# Patient Record
Sex: Female | Born: 1960 | Race: White | Hispanic: No | Marital: Married | Smoking: Current every day smoker
Health system: Southern US, Community
[De-identification: ages and names within clinical notes are randomized; demographics above are authoritative.]

## PROBLEM LIST (undated history)

## (undated) DIAGNOSIS — K5792 Diverticulitis of intestine, part unspecified, without perforation or abscess without bleeding: Secondary | ICD-10-CM

## (undated) DIAGNOSIS — J449 Chronic obstructive pulmonary disease, unspecified: Secondary | ICD-10-CM

## (undated) DIAGNOSIS — K589 Irritable bowel syndrome without diarrhea: Secondary | ICD-10-CM

## (undated) DIAGNOSIS — K859 Acute pancreatitis without necrosis or infection, unspecified: Secondary | ICD-10-CM

## (undated) DIAGNOSIS — G894 Chronic pain syndrome: Secondary | ICD-10-CM

## (undated) DIAGNOSIS — D72829 Elevated white blood cell count, unspecified: Secondary | ICD-10-CM

## (undated) DIAGNOSIS — K5720 Diverticulitis of large intestine with perforation and abscess without bleeding: Secondary | ICD-10-CM

## (undated) DIAGNOSIS — D75839 Thrombocytosis, unspecified: Secondary | ICD-10-CM

## (undated) DIAGNOSIS — I509 Heart failure, unspecified: Secondary | ICD-10-CM

## (undated) DIAGNOSIS — K029 Dental caries, unspecified: Secondary | ICD-10-CM

## (undated) DIAGNOSIS — K219 Gastro-esophageal reflux disease without esophagitis: Secondary | ICD-10-CM

## (undated) DIAGNOSIS — D473 Essential (hemorrhagic) thrombocythemia: Secondary | ICD-10-CM

## (undated) DIAGNOSIS — R1310 Dysphagia, unspecified: Secondary | ICD-10-CM

## (undated) DIAGNOSIS — Z630 Problems in relationship with spouse or partner: Secondary | ICD-10-CM

## (undated) DIAGNOSIS — R1115 Cyclical vomiting syndrome unrelated to migraine: Secondary | ICD-10-CM

## (undated) DIAGNOSIS — I10 Essential (primary) hypertension: Secondary | ICD-10-CM

## (undated) DIAGNOSIS — D649 Anemia, unspecified: Secondary | ICD-10-CM

## (undated) DIAGNOSIS — Z1211 Encounter for screening for malignant neoplasm of colon: Secondary | ICD-10-CM

## (undated) DIAGNOSIS — Z8619 Personal history of other infectious and parasitic diseases: Secondary | ICD-10-CM

## (undated) DIAGNOSIS — R109 Unspecified abdominal pain: Secondary | ICD-10-CM

## (undated) DIAGNOSIS — R3129 Other microscopic hematuria: Secondary | ICD-10-CM

## (undated) DIAGNOSIS — E876 Hypokalemia: Secondary | ICD-10-CM

## (undated) DIAGNOSIS — R000 Tachycardia, unspecified: Secondary | ICD-10-CM

## (undated) DIAGNOSIS — F419 Anxiety disorder, unspecified: Secondary | ICD-10-CM

## (undated) DIAGNOSIS — E559 Vitamin D deficiency, unspecified: Secondary | ICD-10-CM

## (undated) DIAGNOSIS — F322 Major depressive disorder, single episode, severe without psychotic features: Secondary | ICD-10-CM

## (undated) DIAGNOSIS — D125 Benign neoplasm of sigmoid colon: Secondary | ICD-10-CM

## (undated) DIAGNOSIS — J189 Pneumonia, unspecified organism: Secondary | ICD-10-CM

## (undated) DIAGNOSIS — M8580 Other specified disorders of bone density and structure, unspecified site: Secondary | ICD-10-CM

## (undated) DIAGNOSIS — Z1239 Encounter for other screening for malignant neoplasm of breast: Secondary | ICD-10-CM

## (undated) DIAGNOSIS — F331 Major depressive disorder, recurrent, moderate: Secondary | ICD-10-CM

## (undated) DIAGNOSIS — Z8701 Personal history of pneumonia (recurrent): Secondary | ICD-10-CM

## (undated) DIAGNOSIS — J019 Acute sinusitis, unspecified: Secondary | ICD-10-CM

## (undated) HISTORY — DX: Problems in relationship with spouse or partner: Z63.0

## (undated) HISTORY — DX: Diverticulitis of intestine, part unspecified, without perforation or abscess without bleeding: K57.92

## (undated) HISTORY — DX: Anemia, unspecified: D64.9

## (undated) HISTORY — DX: Personal history of other infectious and parasitic diseases: Z86.19

## (undated) HISTORY — DX: Chronic obstructive pulmonary disease, unspecified: J44.9

## (undated) HISTORY — DX: Encounter for screening for malignant neoplasm of colon: Z12.11

## (undated) HISTORY — DX: Unspecified abdominal pain: R10.9

## (undated) HISTORY — DX: Irritable bowel syndrome without diarrhea: K58.9

## (undated) HISTORY — DX: Cyclical vomiting syndrome unrelated to migraine: R11.15

## (undated) HISTORY — DX: Essential (hemorrhagic) thrombocythemia: D47.3

## (undated) HISTORY — DX: Tachycardia, unspecified: R00.0

## (undated) HISTORY — DX: Anxiety disorder, unspecified: F41.9

## (undated) HISTORY — DX: Personal history of pneumonia (recurrent): Z87.01

## (undated) HISTORY — DX: Benign neoplasm of sigmoid colon: D12.5

## (undated) HISTORY — DX: Dental caries, unspecified: K02.9

## (undated) HISTORY — DX: Thrombocytosis, unspecified: D75.839

## (undated) HISTORY — DX: Other microscopic hematuria: R31.29

## (undated) HISTORY — DX: Elevated white blood cell count, unspecified: D72.829

## (undated) HISTORY — DX: Major depressive disorder, single episode, severe without psychotic features: F32.2

## (undated) HISTORY — DX: Acute pancreatitis without necrosis or infection, unspecified: K85.90

## (undated) HISTORY — DX: Heart failure, unspecified: I50.9

## (undated) HISTORY — DX: Encounter for other screening for malignant neoplasm of breast: Z12.39

## (undated) HISTORY — DX: Other specified disorders of bone density and structure, unspecified site: M85.80

## (undated) HISTORY — DX: Chronic pain syndrome: G89.4

## (undated) HISTORY — DX: Hypokalemia: E87.6

## (undated) HISTORY — DX: Diverticulitis of large intestine with perforation and abscess without bleeding: K57.20

## (undated) HISTORY — DX: Gastro-esophageal reflux disease without esophagitis: K21.9

## (undated) HISTORY — DX: Acute sinusitis, unspecified: J01.90

## (undated) HISTORY — DX: Vitamin D deficiency, unspecified: E55.9

## (undated) HISTORY — DX: Major depressive disorder, recurrent, moderate: F33.1

## (undated) HISTORY — DX: Dysphagia, unspecified: R13.10

## (undated) HISTORY — DX: Essential (primary) hypertension: I10

---

## 2006-03-22 ENCOUNTER — Emergency Department: Payer: Self-pay

## 2006-07-02 ENCOUNTER — Ambulatory Visit: Payer: Self-pay | Admitting: Family Medicine

## 2009-04-18 ENCOUNTER — Ambulatory Visit: Payer: Self-pay | Admitting: Cardiovascular Disease

## 2009-04-18 ENCOUNTER — Inpatient Hospital Stay: Payer: Self-pay | Admitting: Student

## 2009-04-25 ENCOUNTER — Emergency Department: Payer: Self-pay | Admitting: Emergency Medicine

## 2009-12-03 ENCOUNTER — Emergency Department: Payer: Self-pay | Admitting: Emergency Medicine

## 2010-02-02 ENCOUNTER — Emergency Department: Payer: Self-pay | Admitting: Internal Medicine

## 2010-04-27 ENCOUNTER — Emergency Department: Payer: Self-pay | Admitting: Internal Medicine

## 2011-03-10 ENCOUNTER — Emergency Department: Payer: Self-pay | Admitting: Emergency Medicine

## 2011-03-11 ENCOUNTER — Emergency Department: Payer: Self-pay | Admitting: Emergency Medicine

## 2013-03-22 ENCOUNTER — Emergency Department: Payer: Self-pay | Admitting: Emergency Medicine

## 2013-08-30 ENCOUNTER — Emergency Department: Payer: Self-pay | Admitting: Internal Medicine

## 2013-12-01 ENCOUNTER — Ambulatory Visit: Payer: Self-pay | Admitting: Internal Medicine

## 2013-12-30 ENCOUNTER — Inpatient Hospital Stay: Payer: Self-pay | Admitting: Internal Medicine

## 2013-12-30 LAB — CBC
HCT: 42.2 % (ref 35.0–47.0)
HGB: 14.2 g/dL (ref 12.0–16.0)
MCH: 30.7 pg (ref 26.0–34.0)
MCHC: 33.6 g/dL (ref 32.0–36.0)
MCV: 91 fL (ref 80–100)
Platelet: 111 10*3/uL — ABNORMAL LOW (ref 150–440)
RBC: 4.62 10*6/uL (ref 3.80–5.20)
RDW: 15.8 % — ABNORMAL HIGH (ref 11.5–14.5)
WBC: 1.8 10*3/uL — CL (ref 3.6–11.0)

## 2013-12-30 LAB — COMPREHENSIVE METABOLIC PANEL
AST: 89 U/L — AB (ref 15–37)
Albumin: 2.4 g/dL — ABNORMAL LOW (ref 3.4–5.0)
Alkaline Phosphatase: 89 U/L
Anion Gap: 11 (ref 7–16)
BILIRUBIN TOTAL: 0.7 mg/dL (ref 0.2–1.0)
BUN: 52 mg/dL — ABNORMAL HIGH (ref 7–18)
CALCIUM: 8.2 mg/dL — AB (ref 8.5–10.1)
CHLORIDE: 106 mmol/L (ref 98–107)
CO2: 18 mmol/L — AB (ref 21–32)
Creatinine: 2.15 mg/dL — ABNORMAL HIGH (ref 0.60–1.30)
EGFR (African American): 29 — ABNORMAL LOW
GFR CALC NON AF AMER: 25 — AB
Glucose: 78 mg/dL (ref 65–99)
Osmolality: 283 (ref 275–301)
Potassium: 3 mmol/L — ABNORMAL LOW (ref 3.5–5.1)
SGPT (ALT): 43 U/L (ref 12–78)
Sodium: 135 mmol/L — ABNORMAL LOW (ref 136–145)
Total Protein: 6.7 g/dL (ref 6.4–8.2)

## 2013-12-30 LAB — DIFFERENTIAL
BASOS PCT: 0.3 %
Basophil #: 0 10*3/uL (ref 0.0–0.1)
Eosinophil #: 0 10*3/uL (ref 0.0–0.7)
Eosinophil %: 0.1 %
Lymphocyte #: 0 10*3/uL — ABNORMAL LOW (ref 1.0–3.6)
Lymphocyte %: 2.5 %
Monocyte #: 0 x10 3/mm — ABNORMAL LOW (ref 0.2–0.9)
Monocyte %: 0.2 %
Neutrophil #: 1.8 10*3/uL (ref 1.4–6.5)
Neutrophil %: 96.9 %

## 2013-12-30 LAB — CK TOTAL AND CKMB (NOT AT ARMC)
CK, Total: 18 U/L — ABNORMAL LOW
CK-MB: <0.5 ng/mL — ABNORMAL LOW (ref 0.5–3.6)

## 2013-12-30 LAB — TROPONIN I: Troponin-I: <0.02 ng/mL

## 2013-12-30 LAB — MAGNESIUM: MAGNESIUM: 2.5 mg/dL — AB

## 2013-12-31 LAB — FOLATE: Folic Acid: 7.8 ng/mL (ref 3.1–100.0)

## 2013-12-31 LAB — BASIC METABOLIC PANEL
Anion Gap: 5 — ABNORMAL LOW (ref 7–16)
BUN: 46 mg/dL — ABNORMAL HIGH (ref 7–18)
CREATININE: 1.6 mg/dL — AB (ref 0.60–1.30)
Calcium, Total: 7.5 mg/dL — ABNORMAL LOW (ref 8.5–10.1)
Chloride: 112 mmol/L — ABNORMAL HIGH (ref 98–107)
Co2: 21 mmol/L (ref 21–32)
GFR CALC AF AMER: 42 — AB
GFR CALC NON AF AMER: 36 — AB
GLUCOSE: 48 mg/dL — AB (ref 65–99)
OSMOLALITY: 285 (ref 275–301)
Potassium: 3.8 mmol/L (ref 3.5–5.1)
SODIUM: 138 mmol/L (ref 136–145)

## 2013-12-31 LAB — CBC WITH DIFFERENTIAL/PLATELET
HCT: 41.3 % (ref 35.0–47.0)
HGB: 13.5 g/dL (ref 12.0–16.0)
LYMPHS PCT: 10 %
MCH: 30.4 pg (ref 26.0–34.0)
MCHC: 32.6 g/dL (ref 32.0–36.0)
MCV: 93 fL (ref 80–100)
Monocytes: 3 %
PLATELETS: 81 10*3/uL — AB (ref 150–440)
RBC: 4.44 10*6/uL (ref 3.80–5.20)
RDW: 16 % — ABNORMAL HIGH (ref 11.5–14.5)
SEGMENTED NEUTROPHILS: 87 %
WBC: 3 10*3/uL — AB (ref 3.6–11.0)

## 2013-12-31 LAB — RETICULOCYTES
Absolute Retic Count: 0.0406 10*6/uL (ref 0.019–0.186)
RETICULOCYTE: 0.91 % (ref 0.4–3.1)

## 2013-12-31 LAB — APTT: Activated PTT: 30.4 secs (ref 23.6–35.9)

## 2013-12-31 LAB — MAGNESIUM: Magnesium: 2.4 mg/dL

## 2013-12-31 LAB — IRON AND TIBC
IRON BIND. CAP.(TOTAL): 182 ug/dL — AB (ref 250–450)
IRON SATURATION: 7 %
Iron: 12 ug/dL — ABNORMAL LOW (ref 50–170)
UNBOUND IRON-BIND. CAP.: 170 ug/dL

## 2013-12-31 LAB — FIBRINOGEN

## 2013-12-31 LAB — PROTIME-INR
INR: 1.1
Prothrombin Time: 14.5 secs (ref 11.5–14.7)

## 2013-12-31 LAB — LACTATE DEHYDROGENASE: LDH: 258 U/L — ABNORMAL HIGH (ref 81–246)

## 2014-01-01 ENCOUNTER — Ambulatory Visit: Payer: Self-pay | Admitting: Internal Medicine

## 2014-01-01 LAB — CBC WITH DIFFERENTIAL/PLATELET
BASOS ABS: 0 10*3/uL (ref 0.0–0.1)
BASOS ABS: 0 10*3/uL (ref 0.0–0.1)
BASOS PCT: 0.1 %
Basophil %: 0.1 %
EOS ABS: 0 10*3/uL (ref 0.0–0.7)
EOS PCT: 0 %
Eosinophil #: 0 10*3/uL (ref 0.0–0.7)
Eosinophil %: 0 %
HCT: 36.1 % (ref 35.0–47.0)
HCT: 37.8 % (ref 35.0–47.0)
HGB: 12 g/dL (ref 12.0–16.0)
HGB: 12.7 g/dL (ref 12.0–16.0)
LYMPHS ABS: 0.1 10*3/uL — AB (ref 1.0–3.6)
LYMPHS ABS: 0.1 10*3/uL — AB (ref 1.0–3.6)
Lymphocyte %: 0.4 %
Lymphocyte %: 0.6 %
MCH: 31.1 pg (ref 26.0–34.0)
MCH: 30.9 pg (ref 26.0–34.0)
MCHC: 33.3 g/dL (ref 32.0–36.0)
MCHC: 33.5 g/dL (ref 32.0–36.0)
MCV: 93 fL (ref 80–100)
MCV: 92 fL (ref 80–100)
MONOS PCT: 0.8 %
MONOS PCT: 0.6 %
Monocyte #: 0.1 x10 3/mm — ABNORMAL LOW (ref 0.2–0.9)
Monocyte #: 0.1 x10 3/mm — ABNORMAL LOW (ref 0.2–0.9)
NEUTROS ABS: 18.8 10*3/uL — AB (ref 1.4–6.5)
Neutrophil #: 15.7 10*3/uL — ABNORMAL HIGH (ref 1.4–6.5)
Neutrophil %: 98.7 %
Neutrophil %: 98.7 %
PLATELETS: 47 10*3/uL — AB (ref 150–440)
PLATELETS: 51 10*3/uL — AB (ref 150–440)
RBC: 3.87 10*6/uL (ref 3.80–5.20)
RBC: 4.1 10*6/uL (ref 3.80–5.20)
RDW: 16.4 % — ABNORMAL HIGH (ref 11.5–14.5)
RDW: 16.1 % — ABNORMAL HIGH (ref 11.5–14.5)
WBC: 15.9 10*3/uL — AB (ref 3.6–11.0)
WBC: 19 10*3/uL — ABNORMAL HIGH (ref 3.6–11.0)

## 2014-01-01 LAB — BASIC METABOLIC PANEL
Anion Gap: 7 (ref 7–16)
BUN: 39 mg/dL — AB (ref 7–18)
CALCIUM: 7.7 mg/dL — AB (ref 8.5–10.1)
CHLORIDE: 114 mmol/L — AB (ref 98–107)
CO2: 19 mmol/L — AB (ref 21–32)
CREATININE: 1.26 mg/dL (ref 0.60–1.30)
EGFR (Non-African Amer.): 48 — ABNORMAL LOW
GFR CALC AF AMER: 56 — AB
GLUCOSE: 120 mg/dL — AB (ref 65–99)
OSMOLALITY: 290 (ref 275–301)
POTASSIUM: 2.9 mmol/L — AB (ref 3.5–5.1)
Sodium: 140 mmol/L (ref 136–145)

## 2014-01-01 LAB — POTASSIUM: Potassium: 3.5 mmol/L (ref 3.5–5.1)

## 2014-01-02 LAB — BASIC METABOLIC PANEL
Anion Gap: 11 (ref 7–16)
Anion Gap: 14 (ref 7–16)
BUN: 43 mg/dL — ABNORMAL HIGH (ref 7–18)
BUN: 52 mg/dL — ABNORMAL HIGH (ref 7–18)
CALCIUM: 8.5 mg/dL (ref 8.5–10.1)
CHLORIDE: 105 mmol/L (ref 98–107)
Calcium, Total: 7.8 mg/dL — ABNORMAL LOW (ref 8.5–10.1)
Chloride: 111 mmol/L — ABNORMAL HIGH (ref 98–107)
Co2: 17 mmol/L — ABNORMAL LOW (ref 21–32)
Co2: 19 mmol/L — ABNORMAL LOW (ref 21–32)
Creatinine: 1.31 mg/dL — ABNORMAL HIGH (ref 0.60–1.30)
Creatinine: 1.22 mg/dL (ref 0.60–1.30)
EGFR (African American): 53 — ABNORMAL LOW
EGFR (African American): 58 — ABNORMAL LOW
EGFR (Non-African Amer.): 46 — ABNORMAL LOW
EGFR (Non-African Amer.): 50 — ABNORMAL LOW
GLUCOSE: 57 mg/dL — AB (ref 65–99)
GLUCOSE: 200 mg/dL — AB (ref 65–99)
OSMOLALITY: 286 (ref 275–301)
Osmolality: 295 (ref 275–301)
POTASSIUM: 2.5 mmol/L — AB (ref 3.5–5.1)
Potassium: 3.3 mmol/L — ABNORMAL LOW (ref 3.5–5.1)
Sodium: 139 mmol/L (ref 136–145)
Sodium: 138 mmol/L (ref 136–145)

## 2014-01-02 LAB — WBC: WBC: 24.5 10*3/uL — ABNORMAL HIGH (ref 3.6–11.0)

## 2014-01-02 LAB — POTASSIUM: POTASSIUM: 2.6 mmol/L — AB (ref 3.5–5.1)

## 2014-01-02 LAB — CULTURE, BLOOD (SINGLE)

## 2014-01-02 LAB — PLATELET COUNT: PLATELETS: 54 10*3/uL — AB (ref 150–440)

## 2014-01-03 LAB — FIBRIN DEGRADATION PROD.(ARMC ONLY)

## 2014-01-03 LAB — CBC WITH DIFFERENTIAL/PLATELET
BASOS ABS: 0 10*3/uL (ref 0.0–0.1)
BASOS ABS: 0 10*3/uL (ref 0.0–0.1)
BASOS PCT: 0.1 %
Basophil %: 0.2 %
EOS ABS: 0 10*3/uL (ref 0.0–0.7)
Eosinophil #: 0 10*3/uL (ref 0.0–0.7)
Eosinophil %: 0 %
Eosinophil %: 0 %
HCT: 30.3 % — ABNORMAL LOW (ref 35.0–47.0)
HCT: 28.4 % — AB (ref 35.0–47.0)
HGB: 10.4 g/dL — ABNORMAL LOW (ref 12.0–16.0)
HGB: 9.4 g/dL — ABNORMAL LOW (ref 12.0–16.0)
Lymphocyte #: 0.2 10*3/uL — ABNORMAL LOW (ref 1.0–3.6)
Lymphocyte #: 0.2 10*3/uL — ABNORMAL LOW (ref 1.0–3.6)
Lymphocyte %: 0.6 %
Lymphocyte %: 0.7 %
MCH: 30.6 pg (ref 26.0–34.0)
MCH: 29.8 pg (ref 26.0–34.0)
MCHC: 34.2 g/dL (ref 32.0–36.0)
MCHC: 33.2 g/dL (ref 32.0–36.0)
MCV: 89 fL (ref 80–100)
MCV: 90 fL (ref 80–100)
MONOS PCT: 0.6 %
MONOS PCT: 1 %
Monocyte #: 0.2 x10 3/mm (ref 0.2–0.9)
Monocyte #: 0.3 x10 3/mm (ref 0.2–0.9)
NEUTROS ABS: 31.2 10*3/uL — AB (ref 1.4–6.5)
NEUTROS ABS: 25.4 10*3/uL — AB (ref 1.4–6.5)
Neutrophil %: 98.7 %
Neutrophil %: 98.1 %
PLATELETS: 29 10*3/uL — AB (ref 150–440)
Platelet: 22 10*3/uL — CL (ref 150–440)
RBC: 3.39 10*6/uL — AB (ref 3.80–5.20)
RBC: 3.17 10*6/uL — ABNORMAL LOW (ref 3.80–5.20)
RDW: 16.5 % — ABNORMAL HIGH (ref 11.5–14.5)
RDW: 16.1 % — ABNORMAL HIGH (ref 11.5–14.5)
WBC: 31.6 10*3/uL — ABNORMAL HIGH (ref 3.6–11.0)
WBC: 25.9 10*3/uL — ABNORMAL HIGH (ref 3.6–11.0)

## 2014-01-03 LAB — PHOSPHORUS
Phosphorus: 2.1 mg/dL — ABNORMAL LOW (ref 2.5–4.9)
Phosphorus: 2.6 mg/dL (ref 2.5–4.9)

## 2014-01-03 LAB — VANCOMYCIN, TROUGH: VANCOMYCIN, TROUGH: 8 ug/mL — AB (ref 10–20)

## 2014-01-03 LAB — POTASSIUM: Potassium: 3.2 mmol/L — ABNORMAL LOW (ref 3.5–5.1)

## 2014-01-03 LAB — BASIC METABOLIC PANEL
Anion Gap: 6 — ABNORMAL LOW (ref 7–16)
BUN: 47 mg/dL — ABNORMAL HIGH (ref 7–18)
CHLORIDE: 103 mmol/L (ref 98–107)
Calcium, Total: 7.7 mg/dL — ABNORMAL LOW (ref 8.5–10.1)
Co2: 29 mmol/L (ref 21–32)
Creatinine: 1.36 mg/dL — ABNORMAL HIGH (ref 0.60–1.30)
GFR CALC AF AMER: 51 — AB
GFR CALC NON AF AMER: 44 — AB
Glucose: 266 mg/dL — ABNORMAL HIGH (ref 65–99)
Osmolality: 297 (ref 275–301)
Potassium: 2.8 mmol/L — ABNORMAL LOW (ref 3.5–5.1)
Sodium: 138 mmol/L (ref 136–145)

## 2014-01-03 LAB — D-DIMER(ARMC): D-Dimer: 3494 ng/ml

## 2014-01-03 LAB — FIBRINOGEN
Fibrinogen: >750 mg/dL — ABNORMAL HIGH (ref 210–470)
Fibrinogen: >750 mg/dL — ABNORMAL HIGH (ref 210–470)

## 2014-01-03 LAB — APTT: ACTIVATED PTT: 25.4 s (ref 23.6–35.9)

## 2014-01-03 LAB — MAGNESIUM
MAGNESIUM: 1.7 mg/dL — AB
Magnesium: 2.2 mg/dL

## 2014-01-03 LAB — PROTIME-INR
INR: 1.2
Prothrombin Time: 15.3 secs — ABNORMAL HIGH (ref 11.5–14.7)

## 2014-01-03 LAB — PROT IMMUNOELECTROPHORES(ARMC)

## 2014-01-04 LAB — CBC WITH DIFFERENTIAL/PLATELET
BASOS ABS: 0 10*3/uL (ref 0.0–0.1)
Basophil %: 0.1 %
EOS ABS: 0 10*3/uL (ref 0.0–0.7)
Eosinophil %: 0 %
HCT: 28.4 % — ABNORMAL LOW (ref 35.0–47.0)
HGB: 9.3 g/dL — ABNORMAL LOW (ref 12.0–16.0)
LYMPHS ABS: 0.3 10*3/uL — AB (ref 1.0–3.6)
Lymphocyte %: 1.2 %
MCH: 29.5 pg (ref 26.0–34.0)
MCHC: 32.6 g/dL (ref 32.0–36.0)
MCV: 91 fL (ref 80–100)
MONO ABS: 0.4 x10 3/mm (ref 0.2–0.9)
Monocyte %: 1.5 %
Neutrophil #: 23 10*3/uL — ABNORMAL HIGH (ref 1.4–6.5)
Neutrophil %: 97.2 %
Platelet: 32 10*3/uL — ABNORMAL LOW (ref 150–440)
RBC: 3.14 10*6/uL — ABNORMAL LOW (ref 3.80–5.20)
RDW: 16.2 % — AB (ref 11.5–14.5)
WBC: 23.7 10*3/uL — ABNORMAL HIGH (ref 3.6–11.0)

## 2014-01-04 LAB — CULTURE, BLOOD (SINGLE)

## 2014-01-04 LAB — BASIC METABOLIC PANEL
Anion Gap: 5 — ABNORMAL LOW (ref 7–16)
BUN: 31 mg/dL — ABNORMAL HIGH (ref 7–18)
CREATININE: 0.91 mg/dL (ref 0.60–1.30)
Calcium, Total: 7.5 mg/dL — ABNORMAL LOW (ref 8.5–10.1)
Chloride: 94 mmol/L — ABNORMAL LOW (ref 98–107)
Co2: 38 mmol/L — ABNORMAL HIGH (ref 21–32)
EGFR (African American): >60
EGFR (Non-African Amer.): >60
Glucose: 171 mg/dL — ABNORMAL HIGH (ref 65–99)
OSMOLALITY: 284 (ref 275–301)
Potassium: 3.5 mmol/L (ref 3.5–5.1)
Sodium: 137 mmol/L (ref 136–145)

## 2014-01-04 LAB — PHOSPHORUS: PHOSPHORUS: 2.7 mg/dL (ref 2.5–4.9)

## 2014-01-05 LAB — BASIC METABOLIC PANEL
ANION GAP: 1 — AB (ref 7–16)
BUN: 28 mg/dL — AB (ref 7–18)
CHLORIDE: 100 mmol/L (ref 98–107)
CO2: 39 mmol/L — AB (ref 21–32)
Calcium, Total: 7.4 mg/dL — ABNORMAL LOW (ref 8.5–10.1)
Creatinine: 0.85 mg/dL (ref 0.60–1.30)
EGFR (Non-African Amer.): >60
GLUCOSE: 118 mg/dL — AB (ref 65–99)
OSMOLALITY: 286 (ref 275–301)
Potassium: 3.5 mmol/L (ref 3.5–5.1)
Sodium: 140 mmol/L (ref 136–145)

## 2014-01-05 LAB — CBC WITH DIFFERENTIAL/PLATELET
Bands: 4 %
HCT: 26 % — AB (ref 35.0–47.0)
HGB: 8.8 g/dL — ABNORMAL LOW (ref 12.0–16.0)
LYMPHS PCT: 6 %
MCH: 30.6 pg (ref 26.0–34.0)
MCHC: 33.8 g/dL (ref 32.0–36.0)
MCV: 90 fL (ref 80–100)
METAMYELOCYTE: 1 %
Monocytes: 3 %
Platelet: 63 10*3/uL — ABNORMAL LOW (ref 150–440)
RBC: 2.88 10*6/uL — AB (ref 3.80–5.20)
RDW: 16.2 % — ABNORMAL HIGH (ref 11.5–14.5)
Segmented Neutrophils: 86 %
WBC: 16.8 10*3/uL — AB (ref 3.6–11.0)

## 2014-01-06 LAB — CBC WITH DIFFERENTIAL/PLATELET
BANDS NEUTROPHIL: 1 %
HCT: 25.8 % — ABNORMAL LOW (ref 35.0–47.0)
HGB: 8.5 g/dL — ABNORMAL LOW (ref 12.0–16.0)
LYMPHS PCT: 2 %
MCH: 30.1 pg (ref 26.0–34.0)
MCHC: 32.9 g/dL (ref 32.0–36.0)
MCV: 91 fL (ref 80–100)
Metamyelocyte: 1 %
Monocytes: 4 %
Platelet: 124 10*3/uL — ABNORMAL LOW (ref 150–440)
RBC: 2.82 10*6/uL — ABNORMAL LOW (ref 3.80–5.20)
RDW: 16.3 % — ABNORMAL HIGH (ref 11.5–14.5)
SEGMENTED NEUTROPHILS: 92 %
WBC: 17.3 10*3/uL — AB (ref 3.6–11.0)

## 2014-01-06 LAB — PHOSPHORUS: PHOSPHORUS: 2.7 mg/dL (ref 2.5–4.9)

## 2014-01-06 LAB — URINALYSIS, COMPLETE
BACTERIA: NONE SEEN
BLOOD: NEGATIVE
Bilirubin,UR: NEGATIVE
GLUCOSE, UR: NEGATIVE mg/dL (ref 0–75)
Ketone: NEGATIVE
LEUKOCYTE ESTERASE: NEGATIVE
Nitrite: NEGATIVE
PH: 7 (ref 4.5–8.0)
Protein: NEGATIVE
SPECIFIC GRAVITY: 1.016 (ref 1.003–1.030)
SQUAMOUS EPITHELIAL: NONE SEEN

## 2014-01-06 LAB — MAGNESIUM: Magnesium: 1.9 mg/dL

## 2014-01-06 LAB — POTASSIUM: Potassium: 3.8 mmol/L (ref 3.5–5.1)

## 2014-01-07 LAB — CBC WITH DIFFERENTIAL/PLATELET
BASOS PCT: 0.1 %
Basophil #: 0 10*3/uL (ref 0.0–0.1)
EOS PCT: 0.1 %
Eosinophil #: 0 10*3/uL (ref 0.0–0.7)
HCT: 24 % — ABNORMAL LOW (ref 35.0–47.0)
HGB: 7.5 g/dL — AB (ref 12.0–16.0)
LYMPHS ABS: 0.6 10*3/uL — AB (ref 1.0–3.6)
Lymphocyte %: 2.7 %
MCH: 29.2 pg (ref 26.0–34.0)
MCHC: 31.5 g/dL — AB (ref 32.0–36.0)
MCV: 93 fL (ref 80–100)
MONO ABS: 0.8 x10 3/mm (ref 0.2–0.9)
MONOS PCT: 3.7 %
NEUTROS ABS: 19.1 10*3/uL — AB (ref 1.4–6.5)
Neutrophil %: 93.4 %
Platelet: 188 10*3/uL (ref 150–440)
RBC: 2.58 10*6/uL — ABNORMAL LOW (ref 3.80–5.20)
RDW: 16.4 % — ABNORMAL HIGH (ref 11.5–14.5)
WBC: 20.5 10*3/uL — AB (ref 3.6–11.0)

## 2014-01-07 LAB — BASIC METABOLIC PANEL
Anion Gap: 3 — ABNORMAL LOW (ref 7–16)
BUN: 28 mg/dL — ABNORMAL HIGH (ref 7–18)
Calcium, Total: 7.6 mg/dL — ABNORMAL LOW (ref 8.5–10.1)
Chloride: 105 mmol/L (ref 98–107)
Co2: 36 mmol/L — ABNORMAL HIGH (ref 21–32)
Creatinine: 0.86 mg/dL (ref 0.60–1.30)
EGFR (African American): >60
EGFR (Non-African Amer.): >60
Glucose: 112 mg/dL — ABNORMAL HIGH (ref 65–99)
Osmolality: 293 (ref 275–301)
Potassium: 3.4 mmol/L — ABNORMAL LOW (ref 3.5–5.1)
Sodium: 144 mmol/L (ref 136–145)

## 2014-01-07 LAB — HEPATIC FUNCTION PANEL A (ARMC)
Albumin: 1.6 g/dL — ABNORMAL LOW (ref 3.4–5.0)
Alkaline Phosphatase: 76 U/L
Bilirubin, Direct: 0.3 mg/dL — ABNORMAL HIGH (ref 0.00–0.20)
Bilirubin,Total: 0.8 mg/dL (ref 0.2–1.0)
SGOT(AST): 20 U/L (ref 15–37)
SGPT (ALT): 19 U/L (ref 12–78)
TOTAL PROTEIN: 4.5 g/dL — AB (ref 6.4–8.2)

## 2014-01-07 LAB — LIPASE, BLOOD: Lipase: 69 U/L — ABNORMAL LOW (ref 73–393)

## 2014-01-07 LAB — PHOSPHORUS: Phosphorus: 2.7 mg/dL (ref 2.5–4.9)

## 2014-01-07 LAB — POTASSIUM: POTASSIUM: 3.9 mmol/L (ref 3.5–5.1)

## 2014-01-07 LAB — URINE CULTURE

## 2014-01-07 LAB — LACTATE DEHYDROGENASE: LDH: 421 U/L — AB (ref 81–246)

## 2014-01-07 LAB — MAGNESIUM: Magnesium: 1.8 mg/dL

## 2014-01-08 LAB — PHOSPHORUS: Phosphorus: 2.5 mg/dL (ref 2.5–4.9)

## 2014-01-08 LAB — CBC WITH DIFFERENTIAL/PLATELET
Basophil #: 0 10*3/uL (ref 0.0–0.1)
Basophil %: 0 %
EOS PCT: 0.4 %
Eosinophil #: 0.1 10*3/uL (ref 0.0–0.7)
HCT: 22 % — AB (ref 35.0–47.0)
HGB: 7.1 g/dL — ABNORMAL LOW (ref 12.0–16.0)
Lymphocyte #: 0.7 10*3/uL — ABNORMAL LOW (ref 1.0–3.6)
Lymphocyte %: 4.3 %
MCH: 29.9 pg (ref 26.0–34.0)
MCHC: 32.1 g/dL (ref 32.0–36.0)
MCV: 93 fL (ref 80–100)
Monocyte #: 0.4 x10 3/mm (ref 0.2–0.9)
Monocyte %: 2.7 %
Neutrophil #: 14.3 10*3/uL — ABNORMAL HIGH (ref 1.4–6.5)
Neutrophil %: 92.6 %
Platelet: 191 10*3/uL (ref 150–440)
RBC: 2.36 10*6/uL — ABNORMAL LOW (ref 3.80–5.20)
RDW: 16.2 % — ABNORMAL HIGH (ref 11.5–14.5)
WBC: 15.4 10*3/uL — AB (ref 3.6–11.0)

## 2014-01-08 LAB — BASIC METABOLIC PANEL
Anion Gap: 5 — ABNORMAL LOW (ref 7–16)
BUN: 18 mg/dL (ref 7–18)
Calcium, Total: 7.1 mg/dL — ABNORMAL LOW (ref 8.5–10.1)
Chloride: 105 mmol/L (ref 98–107)
Co2: 32 mmol/L (ref 21–32)
Creatinine: 0.39 mg/dL — ABNORMAL LOW (ref 0.60–1.30)
EGFR (African American): >60
EGFR (Non-African Amer.): >60
Glucose: 122 mg/dL — ABNORMAL HIGH (ref 65–99)
Osmolality: 286 (ref 275–301)
Potassium: 3.3 mmol/L — ABNORMAL LOW (ref 3.5–5.1)
Sodium: 142 mmol/L (ref 136–145)

## 2014-01-08 LAB — POTASSIUM: Potassium: 3.6 mmol/L (ref 3.5–5.1)

## 2014-01-08 LAB — OCCULT BLOOD X 1 CARD TO LAB, STOOL: Occult Blood, Feces: NEGATIVE

## 2014-01-08 LAB — VANCOMYCIN, TROUGH: VANCOMYCIN, TROUGH: 14 ug/mL (ref 10–20)

## 2014-01-08 LAB — LIPASE, BLOOD: Lipase: 44 U/L — ABNORMAL LOW (ref 73–393)

## 2014-01-09 DIAGNOSIS — I519 Heart disease, unspecified: Secondary | ICD-10-CM

## 2014-01-09 LAB — BASIC METABOLIC PANEL
Anion Gap: 4 — ABNORMAL LOW (ref 7–16)
BUN: 14 mg/dL (ref 7–18)
Calcium, Total: 7.1 mg/dL — ABNORMAL LOW (ref 8.5–10.1)
Chloride: 108 mmol/L — ABNORMAL HIGH (ref 98–107)
Co2: 31 mmol/L (ref 21–32)
Creatinine: 0.36 mg/dL — ABNORMAL LOW (ref 0.60–1.30)
EGFR (African American): >60
EGFR (Non-African Amer.): >60
Glucose: 106 mg/dL — ABNORMAL HIGH (ref 65–99)
Osmolality: 286 (ref 275–301)
POTASSIUM: 3.7 mmol/L (ref 3.5–5.1)
Sodium: 143 mmol/L (ref 136–145)

## 2014-01-09 LAB — CBC WITH DIFFERENTIAL/PLATELET
BASOS PCT: 0.1 %
Basophil #: 0 10*3/uL (ref 0.0–0.1)
EOS ABS: 0 10*3/uL (ref 0.0–0.7)
Eosinophil %: 0.2 %
HCT: 25.7 % — AB (ref 35.0–47.0)
HGB: 8.4 g/dL — AB (ref 12.0–16.0)
Lymphocyte #: 0.3 10*3/uL — ABNORMAL LOW (ref 1.0–3.6)
Lymphocyte %: 1.8 %
MCH: 29.6 pg (ref 26.0–34.0)
MCHC: 32.5 g/dL (ref 32.0–36.0)
MCV: 91 fL (ref 80–100)
MONO ABS: 0.4 x10 3/mm (ref 0.2–0.9)
MONOS PCT: 2.3 %
Neutrophil #: 15.7 10*3/uL — ABNORMAL HIGH (ref 1.4–6.5)
Neutrophil %: 95.6 %
Platelet: 218 10*3/uL (ref 150–440)
RBC: 2.83 10*6/uL — ABNORMAL LOW (ref 3.80–5.20)
RDW: 16.7 % — AB (ref 11.5–14.5)
WBC: 16.4 10*3/uL — ABNORMAL HIGH (ref 3.6–11.0)

## 2014-01-09 LAB — EXPECTORATED SPUTUM ASSESSMENT W REFEX TO RESP CULTURE

## 2014-01-10 LAB — CBC WITH DIFFERENTIAL/PLATELET
BASOS ABS: 0 10*3/uL (ref 0.0–0.1)
Basophil %: 0.1 %
EOS ABS: 0.1 10*3/uL (ref 0.0–0.7)
EOS PCT: 0.4 %
HCT: 26.6 % — ABNORMAL LOW (ref 35.0–47.0)
HGB: 8.6 g/dL — ABNORMAL LOW (ref 12.0–16.0)
Lymphocyte #: 0.8 10*3/uL — ABNORMAL LOW (ref 1.0–3.6)
Lymphocyte %: 3.7 %
MCH: 29.8 pg (ref 26.0–34.0)
MCHC: 32.5 g/dL (ref 32.0–36.0)
MCV: 92 fL (ref 80–100)
Monocyte #: 0.6 x10 3/mm (ref 0.2–0.9)
Monocyte %: 3 %
Neutrophil #: 19.2 10*3/uL — ABNORMAL HIGH (ref 1.4–6.5)
Neutrophil %: 92.8 %
Platelet: 345 10*3/uL (ref 150–440)
RBC: 2.9 10*6/uL — AB (ref 3.80–5.20)
RDW: 16.9 % — ABNORMAL HIGH (ref 11.5–14.5)
WBC: 20.7 10*3/uL — ABNORMAL HIGH (ref 3.6–11.0)

## 2014-01-10 LAB — BASIC METABOLIC PANEL
Anion Gap: 5 — ABNORMAL LOW (ref 7–16)
BUN: 11 mg/dL (ref 7–18)
CHLORIDE: 109 mmol/L — AB (ref 98–107)
CO2: 30 mmol/L (ref 21–32)
CREATININE: 0.55 mg/dL — AB (ref 0.60–1.30)
Calcium, Total: 7.4 mg/dL — ABNORMAL LOW (ref 8.5–10.1)
GLUCOSE: 96 mg/dL (ref 65–99)
Osmolality: 286 (ref 275–301)
POTASSIUM: 3 mmol/L — AB (ref 3.5–5.1)
SODIUM: 144 mmol/L (ref 136–145)

## 2014-01-10 LAB — POTASSIUM: POTASSIUM: 4.2 mmol/L (ref 3.5–5.1)

## 2014-01-10 LAB — CANCER ANTIGEN 19-9: CA 19-9: <1 U/mL (ref 0–35)

## 2014-01-11 LAB — BASIC METABOLIC PANEL
Anion Gap: 5 — ABNORMAL LOW (ref 7–16)
BUN: 12 mg/dL (ref 7–18)
CALCIUM: 7.8 mg/dL — AB (ref 8.5–10.1)
CHLORIDE: 107 mmol/L (ref 98–107)
CO2: 30 mmol/L (ref 21–32)
Creatinine: 0.41 mg/dL — ABNORMAL LOW (ref 0.60–1.30)
EGFR (African American): >60
EGFR (Non-African Amer.): >60
Glucose: 84 mg/dL (ref 65–99)
OSMOLALITY: 282 (ref 275–301)
POTASSIUM: 3.5 mmol/L (ref 3.5–5.1)
SODIUM: 142 mmol/L (ref 136–145)

## 2014-01-11 LAB — MAGNESIUM
MAGNESIUM: 1.4 mg/dL — AB
MAGNESIUM: 1.8 mg/dL

## 2014-01-11 LAB — CBC WITH DIFFERENTIAL/PLATELET
Basophil #: 0.1 10*3/uL (ref 0.0–0.1)
Basophil %: 0.3 %
Eosinophil #: 0.1 10*3/uL (ref 0.0–0.7)
Eosinophil %: 0.5 %
HCT: 27.2 % — ABNORMAL LOW (ref 35.0–47.0)
HGB: 8.8 g/dL — ABNORMAL LOW (ref 12.0–16.0)
LYMPHS PCT: 4.5 %
Lymphocyte #: 1 10*3/uL (ref 1.0–3.6)
MCH: 29.9 pg (ref 26.0–34.0)
MCHC: 32.4 g/dL (ref 32.0–36.0)
MCV: 92 fL (ref 80–100)
MONOS PCT: 2.7 %
Monocyte #: 0.6 x10 3/mm (ref 0.2–0.9)
Neutrophil #: 20.4 10*3/uL — ABNORMAL HIGH (ref 1.4–6.5)
Neutrophil %: 92 %
PLATELETS: 374 10*3/uL (ref 150–440)
RBC: 2.94 10*6/uL — AB (ref 3.80–5.20)
RDW: 16.3 % — ABNORMAL HIGH (ref 11.5–14.5)
WBC: 22.1 10*3/uL — AB (ref 3.6–11.0)

## 2014-01-11 LAB — CULTURE, BLOOD (SINGLE)

## 2014-01-11 LAB — PHOSPHORUS: PHOSPHORUS: 2.7 mg/dL (ref 2.5–4.9)

## 2014-01-12 LAB — TPN PANEL
ALBUMIN: 1.8 g/dL — AB (ref 3.4–5.0)
ALK PHOS: 198 U/L — AB
ANION GAP: 5 — AB (ref 7–16)
Activated PTT: 26.5 secs (ref 23.6–35.9)
BUN: 12 mg/dL (ref 7–18)
CHOLESTEROL: 154 mg/dL (ref 0–200)
CREATININE: 0.55 mg/dL — AB (ref 0.60–1.30)
Calcium, Total: 7.9 mg/dL — ABNORMAL LOW (ref 8.5–10.1)
Chloride: 102 mmol/L (ref 98–107)
Co2: 35 mmol/L — ABNORMAL HIGH (ref 21–32)
EGFR (African American): >60
GLUCOSE: 127 mg/dL — AB (ref 65–99)
HGB: 9.5 g/dL — ABNORMAL LOW (ref 12.0–16.0)
INR: 1.1
Magnesium: 1.5 mg/dL — ABNORMAL LOW
Osmolality: 284 (ref 275–301)
PHOSPHORUS: 2.4 mg/dL — AB (ref 2.5–4.9)
POTASSIUM: 2.6 mmol/L — AB (ref 3.5–5.1)
Platelet: 434 10*3/uL (ref 150–440)
Prothrombin Time: 13.8 secs (ref 11.5–14.7)
SGOT(AST): 68 U/L — ABNORMAL HIGH (ref 15–37)
Sodium: 142 mmol/L (ref 136–145)
TOTAL PROTEIN: 4.9 g/dL — AB (ref 6.4–8.2)
TRIGLYCERIDES: 238 mg/dL — AB (ref 0–200)
WBC: 31 10*3/uL — ABNORMAL HIGH (ref 3.6–11.0)

## 2014-01-12 LAB — CULTURE, BLOOD (SINGLE)

## 2014-01-12 LAB — AEROBIC CULTURE

## 2014-01-12 LAB — AMMONIA: Ammonia, Plasma: 10 mcmol/L — ABNORMAL LOW (ref 11–32)

## 2014-01-13 LAB — BASIC METABOLIC PANEL
Anion Gap: 5 — ABNORMAL LOW (ref 7–16)
BUN: 12 mg/dL (ref 7–18)
CALCIUM: 8.3 mg/dL — AB (ref 8.5–10.1)
CHLORIDE: 97 mmol/L — AB (ref 98–107)
CREATININE: 0.38 mg/dL — AB (ref 0.60–1.30)
Co2: 38 mmol/L — ABNORMAL HIGH (ref 21–32)
EGFR (African American): >60
GLUCOSE: 119 mg/dL — AB (ref 65–99)
Osmolality: 280 (ref 275–301)
POTASSIUM: 2 mmol/L — AB (ref 3.5–5.1)
Sodium: 140 mmol/L (ref 136–145)

## 2014-01-13 LAB — CBC WITH DIFFERENTIAL/PLATELET
BASOS ABS: 0.2 10*3/uL — AB (ref 0.0–0.1)
BASOS PCT: 0.8 %
Eosinophil #: 0.1 10*3/uL (ref 0.0–0.7)
Eosinophil %: 0.3 %
HCT: 25.4 % — AB (ref 35.0–47.0)
HGB: 8.5 g/dL — AB (ref 12.0–16.0)
LYMPHS PCT: 2.4 %
Lymphocyte #: 0.7 10*3/uL — ABNORMAL LOW (ref 1.0–3.6)
MCH: 30.7 pg (ref 26.0–34.0)
MCHC: 33.6 g/dL (ref 32.0–36.0)
MCV: 91 fL (ref 80–100)
MONO ABS: 1.1 x10 3/mm — AB (ref 0.2–0.9)
MONOS PCT: 3.6 %
NEUTROS ABS: 29.2 10*3/uL — AB (ref 1.4–6.5)
Neutrophil %: 92.9 %
Platelet: 351 10*3/uL (ref 150–440)
RBC: 2.78 10*6/uL — ABNORMAL LOW (ref 3.80–5.20)
RDW: 15.9 % — ABNORMAL HIGH (ref 11.5–14.5)
WBC: 31.4 10*3/uL — ABNORMAL HIGH (ref 3.6–11.0)

## 2014-01-13 LAB — PHOSPHORUS: Phosphorus: 2.3 mg/dL — ABNORMAL LOW (ref 2.5–4.9)

## 2014-01-13 LAB — MAGNESIUM: MAGNESIUM: 1.5 mg/dL — AB

## 2014-01-14 LAB — CBC WITH DIFFERENTIAL/PLATELET
Basophil #: 0.2 10*3/uL — ABNORMAL HIGH (ref 0.0–0.1)
Basophil %: 0.7 %
Eosinophil #: 0.2 10*3/uL (ref 0.0–0.7)
Eosinophil %: 1 %
HCT: 24.8 % — ABNORMAL LOW (ref 35.0–47.0)
HGB: 8.2 g/dL — ABNORMAL LOW (ref 12.0–16.0)
Lymphocyte #: 0.8 10*3/uL — ABNORMAL LOW (ref 1.0–3.6)
Lymphocyte %: 3.2 %
MCH: 30.5 pg (ref 26.0–34.0)
MCHC: 33 g/dL (ref 32.0–36.0)
MCV: 92 fL (ref 80–100)
Monocyte #: 0.7 x10 3/mm (ref 0.2–0.9)
Monocyte %: 2.8 %
Neutrophil #: 23.4 10*3/uL — ABNORMAL HIGH (ref 1.4–6.5)
Neutrophil %: 92.3 %
Platelet: 324 10*3/uL (ref 150–440)
RBC: 2.68 10*6/uL — ABNORMAL LOW (ref 3.80–5.20)
RDW: 16.6 % — ABNORMAL HIGH (ref 11.5–14.5)
WBC: 25.3 10*3/uL — ABNORMAL HIGH (ref 3.6–11.0)

## 2014-01-14 LAB — MAGNESIUM: Magnesium: 1.9 mg/dL

## 2014-01-14 LAB — COMPREHENSIVE METABOLIC PANEL
Albumin: 1.6 g/dL — ABNORMAL LOW (ref 3.4–5.0)
Alkaline Phosphatase: 137 U/L — ABNORMAL HIGH
Anion Gap: 7 (ref 7–16)
BUN: 14 mg/dL (ref 7–18)
Bilirubin,Total: 0.8 mg/dL (ref 0.2–1.0)
CALCIUM: 8.3 mg/dL — AB (ref 8.5–10.1)
Chloride: 98 mmol/L (ref 98–107)
Co2: 37 mmol/L — ABNORMAL HIGH (ref 21–32)
Creatinine: 0.45 mg/dL — ABNORMAL LOW (ref 0.60–1.30)
EGFR (African American): >60
EGFR (Non-African Amer.): >60
Glucose: 83 mg/dL (ref 65–99)
Osmolality: 283 (ref 275–301)
POTASSIUM: 2.4 mmol/L — AB (ref 3.5–5.1)
SGOT(AST): 62 U/L — ABNORMAL HIGH (ref 15–37)
SGPT (ALT): 61 U/L (ref 12–78)
Sodium: 142 mmol/L (ref 136–145)
Total Protein: 5.1 g/dL — ABNORMAL LOW (ref 6.4–8.2)

## 2014-01-14 LAB — POTASSIUM: Potassium: 2.3 mmol/L — CL (ref 3.5–5.1)

## 2014-01-15 ENCOUNTER — Institutional Professional Consult (permissible substitution)
Admission: AD | Admit: 2014-01-15 | Discharge: 2014-01-31 | Disposition: A | Discharge status: Skilled Nursing Facility | Payer: Commercial Indemnity | Source: Ambulatory Visit | Attending: Internal Medicine | Admitting: Internal Medicine

## 2014-01-15 ENCOUNTER — Ambulatory Visit (HOSPITAL_COMMUNITY)
Admission: AD | Admit: 2014-01-15 | Discharge: 2014-01-15 | Disposition: A | Discharge status: Home / Self Care | Payer: Commercial Indemnity | Source: Other Acute Inpatient Hospital | Attending: Internal Medicine | Admitting: Internal Medicine

## 2014-01-15 DIAGNOSIS — Z9911 Dependence on respirator [ventilator] status: Secondary | ICD-10-CM | POA: Insufficient documentation

## 2014-01-15 LAB — CBC WITH DIFFERENTIAL/PLATELET
BASOS PCT: 0.4 %
Basophil #: 0.1 10*3/uL (ref 0.0–0.1)
Eosinophil #: 0.4 10*3/uL (ref 0.0–0.7)
Eosinophil %: 2.1 %
HCT: 24.9 % — AB (ref 35.0–47.0)
HGB: 8.1 g/dL — AB (ref 12.0–16.0)
LYMPHS ABS: 0.7 10*3/uL — AB (ref 1.0–3.6)
LYMPHS PCT: 3.7 %
MCH: 30.1 pg (ref 26.0–34.0)
MCHC: 32.4 g/dL (ref 32.0–36.0)
MCV: 93 fL (ref 80–100)
Monocyte #: 0.6 x10 3/mm (ref 0.2–0.9)
Monocyte %: 3.7 %
Neutrophil #: 15.8 10*3/uL — ABNORMAL HIGH (ref 1.4–6.5)
Neutrophil %: 90.1 %
PLATELETS: 318 10*3/uL (ref 150–440)
RBC: 2.68 10*6/uL — AB (ref 3.80–5.20)
RDW: 16.5 % — AB (ref 11.5–14.5)
WBC: 17.6 10*3/uL — AB (ref 3.6–11.0)

## 2014-01-15 LAB — BASIC METABOLIC PANEL
ANION GAP: 4 — AB (ref 7–16)
BUN: 10 mg/dL (ref 7–18)
CALCIUM: 8.1 mg/dL — AB (ref 8.5–10.1)
CHLORIDE: 104 mmol/L (ref 98–107)
Co2: 35 mmol/L — ABNORMAL HIGH (ref 21–32)
Creatinine: 0.48 mg/dL — ABNORMAL LOW (ref 0.60–1.30)
EGFR (African American): >60
EGFR (Non-African Amer.): >60
Glucose: 91 mg/dL (ref 65–99)
OSMOLALITY: 284 (ref 275–301)
Potassium: 2.6 mmol/L — ABNORMAL LOW (ref 3.5–5.1)
Sodium: 143 mmol/L (ref 136–145)

## 2014-01-15 LAB — HEPATIC FUNCTION PANEL A (ARMC)
ALT: 55 U/L (ref 12–78)
Albumin: 1.7 g/dL — ABNORMAL LOW (ref 3.4–5.0)
Alkaline Phosphatase: 123 U/L — ABNORMAL HIGH
BILIRUBIN TOTAL: 0.6 mg/dL (ref 0.2–1.0)
Bilirubin, Direct: 0.2 mg/dL (ref 0.00–0.20)
SGOT(AST): 35 U/L (ref 15–37)
Total Protein: 5.1 g/dL — ABNORMAL LOW (ref 6.4–8.2)

## 2014-01-15 LAB — BLOOD GAS, ARTERIAL
Acid-Base Excess: 12.1 mmol/L — ABNORMAL HIGH (ref 0.0–2.0)
BICARBONATE: 36 meq/L — AB (ref 20.0–24.0)
Delivery systems: POSITIVE
EXPIRATORY PAP: 6
FIO2: 0.5 %
Inspiratory PAP: 12
Mode: POSITIVE
O2 SAT: 98.8 %
PH ART: 7.518 — AB (ref 7.350–7.450)
PO2 ART: 111 mmHg — AB (ref 80.0–100.0)
Patient temperature: 98.6
TCO2: 37.4 mmol/L (ref 0–100)
pCO2 arterial: 44.6 mmHg (ref 35.0–45.0)

## 2014-01-15 LAB — CULTURE, BLOOD (SINGLE)

## 2014-01-15 LAB — PHOSPHORUS: PHOSPHORUS: 2.1 mg/dL — AB (ref 2.5–4.9)

## 2014-01-15 LAB — MAGNESIUM: Magnesium: 2.2 mg/dL

## 2014-01-15 LAB — POTASSIUM: Potassium: 2.6 mmol/L — ABNORMAL LOW (ref 3.5–5.1)

## 2014-01-15 LAB — LIPASE, BLOOD: LIPASE: 206 U/L (ref 73–393)

## 2014-01-16 ENCOUNTER — Other Ambulatory Visit (HOSPITAL_COMMUNITY): Payer: Commercial Indemnity

## 2014-01-16 LAB — BASIC METABOLIC PANEL
BUN: 18 mg/dL (ref 6–23)
CHLORIDE: 95 meq/L — AB (ref 96–112)
CO2: 33 mEq/L — ABNORMAL HIGH (ref 19–32)
Calcium: 8.6 mg/dL (ref 8.4–10.5)
Creatinine, Ser: 0.41 mg/dL — ABNORMAL LOW (ref 0.50–1.10)
GFR calc non Af Amer: >90 mL/min (ref >90)
GLUCOSE: 105 mg/dL — AB (ref 70–99)
POTASSIUM: 2.8 meq/L — AB (ref 3.7–5.3)
Sodium: 141 mEq/L (ref 137–147)

## 2014-01-16 LAB — BLOOD GAS, ARTERIAL
Acid-Base Excess: 11.2 mmol/L — ABNORMAL HIGH (ref 0.0–2.0)
BICARBONATE: 34.7 meq/L — AB (ref 20.0–24.0)
FIO2: 0.4 %
O2 Content: 30 L/min
O2 Saturation: 92.9 %
PATIENT TEMPERATURE: 98.6
PH ART: 7.539 — AB (ref 7.350–7.450)
PO2 ART: 62.7 mmHg — AB (ref 80.0–100.0)
TCO2: 36 mmol/L (ref 0–100)
pCO2 arterial: 40.8 mmHg (ref 35.0–45.0)

## 2014-01-16 LAB — CBC WITH DIFFERENTIAL/PLATELET
Basophils Absolute: 0 10*3/uL (ref 0.0–0.1)
Basophils Relative: 0 % (ref 0–1)
Eosinophils Absolute: 0 10*3/uL (ref 0.0–0.7)
Eosinophils Relative: 0 % (ref 0–5)
HCT: 26.1 % — ABNORMAL LOW (ref 36.0–46.0)
HEMOGLOBIN: 8.5 g/dL — AB (ref 12.0–15.0)
LYMPHS ABS: 0.7 10*3/uL (ref 0.7–4.0)
Lymphocytes Relative: 5 % — ABNORMAL LOW (ref 12–46)
MCH: 30.7 pg (ref 26.0–34.0)
MCHC: 32.6 g/dL (ref 30.0–36.0)
MCV: 94.2 fL (ref 78.0–100.0)
Monocytes Absolute: 0.5 10*3/uL (ref 0.1–1.0)
Monocytes Relative: 4 % (ref 3–12)
NEUTROS ABS: 12.9 10*3/uL — AB (ref 1.7–7.7)
NEUTROS PCT: 91 % — AB (ref 43–77)
Platelets: 322 10*3/uL (ref 150–400)
RBC: 2.77 MIL/uL — AB (ref 3.87–5.11)
RDW: 17 % — ABNORMAL HIGH (ref 11.5–15.5)
WBC: 14 10*3/uL — ABNORMAL HIGH (ref 4.0–10.5)

## 2014-01-17 LAB — POTASSIUM: Potassium: 3.3 mEq/L — ABNORMAL LOW (ref 3.7–5.3)

## 2014-01-18 LAB — VANCOMYCIN, TROUGH: Vancomycin Tr: <5 ug/mL — ABNORMAL LOW (ref 10.0–20.0)

## 2014-01-19 LAB — CULTURE, BLOOD (SINGLE)

## 2014-01-20 LAB — CBC WITH DIFFERENTIAL/PLATELET
Basophils Absolute: 0 10*3/uL (ref 0.0–0.1)
Basophils Relative: 0 % (ref 0–1)
Eosinophils Absolute: 0.8 10*3/uL — ABNORMAL HIGH (ref 0.0–0.7)
Eosinophils Relative: 5 % (ref 0–5)
HCT: 27.9 % — ABNORMAL LOW (ref 36.0–46.0)
Hemoglobin: 9.1 g/dL — ABNORMAL LOW (ref 12.0–15.0)
Lymphocytes Relative: 11 % — ABNORMAL LOW (ref 12–46)
Lymphs Abs: 1.7 10*3/uL (ref 0.7–4.0)
MCH: 30.3 pg (ref 26.0–34.0)
MCHC: 32.6 g/dL (ref 30.0–36.0)
MCV: 93 fL (ref 78.0–100.0)
MONO ABS: 0.5 10*3/uL (ref 0.1–1.0)
MONOS PCT: 3 % (ref 3–12)
Neutro Abs: 12.1 10*3/uL — ABNORMAL HIGH (ref 1.7–7.7)
Neutrophils Relative %: 81 % — ABNORMAL HIGH (ref 43–77)
PLATELETS: 216 10*3/uL (ref 150–400)
RBC: 3 MIL/uL — ABNORMAL LOW (ref 3.87–5.11)
RDW: 16.4 % — AB (ref 11.5–15.5)
WBC: 15.1 10*3/uL — AB (ref 4.0–10.5)

## 2014-01-20 LAB — BASIC METABOLIC PANEL
BUN: 14 mg/dL (ref 6–23)
CALCIUM: 8.5 mg/dL (ref 8.4–10.5)
CO2: 29 mEq/L (ref 19–32)
CREATININE: 0.42 mg/dL — AB (ref 0.50–1.10)
Chloride: 98 mEq/L (ref 96–112)
GLUCOSE: 80 mg/dL (ref 70–99)
POTASSIUM: 2.4 meq/L — AB (ref 3.7–5.3)
Sodium: 141 mEq/L (ref 137–147)

## 2014-01-20 LAB — MAGNESIUM: Magnesium: 1.6 mg/dL (ref 1.5–2.5)

## 2014-01-21 LAB — COMPREHENSIVE METABOLIC PANEL
ALT: 53 U/L — ABNORMAL HIGH (ref 0–35)
AST: 21 U/L (ref 0–37)
Albumin: 2.4 g/dL — ABNORMAL LOW (ref 3.5–5.2)
Alkaline Phosphatase: 109 U/L (ref 39–117)
BUN: 20 mg/dL (ref 6–23)
CALCIUM: 9.1 mg/dL (ref 8.4–10.5)
CO2: 27 meq/L (ref 19–32)
Chloride: 102 mEq/L (ref 96–112)
Creatinine, Ser: 0.44 mg/dL — ABNORMAL LOW (ref 0.50–1.10)
GLUCOSE: 108 mg/dL — AB (ref 70–99)
Potassium: 3.3 mEq/L — ABNORMAL LOW (ref 3.7–5.3)
Sodium: 141 mEq/L (ref 137–147)
TOTAL PROTEIN: 6.1 g/dL (ref 6.0–8.3)
Total Bilirubin: 0.4 mg/dL (ref 0.3–1.2)

## 2014-01-21 LAB — MAGNESIUM: Magnesium: 1.9 mg/dL (ref 1.5–2.5)

## 2014-01-23 DIAGNOSIS — I509 Heart failure, unspecified: Secondary | ICD-10-CM

## 2014-01-23 LAB — BASIC METABOLIC PANEL
BUN: 18 mg/dL (ref 6–23)
CO2: 27 meq/L (ref 19–32)
Calcium: 8.8 mg/dL (ref 8.4–10.5)
Chloride: 103 mEq/L (ref 96–112)
Creatinine, Ser: 0.38 mg/dL — ABNORMAL LOW (ref 0.50–1.10)
GFR calc Af Amer: >90 mL/min (ref >90)
GFR calc non Af Amer: >90 mL/min (ref >90)
Glucose, Bld: 101 mg/dL — ABNORMAL HIGH (ref 70–99)
POTASSIUM: 3.1 meq/L — AB (ref 3.7–5.3)
SODIUM: 142 meq/L (ref 137–147)

## 2014-01-23 LAB — CBC WITH DIFFERENTIAL/PLATELET
Basophils Absolute: 0 10*3/uL (ref 0.0–0.1)
Basophils Relative: 0 % (ref 0–1)
EOS PCT: 2 % (ref 0–5)
Eosinophils Absolute: 0.4 10*3/uL (ref 0.0–0.7)
HCT: 27.7 % — ABNORMAL LOW (ref 36.0–46.0)
Hemoglobin: 9 g/dL — ABNORMAL LOW (ref 12.0–15.0)
LYMPHS PCT: 11 % — AB (ref 12–46)
Lymphs Abs: 1.7 10*3/uL (ref 0.7–4.0)
MCH: 31.1 pg (ref 26.0–34.0)
MCHC: 32.5 g/dL (ref 30.0–36.0)
MCV: 95.8 fL (ref 78.0–100.0)
Monocytes Absolute: 0.5 10*3/uL (ref 0.1–1.0)
Monocytes Relative: 3 % (ref 3–12)
NEUTROS ABS: 13.1 10*3/uL — AB (ref 1.7–7.7)
NEUTROS PCT: 84 % — AB (ref 43–77)
PLATELETS: 283 10*3/uL (ref 150–400)
RBC: 2.89 MIL/uL — AB (ref 3.87–5.11)
RDW: 17.4 % — ABNORMAL HIGH (ref 11.5–15.5)
WBC: 15.7 10*3/uL — AB (ref 4.0–10.5)

## 2014-01-23 LAB — PRO B NATRIURETIC PEPTIDE: Pro B Natriuretic peptide (BNP): 317.5 pg/mL — ABNORMAL HIGH (ref 0–125)

## 2014-01-23 LAB — PHOSPHORUS: PHOSPHORUS: 3 mg/dL (ref 2.3–4.6)

## 2014-01-23 LAB — MAGNESIUM: Magnesium: 1.5 mg/dL (ref 1.5–2.5)

## 2014-01-23 LAB — PREALBUMIN: PREALBUMIN: 23.4 mg/dL (ref 17.0–34.0)

## 2014-01-23 NOTE — Progress Notes (Signed)
**Note De-identified [place] [facility]**  **Note De-Identified [place] [facility]** Echocardiogram 2D Echocardiogram has been performed.  Haley [name] 01/23/2014, 12:14 PM

## 2014-01-24 ENCOUNTER — Other Ambulatory Visit (HOSPITAL_COMMUNITY): Payer: Commercial Indemnity

## 2014-01-24 ENCOUNTER — Encounter: Payer: Self-pay | Admitting: Radiology

## 2014-01-24 LAB — BASIC METABOLIC PANEL
BUN: 12 mg/dL (ref 6–23)
CALCIUM: 9.1 mg/dL (ref 8.4–10.5)
CO2: 26 mEq/L (ref 19–32)
Chloride: 101 mEq/L (ref 96–112)
Creatinine, Ser: 0.39 mg/dL — ABNORMAL LOW (ref 0.50–1.10)
GFR calc Af Amer: >90 mL/min (ref >90)
Glucose, Bld: 85 mg/dL (ref 70–99)
POTASSIUM: 3.6 meq/L — AB (ref 3.7–5.3)
SODIUM: 141 meq/L (ref 137–147)

## 2014-01-25 ENCOUNTER — Other Ambulatory Visit (HOSPITAL_COMMUNITY): Payer: Commercial Indemnity

## 2014-01-25 LAB — BLOOD GAS, ARTERIAL
ACID-BASE EXCESS: 3.4 mmol/L — AB (ref 0.0–2.0)
ACID-BASE EXCESS: 4.6 mmol/L — AB (ref 0.0–2.0)
Bicarbonate: 28.4 mEq/L — ABNORMAL HIGH (ref 20.0–24.0)
Bicarbonate: 27.2 mEq/L — ABNORMAL HIGH (ref 20.0–24.0)
O2 CONTENT: 2 L/min
O2 Content: 2 L/min
O2 SAT: 100 %
O2 Saturation: 98.3 %
PATIENT TEMPERATURE: 98.6
PATIENT TEMPERATURE: 98.6
PO2 ART: 101 mmHg — AB (ref 80.0–100.0)
PO2 ART: 99 mmHg (ref 80.0–100.0)
TCO2: 28.5 mmol/L (ref 0–100)
TCO2: 29.6 mmol/L (ref 0–100)
pCO2 arterial: 40.1 mmHg (ref 35.0–45.0)
pCO2 arterial: 40.3 mmHg (ref 35.0–45.0)
pH, Arterial: 7.463 — ABNORMAL HIGH (ref 7.350–7.450)
pH, Arterial: 7.445 (ref 7.350–7.450)

## 2014-01-25 LAB — PHOSPHORUS: PHOSPHORUS: 3.7 mg/dL (ref 2.3–4.6)

## 2014-01-25 LAB — BASIC METABOLIC PANEL
BUN: 20 mg/dL (ref 6–23)
CHLORIDE: 103 meq/L (ref 96–112)
CO2: 28 mEq/L (ref 19–32)
Calcium: 10 mg/dL (ref 8.4–10.5)
Creatinine, Ser: 0.39 mg/dL — ABNORMAL LOW (ref 0.50–1.10)
Glucose, Bld: 98 mg/dL (ref 70–99)
Potassium: 4 mEq/L (ref 3.7–5.3)
SODIUM: 142 meq/L (ref 137–147)

## 2014-01-25 LAB — CBC WITH DIFFERENTIAL/PLATELET
Basophils Absolute: 0 10*3/uL (ref 0.0–0.1)
Basophils Relative: 0 % (ref 0–1)
Eosinophils Absolute: 0.2 10*3/uL (ref 0.0–0.7)
Eosinophils Relative: 1 % (ref 0–5)
HCT: 30.4 % — ABNORMAL LOW (ref 36.0–46.0)
HEMOGLOBIN: 9.7 g/dL — AB (ref 12.0–15.0)
LYMPHS ABS: 2.3 10*3/uL (ref 0.7–4.0)
Lymphocytes Relative: 13 % (ref 12–46)
MCH: 31.1 pg (ref 26.0–34.0)
MCHC: 31.9 g/dL (ref 30.0–36.0)
MCV: 97.4 fL (ref 78.0–100.0)
MONOS PCT: 6 % (ref 3–12)
Monocytes Absolute: 1 10*3/uL (ref 0.1–1.0)
NEUTROS ABS: 13.9 10*3/uL — AB (ref 1.7–7.7)
NEUTROS PCT: 80 % — AB (ref 43–77)
PLATELETS: 421 10*3/uL — AB (ref 150–400)
RBC: 3.12 MIL/uL — AB (ref 3.87–5.11)
RDW: 18.1 % — ABNORMAL HIGH (ref 11.5–15.5)
WBC: 17.4 10*3/uL — ABNORMAL HIGH (ref 4.0–10.5)

## 2014-01-25 LAB — MAGNESIUM: Magnesium: 1.7 mg/dL (ref 1.5–2.5)

## 2014-01-26 ENCOUNTER — Other Ambulatory Visit (HOSPITAL_COMMUNITY): Payer: Commercial Indemnity

## 2014-01-26 LAB — CBC WITH DIFFERENTIAL/PLATELET
BASOS PCT: 0 % (ref 0–1)
Basophils Absolute: 0 10*3/uL (ref 0.0–0.1)
EOS ABS: 0.2 10*3/uL (ref 0.0–0.7)
EOS PCT: 1 % (ref 0–5)
HEMATOCRIT: 33 % — AB (ref 36.0–46.0)
Hemoglobin: 10.6 g/dL — ABNORMAL LOW (ref 12.0–15.0)
Lymphocytes Relative: 19 % (ref 12–46)
Lymphs Abs: 3.1 10*3/uL (ref 0.7–4.0)
MCH: 32 pg (ref 26.0–34.0)
MCHC: 32.1 g/dL (ref 30.0–36.0)
MCV: 99.7 fL (ref 78.0–100.0)
MONO ABS: 1.2 10*3/uL — AB (ref 0.1–1.0)
Monocytes Relative: 7 % (ref 3–12)
NEUTROS ABS: 12 10*3/uL — AB (ref 1.7–7.7)
NEUTROS PCT: 73 % (ref 43–77)
Platelets: 486 10*3/uL — ABNORMAL HIGH (ref 150–400)
RBC: 3.31 MIL/uL — ABNORMAL LOW (ref 3.87–5.11)
RDW: 18.1 % — ABNORMAL HIGH (ref 11.5–15.5)
WBC: 16.5 10*3/uL — ABNORMAL HIGH (ref 4.0–10.5)

## 2014-01-26 LAB — URINALYSIS, ROUTINE W REFLEX MICROSCOPIC
BILIRUBIN URINE: NEGATIVE
GLUCOSE, UA: NEGATIVE mg/dL
Hgb urine dipstick: NEGATIVE
Ketones, ur: NEGATIVE mg/dL
Leukocytes, UA: NEGATIVE
Nitrite: NEGATIVE
Protein, ur: NEGATIVE mg/dL
SPECIFIC GRAVITY, URINE: 1.02 (ref 1.005–1.030)
Urobilinogen, UA: 0.2 mg/dL (ref 0.0–1.0)
pH: 7.5 (ref 5.0–8.0)

## 2014-01-26 LAB — BASIC METABOLIC PANEL
BUN: 19 mg/dL (ref 6–23)
CO2: 24 meq/L (ref 19–32)
CREATININE: 0.43 mg/dL — AB (ref 0.50–1.10)
Calcium: 10.1 mg/dL (ref 8.4–10.5)
Chloride: 100 mEq/L (ref 96–112)
GFR calc Af Amer: >90 mL/min (ref >90)
GFR calc non Af Amer: >90 mL/min (ref >90)
GLUCOSE: 88 mg/dL (ref 70–99)
Potassium: 4.4 mEq/L (ref 3.7–5.3)
Sodium: 139 mEq/L (ref 137–147)

## 2014-01-26 LAB — CLOSTRIDIUM DIFFICILE BY PCR: Toxigenic C. Difficile by PCR: NEGATIVE

## 2014-01-27 LAB — CBC WITH DIFFERENTIAL/PLATELET
BASOS PCT: 0 % (ref 0–1)
Basophils Absolute: 0 10*3/uL (ref 0.0–0.1)
Eosinophils Absolute: 0.2 10*3/uL (ref 0.0–0.7)
Eosinophils Relative: 1 % (ref 0–5)
HEMATOCRIT: 33.5 % — AB (ref 36.0–46.0)
Hemoglobin: 10.4 g/dL — ABNORMAL LOW (ref 12.0–15.0)
LYMPHS PCT: 21 % (ref 12–46)
Lymphs Abs: 3.3 10*3/uL (ref 0.7–4.0)
MCH: 30.5 pg (ref 26.0–34.0)
MCHC: 31 g/dL (ref 30.0–36.0)
MCV: 98.2 fL (ref 78.0–100.0)
MONOS PCT: 8 % (ref 3–12)
Monocytes Absolute: 1.2 10*3/uL — ABNORMAL HIGH (ref 0.1–1.0)
NEUTROS ABS: 10.9 10*3/uL — AB (ref 1.7–7.7)
Neutrophils Relative %: 70 % (ref 43–77)
Platelets: 530 10*3/uL — ABNORMAL HIGH (ref 150–400)
RBC: 3.41 MIL/uL — ABNORMAL LOW (ref 3.87–5.11)
RDW: 17.9 % — ABNORMAL HIGH (ref 11.5–15.5)
WBC: 15.6 10*3/uL — AB (ref 4.0–10.5)

## 2014-01-29 LAB — COMPREHENSIVE METABOLIC PANEL
ALBUMIN: 3 g/dL — AB (ref 3.5–5.2)
ALT: 39 U/L — ABNORMAL HIGH (ref 0–35)
AST: 20 U/L (ref 0–37)
Alkaline Phosphatase: 94 U/L (ref 39–117)
BUN: 27 mg/dL — ABNORMAL HIGH (ref 6–23)
CALCIUM: 9.7 mg/dL (ref 8.4–10.5)
CO2: 24 mEq/L (ref 19–32)
CREATININE: 0.46 mg/dL — AB (ref 0.50–1.10)
Chloride: 99 mEq/L (ref 96–112)
GFR calc Af Amer: >90 mL/min (ref >90)
GFR calc non Af Amer: >90 mL/min (ref >90)
Glucose, Bld: 96 mg/dL (ref 70–99)
Potassium: 4.7 mEq/L (ref 3.7–5.3)
Sodium: 138 mEq/L (ref 137–147)
Total Bilirubin: 0.6 mg/dL (ref 0.3–1.2)
Total Protein: 6.7 g/dL (ref 6.0–8.3)

## 2014-01-29 LAB — CBC WITH DIFFERENTIAL/PLATELET
Basophils Absolute: 0.2 10*3/uL — ABNORMAL HIGH (ref 0.0–0.1)
Basophils Relative: 1 % (ref 0–1)
EOS ABS: 0.2 10*3/uL (ref 0.0–0.7)
Eosinophils Relative: 1 % (ref 0–5)
HCT: 36.1 % (ref 36.0–46.0)
Hemoglobin: 11.3 g/dL — ABNORMAL LOW (ref 12.0–15.0)
LYMPHS ABS: 3.1 10*3/uL (ref 0.7–4.0)
Lymphocytes Relative: 20 % (ref 12–46)
MCH: 31.4 pg (ref 26.0–34.0)
MCHC: 31.3 g/dL (ref 30.0–36.0)
MCV: 100.3 fL — AB (ref 78.0–100.0)
MONO ABS: 1.3 10*3/uL — AB (ref 0.1–1.0)
Monocytes Relative: 8 % (ref 3–12)
NEUTROS PCT: 70 % (ref 43–77)
Neutro Abs: 10.9 10*3/uL — ABNORMAL HIGH (ref 1.7–7.7)
PLATELETS: 541 10*3/uL — AB (ref 150–400)
RBC: 3.6 MIL/uL — ABNORMAL LOW (ref 3.87–5.11)
RDW: 17.1 % — AB (ref 11.5–15.5)
WBC: 15.7 10*3/uL — ABNORMAL HIGH (ref 4.0–10.5)

## 2014-01-29 LAB — URINE CULTURE

## 2014-01-29 LAB — PREALBUMIN: Prealbumin: 35.1 mg/dL — ABNORMAL HIGH (ref 17.0–34.0)

## 2014-01-29 LAB — PROCALCITONIN

## 2014-01-31 ENCOUNTER — Ambulatory Visit: Payer: Self-pay | Admitting: Internal Medicine

## 2014-03-15 ENCOUNTER — Ambulatory Visit: Payer: Self-pay | Admitting: Family Medicine

## 2014-04-02 ENCOUNTER — Ambulatory Visit: Payer: Self-pay | Admitting: Family Medicine

## 2014-04-16 DIAGNOSIS — J449 Chronic obstructive pulmonary disease, unspecified: Secondary | ICD-10-CM | POA: Insufficient documentation

## 2014-05-17 ENCOUNTER — Emergency Department: Payer: Self-pay | Admitting: Emergency Medicine

## 2014-05-17 LAB — LIPASE, BLOOD: LIPASE: 309 U/L (ref 73–393)

## 2014-05-17 LAB — COMPREHENSIVE METABOLIC PANEL
ALBUMIN: 3.7 g/dL (ref 3.4–5.0)
ALK PHOS: 103 U/L
Anion Gap: 7 (ref 7–16)
BILIRUBIN TOTAL: 0.5 mg/dL (ref 0.2–1.0)
BUN: 6 mg/dL — AB (ref 7–18)
CALCIUM: 9.4 mg/dL (ref 8.5–10.1)
CHLORIDE: 101 mmol/L (ref 98–107)
Co2: 33 mmol/L — ABNORMAL HIGH (ref 21–32)
Creatinine: 0.79 mg/dL (ref 0.60–1.30)
GLUCOSE: 91 mg/dL (ref 65–99)
Osmolality: 278 (ref 275–301)
POTASSIUM: 2.7 mmol/L — AB (ref 3.5–5.1)
SGOT(AST): 19 U/L (ref 15–37)
SGPT (ALT): 28 U/L
SODIUM: 141 mmol/L (ref 136–145)
Total Protein: 8 g/dL (ref 6.4–8.2)

## 2014-05-17 LAB — CBC WITH DIFFERENTIAL/PLATELET
BASOS ABS: 0.1 10*3/uL (ref 0.0–0.1)
Basophil %: 1.1 %
EOS ABS: 0.1 10*3/uL (ref 0.0–0.7)
EOS PCT: 0.5 %
HCT: 49.4 % — ABNORMAL HIGH (ref 35.0–47.0)
HGB: 16.3 g/dL — ABNORMAL HIGH (ref 12.0–16.0)
Lymphocyte #: 2.6 10*3/uL (ref 1.0–3.6)
Lymphocyte %: 21.2 %
MCH: 30.7 pg (ref 26.0–34.0)
MCHC: 33.1 g/dL (ref 32.0–36.0)
MCV: 93 fL (ref 80–100)
MONOS PCT: 5.8 %
Monocyte #: 0.7 x10 3/mm (ref 0.2–0.9)
NEUTROS PCT: 71.4 %
Neutrophil #: 8.8 10*3/uL — ABNORMAL HIGH (ref 1.4–6.5)
PLATELETS: 504 10*3/uL — AB (ref 150–440)
RBC: 5.32 10*6/uL — ABNORMAL HIGH (ref 3.80–5.20)
RDW: 13.6 % (ref 11.5–14.5)
WBC: 12.4 10*3/uL — ABNORMAL HIGH (ref 3.6–11.0)

## 2014-05-18 LAB — URINALYSIS, COMPLETE
Bacteria: NONE SEEN
Bilirubin,UR: NEGATIVE
Glucose,UR: NEGATIVE mg/dL (ref 0–75)
Ketone: NEGATIVE
Leukocyte Esterase: NEGATIVE
Nitrite: NEGATIVE
Ph: 7 (ref 4.5–8.0)
Protein: NEGATIVE
RBC,UR: 1 /HPF (ref 0–5)
Specific Gravity: 1.004 (ref 1.003–1.030)
Squamous Epithelial: NONE SEEN
WBC UR: 1 /HPF (ref 0–5)

## 2014-06-03 DIAGNOSIS — M8580 Other specified disorders of bone density and structure, unspecified site: Secondary | ICD-10-CM

## 2014-06-03 HISTORY — DX: Other specified disorders of bone density and structure, unspecified site: M85.80

## 2014-06-04 ENCOUNTER — Ambulatory Visit: Payer: Self-pay | Admitting: Urgent Care

## 2014-11-24 NOTE — Consult Note (Signed)
**Note De-Identified [place] [facility]** Brief Consult Note: Diagnosis: pancreatitis, pancreatic lesion.   Patient was seen by consultant.   Comments: [name] is a critically-ill 54 y/o caucasian female admitted with hypoxic respiratory failure secondary to necrotizing pneumonia currently on mechanical ventilation, septic shock, acute renal failure, anemia, & thrombocytopenia.  She is found to have inflammatory changes about the pancreas & a 1.7 x1.6 cm ill defined hypodensity in uncinate process of pancreas as well as small volume ascites, trace pleural effusions, anasarca, & necrotizing PNA on CT. There is no evidence of cholelithiasis & common bile duct is normal.  Her lipase & LFTS are normal.  Findings on CT could represent fatty infiltration of pancreas or focal pancreatitis or small mass.  She should have repeat imaging in 4-6 weeks to allow inflammation to resolve with MRCP/MR ABD.  I have discussed her care with Dr Evangeline Gula [facility] & our plan of care is below.  Plan: 1) contiue supportive measures 2) continue PPI BID 3) FU MRCP/MR ABD with/without contrast in 4-6 weeks if pt able to tolerate Thanks for allowing [place] to participate in her care.  Please see full dictated note. #[id number].  Electronic Signatures: Andria Meuse (NP)  (Signed 08-Jun-15 12:41)  Authored: Brief Consult Note   Last Updated: 08-Jun-15 12:41 by Andria Meuse (NP)

## 2014-11-24 NOTE — Consult Note (Signed)
**Note De-Identified [place] [facility]** ONCOLOGY followup note - patient in CCU, now on mechanical ventilation due to progressive respiratory failure. Per d/w nurse, no obvious bleeding issues. no fevers. No diarrhea.on mech ventilation, sedated.        vitals - 97.5, 93, 35, 92/65, 99% on vent        lungs - decreased BS b/l, more so in RLL area       abd - soft, BS +- WBC 24.5, platelets 54, Cr 1.31, calcium normal. Recent serum iron 12, iron saturation 7%, LDH 258, PT and PTT unremarkable. Blood c/s gram positive diplococci. [place] abd negative for hepatosplenomegaly  Impression/Recommendations: 54 year old female patient with no known history of low blood counts in the past, no history of alcohol abuse, chronic liver disease or splenomegaly who has been admitted with one week's duration of respiratory symptoms and found to have multilobar pneumonia. The patient is currently on broad-spectrum antibiotic coverage with IV Rocephin and vancomycin. Blood culture is growing diplococci. She had significant leukopenia at admission but now has developed neutrophilic leucocytosis, WBC higher today at 24.5, this could be from ongoing acute illness and steroid effect. Platelet count is is slightly better but still low at 54 but no bleeding issues, DIC panel unremarkable. Etiology for her low blood counts is still unclear, work-up so far shows iron deficiency and mildly elevated RA factor. Once stool hemoccult x 2 is obtained, then start on oral iron. Bone marrow biopsy was planned but will hold off until more stable. Monitor CBC daily while in hospital. Will continue to follow.  Electronic Signatures: Jonn Shingles (MD)  (Signed on 03-Jun-15 06:16)  Authored  Last Updated: 03-Jun-15 06:16 by Jonn Shingles (MD)

## 2014-11-24 NOTE — Consult Note (Signed)
**Note De-Identified [place] [facility]** ONCOLOGY followup note - still weak but overall cough and SOB better. No fevers.denies bleeding symptoms. Feels nausea. sitting in bed. NAD. No icterus.       vitals - afebrile, stable        lungs - decreased BS RLL area       abd - soft, NT- WBC 3.0, Hb 13.5, platelets 81, 87% neutrophils, Cr 1.6, serum iron 12, iron saturation 7%, LDH 258.  Impression/Recommendations: 54 year old female patient with no known history of low blood counts in the past, no history of alcohol abuse, chronic liver disease or splenomegaly who has been admitted with one week's duration of respiratory symptoms and found to have multilobar pneumonia. The patient is currently on broad-spectrum antibiotic coverage with IV Rocephin and vancomycin. Blood culture is growing diplococci so far, sensitivities pending. She has significant leukopenia but has adequate absolute neutrophil count since she has 87% neutrophils, platelet count is lower at 81 but no bleeding issues. Hemoglobin normal. Etiology for her low blood counts is unclear, work-up so far sows iron defieicny. Will get stool hemoccult x 2 and then start on oral iron. Remaining workup is pending, will followup and plan further investigation, may need to pursue bone marrow biopsy. Monitor CBC daily while in hospital. The patient is agreeable to this plan.   Electronic Signatures: Jonn Shingles (MD)  (Signed on 31-May-15 23:09)  Authored  Last Updated: 31-May-15 23:09 by Jonn Shingles (MD)

## 2014-11-24 NOTE — H&P (Signed)
**Note De-Identified [place] [facility]** PATIENT NAME:  Haley [name], Haley [name] MR#:  [id number] DATE OF BIRTH:  11/14/1960  DATE OF ADMISSION:  12/30/2013  PRIMARY CARE PHYSICIAN:  Nonlocal.  REFERRING PHYSICIAN:  Dr. Benjaman Lobe.   CHIEF COMPLAINT:  Cough, sputum, shortness of breath for one week.   HISTORY OF PRESENT ILLNESS:  A 54 year old Caucasian female with a history of hypertension, asthma, depression, came to the ED due to one week period of shortness of breath, cough with sputum.  The patient also has a headache and weakness, poor oral intake.  The patient was found hypoxic in the ED, was given oxygen by nasal cannula 4 liters and antibiotics, Levaquin.   PAST MEDICAL HISTORY:  Hypertension, asthma, depression, esophagitis.   PAST SURGICAL HISTORY:  C-section.   FAMILY HISTORY:  No hypertension, diabetes, cancer, heart attack or stroke.  Denies any other family history.   SOCIAL HISTORY:  Smoking 1 pack a day for 20 years, no alcohol drinking or illicit drugs.   ALLERGIES:  SULFA DRUGS.   HOME MEDICATIONS: 1.  Zyprexa 5 mg p.o. at bedtime.  2.  Omeprazole 20 mg p.o. once a day.  3.  Citalopram 20 mg p.o. b.i.d.  4.  Buspirone 15 mg p.o. t.i.d.   REVIEW OF SYSTEMS:  CONSTITUTIONAL:  The patient denies any fever or chills, but has a headache, no dizziness, has generalized weakness and poor oral intake.  EYES:  No double vision, blurred vision.  EARS, NOSE, THROAT:  No postnasal drip, slurred speech or dysphagia.  CARDIOVASCULAR:  Has chest pain all over the chest including from the back.  No palpitations, orthopnea, or nocturnal dyspnea.  No leg edema.  PULMONARY:  Positive for cough, sputum, shortness of breath, but no hemoptysis.  No wheezing.  GASTROINTESTINAL:  No abdominal pain, nausea, vomiting, diarrhea.  No melena or bloody stool.  GENITOURINARY:  No dysuria, hematuria, or incontinence.  SKIN:  No rash or jaundice.  NEUROLOGY:  No syncope, loss of consciousness or seizure.  HEMATOLOGIC:  No easy bruising or bleeding.   ENDOCRINE:  No polyuria, polydipsia, heat or cold intolerance.   PHYSICAL EXAMINATION: VITAL SIGNS:  Temperature 97.5, blood pressure 113/72, pulse 123, O2 saturation 94% on oxygen, respirations 24.  GENERAL:  The patient is alert, awake, oriented, in no acute distress.  HEENT:  Pupils round, equal, reactive to light and accommodation.  Moist oral mucosa.  Clear oropharynx.  NECK:  Supple.  No JVD or carotid bruit.  No lymphadenopathy.  No thyromegaly.  CARDIOVASCULAR:  S1 and S2.  Regular rate and rhythm.  No murmurs or gallops.  PULMONARY:  Bilateral air entry, bilateral crackles and rhonchi left side, breath sounds is weak.  No use of accessory muscle to breathe.  ABDOMEN:  Soft.  No distention or tenderness.  No organomegaly.  Bowel sounds present.  EXTREMITIES:  No edema, clubbing or cyanosis.  No calf tenderness.  Bilateral pedal pulses present.  SKIN:  No rash or jaundice.  NEUROLOGIC:  A and O x3.  No focal deficit.  Power 5/5.  Sensation intact.   LABORATORY DATA:  Chest x-ray showed a large rounded left lower lobe airspace opacity and right middle lobe airspace disease, most concerning for multilobar pneumonia.  There is a 12.3 mm nodular opacity in the right upper lobe concerning for an infectious or inflammatory etiology given the left lower lobe findings, needs to follow up radiography.  CK 18.  CBC is pending.  Glucose 78, BUN 52, creatinine 2.15, sodium 135, potassium 3.0, chloride **Note De-Identified [place] [facility]ation** 106, bicarb 18.  Troponin less than 0.02.   EKG:  Sinus tachycardia at 124 bpm.   IMPRESSION: 1.  Multilobar pneumonia, community-acquired pneumonia.  2.  Acute renal failure.  3.  Hypokalemia.  4.  Mild hyponatremia.  5.  Asthma.  6.  Tobacco abuse.  7.  Hypertension.   PLAN OF TREATMENT: 1.  The patient will be admitted to medical floor.  We will continue Levaquin, give nebulizer treatment p.r.n. and continue oxygen by nasal cannula.  Follow up CBC, blood culture, and sputum culture.  2.  I  will give potassium supplement and follow up potassium level and magnesium level.  3.  For acute renal failure, we will give normal saline IV.  Follow up BMP.  4.  For tobacco abuse, smoking cessation was counseled for five minutes.  We will give nicotine patch.   I discussed the patient's condition and plan of treatment with the patient and the patient's husband, mother.   TIME SPENT:  About 56 minutes.    ____________________________ Demetrios Loll, MD qc:ea D: 12/30/2013 14:52:18 ET T: 12/30/2013 17:35:01 ET JOB#: [id number]  cc: Demetrios Loll, MD, <Dictator> Demetrios Loll MD ELECTRONICALLY SIGNED 12/30/2013 18:21

## 2014-11-24 NOTE — Discharge Summary (Signed)
**Note De-Identified [place] [facility]** PATIENT NAME:  ISEL, SKUFCA MR#:  [id number] DATE OF BIRTH:  1960-08-26  DATE OF ADMISSION:  12/30/2013 DATE OF DISCHARGE:  01/15/2014  For final discharge summary, please attach both interim discharge summaries done on 5th June 2015 and 15th June 2015.   ____________________________ Ceasar Lund. Anselm Jungling, MD vgv:sb D: 01/15/2014 16:03:02 ET T: 01/15/2014 16:09:34 ET JOB#: [id number]  cc: Ceasar Lund. Anselm Jungling, MD, <Dictator> Vaughan Basta MD ELECTRONICALLY SIGNED 01/17/2014 9:09

## 2014-11-24 NOTE — Consult Note (Signed)
**Note De-Identified [place] [facility]** PATIENT NAME:  Haley [name], Haley [name] MR#:  [id number] DATE OF BIRTH:  04/08/1961  DATE OF CONSULTATION:  01/08/2014  REFERRING PHYSICIAN:   CONSULTING PHYSICIAN:  Cheral Marker. Ola Spurr, MD  REASON FOR CONSULTATION: Sepsis, strep pneumonia bacteremia and fungemia.   HISTORY OF PRESENT ILLNESS: This is a 54 year old female admitted May 30th with a multifocal pneumonia. Per the initial history and physical, she had been ill with cough, sputum and shortness of breath for 1 week. She had also had a headache and weakness and poor oral intake. In the Emergency Room, she was found to be hypoxic and a chest x-ray showed multifocal, multilobar pneumonia. Her kidneys had a creatinine of 2.1 at that time and her white count on admission was 1.8. She ended up growing Streptococcus pneumoniae in 1 of 2 blood culture bottles. She was seen by oncology as well for the lymphopenia and thrombocytopenia.   Her pulmonary function worsened and she required intubation June 2nd as well as had a right IJ line placed June 2nd. Since that time, she has remained quite ill and has had high fevers. Blood cultures done over the last several days have now come back positive for yeast. We are consulted for further management.   Per the family members who were in the room, the patient was in her relatively normal state of health prior. She did not work and slept a lot, but did tend to her garden and helped an elderly lady who lived nearby. [place] She has a history, per the family, of depression and anxiety and also had had 2 recent admissions over the last several years for GI issues.   PAST MEDICAL HISTORY: 1.  Hypertension.  2.  Asthma.  3.  Depression.  4.  Prior history of esophagitis.   PAST SURGICAL HISTORY: C-section.   FAMILY HISTORY: Unable to be obtained.   SOCIAL HISTORY: Per her family she smoked a pack a day for 20 years. They deny that she drank alcohol or used illicit drugs. She was living [place]  [place] with her husband.   ALLERGIES: SULFA DRUGS.   REVIEW OF SYSTEMS: Unable to be obtained.  MEDICATIONS: Antibiotics since admission: Ceftriaxone from May 30th through June 6th, Zosyn begun June 1st through present, vancomycin begun May 31st.   Current antibiotics include Zosyn only. She was started on micafungin when the result of her blood culture with candidemia came back.   Other medications include BuSpar, Celexa, Colace, morphine, Zyprexa, pantoprazole, folic acid, metoclopramide, Solu-Medrol 40 mg q. 12 hours.   PHYSICAL EXAMINATION: VITAL SIGNS: Temperature 97.5, pulse 90, blood pressure 82/53, respirations 25 on the vent and she is 97% on ventilator settings. Her T-max for the last 24 hours was 100.1. Yesterday's T-max was 102. On June 6th, temperature max was 100.6. Prior to that she had been afebrile.  GENERAL: She is quite ill appearing, lying in bed.  HEENT: Pupils equal, round, and reactive to light and accommodation. Extraocular movements are intact. She has an ET tube in place. She has a left neck line, which appears intact. She also has a chronic Foley.  NECK: Supple, unable to move it without resistance. There is no anterior cervical, posterior cervical or supraclavicular lymphadenopathy.  HEART: Tachy but regular. LUNGS: Coarse breath sounds bilaterally.  ABDOMEN: Mildly distended, soft, nontender.  EXTREMITIES: No clubbing, cyanosis or edema.  SKIN: No rash.  MUSCULOSKELETAL: No joint swelling or evidence of septic joints.   DIAGNOSTIC DATA: White **Note De-Identified [place] [facility]bfuscation** blood count currently is 15.4, hemoglobin 7.1, platelets 191,000. White blood count had peaked at 31.6 on June 3rd. Renal function shows a creatinine of 0.39. Liver tests show low albumin at 1.6, but otherwise normal. The patient has had a negative HIV test as well as ANA was positive in a homogenous pattern, 1:640. Rheumatoid arthritis factor was elevated at 16.9. Hepatitis C testing was negative.   Imaging: Chest  x-ray June 6th shows persistent patchy bilateral airspace disease suggesting multifocal pneumonia and a small left pleural effusion.   CT of the abdomen and pelvis done June 7th showed inflammatory changes surrounding the pancreas indicative of acute pancreatitis. There was also an ill-defined area of hypo enhancement in the pancreas. Severe multilobar airspace consolidation, most severe in the left lower lobe where areas of cavitation indicative of necrotizing pneumonia were present. There was moderate distention of the gallbladder. There was a small amount of ascites.   CT of the chest June 1st showed multilobar patchy ground-glass and airspace consolidation, worse on the left lower lobe, most consistent with multilobar pneumonia, likely reactive mediastinal lymph nodes.   IMPRESSION: A 54 year old female relatively healthy at baseline, admitted with severe multilobar pneumonia and Streptococcus pneumoniae bacteremia. She had profound leukopenia on admission, but  then had an aggressive  neutrophilic response. She has been septic and required intubation for respiratory distress as well. She had a line placed in her right neck and now has blood cultures with 1 drawn from the catheter growing Candida and 2 drawn peripherally negative. That line has been changed over to a new line at a different site. She has sputum culture only with yeast at this point. Of note, she is HIV negative and oncology has seen her for possible underlying myelodysplastic syndrome, but is holding on a bone marrow biopsy at this point.   I suspect she began with routine community-acquired Streptococcus pneumoniae bacteremia and pneumonia and has progressed from there. Her fungemia is likely related to the line. It does not appear she grew it from her peripheral blood cultures. The line has been changed.   RECOMMENDATIONS: 1.  At this point, continue Zosyn but we could consider switching to just ceftriaxone for the Streptococcus  pneumoniae.  2.  Continue micafungin until further identification of the yeast.  3.  I have placed an order for cryptococcal antigen of her serum. She did have an underlying immunodeficiency. She could be at risk for this.   Thank you for the consult. I would be glad to follow with you. This was a high level consult.   ____________________________ Cheral Marker. Ola Spurr, MD dpf:sb D: 01/08/2014 15:05:00 ET T: 01/08/2014 16:05:51 ET JOB#: [id number]  cc: Cheral Marker. Ola Spurr, MD, <Dictator> Miyah Hampshire Ola Spurr MD ELECTRONICALLY SIGNED 01/16/2014 6:49

## 2014-11-24 NOTE — Consult Note (Signed)
**Note De-Identified [place] [facility]ation** HEMATOLOGY followup note - patient continues on mechanical ventilation. Husband at bedside, denies known h/o cytopenias but states she has not had regular bloodwork in last 3 years. Per d/w nurse, no obvious bleeding issues. low grade temp. No diarrhea.on mech ventilation, sedated.        vitals - 100.1, 103, 25, 108/73, 99% on vent        lungs - decreased BS b/l, more so in RLL area       abd - soft, BS +       skin - no major bruising- WBC 25.9, ANC 37628, Hb 9.4, platelets 22, INR 1.2, PTT 25.4, F-gen > 750, FDP 10-40. Cr 1.31, calcium normal. Recent serum iron 12, iron saturation 7%, LDH 258, HBsAg, HCV Ab, HIV Ab unremarkable. Blood c/s gram positive diplococci. [place] abd negative for hepatosplenomegaly. Has mildly elevated RA factor.  Impression/Recommendations: 54 year old female patient with no known history of low blood counts in the past, no history of alcohol abuse, chronic liver disease or splenomegaly who has been admitted with one week's duration of respiratory symptoms and found to have multilobar pneumonia. The patient is currently on broad-spectrum antibiotic coverage with IV Rocephin and vancomycin. Blood culture is growing diplococci. She had significant leukopenia at admission but now has developed neutrophilic leucocytosis, WBC higher today at 25.9, this could be from ongoing acute illness and steroid effect. Platelet count is further dropped to 22K, likely from ongoing sepsis and now evidence of mild DIC. No obvious bleeding issues, continue to monitor and transfue platelets if count < 20K (or at higher counts if bleeding). For iron deficiency will give IV Veofer 300 mg today. Stool hemoccult is pending. Bone marrow biopsy was planned but will hold off until more stable. Monitor CBC daily while in hospital. Will continue to follow.  Electronic Signatures: Jonn Shingles (MD)  (Signed on 03-Jun-15 22:28)  Authored  Last Updated: 03-Jun-15 22:28 by Jonn Shingles (MD)

## 2014-11-24 NOTE — Consult Note (Signed)
**Note De-Identified [place] Obfuscation** PATIENT NAME:  Haley [name], Haley [name] MR#:  [id number] DATE OF BIRTH:  [birth year]  DATE OF CONSULTATION:  12/30/2013  REFERRING PHYSICIAN: Dr. Bridgett Larsson  CONSULTING PHYSICIAN:  Sheryl Towell R. Ma Hillock, MD  REASON FOR CONSULTATION: Neutropenia and thrombocytopenia.   HISTORY OF PRESENT ILLNESS: The patient is a 54 year old female with a past medical history significant for hypertension, depression, esophagitis, asthma, who has been admitted to hospital with symptoms of cough, sputum, weakness and shortness of breath of one week's duration. She also has had some night sweats for the last week. She was found to be hypoxic in the ED. A chest x-ray suggestive of multilobar pneumonia. CBC done upon admission showed low white blood count of 1800, with 96% neutrophils, hemoglobin 14.2, platelets 111, creatinine was elevated at 2.15, BUN 52, LFTs unremarkable excepting AST of 89 and albumin of 2.4. The patient denies any known history of low blood counts in the past. In 2012 CBC on computer system reports normal WBC of 10,700, platelets 357, hemoglobin of 15.8.   The patient denies any changes in appetite or unintentional weight loss. Denies feeling any lymph node masses on self examination. She has no known history of malignancy in the past. She denies any known history of hepatitis or chronic liver disease or splenomegaly. Denies alcohol intake.   PAST MEDICAL/SURGICAL HISTORY: As in history of present illness above. In addition, the patient is status post C-section.   FAMILY HISTORY: Denies malignancy or hematological disorders. Noncontributory.   SOCIAL HISTORY: Chronic smoker 1 pack per day x 20 years. Denies alcohol or recreational drug usage. Tries to remain physically active.   ALLERGIES: INCLUDE SULFA.   HOME MEDICATIONS: Omeprazole 20 mg daily, Zyprexa 5 mg at bedtime, citalopram 20 mg b.i.d., BuSpar 15 mg t.i.d.   REVIEW OF SYSTEMS:  CONSTITUTIONAL: As in history of present illness. Currently denies fevers.   HEENT: Has mild headache. No dizziness, epistaxis, ear or jaw pain.  CARDIAC: Has generalized chest pain, especially worse on coughing and deep breathing. Otherwise, no palpitations, orthopnea, or PND.  LUNGS: As in history of present illness. No hemoptysis or wheezing.  GASTROINTESTINAL: No nausea, vomiting, or diarrhea. No constipation. No bright red blood in stools or melena.  GENITOURINARY: No dysuria or hematuria.  SKIN: No rashes or pruritus.  HEMATOLOGIC: Denies bleeding symptoms.  EXTREMITIES: No new pain or swelling.  MUSCULOSKELETAL: No new bone pains.  NEUROLOGIC: No new focal weakness, seizures or loss of consciousness.  ENDOCRINE: No polyuria or polydipsia.   PHYSICAL EXAMINATION: GENERAL: The patient is a moderately built and nourished individual, weak-looking, sitting in bed, otherwise alert and oriented and converses appropriately. On nasal cannula oxygen.  VITAL SIGNS: 97.9, 115/20, 98/67, 94% on 4 liters.  HEENT: Normocephalic, atraumatic. Extraocular movements intact. Sclerae anicteric. No oral thrush. Mouth is dry.  NECK: Negative for lymphadenopathy.  CARDIOVASCULAR: S1, S2, regular rate and rhythm, mildly tachycardic.  LUNGS: Lungs show bilateral diminished breath sounds overall. There is few rhonchi and bilateral crepitations present.  ABDOMEN: Soft. No hepatosplenomegaly or tenderness. The bowel sounds present.  EXTREMITIES: No major edema or cyanosis.  SKIN: No generalized rashes or major bruising.  NEUROLOGIC: Limited examination. Cranial nerves seem intact. Moves all extremities spontaneously.  MUSCULOSKELETAL: No obvious joint redness or swelling.   LABORATORY AND DIAGNOSTIC: Creatinine 2.15, BUN 52, potassium 3.0, calcium 8.2. LFTs unremarkable except AST elevated at 89 and albumin low at 2.4, troponin I less than 0.02. Blood culture growing gram-positive diplococci. CBC showed WBC low at 1800, with **Note De-Identified [place] Obfuscation** 96.9% neutrophils, hemoglobin 14.2, platelets 111, MCV 91.  Chest x-ray reported large rounded left lower lobe airspace opacity and right middle lobe airspace disease, most concerning for multilobar pneumonia. 12.3 mm nodular opacity in the right upper lobe concerning for infectious or inflammatory etiology given the left lower lobe findings.   IMPRESSION AND RECOMMENDATIONS: A 54 year old female patient with no known history of low blood counts in the past, no history of alcohol abuse, chronic liver disease or splenomegaly who has been admitted with one week's duration of respiratory symptoms and found to have multilobar pneumonia. The patient is currently on broad-spectrum antibiotic coverage with IV Rocephin and vancomycin. Blood culture is growing diplococci so far, sensitivities pending. She has significant leukopenia but has adequate absolute neutrophil count since she has 96.9% neutrophils, platelet count is low at 111, and hemoglobin normal at 14.2. Etiology for her low blood counts is unclear, she needs further work-up for this. We will therefore draw labs to check for B12, folate, iron study, manual differential, serum LDH, reticulocyte count, ANA, rheumatoid factor, serum protein immunoelectrophoresis, HBsAg/HCV antibody/HIV antibody, PT/PTT/fibrinogen to look for DIC or coagulopathy. We will also request ultrasound of the upper abdomen to look at liver and spleen on Monday to rule out cirrhosis or hepatosplenomegaly. The patient explained above details and that sometimes cytopenias scan occurred with acute infection and could be transient also. We will continue to follow closely. Monitor CBC daily while in hospital. If it does not improve or if work-up indicates, may need to consider bone marrow biopsy soon to rule out underlying marrow disorder. The patient is agreeable to this plan.   Thank you for the referral. Please feel free to contact me if any additional questions.  ____________________________ Rhett Bannister Ma Hillock, MD srp:sg D: 12/31/2013 12:03:06  ET T: 12/31/2013 12:50:01 ET JOB#: [id number]  cc: Mahlet Jergens R. Ma Hillock, MD, <Dictator> Alveta Heimlich MD ELECTRONICALLY SIGNED 01/03/2014 8:50

## 2014-11-24 NOTE — Consult Note (Signed)
**Note De-Identified [place] Obfuscation** HEMATOLOGY followup note - continues on mechanical ventilation. Per d/w nurse, no obvious bleeding issues. no fever. No diarrhea.on mech ventilation, sedated.        vitals - 98, 111, 23, 140/84, 92% on vent       HEENT- no bleeding aroung ET tube or central line       lungs - decreased BS b/l, more so in RLL area       abd - soft        skin - no major bruising - WBC 16.4, ANC 15.7, Hb 8.4, platelets 218. Haptoglobin unremarkable. On 6/3 - INR 1.2, PTT 25.4, F-gen > 750, FDP 10-40. Cr 1.31, calcium normal. Recent serum iron 12, iron saturation 7%, LDH 258, HBsAg, HCV Ab, HIV Ab unremarkable. Blood c/s gram positive diplococci. [place] abd negative for hepatosplenomegaly. Has mildly elevated RA factor, ANA positive at 1:640 but ds-DNA negative. blood Flow Cytometry Interpretation:   1. Aberrant neutrophils.  2.Markedly increased CD4/T helper to CD8/T suppressor cell ratio is detected, see comment.  Flow Comment:                 neutrophil aberrancies can be seen in myelodysplasia but are not diagnostic findings. Bone marrow evaluation and cytogenetic studies are recommended if clinically indicated.receptor gene rearrangement studies are recommended on a fresh peripheral blood specimen to confirm or exclude a clonal T-cell process, if clinically indicated.  Impression/Recommendations: 54 year old female patient with no known history of low blood counts in the past, no history of alcohol abuse, chronic liver disease or splenomegaly who has been admitted with one week's duration of respiratory symptoms and found to have multilobar pneumonia. The patient is currently on broad-spectrum antibiotic coverage with IV Rocephin and vancomycin. Blood culture is growing diplococci. She had significant leukopenia at admission but now has developed neutrophilic leucocytosis, WBC steady today at 16.4, this could be from ongoing acute illness and steroid effect. Platelet count has also normalised. No obvious bleeding issues.  She has anemia likely from ongoing acute ciritcal illness, continue to monitor and transfue PRBC as indicated by labs/patient's condition. For iron deficiency, she has received 2 doses of IV Veofer 300 mg. Stool hemoccult is pending. Flow cytometry reports aberrant neutrophils raising possibility of myelodysplasia. Bone marrow biopsy was planned but will hold off until more stable. Monitor CBC daily while in hospital. Will continue to follow intermittently as indicated.  Electronic Signatures: Jonn Shingles (MD)  (Signed on 09-Jun-15 18:10)  Authored  Last Updated: 09-Jun-15 18:10 by Jonn Shingles (MD)

## 2014-11-24 NOTE — Consult Note (Signed)
**Note De-Identified [place] Obfuscation** VITAL SIGNS/ANCILLARY NOTES: **Vital Signs.:   11-Jun-15 01:00  Respirations Respirations 25   Assessment/Plan:  Assessment/Plan:  Assessment 1.7 x 1.6 ill-defined density in the uncinate process of the pancreas:  Lipase has been normal.  CA 19-9 normal.  No evidence of diffuse pancreatitis at this point.  I have discussed her care with Dr Evangeline Gula [facility] & our plan of care is below.   Plan Will need MRCP/MR abdomen with and without contrast in 4 to 6 weeks if the patient is able to tolerate MRI. Will sign off, Please call Dr Allen Norris if you have any questions or concerns   Electronic Signatures: Andria Meuse (NP)  (Signed 11-Jun-15 09:19)  Authored: VITAL SIGNS/ANCILLARY NOTES, Assessment/Plan   Last Updated: 11-Jun-15 09:19 by Andria Meuse (NP)

## 2014-11-24 NOTE — Consult Note (Signed)
**Note De-Identified [place] Obfuscation** PATIENT NAME:  Haley [name], Haley [name] MR#:  [id number] DATE OF BIRTH:  1960/09/11  DATE OF CONSULTATION:  01/08/2014  REFERRING PHYSICIAN:  Dr. Bridgette Habermann.  CONSULTING PHYSICIAN:  Andria Meuse, NP and Lucilla Lame, MD  PRIMARY CARE PHYSICIAN:  Nonlocal.   REASON FOR CONSULTATION: Pancreatitis, pancreatic lesion.    HISTORY OF PRESENT ILLNESS:  Haley [name] is a 54 year old Caucasian female who was admitted with hypoxic respiratory failure and necrotizing pneumonia who is currently intubated on the ventilator. She also had acute renal failure, anemia and thrombocytopenia. She has history of hypertension, asthma and depression. She had a CT scan of the abdomen and pelvis yesterday with IV contrast that showed inflammatory changes surrounding the pancreas and a 1.7 x 1.6  slightly ill-defined area of hypo-enhancement in the uncinate process of the pancreas, which is nonspecific. She also had severe multilobar airspace consolidation throughout the visualized lung bases, most severe in the left lower lobe where areas of cavitation indicate of necrotizing pneumonia. She had moderate distention of the gallbladder, but there were no gallstones, no gross inflammatory changes to suggest acute cholecystitis, mild intrahepatic biliary ductal dilation, common bile duct is normal, and a small amount of ascites, trace bilateral pleural effusions and diffuse body wall edema suggesting anasarca. Her lipase has been normal this admission. She had an ultrasound on 06/01, which showed a distended gallbladder and sludge without cholelithiasis or common bile duct dilation. She had fecal occult blood test negative. Her hemoglobin has dropped to 7.1 from 12. Her white blood cell count is 15.4. Last lipase was 44. Her LFTs have been normal except for total protein of 4.5 and albumin of 1.6. There were no family members present this morning to answer questions and the patient is sedated and nonverbal.   PAST MEDICAL AND SURGICAL HISTORY:   Hypertension, asthma, depression, esophagitis, pneumonia, C-section, acute renal failure.   MEDICATIONS PRIOR TO ADMISSION:  Buspirone 15 mg t.i.d., citalopram 20 mg daily, omeprazole 20 mg daily, Zyprexa 5 mg at bedtime.   ALLERGIES: SULFA CAUSES GI DISTRESS.   FAMILY HISTORY: Unable to obtain.   SOCIAL HISTORY:  A 20 pack-year of tobacco use. Denies any illicit or alcohol use in the medical record.    REVIEW OF SYSTEMS:  Unable to obtain.  PHYSICAL EXAMINATION: VITAL SIGNS: Temp 97.4, pulse 82, respirations 25, blood pressure 81/48, O2 sats 93% on the vent.  HEENT:  Sclerae clear, anicteric. Conjunctivae pale. Oropharynx with ET tube intact.  NECK:  Supple without any mass or thyromegaly.  CHEST:  Heart regular rate and rhythm with normal S1, S2, without murmurs, clicks, rubs or gallops.  LUNGS:  With coarse breath sounds bilaterally on vent.  ABDOMEN: Faint bowel sounds present. Abdomen is mildly distended. There is no rebound, tenderness or guarding. No hepatosplenomegaly or mass. Exam is limited.  EXTREMITIES: Trace pretibial edema bilaterally. No clubbing. No cyanosis.  SKIN: Without jaundice or rash.  NEUROLOGIC: The patient is sedated.  PSYCHIATRIC: The patient is sedated.   LABORATORY STUDIES: See HPI.   IMPRESSION:  Haley [name] is a 54 year old critically ill Caucasian female admitted with hypoxic respiratory failure secondary to necrotizing pneumonia, currently on mechanical ventilation, septic shock, acute renal failure, anemia and thrombocytopenia. She was found to have inflammatory changes about the pancreas and a 1.7 x 1.6 ill-defined density in the uncinate process of the pancreas, as well as small volume ascites, trace pleural effusions, anasarca and necrotizing pneumonia on CT. There is no evidence of cholelithiasis and her common **Note De-Identified [place] Obfuscation** bile duct is normal. Her lipase and LFTs are normal. Findings on CT could represent fatty infiltration of the pancreas or small focal pancreatitis  or mass. Again, her lipase is normal, so acute pancreatitis does not seem to be a major issue at this point. She should have repeat imaging in 4 to 6 weeks to allow inflammation to resolve with M.R.C.P. and MRI of the abdomen. I have discussed her care with Dr. Lucilla Lame and our plan of care is below.   PLAN: 1.  Continue supportive measures.  2.  Continue b.i.d. PPI.  3.  Follow up M.R.C.P. and MRI of abdomen with and without contrast in 4 to 6 weeks if the patient is able to tolerate MRI.   ____________________________ Andria Meuse, NP klj:[id number] D: 01/08/2014 12:41:04 ET T: 01/08/2014 12:56:32 ET JOB#: [id number]  cc: Andria Meuse, NP, <Dictator> Andria Meuse FNP ELECTRONICALLY SIGNED 02/02/2014 16:41

## 2014-11-24 NOTE — Consult Note (Signed)
**Note De-Identified [place] Obfuscation** HEMATOLOGY followup - continues on mechanical ventilation. Husband at bedside. Per d/w nurse, no obvious bleeding issues. low grade temp. No diarrhea.on mech ventilation, sedated.        vitals - 98, 106, 25, 102/65, 97% on vent        lungs - decreased BS b/l, more so in RLL area       abd - soft        skin - no major bruising - WBC 16.8, 86% neutrophils, 3% monos, 1% metamyelocytes, Hb 8.8, platelets 63. On 6/3 - INR 1.2, PTT 25.4, F-gen > 750, FDP 10-40. Cr 1.31, calcium normal. Recent serum iron 12, iron saturation 7%, LDH 258, HBsAg, HCV Ab, HIV Ab unremarkable. Blood c/s gram positive diplococci. [place] abd negative for hepatosplenomegaly. Has mildly elevated RA factor, ANA positive at 1:640 but ds-DNA negative. blood Flow Cytometry Interpretation:   1. Aberrant neutrophils.  2.Markedly increased CD4/T helper to CD8/T suppressor cell ratio is detected, see comment.  Flow Comment:                 neutrophil aberrancies can be seen in myelodysplasia but are not diagnostic findings. Bone marrow evaluation and cytogenetic studies are recommended if clinically indicated.receptor gene rearrangement studies are recommended on a fresh peripheral blood specimen to confirm or exclude a clonal T-cell process, if clinically indicated.  Impression/Recommendations: 54 year old female patient with no known history of low blood counts in the past, no history of alcohol abuse, chronic liver disease or splenomegaly who has been admitted with one week's duration of respiratory symptoms and found to have multilobar pneumonia. The patient is currently on broad-spectrum antibiotic coverage with IV Rocephin and vancomycin. Blood culture is growing diplococci. She had significant leukopenia at admission but now has developed neutrophilic leucocytosis, WBC better today at 16.8, this could be from ongoing acute illness and steroid effect. Platelet count is also improving and is 63K, likely from ongoing sepsis, mild DIC and  pre-existing thrombocytopenia. No obvious bleeding issues, continue to monitor and transfue platelets if count < 20K (or at higher counts if bleeding). For iron deficiency, will give second dose of IV Veofer 300 mg today. Stool hemoccult is pending. Flow cytometry reports aberrant neutrophils raising possibility of myelodysplasia. Bone marrow biopsy was planned but will hold off until more stable. Monitor CBC daily while in hospital. Will continue to follow intermittently as indicated.      Electronic Signatures: Jonn Shingles (MD)  (Signed on 05-Jun-15 14:40)  Authored  Last Updated: 05-Jun-15 14:40 by Jonn Shingles (MD)

## 2014-11-24 NOTE — Consult Note (Signed)
**Note De-Identified [place] Obfuscation** Brief Consult Note: Diagnosis: Fungemia, Strep Pna bacteremia and PNA.   Patient was seen by consultant.   Consult note dictated.   Recommend further assessment or treatment.   Orders entered.   Discussed with Attending MD.   Comments: Cont micafungin and zosyn Check crypto antigen serum Line already changed  consider ophtho eval  Check echo.  Electronic Signatures: Angelena Form (MD)  (Signed 08-Jun-15 22:15)  Authored: Brief Consult Note   Last Updated: 08-Jun-15 22:15 by Angelena Form (MD)

## 2014-11-24 NOTE — Consult Note (Signed)
**Note De-identified [place] Obfuscation** PATIENT NAME:  [name], [name] MR#:  [id number] DATE OF BIRTH:  10/23/1960  DATE OF CONSULTATION:  12/08/2013  REFERRING PHYSICIAN:   CONSULTING PHYSICIAN:  David P. Fitzgerald, MD  REASON FOR CONSULTATION: Sepsis, strep pneumonia bacteremia and fungemia.   HISTORY OF PRESENT ILLNESS: This is a 54-year-old female admitted May 30th with a multifocal pneumonia. Per the initial history and physical, she had been ill with cough, sputum and shortness of breath for 1 week. She had also had a headache and weakness and poor oral intake. In the Emergency Room, she was found to be hypoxic and a chest x-ray showed multifocal, multilobar pneumonia. Her kidneys had a creatinine of 2.1 at that time and her white count on admission was 1.8. She ended up growing Streptococcus pneumoniae in 1 of 2 blood culture bottles. She was seen by oncology as well for the lymphopenia and thrombocytopenia.   Her pulmonary function worsened and she required intubation June 2nd as well as had a right IJ line placed June 2nd. Since that time, she has remained quite ill and has had high fevers. Blood cultures done over the last several days have now come back positive for yeast. We are consulted for further management.   Per the family members who were in the room, the patient was in her relatively normal state of health prior. She did not work and slept a lot, but did tend to her garden and helped an elderly lady who lived nearby. [place] She has a history, per the family, of depression and anxiety and also had had 2 recent admissions over the last several years for GI issues.   PAST MEDICAL HISTORY: 1.  Hypertension.  2.  Asthma.  3.  Depression.  4.  Prior history of esophagitis.   PAST SURGICAL HISTORY: C-section.   FAMILY HISTORY: Unable to be obtained.   SOCIAL HISTORY: Per her family she smoked a pack a day for 20 years. They deny that she drank alcohol or used illicit drugs. She was living [place]  [place] with her husband.   ALLERGIES: SULFA DRUGS.   REVIEW OF SYSTEMS: Unable to be obtained.  MEDICATIONS: Antibiotics since admission: Ceftriaxone from May 30th through June 6th, Zosyn begun June 1st through present, vancomycin begun May 31st.   Current antibiotics include Zosyn only. She was started on micafungin when the result of her blood culture with candidemia came back.   Other medications include BuSpar, Celexa, Colace, morphine, Zyprexa, pantoprazole, folic acid, metoclopramide, Solu-Medrol 40 mg q. 12 hours.   PHYSICAL EXAMINATION: VITAL SIGNS: Temperature 97.5, pulse 90, blood pressure 82/53, respirations 25 on the vent and she is 97% on ventilator settings. Her T-max for the last 24 hours was 100.1. Yesterdays T-max was 102. On June 6th, temperature max was 100.6. Prior to that she had been afebrile.  GENERAL: She is quite ill appearing, lying in bed.  HEENT: Pupils equal, round, and reactive to light and accommodation. Extraocular movements are intact. She has an ET tube in place. She has a left neck line, which appears intact. She also has a chronic Foley.  NECK: Supple, unable to move it without resistance. There is no anterior cervical, posterior cervical or supraclavicular lymphadenopathy.  HEART: Tachy but regular. LUNGS: Coarse breath sounds bilaterally.  ABDOMEN: Mildly distended, soft, nontender.  EXTREMITIES: No clubbing, cyanosis or edema.  SKIN: No rash.  MUSCULOSKELETAL: No joint swelling or evidence of septic joints.   DIAGNOSTIC DATA: White  **Note De-Identified [place] Obfuscation** blood count currently is 15.4, hemoglobin 7.1, platelets 191,000. White blood count had peaked at 31.6 on June 3rd. Renal function shows a creatinine of 0.39. Liver tests show low albumin at 1.6, but otherwise normal. The patient has had a negative HIV test as well as ANA was positive in a homogenous pattern, 1:640. Rheumatoid arthritis factor was elevated at 16.9. Hepatitis C testing was negative.   Imaging: Chest  x-ray June 6th shows persistent patchy bilateral airspace disease suggesting multifocal pneumonia and a small left pleural effusion.   CT of the abdomen and pelvis done June 7th showed inflammatory changes surrounding the pancreas indicative of acute pancreatitis. There was also an ill-defined area of hypo enhancement in the pancreas. Severe multilobar airspace consolidation, most severe in the left lower lobe where areas of cavitation indicative of necrotizing pneumonia were present. There was moderate distention of the gallbladder. There was a small amount of ascites.   CT of the chest June 1st showed multilobar patchy ground-glass and airspace consolidation, worse on the left lower lobe, most consistent with multilobar pneumonia, likely reactive mediastinal lymph nodes.   IMPRESSION: 54 year old female relatively healthy at baseline, admitted with severe multilobar pneumonia and Streptococcus pneumoniae bacteremia. She had profound leukopenia on admission, but  (Dictation Anomaly) <<MISSING TEXT>> neutrophilic response. She has been septic and required intubation for respiratory distress as well. She had a line placed in her right neck and now has blood cultures with 1 drawn from the catheter growing Candida and 2 drawn peripherally negative. That line has been changed over to a new line at a different site. She has sputum culture only with yeast at this point. Of note, she is HIV negative and oncology has seen her for possible underlying myelodysplastic syndrome, but is holding on a bone marrow biopsy at this point.   I suspect she began with routine community-acquired Streptococcus pneumoniae bacteremia and pneumonia and has progressed from there. Her fungemia is likely related to the line. It does not appear she grew it from her peripheral blood cultures. The line has been changed.   RECOMMENDATIONS: 1.  At this point, continue Zosyn but we could consider switching to just ceftriaxone for the  Streptococcus pneumoniae.  2.  Continue micafungin until further identification of the yeast.  3.  I have placed an order for cryptococcal antigen of her serum. She did have an underlying immunodeficiency. She could be at risk for this.   Thank you for the consult. I would be glad to follow with you. This was a high level consult.   ____________________________ Cheral Marker. Ola Spurr, MD dpf:sb D: 01/08/2014 15:05:09 ET T: 01/08/2014 16:05:51 ET JOB#: [id number]  cc: Cheral Marker. Ola Spurr, MD, <Dictator>

## 2014-11-24 NOTE — Consult Note (Signed)
**Note De-Identified [place] Obfuscation** ONCOLOGY followup - still weak but overall cough and SOB better. No fevers.denies bleeding symptoms. No new pain issues. sitting in bed. NAD. No icterus.       vitals - afebrile, stable        lungs - decreased BS RLL area       abd - soft, NT- WBC 15.9, Hb 12, platelets 47, 98.7% neutrophils, Cr 1.26, calcium 7.7 Recent serum iron 12, iron saturation 7%, LDH 258. Blood c/s gram positive diplococci.  Impression/Recommendations: 54 year old female patient with no known history of low blood counts in the past, no history of alcohol abuse, chronic liver disease or splenomegaly who has been admitted with one week's duration of respiratory symptoms and found to have multilobar pneumonia. The patient is currently on broad-spectrum antibiotic coverage with IV Rocephin and vancomycin. Blood culture is growing diplococci so far, sensitivities pending. She had significant leukopenia but now has developed neutrophilic leucocytosis. However platelet count is lower at 47 but no bleeding issues. Hemoglobin low-normal. Etiology for her low blood counts is still unclear, work-up so far shows iron defieicny. Will get stool hemoccult x 2 and then start on oral iron. Will pursue bone marrow biopsy tomorrow to evaluate for underlying marrow disorder like myelodysplasia. Patient explained above, she is agreeable to this plan. Monitor CBC daily while in hospital.   Electronic Signatures: Jonn Shingles (MD)  (Signed on 01-Jun-15 22:40)  Authored  Last Updated: 01-Jun-15 22:40 by Jonn Shingles (MD)

## 2014-11-24 NOTE — Consult Note (Signed)
**Note De-Identified [place] Obfuscation** Chief Complaint:  Subjective/Chief Complaint Pt sedated on vent.  Husband & mother at bedside & multiple quetions answered.   VITAL SIGNS/ANCILLARY NOTES: **Vital Signs.:   09-Jun-15 07:00  Vital Signs Type Routine  Temperature Temperature (F) 97.5  Celsius 36.3  Pulse Pulse 60  Respirations Respirations 25  Systolic BP Systolic BP 97  Diastolic BP (mmHg) Diastolic BP (mmHg) 64  Mean BP 75  BP Source  if not from Vital Sign Device non-invasive  Pulse Ox % Pulse Ox % 95  Pulse Ox Activity Level  At rest  Oxygen Delivery Ventilator Assisted  Pulse Ox Heart Rate 59  CO2 Monitor CO2 Monitor 25   Brief Assessment:  GEN critically ill appearing, Sedated, family @ bedside   Cardiac Regular   Respiratory crackles  +on vent   Gastrointestinal details normal Soft  Nondistended  No gaurding  No rigidity  +faint BS x4   EXTR Trace pedal edema bilat   Additional Physical Exam Skin: pale, warm, dry   Lab Results: Routine Chem:  09-Jun-15 00:35   Glucose, Serum  106  BUN 14  Creatinine (comp)  0.36  Sodium, Serum 143  Potassium, Serum 3.7  Chloride, Serum  108  CO2, Serum 31  Calcium (Total), Serum  7.1  Anion Gap  4  Osmolality (calc) 286  eGFR (African American) >60  eGFR (Non-African American) >60 (eGFR values <44m/min/1.73 m2 may be an indication of chronic kidney disease (CKD). Calculated eGFR is useful in patients with stable renal function. The eGFR calculation will not be reliable in acutely ill patients when serum creatinine is changing rapidly. It is not useful in  patients on dialysis. The eGFR calculation may not be applicable to patients at the low and high extremes of body sizes, pregnant women, and vegetarians.)  Result Comment LABS - This specimen was collected through an   - indwelling catheter or arterial line.  - A minimum of 510m of blood was wasted prior    - to collecting the sample.  Interpret  - results with caution.  Result(s) reported on 09 Jan 2014 at 02:08AM.  Routine Hem:  09-Jun-15 00:35   WBC (CBC)  16.4  RBC (CBC)  2.83  Hemoglobin (CBC)  8.4  Hematocrit (CBC)  25.7  Platelet Count (CBC) 218  MCV 91  MCH 29.6  MCHC 32.5  RDW  16.7  Neutrophil % 95.6  Lymphocyte % 1.8  Monocyte % 2.3  Eosinophil % 0.2  Basophil % 0.1  Neutrophil #  15.7  Lymphocyte #  0.3  Monocyte # 0.4  Eosinophil # 0.0  Basophil # 0.0 (Result(s) reported on 09 Jan 2014 at 02:08AM.)   Assessment/Plan:  Assessment/Plan:  Assessment 1.7 x 1.6 ill-defined density in the uncinate process of the pancreas:  Lipase has been normal.  No evidence of diffuse pancreatitis at this point.   Pneumonia/Respiratory failure: per attending, on vent I have discussed her care with Dr. DaLucilla Lamend our plan of care is below.   Plan 1.  Continue supportive measures.  2.  Continue b.i.d. PPI.  3.  Follow up M.R.C.P. and MRI of abdomen with and without contrast in 4 to 6 weeks if the patient is able to tolerate MRI. Please call if you have any questions or concerns   Electronic Signatures: JoAndria MeuseNP)  (Signed 09-Jun-15 11:26)  Authored: Chief Complaint, VITAL SIGNS/ANCILLARY NOTES, Brief Assessment, Lab Results, Assessment/Plan   Last Updated: 09-Jun-15 11:26 by JoAndria MeuseNP)

## 2015-02-01 DIAGNOSIS — R1115 Cyclical vomiting syndrome unrelated to migraine: Secondary | ICD-10-CM | POA: Insufficient documentation

## 2015-02-01 DIAGNOSIS — J449 Chronic obstructive pulmonary disease, unspecified: Secondary | ICD-10-CM | POA: Insufficient documentation

## 2015-02-01 DIAGNOSIS — D75839 Thrombocytosis, unspecified: Secondary | ICD-10-CM | POA: Insufficient documentation

## 2015-02-01 DIAGNOSIS — E559 Vitamin D deficiency, unspecified: Secondary | ICD-10-CM | POA: Insufficient documentation

## 2015-02-01 DIAGNOSIS — D473 Essential (hemorrhagic) thrombocythemia: Secondary | ICD-10-CM | POA: Insufficient documentation

## 2015-02-01 DIAGNOSIS — I10 Essential (primary) hypertension: Secondary | ICD-10-CM | POA: Insufficient documentation

## 2015-02-01 DIAGNOSIS — J432 Centrilobular emphysema: Secondary | ICD-10-CM | POA: Insufficient documentation

## 2015-02-08 ENCOUNTER — Ambulatory Visit: Payer: Self-pay | Admitting: Family Medicine

## 2015-04-11 ENCOUNTER — Encounter: Payer: Self-pay | Admitting: Emergency Medicine

## 2015-04-11 ENCOUNTER — Other Ambulatory Visit: Payer: Self-pay

## 2015-04-11 ENCOUNTER — Emergency Department
Admission: EM | Admit: 2015-04-11 | Discharge: 2015-04-11 | Disposition: A | Discharge status: Home / Self Care | Payer: Commercial Indemnity | Attending: Emergency Medicine | Admitting: Emergency Medicine

## 2015-04-11 DIAGNOSIS — Z87891 Personal history of nicotine dependence: Secondary | ICD-10-CM | POA: Insufficient documentation

## 2015-04-11 DIAGNOSIS — F419 Anxiety disorder, unspecified: Secondary | ICD-10-CM

## 2015-04-11 DIAGNOSIS — I10 Essential (primary) hypertension: Secondary | ICD-10-CM | POA: Insufficient documentation

## 2015-04-11 DIAGNOSIS — R197 Diarrhea, unspecified: Secondary | ICD-10-CM | POA: Insufficient documentation

## 2015-04-11 DIAGNOSIS — R109 Unspecified abdominal pain: Secondary | ICD-10-CM | POA: Insufficient documentation

## 2015-04-11 DIAGNOSIS — F329 Major depressive disorder, single episode, unspecified: Secondary | ICD-10-CM | POA: Insufficient documentation

## 2015-04-11 HISTORY — DX: Cyclical vomiting syndrome unrelated to migraine: R11.15

## 2015-04-11 LAB — CBC
HEMATOCRIT: 45.8 % (ref 35.0–47.0)
Hemoglobin: 15.3 g/dL (ref 12.0–16.0)
MCH: 32.2 pg (ref 26.0–34.0)
MCHC: 33.5 g/dL (ref 32.0–36.0)
MCV: 95.9 fL (ref 80.0–100.0)
Platelets: 424 10*3/uL (ref 150–440)
RBC: 4.77 MIL/uL (ref 3.80–5.20)
RDW: 14 % (ref 11.5–14.5)
WBC: 9.9 10*3/uL (ref 3.6–11.0)

## 2015-04-11 LAB — URINALYSIS COMPLETE WITH MICROSCOPIC (ARMC ONLY)
Bilirubin Urine: NEGATIVE
GLUCOSE, UA: NEGATIVE mg/dL
Ketones, ur: NEGATIVE mg/dL
Leukocytes, UA: NEGATIVE
NITRITE: NEGATIVE
Protein, ur: NEGATIVE mg/dL
Specific Gravity, Urine: 1.014 (ref 1.005–1.030)
pH: 7 (ref 5.0–8.0)

## 2015-04-11 LAB — COMPREHENSIVE METABOLIC PANEL
ALBUMIN: 4.1 g/dL (ref 3.5–5.0)
ALK PHOS: 93 U/L (ref 38–126)
ALT: 28 U/L (ref 14–54)
AST: 40 U/L (ref 15–41)
Anion gap: 11 (ref 5–15)
BILIRUBIN TOTAL: 0.5 mg/dL (ref 0.3–1.2)
BUN: 6 mg/dL (ref 6–20)
CO2: 22 mmol/L (ref 22–32)
CREATININE: 0.66 mg/dL (ref 0.44–1.00)
Calcium: 9.4 mg/dL (ref 8.9–10.3)
Chloride: 108 mmol/L (ref 101–111)
GFR calc Af Amer: >60 mL/min (ref >60)
GFR calc non Af Amer: >60 mL/min (ref >60)
GLUCOSE: 126 mg/dL — AB (ref 65–99)
POTASSIUM: 3 mmol/L — AB (ref 3.5–5.1)
Sodium: 141 mmol/L (ref 135–145)
TOTAL PROTEIN: 7.3 g/dL (ref 6.5–8.1)

## 2015-04-11 LAB — LIPASE, BLOOD: Lipase: 19 U/L — ABNORMAL LOW (ref 22–51)

## 2015-04-11 MED ORDER — DIAZEPAM 5 MG PO TABS
10.0000 mg | ORAL_TABLET | Freq: Once | ORAL | Status: AC
  Administered 2015-04-11: 10 mg via ORAL
  Filled 2015-04-11: qty 2

## 2015-04-11 MED ORDER — BUSPIRONE HCL 15 MG PO TABS: 15.0000 mg | ORAL_TABLET | Freq: Three times a day (TID) | ORAL | Status: DC

## 2015-04-11 MED ORDER — SODIUM CHLORIDE 0.9 % IV SOLN
Freq: Once | INTRAVENOUS | Status: AC
  Administered 2015-04-11: 16:00:00 via INTRAVENOUS

## 2015-04-11 MED ORDER — POTASSIUM CHLORIDE CRYS ER 20 MEQ PO TBCR
EXTENDED_RELEASE_TABLET | ORAL | Status: AC
  Administered 2015-04-11: 40 meq via ORAL
  Filled 2015-04-11: qty 2

## 2015-04-11 MED ORDER — POTASSIUM CHLORIDE CRYS ER 20 MEQ PO TBCR
40.0000 meq | EXTENDED_RELEASE_TABLET | Freq: Once | ORAL | Status: AC
  Administered 2015-04-11: 40 meq via ORAL

## 2015-04-11 MED ORDER — CITALOPRAM HYDROBROMIDE 10 MG PO TABS: 10.0000 mg | ORAL_TABLET | Freq: Every day | ORAL | Status: DC

## 2015-04-11 MED ORDER — ONDANSETRON HCL 4 MG PO TABS: 4.0000 mg | ORAL_TABLET | Freq: Every day | ORAL | Status: DC | PRN

## 2015-04-11 MED ORDER — ONDANSETRON HCL 4 MG/2ML IJ SOLN
4.0000 mg | Freq: Once | INTRAMUSCULAR | Status: AC
  Administered 2015-04-11: 4 mg via INTRAVENOUS
  Filled 2015-04-11: qty 2

## 2015-04-11 MED ORDER — DIAZEPAM 5 MG PO TABS: 5.0000 mg | ORAL_TABLET | Freq: Three times a day (TID) | ORAL | Status: DC | PRN

## 2015-04-11 NOTE — Discharge Instructions (Signed)
**Note De-Identified [place] Obfuscation** Diarrhea Diarrhea is frequent loose and watery bowel movements. It can cause you to feel weak and dehydrated. Dehydration can cause you to become tired and thirsty, have a dry mouth, and have decreased urination that often is dark yellow. Diarrhea is a sign of another problem, most often an infection that will not last long. In most cases, diarrhea typically lasts 2-3 days. However, it can last longer if it is a sign of something more serious. It is important to treat your diarrhea as directed by your caregiver to lessen or prevent future episodes of diarrhea. CAUSES  Some common causes include:  Gastrointestinal infections caused by viruses, bacteria, or parasites.  Food poisoning or food allergies.  Certain medicines, such as antibiotics, chemotherapy, and laxatives.  Artificial sweeteners and fructose.  Digestive disorders. HOME CARE INSTRUCTIONS  Ensure adequate fluid intake (hydration): Have 1 cup (8 oz) of fluid for each diarrhea episode. Avoid fluids that contain simple sugars or sports drinks, fruit juices, whole milk products, and sodas. Your urine should be clear or pale yellow if you are drinking enough fluids. Hydrate with an oral rehydration solution that you can purchase at pharmacies, retail stores, and online. You can prepare an oral rehydration solution at home by mixing the following ingredients together:   - tsp table salt.   tsp baking soda.   tsp salt substitute containing potassium chloride.  1  tablespoons sugar.  1 L (34 oz) of water.  Certain foods and beverages may increase the speed at which food moves through the gastrointestinal (GI) tract. These foods and beverages should be avoided and include:  Caffeinated and alcoholic beverages.  High-fiber foods, such as raw fruits and vegetables, nuts, seeds, and whole grain breads and cereals.  Foods and beverages sweetened with sugar alcohols, such as xylitol, sorbitol, and mannitol.  Some foods may be well  tolerated and may help thicken stool including:  Starchy foods, such as rice, toast, pasta, low-sugar cereal, oatmeal, grits, baked potatoes, crackers, and bagels.  Bananas.  Applesauce.  Add probiotic-rich foods to help increase healthy bacteria in the GI tract, such as yogurt and fermented milk products.  Wash your hands well after each diarrhea episode.  Only take over-the-counter or prescription medicines as directed by your caregiver.  Take a warm bath to relieve any burning or pain from frequent diarrhea episodes. SEEK IMMEDIATE MEDICAL CARE IF:   You are unable to keep fluids down.  You have persistent vomiting.  You have blood in your stool, or your stools are black and tarry.  You do not urinate in 6-8 hours, or there is only a small amount of very dark urine.  You have abdominal pain that increases or localizes.  You have weakness, dizziness, confusion, or light-headedness.  You have a severe headache.  Your diarrhea gets worse or does not get better.  You have a fever or persistent symptoms for more than 2-3 days.  You have a fever and your symptoms suddenly get worse. MAKE SURE YOU:   Understand these instructions.  Will watch your condition.  Will get help right away if you are not doing well or get worse. Document Released: 07/10/2002 Document Revised: 12/04/2013 Document Reviewed: 03/27/2012 [facility] Patient Information 2015 [place], [place]. This information is not intended to replace advice given to you by your health care provider. Make sure you discuss any questions you have with your health care provider. Generalized Anxiety Disorder Generalized anxiety disorder (GAD) is a mental disorder. It interferes with life functions, including **Note De-Identified [place] Obfuscation** relationships, work, and school. GAD is different from normal anxiety, which everyone experiences at some point in their lives in response to specific life events and activities. Normal anxiety actually helps [place] prepare  for and get through these life events and activities. Normal anxiety goes away after the event or activity is over.  GAD causes anxiety that is not necessarily related to specific events or activities. It also causes excess anxiety in proportion to specific events or activities. The anxiety associated with GAD is also difficult to control. GAD can vary from mild to severe. People with severe GAD can have intense waves of anxiety with physical symptoms (panic attacks).  SYMPTOMS The anxiety and worry associated with GAD are difficult to control. This anxiety and worry are related to many life events and activities and also occur more days than not for 6 months or longer. People with GAD also have three or more of the following symptoms (one or more in children):  Restlessness.   Fatigue.  Difficulty concentrating.   Irritability.  Muscle tension.  Difficulty sleeping or unsatisfying sleep. DIAGNOSIS GAD is diagnosed through an assessment by your health care provider. Your health care provider will ask you questions aboutyour mood,physical symptoms, and events in your life. Your health care provider may ask you about your medical history and use of alcohol or drugs, including prescription medicines. Your health care provider may also do a physical exam and blood tests. Certain medical conditions and the use of certain substances can cause symptoms similar to those associated with GAD. Your health care provider may refer you to a mental health specialist for further evaluation. TREATMENT The following therapies are usually used to treat GAD:   Medication. Antidepressant medication usually is prescribed for long-term daily control. Antianxiety medicines may be added in severe cases, especially when panic attacks occur.   Talk therapy (psychotherapy). Certain types of talk therapy can be helpful in treating GAD by providing support, education, and guidance. A form of talk therapy called  cognitive behavioral therapy can teach you healthy ways to think about and react to daily life events and activities.  Stress managementtechniques. These include yoga, meditation, and exercise and can be very helpful when they are practiced regularly. A mental health specialist can help determine which treatment is best for you. Some people see improvement with one therapy. However, other people require a combination of therapies. Document Released: 11/14/2012 Document Revised: 12/04/2013 Document Reviewed: 11/14/2012 [facility] Patient Information 2015 [place], [place]. This information is not intended to replace advice given to you by your health care provider. Make sure you discuss any questions you have with your health care provider.

## 2015-04-11 NOTE — ED Provider Notes (Signed)
**Note De-Identified [place] Obfuscation** [facility] Emergency Department Provider Note     Time seen: ----------------------------------------- 2:55 PM on 04/11/2015 -----------------------------------------    I have reviewed the triage vital signs and the nursing notes.   HISTORY  Chief Complaint Diarrhea    HPI Haley [name] is a 54 y.o. female who presents to the ERfor diarrhea and vomiting for 2 weeks. Patient reports she is able to keep fluids down but has very poor appetite. She does have pain in her lower abdomen, feels a heart sounds. She states she has a history of anxiety and depression. She no longer takes medicine for this, denies fevers chills or other complaints. Patient reports feeling very anxious for the last 2 months.   Past Medical History  Diagnosis Date  . Hypertension   . Vitamin D deficiency disease   . Cyclical vomiting   . COPD (chronic obstructive pulmonary disease)   . Thrombocytosis   . Osteopenia Nov 2015  . GERD (gastroesophageal reflux disease)   . Dysphagia   . CHF (congestive heart failure)   . Anemia   . Anxiety   . Chronic pain syndrome   . Tachycardia   . Leukocytosis   . History of pneumonia   . History of sepsis   . Cyclic vomiting syndrome     Patient Active Problem List   Diagnosis Date Noted  . Hypertension   . Vitamin D deficiency disease   . Cyclical vomiting   . COPD (chronic obstructive pulmonary disease)   . Thrombocytosis   . Osteopenia 06/03/2014    Past Surgical History  Procedure Laterality Date  . Cesarean section      x 2    Allergies Sulfur  Social History Social History  Substance Use Topics  . Smoking status: Former Research scientist (life sciences)  . Smokeless tobacco: Never Used  . Alcohol Use: No     Comment: occasional    Review of Systems Constitutional: Negative for fever. Eyes: Negative for visual changes. ENT: Negative for sore throat. Cardiovascular: Negative for chest pain. Respiratory: Negative for shortness  of breath. Gastrointestinal: Positive for abdominal pain, vomiting and diarrhea Genitourinary: Negative for dysuria. Musculoskeletal: Negative for back pain. Skin: Negative for rash. Neurological: Negative for headaches, focal weakness or numbness. Psychiatric: Positive for anxiety and depression  10-point ROS otherwise negative.  ____________________________________________   PHYSICAL EXAM:  VITAL SIGNS: ED Triage Vitals  Enc Vitals Group     BP 04/11/15 1248 133/81 mmHg     Pulse Rate 04/11/15 1248 98     Resp 04/11/15 1248 16     Temp 04/11/15 1248 99 F (37.2 C)     Temp Source 04/11/15 1248 Oral     SpO2 04/11/15 1248 98 %     Weight --      Height 04/11/15 1248 5' (1.524 m)     Head Cir --      Peak Flow --      Pain Score 04/11/15 1249 0     Pain Loc --      Pain Edu? --      Excl. in [place]? --     Constitutional: Alert and oriented. Well appearing and in no distress. Patient tearful Eyes: Conjunctivae are normal. PERRL. Normal extraocular movements. ENT   Head: Normocephalic and atraumatic.   Nose: No congestion/rhinnorhea.   Mouth/Throat: Mucous membranes are moist.   Neck: No stridor. Cardiovascular: Normal rate, regular rhythm. Normal and symmetric distal pulses are present in all extremities. No murmurs, rubs, or **Note De-Identified [place] Obfuscation** gallops. Respiratory: Normal respiratory effort without tachypnea nor retractions. Breath sounds are clear and equal bilaterally. No wheezes/rales/rhonchi. Gastrointestinal: Soft and nontender. No distention. No abdominal bruits.  Musculoskeletal: Nontender with normal range of motion in all extremities. No joint effusions.  No lower extremity tenderness nor edema. Neurologic:  Normal speech and language. No gross focal neurologic deficits are appreciated. Speech is normal. No gait instability. Skin:  Skin is warm, dry and intact. No rash noted. Psychiatric: Depressed mood and affect, patient cries  easily. ____________________________________________  EKG: Interpreted by me. Sinus tachycardia with rate of 104 bpm, normal PR interval, normal QS with, normal QT interval.  ____________________________________________  ED COURSE:  Pertinent labs & imaging results that were available during my care of the patient were reviewed by me and considered in my medical decision making (see chart for details). Patient is in no acute distress, will check abdominal labs, give anxiolytics and reevaluate ____________________________________________    LABS (pertinent positives/negatives)  Labs Reviewed  LIPASE, BLOOD - Abnormal; Notable for the following:    Lipase 19 (*)    All other components within normal limits  COMPREHENSIVE METABOLIC PANEL - Abnormal; Notable for the following:    Potassium 3.0 (*)    Glucose, Bld 126 (*)    All other components within normal limits  URINALYSIS COMPLETEWITH MICROSCOPIC ([facility] ONLY) - Abnormal; Notable for the following:    Color, Urine AMBER (*)    APPearance CLOUDY (*)    Hgb urine dipstick 1+ (*)    Bacteria, UA RARE (*)    Squamous Epithelial / LPF 0-5 (*)    All other components within normal limits  CBC    ____________________________________________  FINAL ASSESSMENT AND PLAN  Abdominal pain, diarrhea, anxiety  Plan: Patient with labs and imaging as dictated above. Patient with labs within normal limits, she was given Valium as well as IV fluids and antiemetics. She particular needs anxiolytics to take at home, she is stable for outpatient follow-up with her doctor   Earleen Newport, MD   Earleen Newport, MD 04/11/15 [phone]

## 2015-04-11 NOTE — ED Notes (Signed)
**Note De-Identified [place] Obfuscation** Has had diarrhea and vomiting for 2 weeks.  Reports able to keep fluids down.  Pain to lower abdomen.  Feels like heart jumps.

## 2015-04-24 ENCOUNTER — Encounter: Payer: Self-pay | Admitting: Family Medicine

## 2015-04-24 ENCOUNTER — Ambulatory Visit (INDEPENDENT_AMBULATORY_CARE_PROVIDER_SITE_OTHER): Payer: Commercial Indemnity | Admitting: Family Medicine

## 2015-04-24 VITALS — BP 116/78 | HR 97 | Temp 99.4°F | Ht 59.5 in | Wt 90.0 lb

## 2015-04-24 DIAGNOSIS — R1013 Epigastric pain: Secondary | ICD-10-CM | POA: Diagnosis not present

## 2015-04-24 DIAGNOSIS — E876 Hypokalemia: Secondary | ICD-10-CM | POA: Diagnosis not present

## 2015-04-24 DIAGNOSIS — G43A1 Cyclical vomiting, intractable: Secondary | ICD-10-CM | POA: Diagnosis not present

## 2015-04-24 DIAGNOSIS — J449 Chronic obstructive pulmonary disease, unspecified: Secondary | ICD-10-CM | POA: Diagnosis not present

## 2015-04-24 DIAGNOSIS — R1115 Cyclical vomiting syndrome unrelated to migraine: Secondary | ICD-10-CM

## 2015-04-24 DIAGNOSIS — F331 Major depressive disorder, recurrent, moderate: Secondary | ICD-10-CM

## 2015-04-24 DIAGNOSIS — D75839 Thrombocytosis, unspecified: Secondary | ICD-10-CM

## 2015-04-24 DIAGNOSIS — R634 Abnormal weight loss: Secondary | ICD-10-CM | POA: Diagnosis not present

## 2015-04-24 DIAGNOSIS — D473 Essential (hemorrhagic) thrombocythemia: Secondary | ICD-10-CM

## 2015-04-24 DIAGNOSIS — E559 Vitamin D deficiency, unspecified: Secondary | ICD-10-CM | POA: Diagnosis not present

## 2015-04-24 HISTORY — DX: Hypokalemia: E87.6

## 2015-04-24 HISTORY — DX: Major depressive disorder, recurrent, moderate: F33.1

## 2015-04-24 MED ORDER — SUCRALFATE 1 GM/10ML PO SUSP: 1.0000 g | Freq: Three times a day (TID) | ORAL | Status: DC

## 2015-04-24 MED ORDER — ONDANSETRON 8 MG PO TBDP: 8.0000 mg | ORAL_TABLET | Freq: Three times a day (TID) | ORAL | Status: DC | PRN

## 2015-04-24 MED ORDER — CITALOPRAM HYDROBROMIDE 20 MG PO TABS: 20.0000 mg | ORAL_TABLET | Freq: Every day | ORAL | Status: DC

## 2015-04-24 MED ORDER — RANITIDINE HCL 300 MG PO TABS: 300.0000 mg | ORAL_TABLET | Freq: Every day | ORAL | Status: DC

## 2015-04-24 NOTE — Assessment & Plan Note (Addendum)
**Note De-Identified [place] Obfuscation** Refer to GI; use zofran; may use carafate and increase citalopram and use bsuporine; check K+

## 2015-04-24 NOTE — Assessment & Plan Note (Signed)
**Note De-Identified [place] Obfuscation** Just checked in ER and resolved
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
## 2015-04-24 NOTE — Assessment & Plan Note (Signed)
**Note De-identified Bahar Obfuscation** Check level today 

## 2015-04-24 NOTE — Patient Instructions (Addendum)
**Note De-Identified Pruss Obfuscation** Use the new higher dose of anti-nausea medicine as directed We'll have you see the psychiatrist and the GI doctor --- help is on the way Increase the citalopram from 10 mg to 20 mg daily Stop the omeprazole and use 300 mg ranitidine for stomach acid Try elevating the head of the bed just enough to keep acid out of your esophagus while you sleep Avoid trigger foods Return in one week  Try the Relaxation Response (below)  Steps to Elicit the Relaxation Response The following is the technique reprinted with permission from Dr. Billie Ruddy book The Relaxation Response pages 162-163 1. Sit quietly in a comfortable position. 2. Close your eyes. 3. Deeply relax all your muscles,  beginning at your feet and progressing up to your face.  Keep them relaxed. 4. Breathe through your nose.  Become aware of your breathing.  As you breathe out, say the word, "one"*,  silently to yourself. For example,  breathe in ... out, "one",- in .. out, "one", etc.  Breathe easily and naturally. 5. Continue for 10 to 20 minutes.  You may open your eyes to check the time, but do not use an alarm.  When you finish, sit quietly for several minutes,  at first with your eyes closed and later with your eyes opened.  Do not stand up for a few minutes. 6. Do not worry about whether you are successful  in achieving a deep level of relaxation.  Maintain a passive attitude and permit relaxation to occur at its own pace.  When distracting thoughts occur,  try to ignore them by not dwelling upon them  and return to repeating "one."  With practice, the response should come with little effort.  Practice the technique once or twice daily,  but not within two hours after any meal,  since the digestive processes seem to interfere with  the elicitation of the Relaxation Response. * It is better to use a soothing, mellifluous sound, preferably with no meaning. or association, to avoid stimulation of unnecessary  thoughts - a mantra.

## 2015-04-25 ENCOUNTER — Telehealth: Payer: Self-pay | Admitting: Gastroenterology

## 2015-04-25 ENCOUNTER — Telehealth: Payer: Self-pay | Admitting: Family Medicine

## 2015-04-25 DIAGNOSIS — R824 Acetonuria: Secondary | ICD-10-CM

## 2015-04-25 DIAGNOSIS — R3129 Other microscopic hematuria: Secondary | ICD-10-CM

## 2015-04-25 LAB — COMPREHENSIVE METABOLIC PANEL
ALBUMIN: 5.2 g/dL (ref 3.5–5.5)
ALT: 18 IU/L (ref 0–32)
AST: 25 IU/L (ref 0–40)
Albumin/Globulin Ratio: 1.9 (ref 1.1–2.5)
Alkaline Phosphatase: 105 IU/L (ref 39–117)
BILIRUBIN TOTAL: 0.8 mg/dL (ref 0.0–1.2)
BUN / CREAT RATIO: 16 (ref 9–23)
BUN: 12 mg/dL (ref 6–24)
CALCIUM: 10.3 mg/dL — AB (ref 8.7–10.2)
CHLORIDE: 88 mmol/L — AB (ref 97–108)
CO2: 30 mmol/L — ABNORMAL HIGH (ref 18–29)
Creatinine, Ser: 0.77 mg/dL (ref 0.57–1.00)
GFR, EST AFRICAN AMERICAN: 101 mL/min/{1.73_m2} (ref >59)
GFR, EST NON AFRICAN AMERICAN: 87 mL/min/{1.73_m2} (ref >59)
GLUCOSE: 107 mg/dL — AB (ref 65–99)
Globulin, Total: 2.7 g/dL (ref 1.5–4.5)
Potassium: 3 mmol/L — ABNORMAL LOW (ref 3.5–5.2)
Sodium: 139 mmol/L (ref 134–144)
TOTAL PROTEIN: 7.9 g/dL (ref 6.0–8.5)

## 2015-04-25 LAB — UA/M W/RFLX CULTURE, ROUTINE
BILIRUBIN UA: NEGATIVE
GLUCOSE, UA: NEGATIVE
Leukocytes, UA: NEGATIVE
NITRITE UA: NEGATIVE
SPEC GRAV UA: 1.015 (ref 1.005–1.030)
UUROB: 0.2 mg/dL (ref 0.2–1.0)
pH, UA: 8.5 — ABNORMAL HIGH (ref 5.0–7.5)

## 2015-04-25 LAB — T4, FREE: Free T4: 1.6 ng/dL (ref 0.82–1.77)

## 2015-04-25 LAB — MICROSCOPIC EXAMINATION: Renal Epithel, UA: NONE SEEN /hpf

## 2015-04-25 LAB — TSH: TSH: 1.95 u[IU]/mL (ref 0.450–4.500)

## 2015-04-25 LAB — VITAMIN B12: VITAMIN B 12: 605 pg/mL (ref 211–946)

## 2015-04-25 LAB — AMYLASE: Amylase: 132 U/L — ABNORMAL HIGH (ref 31–124)

## 2015-04-25 LAB — VITAMIN D 25 HYDROXY (VIT D DEFICIENCY, FRACTURES): VIT D 25 HYDROXY: 20.3 ng/mL — AB (ref 30.0–100.0)

## 2015-04-25 LAB — MAGNESIUM: Magnesium: 2 mg/dL (ref 1.6–2.3)

## 2015-04-25 MED ORDER — POTASSIUM CHLORIDE 20 MEQ PO PACK: 20.0000 meq | PACK | Freq: Two times a day (BID) | ORAL | Status: DC

## 2015-04-25 NOTE — Telephone Encounter (Signed)
**Note De-Identified Deshong Obfuscation** Several abnormalities on her labs; I called and left detailed message on the voicemail; potassium low, ketones in urine, appears dehydrated; pancreas enzyme up; I will recommend she go to the ER tonight for fluids and recheck of labs we have not gotten her CT scan yet (perhaps it can be done in ER?) My official recommendation is to go to the ER; she can contact on-call provider to discuss if needed (perhaps she is feeling 90% better with new medicines and only needs KCl, e.g.)

## 2015-04-25 NOTE — Telephone Encounter (Signed)
**Note De-Identified Bentz Obfuscation** I have called patient to make an appointment per referral. No answer. I have left a detailed message. Could add patient in with Dr Allen Norris on 05/01/15 @ 8:30am.

## 2015-04-26 DIAGNOSIS — R3129 Other microscopic hematuria: Secondary | ICD-10-CM | POA: Insufficient documentation

## 2015-04-26 DIAGNOSIS — R824 Acetonuria: Secondary | ICD-10-CM | POA: Insufficient documentation

## 2015-04-26 DIAGNOSIS — R311 Benign essential microscopic hematuria: Secondary | ICD-10-CM | POA: Insufficient documentation

## 2015-04-26 HISTORY — DX: Other microscopic hematuria: R31.29

## 2015-04-26 NOTE — Telephone Encounter (Signed)
**Note De-Identified Wuebker Obfuscation** I spoke with patient; glad to reach her Explained about labs She'll drink gatorade; pick up the potassium; be liberal with salt We're still waiting on CT scan; amylase only mildly up Discussed red blood cells, casts, protein, ketones in urine; recheck at appt on Friday; go to ER for fluids if needed Start vit D3 2,000 iu daily; may need drops if unable to take pills

## 2015-04-26 NOTE — Telephone Encounter (Signed)
**Note De-Identified Marciel Obfuscation** Patient is not at home; husband passed phone to another women Opal Sidles" I believe) She would not give me patient's cell number, which I understand; she will call patient tell her to call me right back at [phone] I need to talk to patient to find out how she's doing; go over lab results; get her on potassium (I sent some to pharmacy yesterday); will have her go to ER for IV fluids if still dehydrated and not eating/drinking

## 2015-04-29 DIAGNOSIS — G8929 Other chronic pain: Secondary | ICD-10-CM | POA: Insufficient documentation

## 2015-04-29 DIAGNOSIS — R109 Unspecified abdominal pain: Secondary | ICD-10-CM

## 2015-04-29 DIAGNOSIS — R634 Abnormal weight loss: Secondary | ICD-10-CM | POA: Insufficient documentation

## 2015-04-29 HISTORY — DX: Unspecified abdominal pain: R10.9

## 2015-04-29 LAB — GLIA (IGA/G) + TTG IGA: Transglutaminase IgA: <2 U/mL (ref 0–3)

## 2015-04-29 NOTE — Assessment & Plan Note (Signed)
**Note De-Identified Righter Obfuscation** Supportive listening provided; discouraged use of benzo, as this may exacerbate depression; SSRI

## 2015-04-29 NOTE — Assessment & Plan Note (Signed)
**Note De-Identified Rudman Obfuscation** SABA on med list

## 2015-04-30 ENCOUNTER — Telehealth: Payer: Self-pay | Admitting: Family Medicine

## 2015-04-30 DIAGNOSIS — R1013 Epigastric pain: Secondary | ICD-10-CM

## 2015-04-30 DIAGNOSIS — R634 Abnormal weight loss: Secondary | ICD-10-CM

## 2015-04-30 NOTE — Telephone Encounter (Signed)
**Note De-Identified Skibicki Obfuscation** Pt called stated stomach doctor will not be able to see her until 05/22/15 and that would only be for lab work. Please contact her for further advise. Thanks.

## 2015-05-01 NOTE — Telephone Encounter (Signed)
**Note De-Identified Roback Obfuscation** See if she can see another GI doctor ASAP Also, I still haven't see the results of her abdominal imaging; that needs to get done if not already done

## 2015-05-01 NOTE — Assessment & Plan Note (Signed)
**Note De-Identified Geisen Obfuscation** Re-ordering CT scan

## 2015-05-01 NOTE — Telephone Encounter (Signed)
**Note De-Identified Hefferan Obfuscation** Routing to provider for advice/recommendations.

## 2015-05-01 NOTE — Assessment & Plan Note (Signed)
**Note De-Identified Shambley Obfuscation** Re-ordering CT scan

## 2015-05-01 NOTE — Telephone Encounter (Signed)
**Note De-Identified Bogue Obfuscation** Okay, then I am re-ordering the CT scan for ASAP; I'd like this done by the end of the day Friday please

## 2015-05-01 NOTE — Telephone Encounter (Signed)
**Note De-Identified Leason Obfuscation** Her exam has not been scheduled yet, you marked the priority as routine so this allows me to continue to move on the other exams or referrals that have higher priorities when emergent/urgent or stat is not marked. When exams are marked routine this allows for the exams to still be sent immediately when I get them but the scheduling department for radiology calls to schedule these patients.

## 2015-05-02 ENCOUNTER — Other Ambulatory Visit: Payer: Commercial Indemnity

## 2015-05-02 DIAGNOSIS — R824 Acetonuria: Secondary | ICD-10-CM

## 2015-05-02 DIAGNOSIS — R3129 Other microscopic hematuria: Secondary | ICD-10-CM

## 2015-05-03 ENCOUNTER — Telehealth: Payer: Self-pay | Admitting: Family Medicine

## 2015-05-03 ENCOUNTER — Ambulatory Visit: Payer: Commercial Indemnity | Admitting: Family Medicine

## 2015-05-03 ENCOUNTER — Other Ambulatory Visit: Payer: Self-pay | Admitting: Family Medicine

## 2015-05-03 ENCOUNTER — Telehealth: Payer: Self-pay

## 2015-05-03 ENCOUNTER — Ambulatory Visit
Admission: RE | Admit: 2015-05-03 | Discharge: 2015-05-03 | Disposition: A | Discharge status: Home / Self Care | Payer: Commercial Indemnity | Source: Ambulatory Visit | Attending: Family Medicine | Admitting: Family Medicine

## 2015-05-03 DIAGNOSIS — R3129 Other microscopic hematuria: Secondary | ICD-10-CM

## 2015-05-03 DIAGNOSIS — R1013 Epigastric pain: Secondary | ICD-10-CM | POA: Diagnosis present

## 2015-05-03 DIAGNOSIS — R634 Abnormal weight loss: Secondary | ICD-10-CM | POA: Insufficient documentation

## 2015-05-03 LAB — UA/M W/RFLX CULTURE, ROUTINE
BILIRUBIN UA: NEGATIVE
Glucose, UA: NEGATIVE
Ketones, UA: NEGATIVE
LEUKOCYTES UA: NEGATIVE
Nitrite, UA: NEGATIVE
PH UA: 7 (ref 5.0–7.5)
PROTEIN UA: NEGATIVE
Specific Gravity, UA: 1.01 (ref 1.005–1.030)
Urobilinogen, Ur: 4 mg/dL — ABNORMAL HIGH (ref 0.2–1.0)

## 2015-05-03 LAB — MICROSCOPIC EXAMINATION: WBC, UA: NONE SEEN /hpf (ref 0–?)

## 2015-05-03 MED ORDER — IOHEXOL 300 MG/ML  SOLN
75.0000 mL | Freq: Once | INTRAMUSCULAR | Status: AC | PRN
  Administered 2015-05-03: 75 mL via INTRAVENOUS

## 2015-05-03 NOTE — Telephone Encounter (Signed)
**Note De-Identified Megill Obfuscation** I'm ordering this scan again

## 2015-05-03 NOTE — Telephone Encounter (Signed)
**Note De-Identified Nath Obfuscation** I ordered the scan again

## 2015-05-03 NOTE — Assessment & Plan Note (Signed)
**Note De-Identified Reddy Obfuscation** Ordering CT scan again

## 2015-05-03 NOTE — Assessment & Plan Note (Signed)
**Note De-Identified Nanninga Obfuscation** Discussed blood in urine; she says her mother has blood occasionally in her urine as well; patient reports hx of bladder polyps 25 years ago; will refer to urologist

## 2015-05-03 NOTE — Telephone Encounter (Signed)
**Note De-Identified Guadamuz Obfuscation** Discussed scan results Atherosclerosis; will be important for [place] to work on her cholesterol at f/u She has been told 25 years ago that she had polyps in her bladder; she has had blood in the urine microscopically; no gross hematuria; refer to urologist She is feeling some better, medicines are working; she sees GI soon

## 2015-05-03 NOTE — Assessment & Plan Note (Signed)
**Note De-Identified Malburg Obfuscation** Ordering CT scan again

## 2015-05-03 NOTE — Telephone Encounter (Signed)
**Note De-Identified Cuadra Obfuscation** I am unable to change her CT order to STAT. Only you have that security clearance level to change it to STAT. If I cancel it out and reenter it, it cancels her appointment.

## 2015-05-06 ENCOUNTER — Ambulatory Visit: Payer: Commercial Indemnity

## 2015-05-06 ENCOUNTER — Ambulatory Visit (INDEPENDENT_AMBULATORY_CARE_PROVIDER_SITE_OTHER): Payer: Commercial Indemnity | Admitting: Family Medicine

## 2015-05-06 ENCOUNTER — Encounter: Payer: Self-pay | Admitting: Family Medicine

## 2015-05-06 VITALS — BP 110/72 | HR 63 | Temp 98.4°F | Ht 59.3 in | Wt 91.5 lb

## 2015-05-06 DIAGNOSIS — G43A0 Cyclical vomiting, not intractable: Secondary | ICD-10-CM | POA: Diagnosis not present

## 2015-05-06 DIAGNOSIS — R1115 Cyclical vomiting syndrome unrelated to migraine: Secondary | ICD-10-CM

## 2015-05-06 DIAGNOSIS — F331 Major depressive disorder, recurrent, moderate: Secondary | ICD-10-CM | POA: Diagnosis not present

## 2015-05-06 DIAGNOSIS — E876 Hypokalemia: Secondary | ICD-10-CM | POA: Diagnosis not present

## 2015-05-06 NOTE — Patient Instructions (Signed)
**Note De-Identified Angeletti Obfuscation** Return in 2 months, call before then if needed or if losing more weight De-stress your life and practice relaxation We'll have you see the gastroenterologist and urologist You can call the GI doctor and we'll refer you to the urologist If you have not heard anything from my staff in a week about any orders/referrals/studies from today, please contact us here to follow-up [phone]   Steps to Elicit the Relaxation Response The following is the technique reprinted with permission from Dr. Billie Ruddy book The Relaxation Response pages 162-163 1. Sit quietly in a comfortable position. 2. Close your eyes. 3. Deeply relax all your muscles,  beginning at your feet and progressing up to your face.  Keep them relaxed. 4. Breathe through your nose.  Become aware of your breathing.  As you breathe out, say the word, "one"*,  silently to yourself. For example,  breathe in ... out, "one",- in .. out, "one", etc.  Breathe easily and naturally. 5. Continue for 10 to 20 minutes.  You may open your eyes to check the time, but do not use an alarm.  When you finish, sit quietly for several minutes,  at first with your eyes closed and later with your eyes opened.  Do not stand up for a few minutes. 6. Do not worry about whether you are successful  in achieving a deep level of relaxation.  Maintain a passive attitude and permit relaxation to occur at its own pace.  When distracting thoughts occur,  try to ignore them by not dwelling upon them  and return to repeating "one."  With practice, the response should come with little effort.  Practice the technique once or twice daily,  but not within two hours after any meal,  since the digestive processes seem to interfere with  the elicitation of the Relaxation Response. * It is better to use a soothing, mellifluous sound, preferably with no meaning. or association, to avoid stimulation of unnecessary thoughts - a mantra.

## 2015-05-06 NOTE — Progress Notes (Signed)
**Note De-Identified Jorstad Obfuscation** BP 110/72 mmHg  Pulse 63  Temp(Src) 98.4 F (36.9 C)  Ht 4' 11.3" (1.506 m)  Wt 91 lb 8 oz (41.504 kg)  BMI 18.30 kg/m2  SpO2 97%   Subjective:    Patient ID: Haley [name], female    DOB: 06-18-1961, 54 y.o.   MRN: [id number]  HPI: Haley [name] is a 54 y.o. female  Chief Complaint  Patient presents with  . 1 week fuv    vomiting   She is feeling better She is living with her mother now Stress has improved Her bowel movements are doing better Appetite has improved She is living with her mother now; thinks stress with husband is major factor; she is safe, not in any physical abuse or harm or danger  Relevant past medical, surgical, family and social history reviewed and updated as indicated. Interim medical history since our last visit reviewed. Allergies and medications reviewed and updated.  Review of Systems  Gastrointestinal: Negative for blood in stool.  Genitourinary: Negative for hematuria.    Per HPI unless specifically indicated above     Objective:    BP 110/72 mmHg  Pulse 63  Temp(Src) 98.4 F (36.9 C)  Ht 4' 11.3" (1.506 m)  Wt 91 lb 8 oz (41.504 kg)  BMI 18.30 kg/m2  SpO2 97%  Wt Readings from Last 3 Encounters:  05/06/15 91 lb 8 oz (41.504 kg)  04/24/15 90 lb (40.824 kg)  12/28/14 98 lb (44.453 kg)    Physical Exam  Constitutional: She appears well-developed.  Thin, weight gain one+ pound since last visit  Eyes: No scleral icterus.  Cardiovascular: Normal rate and regular rhythm.   Pulmonary/Chest: Effort normal and breath sounds normal.  Abdominal: Soft. She exhibits no distension.  Psychiatric: She has a normal mood and affect. Her behavior is normal. Judgment and thought content normal.   Results for orders placed or performed in visit on 05/02/15  Microscopic Examination  Result Value Ref Range   WBC, UA None seen 0 -  5 /hpf   RBC, UA 3-10 (A) 0 -  2 /hpf   Epithelial Cells (non renal) 0-10 0 - 10 /hpf  UA/M w/rflx  Culture, Routine  Result Value Ref Range   Specific Gravity, UA 1.010 1.005 - 1.030   pH, UA 7.0 5.0 - 7.5   Color, UA Yellow Yellow   Appearance Ur Clear Clear   Leukocytes, UA Negative Negative   Protein, UA Negative Negative/Trace   Glucose, UA Negative Negative   Ketones, UA Negative Negative   RBC, UA 1+ (A) Negative   Bilirubin, UA Negative Negative   Urobilinogen, Ur 4.0 (H) 0.2 - 1.0 mg/dL   Nitrite, UA Negative Negative   Microscopic Examination See below:       Assessment & Plan:   Problem List Items Addressed This Visit      Digestive   Cyclical vomiting    Improving; will still have her see GI; medicines if needed        Other   Recurrent depressive disorder, current episode moderate ([place]) - Primary    Supportive listening; suggested counseling; discussed changes at home, safety, self-care, etc; medication for mood; f/u in 2 months      Hypokalemia    She has completed course of K+ and no longer vomiting or having diarrhea         Follow up plan: Return in about 2 months (around 07/06/2015) for follow-up.

## 2015-05-06 NOTE — Assessment & Plan Note (Signed)
**Note De-Identified Middlebrooks Obfuscation** She has completed course of K+ and no longer vomiting or having diarrhea

## 2015-05-09 ENCOUNTER — Encounter: Payer: Self-pay | Admitting: Family Medicine

## 2015-05-09 ENCOUNTER — Encounter: Payer: Self-pay | Admitting: *Deleted

## 2015-05-09 ENCOUNTER — Ambulatory Visit (INDEPENDENT_AMBULATORY_CARE_PROVIDER_SITE_OTHER): Payer: Commercial Indemnity | Admitting: Family Medicine

## 2015-05-09 VITALS — BP 107/72 | HR 82 | Temp 98.3°F | Wt 92.7 lb

## 2015-05-09 DIAGNOSIS — K029 Dental caries, unspecified: Secondary | ICD-10-CM

## 2015-05-09 DIAGNOSIS — K047 Periapical abscess without sinus: Secondary | ICD-10-CM

## 2015-05-09 DIAGNOSIS — S0993XA Unspecified injury of face, initial encounter: Secondary | ICD-10-CM | POA: Insufficient documentation

## 2015-05-09 HISTORY — DX: Dental caries, unspecified: K02.9

## 2015-05-09 MED ORDER — CITALOPRAM HYDROBROMIDE 20 MG PO TABS: 20.0000 mg | ORAL_TABLET | Freq: Every day | ORAL | Status: DC

## 2015-05-09 MED ORDER — PENICILLIN V POTASSIUM 500 MG PO TABS: 500.0000 mg | ORAL_TABLET | Freq: Four times a day (QID) | ORAL | Status: DC

## 2015-05-09 MED ORDER — BUSPIRONE HCL 15 MG PO TABS: 15.0000 mg | ORAL_TABLET | Freq: Three times a day (TID) | ORAL | Status: DC

## 2015-05-09 MED ORDER — HYDROCODONE-ACETAMINOPHEN 5-325 MG PO TABS: 1.0000 | ORAL_TABLET | Freq: Four times a day (QID) | ORAL | Status: DC | PRN

## 2015-05-09 NOTE — Progress Notes (Signed)
**Note De-Identified Gause Obfuscation** BP 107/72 mmHg  Pulse 82  Temp(Src) 98.3 F (36.8 C)  Wt 92 lb 11.2 oz (42.048 kg)  SpO2 96%   Subjective:    Patient ID: Haley [name], female    DOB: 1961-07-11, 54 y.o.   MRN: [id number]  HPI: Haley [name] is a 54 y.o. female  Chief Complaint  Patient presents with  . Dental Pain    she thinks she has an abcessed tooth. Has been hurting since Tuesday and can not find a dentist.   She does not have a dentist Needs an antibiotic Taking ibuprofen and acetaminophen just so she can stand it Hurting like this since Tuesday She has had an abscess before in the same tooth; it is broken off No night sweats No fevers Location:  Left side of the lower jaw, 2nd molar Quality:  Aching pain Onset:  Two days Severity:  Pretty severe Chronicity: just last two days Alleviating factors:  Tylenol and ibuprofen  Relevant past medical, surgical, family and social history reviewed and updated as indicated. Interim medical history since our last visit reviewed. Allergies and medications reviewed and updated.  Review of Systems  Constitutional: Negative for fever and diaphoresis.  HENT: Positive for dental problem and facial swelling. Negative for ear pain.   Gastrointestinal: Positive for nausea (improved, thinks it is from the ibuprofen she is taking right now).   Per HPI unless specifically indicated above     Objective:    BP 107/72 mmHg  Pulse 82  Temp(Src) 98.3 F (36.8 C)  Wt 92 lb 11.2 oz (42.048 kg)  SpO2 96%  Wt Readings from Last 3 Encounters:  05/09/15 92 lb 11.2 oz (42.048 kg)  05/06/15 91 lb 8 oz (41.504 kg)  04/24/15 90 lb (40.824 kg)    Physical Exam  Constitutional: She appears well-developed and well-nourished.  Weight up another pound over just the last 3 days  HENT:  Head: Normocephalic and atraumatic.  Left Ear: Hearing, tympanic membrane, external ear and ear canal normal. No drainage, swelling or tenderness. No mastoid tenderness. Tympanic  membrane is not injected, not perforated, not erythematous and not retracted.  No middle ear effusion. No decreased hearing is noted.  Nose: No rhinorrhea.  Mouth/Throat: Oropharynx is clear and moist and mucous membranes are normal. Abnormal dentition. Dental abscesses (along the base of tooth 19) and dental caries (tooth 19 broken off at the gum with decay evident) present.  Lymphadenopathy:       Head (right side): No submental, no submandibular, no preauricular and no posterior auricular adenopathy present.       Head (left side): Submandibular (shoddy) adenopathy present. No submental, no preauricular and no posterior auricular adenopathy present.    She has no cervical adenopathy.  Skin: No erythema.  No erythema along the left side of the face  Psychiatric: She has a normal mood and affect. Her behavior is normal. Thought content normal.      Assessment & Plan:   Problem List Items Addressed This Visit      Digestive   Dental abscess - Primary    Start antibiotics; warm compresses; pain medicine Rx'd; patient to call and schedule appt to see a dentist soon; reasons to seek medical care reviewed (facial swelling, fevers, etc.)      Pain due to dental caries    She is taking ibuprofen and tylenol; will give her rx instead for hydrocodone since the ibuprofen is tearing up her stomach; discussed risk of taking with **Note De-Identified Inboden Obfuscation** other controlled substances; do not mix with alcohol, anxiety pills, etc.; she agrees         Follow up plan: Return if symptoms worsen or fail to improve.  An after-visit summary was printed and given to the patient at [facility].  Please see the patient instructions which may contain other information and recommendations beyond what is mentioned above in the assessment and plan. Meds ordered this encounter  Medications  . penicillin v potassium (VEETID) 500 MG tablet    Sig: Take 1 tablet (500 mg total) by mouth 4 (four) times daily.    Dispense:  28 tablet     Refill:  0  . HYDROcodone-acetaminophen (NORCO/VICODIN) 5-325 MG tablet    Sig: Take 1 tablet by mouth every 6 (six) hours as needed for moderate pain.    Dispense:  12 tablet    Refill:  0

## 2015-05-09 NOTE — Patient Instructions (Signed)
**Note De-Identified Schafer Obfuscation** Start the new antibiotics Use the pain medicine if needed (do not mix with other pains, sleeping pills, anxiety pills, or alcohol) Use warm compresses to the side of the face 3-4 x a day Do make an appointment with a dentist

## 2015-05-09 NOTE — Assessment & Plan Note (Signed)
**Note De-Identified Ballweg Obfuscation** Supportive listening; suggested counseling; discussed changes at home, safety, self-care, etc; medication for mood; f/u in 2 months

## 2015-05-09 NOTE — Assessment & Plan Note (Signed)
**Note De-Identified Radde Obfuscation** Improving; will still have her see GI; medicines if needed

## 2015-05-09 NOTE — Assessment & Plan Note (Signed)
**Note De-Identified Dimiceli Obfuscation** She is taking ibuprofen and tylenol; will give her rx instead for hydrocodone since the ibuprofen is tearing up her stomach; discussed risk of taking with other controlled substances; do not mix with alcohol, anxiety pills, etc.; she agrees

## 2015-05-09 NOTE — Assessment & Plan Note (Signed)
**Note De-Identified Mcbrayer Obfuscation** Start antibiotics; warm compresses; pain medicine Rx'd; patient to call and schedule appt to see a dentist soon; reasons to seek medical care reviewed (facial swelling, fevers, etc.)

## 2015-05-10 ENCOUNTER — Ambulatory Visit (INDEPENDENT_AMBULATORY_CARE_PROVIDER_SITE_OTHER): Payer: BLUE CROSS/BLUE SHIELD | Admitting: Obstetrics and Gynecology

## 2015-05-10 ENCOUNTER — Encounter: Payer: Self-pay | Admitting: Obstetrics and Gynecology

## 2015-05-10 VITALS — BP 117/75 | HR 73 | Resp 16 | Ht 59.0 in | Wt 94.3 lb

## 2015-05-10 DIAGNOSIS — R3129 Other microscopic hematuria: Secondary | ICD-10-CM | POA: Diagnosis not present

## 2015-05-10 LAB — URINALYSIS, COMPLETE
BILIRUBIN UA: NEGATIVE
Glucose, UA: NEGATIVE
KETONES UA: NEGATIVE
LEUKOCYTES UA: NEGATIVE
NITRITE UA: NEGATIVE
Protein, UA: NEGATIVE
SPEC GRAV UA: 1.015 (ref 1.005–1.030)
Urobilinogen, Ur: 0.2 mg/dL (ref 0.2–1.0)
pH, UA: 6.5 (ref 5.0–7.5)

## 2015-05-10 LAB — MICROSCOPIC EXAMINATION
Bacteria, UA: NONE SEEN
EPITHELIAL CELLS (NON RENAL): NONE SEEN /HPF (ref 0–10)
Renal Epithel, UA: NONE SEEN /hpf
WBC, UA: NONE SEEN /hpf (ref 0–?)

## 2015-05-10 NOTE — Progress Notes (Signed)
**Note De-Identified Treu Obfuscation** 05/10/2015 10:52 AM   Haley [name] [birth year] [id number]  Referring provider: Arnetha Courser, MD [address] [place], [place] [address]  Chief Complaint  Patient presents with  . Hematuria  . Establish Care    HPI: Patient is a 54 year old female presenting today as a referral from her primary care provider with complaints of microscopic hematuria on multiple microscopic urinalyses. Patient reports a history of previous bladder polyps noted on cystoscopy critical 20 years ago. She has noted no gross hematuria. She has no urinary symptoms including dysuria, frequency, urgency or sensation of incomplete bladder emptying. She has no vaginal complaints.  She does have a 1 pack per day 30 years smoking history. She quit 2 years ago.  She reports adequate daily fluid intake. History of renal stones  04/24/15 Cr 0.77   PMH: Past Medical History  Diagnosis Date  . Hypertension   . Vitamin D deficiency disease   . Cyclical vomiting   . COPD (chronic obstructive pulmonary disease) ([place])   . Thrombocytosis ([place])   . Osteopenia Nov 2015  . GERD (gastroesophageal reflux disease)   . Dysphagia   . CHF (congestive heart failure) ([place])   . Anemia   . Anxiety   . Chronic pain syndrome   . Tachycardia   . Leukocytosis   . History of pneumonia   . History of sepsis   . Cyclic vomiting syndrome     Surgical History: Past Surgical History  Procedure Laterality Date  . Cesarean section      x 2    Home Medications:    Medication List       This list is accurate as of: 05/10/15 10:52 AM.  Always use your most recent med list.               amLODipine 5 MG tablet  Commonly known as:  NORVASC  Take 5 mg by mouth daily.     busPIRone 15 MG tablet  Commonly known as:  BUSPAR  Take 1 tablet (15 mg total) by mouth 3 (three) times daily.     citalopram 20 MG tablet  Commonly known as:  CELEXA  Take 1 tablet (20 mg total) by mouth daily.     HYDROcodone-acetaminophen 5-325  MG tablet  Commonly known as:  NORCO/VICODIN  Take 1 tablet by mouth every 6 (six) hours as needed for moderate pain.     metoprolol succinate 25 MG 24 hr tablet  Commonly known as:  TOPROL-XL  Take 25 mg by mouth daily.     penicillin v potassium 500 MG tablet  Commonly known as:  VEETID  Take 1 tablet (500 mg total) by mouth 4 (four) times daily.     PROAIR HFA 108 (90 BASE) MCG/ACT inhaler  Generic drug:  albuterol  Inhale 2 puffs into the lungs every 6 (six) hours as needed.     ranitidine 300 MG tablet  Commonly known as:  ZANTAC  Take 1 tablet (300 mg total) by mouth at bedtime.        Allergies:  Allergies  Allergen Reactions  . Sulfur Diarrhea and Nausea And Vomiting    Family History: Family History  Problem Relation Age of Onset  . Cancer Father     prostate  . Heart disease Father   . Heart attack Father   . Hypertension Father   . Stroke Neg Hx   . COPD Neg Hx     Social History:  reports that she quit **Note De-Identified Terhaar Obfuscation** smoking about 21 months ago. Her smoking use included Cigarettes. She has a 35 pack-year smoking history. She has never used smokeless tobacco. She reports that she does not drink alcohol or use illicit drugs.  ROS:                                        Physical Exam: BP 117/75 mmHg  Pulse 73  Resp 16  Ht 4\' 11"  (1.499 m)  Wt 94 lb 4.8 oz (42.774 kg)  BMI 19.04 kg/m2  Constitutional:  Alert and oriented, No acute distress. HEENT: [place] AT, moist mucus membranes.  Trachea midline, no masses. Cardiovascular: No clubbing, cyanosis, or edema. Respiratory: Normal respiratory effort, no increased work of breathing. GI: Abdomen is soft, nontender, nondistended, no abdominal masses GU: No CVA tenderness.  Skin: No rashes, bruises or suspicious lesions. Lymph: No cervical or inguinal adenopathy. Neurologic: Grossly intact, no focal deficits, moving all 4 extremities. Psychiatric: Normal mood and affect.  Laboratory  Data:   Urinalysis    Component Value Date/Time   COLORURINE AMBER* 04/11/2015 1307   COLORURINE Yellow 05/17/2014 2007   APPEARANCEUR CLOUDY* 04/11/2015 1307   APPEARANCEUR Clear 05/17/2014 2007   LABSPEC 1.014 04/11/2015 1307   LABSPEC 1.004 05/17/2014 2007   PHURINE 7.0 04/11/2015 1307   PHURINE 7.0 05/17/2014 2007   GLUCOSEU Negative 05/02/2015 1523   GLUCOSEU Negative 05/17/2014 2007   HGBUR 1+* 04/11/2015 1307   HGBUR 1+ 05/17/2014 2007   BILIRUBINUR Negative 05/02/2015 [place] NEGATIVE 04/11/2015 1307   BILIRUBINUR Negative 05/17/2014 2007   KETONESUR NEGATIVE 04/11/2015 [place] Negative 05/17/2014 2007   PROTEINUR NEGATIVE 04/11/2015 1307   PROTEINUR Negative 05/17/2014 2007   UROBILINOGEN 0.2 01/26/2014 1937   NITRITE Negative 05/02/2015 1523   NITRITE NEGATIVE 04/11/2015 1307   NITRITE Negative 05/17/2014 2007   LEUKOCYTESUR Negative 05/02/2015 1523   LEUKOCYTESUR NEGATIVE 04/11/2015 1307   LEUKOCYTESUR Negative 05/17/2014 2007    Pertinent Imaging:   Assessment & Plan:    1. Microscopic hematuria- UA  Demonstrating 3-10 RBCs seen on hpf. Otherwise unremarkable. We discussed the differential diagnosis for microscopic hematuria including nephrolithiasis, renal or upper tract tumors, bladder stones, UTIs, or bladder tumors as well as undetermined etiologies. Per AUA guidelines, I did recommend complete microscopic hematuria evaluation including CTU, possible urine cytology, and office cystoscopy.  Patient states understanding and would like to proceed with plan.  Patient denies any vaginal complaints. Vaginal exam deferred until cystoscopy appointment. - Urinalysis, Complete  Return for CT Urogram results; cystoscopy.  These notes generated with voice recognition software. I apologize for typographical errors.  Herbert Moors, [facility] [address], [place], [place] [address] [phone]

## 2015-05-16 ENCOUNTER — Telehealth: Payer: Self-pay | Admitting: Radiology

## 2015-05-16 ENCOUNTER — Other Ambulatory Visit: Payer: Self-pay | Admitting: Radiology

## 2015-05-16 DIAGNOSIS — R3129 Other microscopic hematuria: Secondary | ICD-10-CM

## 2015-05-16 NOTE — Telephone Encounter (Signed)
**Note De-Identified Cumming Obfuscation** Pt notified that per [employer], her coverage expired on 05/13/15. Pt states she will cb after speaking with her insurance company.

## 2015-05-28 ENCOUNTER — Other Ambulatory Visit: Payer: Self-pay | Admitting: Obstetrics and Gynecology

## 2015-05-28 DIAGNOSIS — R3129 Other microscopic hematuria: Secondary | ICD-10-CM

## 2015-05-30 ENCOUNTER — Other Ambulatory Visit: Payer: Commercial Indemnity | Admitting: Urology

## 2015-05-30 ENCOUNTER — Encounter: Payer: Self-pay | Admitting: Urology

## 2015-06-17 ENCOUNTER — Ambulatory Visit: Payer: Commercial Indemnity | Admitting: Gastroenterology

## 2015-07-08 ENCOUNTER — Ambulatory Visit (INDEPENDENT_AMBULATORY_CARE_PROVIDER_SITE_OTHER): Payer: BLUE CROSS/BLUE SHIELD | Admitting: Family Medicine

## 2015-07-08 ENCOUNTER — Encounter: Payer: Self-pay | Admitting: Family Medicine

## 2015-07-08 VITALS — BP 118/79 | HR 94 | Temp 98.5°F | Wt 92.0 lb

## 2015-07-08 DIAGNOSIS — F331 Major depressive disorder, recurrent, moderate: Secondary | ICD-10-CM | POA: Diagnosis not present

## 2015-07-08 DIAGNOSIS — J449 Chronic obstructive pulmonary disease, unspecified: Secondary | ICD-10-CM | POA: Diagnosis not present

## 2015-07-08 DIAGNOSIS — K047 Periapical abscess without sinus: Secondary | ICD-10-CM | POA: Diagnosis not present

## 2015-07-08 DIAGNOSIS — I10 Essential (primary) hypertension: Secondary | ICD-10-CM | POA: Diagnosis not present

## 2015-07-08 DIAGNOSIS — G43A0 Cyclical vomiting, not intractable: Secondary | ICD-10-CM | POA: Diagnosis not present

## 2015-07-08 DIAGNOSIS — Z23 Encounter for immunization: Secondary | ICD-10-CM | POA: Diagnosis not present

## 2015-07-08 DIAGNOSIS — R1115 Cyclical vomiting syndrome unrelated to migraine: Secondary | ICD-10-CM

## 2015-07-08 DIAGNOSIS — Z630 Problems in relationship with spouse or partner: Secondary | ICD-10-CM

## 2015-07-08 DIAGNOSIS — Z1239 Encounter for other screening for malignant neoplasm of breast: Secondary | ICD-10-CM

## 2015-07-08 DIAGNOSIS — R3129 Other microscopic hematuria: Secondary | ICD-10-CM | POA: Diagnosis not present

## 2015-07-08 HISTORY — DX: Encounter for other screening for malignant neoplasm of breast: Z12.39

## 2015-07-08 MED ORDER — TETANUS-DIPHTH-ACELL PERTUSSIS 5-2.5-18.5 LF-MCG/0.5 IM SUSP
0.5000 mL | Freq: Once | INTRAMUSCULAR | Status: AC
  Administered 2015-07-08: 0.5 mL via INTRAMUSCULAR

## 2015-07-08 MED ORDER — CITALOPRAM HYDROBROMIDE 20 MG PO TABS: 20.0000 mg | ORAL_TABLET | Freq: Every day | ORAL | Status: DC

## 2015-07-08 NOTE — Patient Instructions (Addendum)
**Note De-Identified Hockenberry Obfuscation** You received the vaccine to protect against tetanus and diphtheria and pertussis today; the tetanus and diphtheria portions will provide protection up to ten years, and the pertussis component will give you protection against whooping cough for life Please do call to schedule your mammogram; the number to schedule one at either [facility] or [facility] is [phone] Call me if needed

## 2015-07-08 NOTE — Progress Notes (Signed)
**Note De-Identified Bensinger Obfuscation** BP 118/79 mmHg  Pulse 94  Temp(Src) 98.5 F (36.9 C)  Wt 92 lb (41.731 kg)  SpO2 96%   Subjective:    Patient ID: Haley [name], female    DOB: Sep 19, 1960, 54 y.o.   MRN: [id number]  HPI: Haley [name] is a 54 y.o. female  Chief Complaint  Patient presents with  . Hypertension    needs refills on Amlodipine and Metoprolol  . Depression    needs refills on Citalopram. She has recently left her husband which helping her alot.  . Referral    she will call and get mammo scheduled, card given  . Referral    she will call Dr. Dorothey Baseman office and get her colonoscopy rescheduled.   She will wake up with diarrhea occasionally and gets vomiting episodes 2x a month or so; some tensness, then gets over it; she has talked to Dr. Allen Norris about it; not lasting as long, not as often; weight stable; appetite fair, comes and goes; does keep Ensure in the fridge  No lumps in breasts; needs order for mammogram  Citalopram doing well witih the anxiety and depression; has had nightmares since husband left; having sweats, crazy dreams Depression screen [facility] 2/9 07/08/2015  Decreased Interest 3  Down, Depressed, Hopeless 1  PHQ - 2 Score 4  Altered sleeping 3  Tired, decreased energy 3  Change in appetite 3  Feeling bad or failure about yourself  0  Trouble concentrating 0  Moving slowly or fidgety/restless 0  Suicidal thoughts 0  PHQ-9 Score 13  Difficult doing work/chores Somewhat difficult   COPD and hx of pneumonia; she is not using rescue inhaler at all; she needed it then after that pneumonia but not since then  She had a dental abscess, tooth #19; previous treated with antibiotics; she has been to see her dentist; had tooth pulled; better now  Relevant past medical, surgical, family and social history reviewed and updated as indicated.  Past Medical History  Diagnosis Date  . Hypertension   . Vitamin D deficiency disease   . Cyclical vomiting   . COPD (chronic obstructive  pulmonary disease) ([place])   . Thrombocytosis ([place])   . Osteopenia Nov 2015  . GERD (gastroesophageal reflux disease)   . Dysphagia   . CHF (congestive heart failure) ([place])   . Anemia   . Anxiety   . Chronic pain syndrome   . Tachycardia   . Leukocytosis   . History of pneumonia   . History of sepsis   . Cyclic vomiting syndrome    Past Surgical History  Procedure Laterality Date  . Cesarean section      x 2    Interim medical history since our last visit reviewed.   reports that she quit smoking about 23 months ago. Her smoking use included Cigarettes. She has a 35 pack-year smoking history. She has never used smokeless tobacco. She reports that she does not drink alcohol or use illicit drugs. Social History   Social History Narrative   Separated from husband Fall 2016   Allergies and medications reviewed and updated.  Review of Systems Per HPI unless specifically indicated above     Objective:    BP 118/79 mmHg  Pulse 94  Temp(Src) 98.5 F (36.9 C)  Wt 92 lb (41.731 kg)  SpO2 96%  Wt Readings from Last 3 Encounters:  07/08/15 92 lb (41.731 kg)  05/10/15 94 lb 4.8 oz (42.774 kg)  05/09/15 92 lb 11.2 oz (42.048 kg) **Note De-Identified Zerby Obfuscation** Physical Exam  Constitutional: She appears well-developed and well-nourished. No distress.  Weight overall stable over last two months; thin and small framed  HENT:  Head: Normocephalic and atraumatic.  Nose: No rhinorrhea.  Mouth/Throat: Oropharynx is clear and moist and mucous membranes are normal.  Eyes: No scleral icterus.  Cardiovascular: Normal rate and regular rhythm.   Pulmonary/Chest: Effort normal and breath sounds normal. She has no wheezes.  Abdominal: She exhibits no distension.  Lymphadenopathy:    She has no cervical adenopathy.  Skin: No erythema.  No erythema along the left side of the face  Psychiatric: She has a normal mood and affect. Her behavior is normal. Thought content normal.  Good eye contact with examiner    Results for orders placed or performed in visit on 05/10/15  Microscopic Examination  Result Value Ref Range   WBC, UA None seen 0 -  5 /hpf   RBC, UA 3-10 (A) 0 -  2 /hpf   Epithelial Cells (non renal) None seen 0 - 10 /hpf   Renal Epithel, UA None seen None seen /hpf   Bacteria, UA None seen None seen/Few  Urinalysis, Complete  Result Value Ref Range   Specific Gravity, UA 1.015 1.005 - 1.030   pH, UA 6.5 5.0 - 7.5   Color, UA Yellow Yellow   Appearance Ur Clear Clear   Leukocytes, UA Negative Negative   Protein, UA Negative Negative/Trace   Glucose, UA Negative Negative   Ketones, UA Negative Negative   RBC, UA 1+ (A) Negative   Bilirubin, UA Negative Negative   Urobilinogen, Ur 0.2 0.2 - 1.0 mg/dL   Nitrite, UA Negative Negative   Microscopic Examination See below:       Assessment & Plan:   Problem List Items Addressed This Visit      Cardiovascular and Mediastinum   Hypertension    Doing well on current regimen; continue same medicines        Respiratory   Mild chronic obstructive pulmonary disease (HCC)    Doing well at this time, previous smoker; not using rescue inhaler at all; monitor        Digestive   Cyclical vomiting    Likely related to stress; she has been seen by GI; using ondansetron when needed      Dental abscess    Treated with antibiotics, then tooth pulled by dentist; symptoms resolved        Genitourinary   Hematuria, microscopic    Patient was seen by urologist in October; note reviewed        Other   Recurrent depressive disorder, current episode moderate ([place]) - Primary    Continue same dose of medicine; no mania or hypomania noted at all       Relevant Medications   citalopram (CELEXA) 20 MG tablet   Breast cancer screening    Order given for mammogram      Relevant Orders   MM DIGITAL SCREENING BILATERAL   Marital stress    Separated from husband Fall of 2016; supportive listening provided; hopefully, this new  arrangement will lessen her stress level, as she sounds positive about it and she reports feeling safe      Need for Tdap vaccination   Relevant Medications   Tdap (BOOSTRIX) injection 0.5 mL (Completed)      Follow up plan: Return in about 4 months (around 10/23/2015) for or later for fasting labs and visit, 30 minutes.  Meds ordered this encounter  Medications  . **Note De-Identified Fox Obfuscation** omeprazole (PRILOSEC) 20 MG capsule    Sig: Take 20 mg by mouth daily.  . ondansetron (ZOFRAN-ODT) 8 MG disintegrating tablet    Sig: Take 8 mg by mouth every 8 (eight) hours as needed for nausea or vomiting.  . Tdap (BOOSTRIX) injection 0.5 mL    Sig:   . citalopram (CELEXA) 20 MG tablet    Sig: Take 1 tablet (20 mg total) by mouth daily.    Dispense:  90 tablet    Refill:  1

## 2015-07-08 NOTE — Assessment & Plan Note (Signed)
**Note De-Identified Grall Obfuscation** Continue same dose of medicine; no mania or hypomania noted at all

## 2015-07-12 ENCOUNTER — Encounter: Payer: Self-pay | Admitting: Family Medicine

## 2015-07-12 DIAGNOSIS — Z630 Problems in relationship with spouse or partner: Secondary | ICD-10-CM | POA: Insufficient documentation

## 2015-07-12 DIAGNOSIS — Z23 Encounter for immunization: Secondary | ICD-10-CM | POA: Insufficient documentation

## 2015-07-12 HISTORY — DX: Problems in relationship with spouse or partner: Z63.0

## 2015-07-12 NOTE — Assessment & Plan Note (Signed)
**Note De-Identified Perra Obfuscation** Treated with antibiotics, then tooth pulled by dentist; symptoms resolved

## 2015-07-12 NOTE — Assessment & Plan Note (Signed)
**Note De-Identified Mcknight Obfuscation** Doing well at this time, previous smoker; not using rescue inhaler at all; monitor

## 2015-07-12 NOTE — Assessment & Plan Note (Signed)
**Note De-Identified Gawlik Obfuscation** Separated from husband Fall of 2016; supportive listening provided; hopefully, this new arrangement will lessen her stress level, as she sounds positive about it and she reports feeling safe

## 2015-07-12 NOTE — Assessment & Plan Note (Signed)
**Note De-identified Eichler Obfuscation** Order given for mammogram.

## 2015-07-12 NOTE — Assessment & Plan Note (Signed)
**Note De-Identified Boulais Obfuscation** Patient was seen by urologist in October; note reviewed

## 2015-07-12 NOTE — Assessment & Plan Note (Signed)
**Note De-Identified Steinberg Obfuscation** Doing well on current regimen; continue same medicines

## 2015-07-12 NOTE — Assessment & Plan Note (Addendum)
**Note De-Identified Wojahn Obfuscation** Likely related to stress; she has been seen by GI; using ondansetron when needed

## 2015-07-28 IMAGING — CT CT CHEST W/O CM
2 of 3 series · 15 of 36 positions shown, 18 images · non-contrast
Comparison: Chest radiograph 12/30/2013.

CLINICAL DATA: Pneumonia, cough and fever.

EXAM:
CT CHEST WITHOUT CONTRAST
TECHNIQUE: Multidetector CT imaging of the chest was performed following the
standard protocol without IV contrast..

[Series 2: routine chest wo · axial · 0.59mm/px · z∈[-700,-470]mm · 12 of 56 slices shown, 15 images]
[im 5/56  mediastinal]
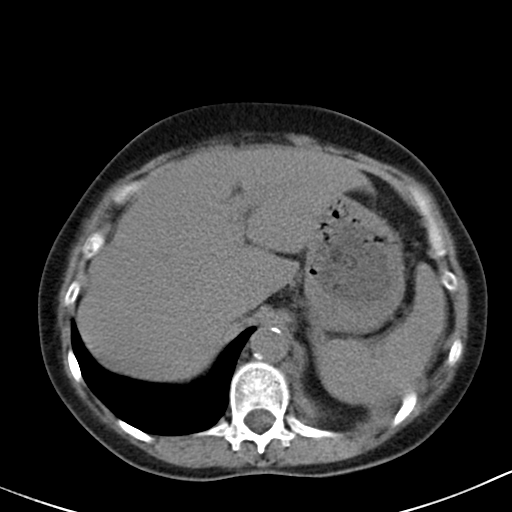
[im 5/56  lung]
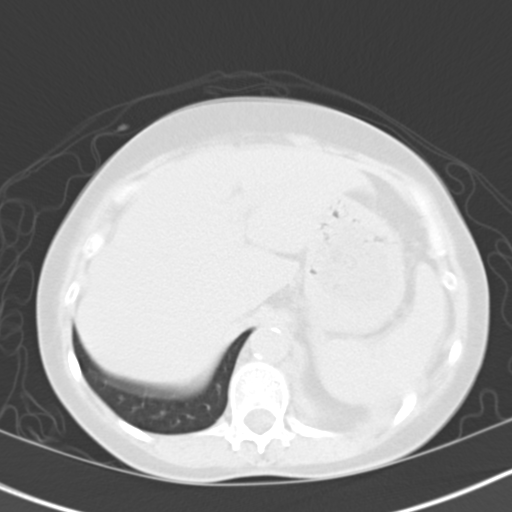
[im 9/56  lung]
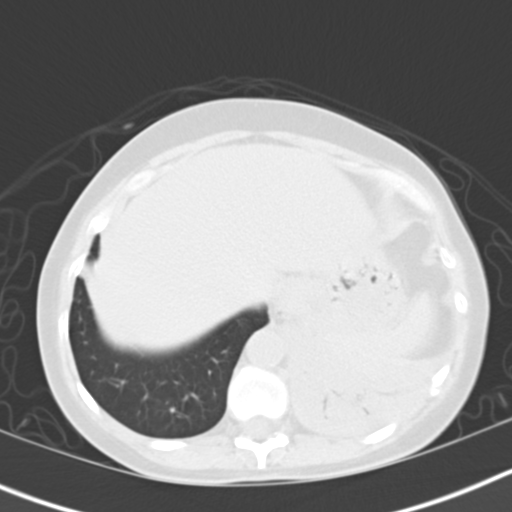
[im 13/56  lung]
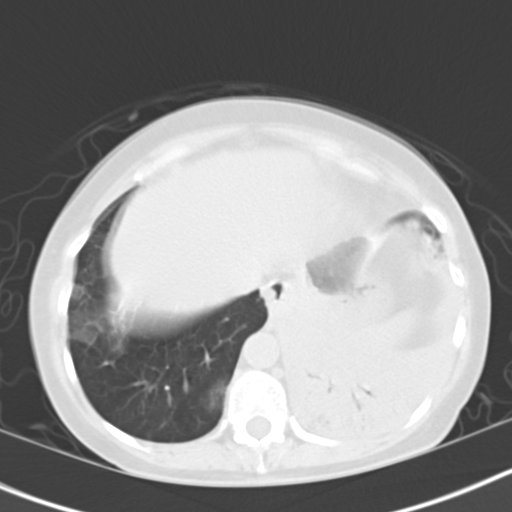
[im 17/56  lung]
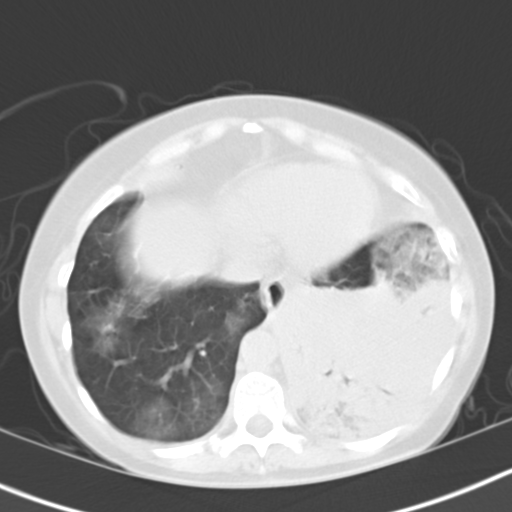
[im 21/56  mediastinal]
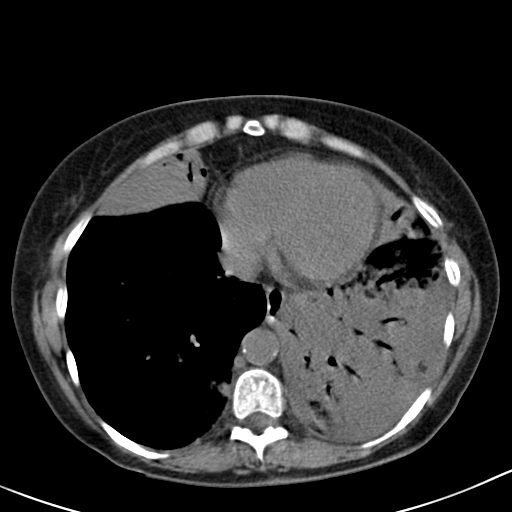
[im 21/56  lung]
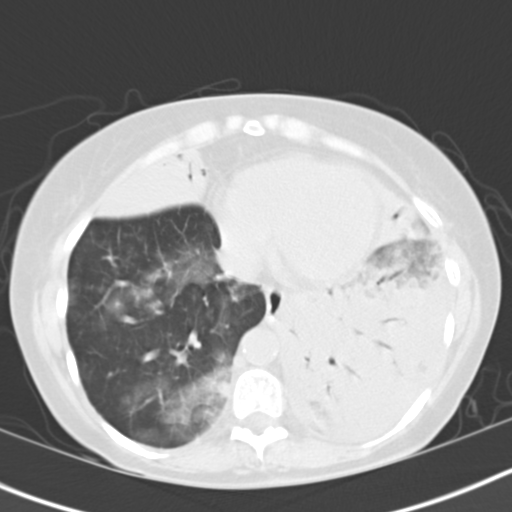
[im 25/56  lung]
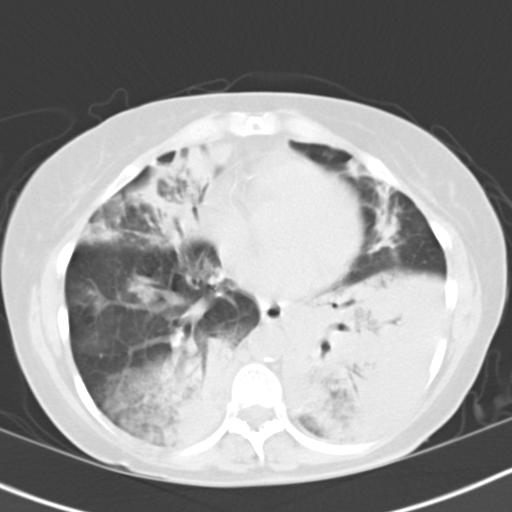
[im 31/56  lung]
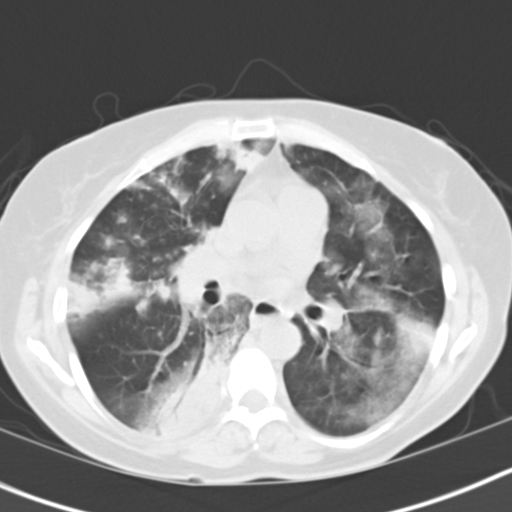
[im 35/56  lung]
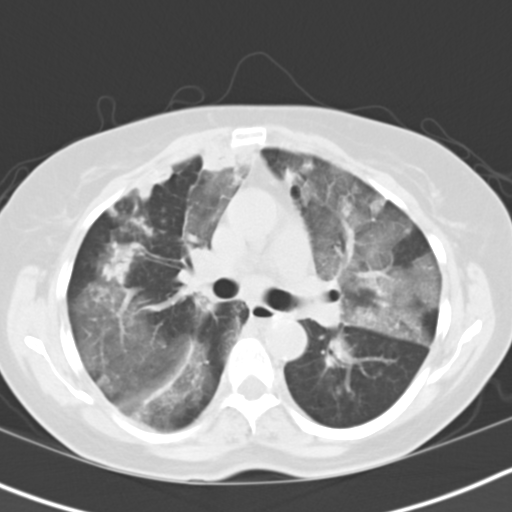
[im 39/56  mediastinal]
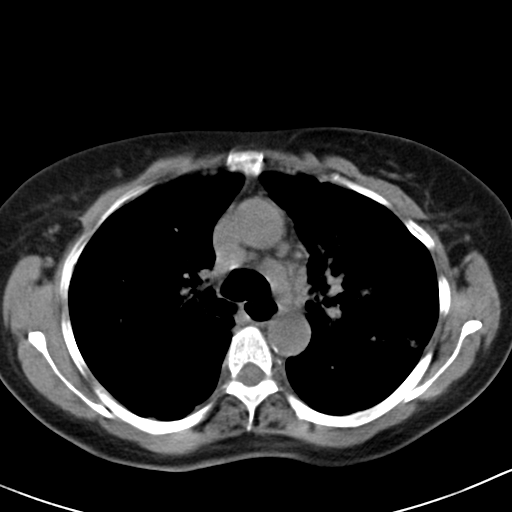
[im 39/56  lung]
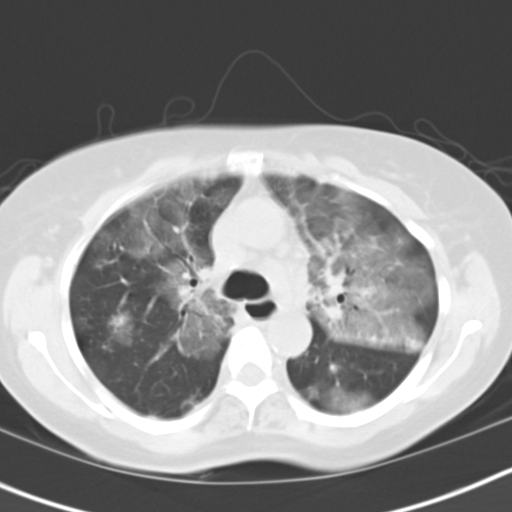
[im 43/56  lung]
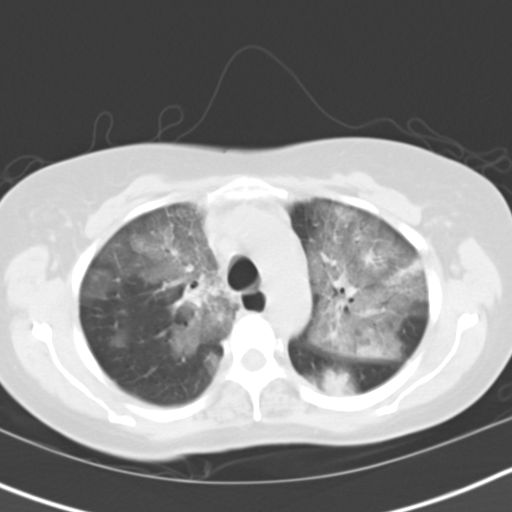
[im 47/56  lung]
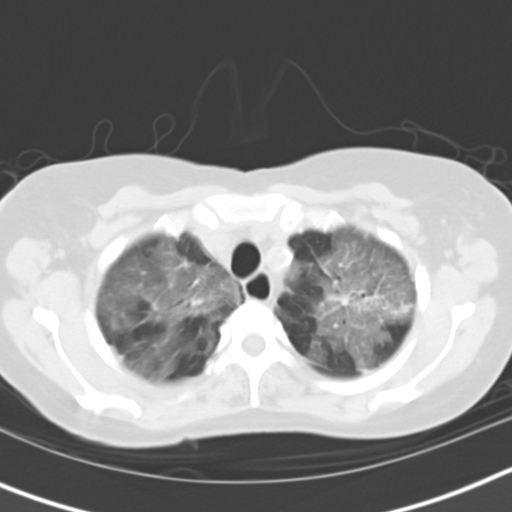
[im 51/56  lung]
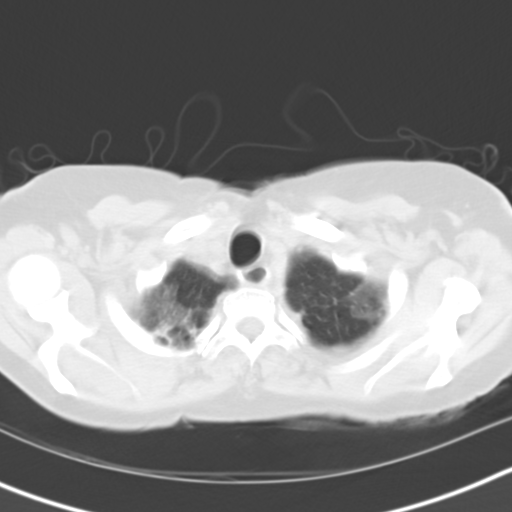

[Series 5: cor routine chest wo · coronal · 0.55mm/px · 3 of 113 slices shown]
[im 23/113  lung]
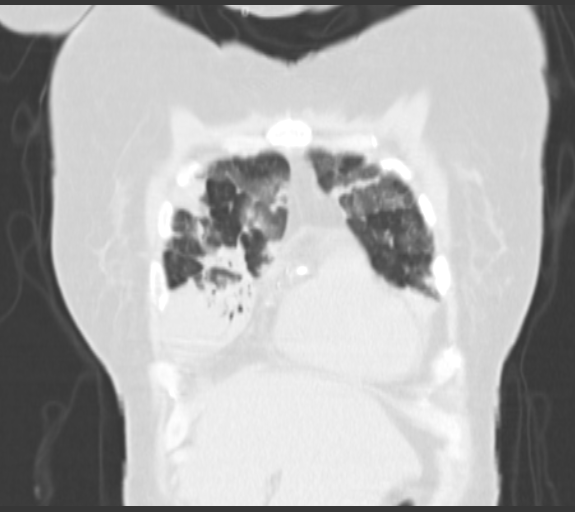
[im 45/113  lung]
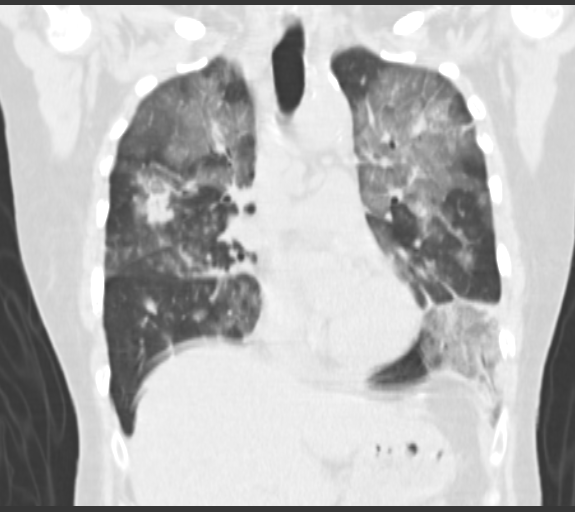
[im 68/113  lung]
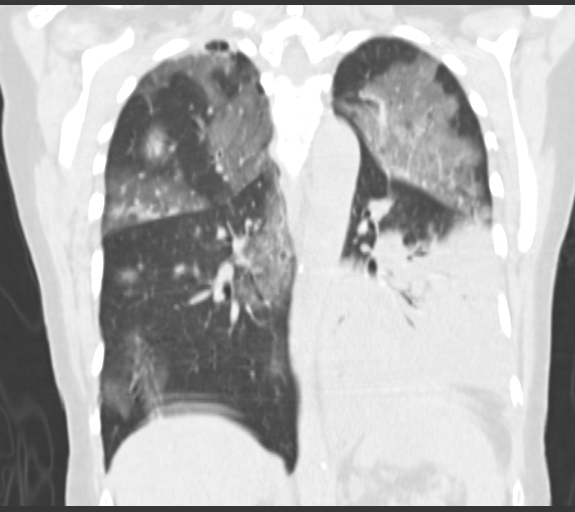

[15 of 36 positions shown; findings below may reference images not displayed]

FINDINGS: Mediastinal lymph nodes measure up to 11 mm in the precarinal
station. Hilar regions are difficult to definitively evaluate
without IV contrast. No axillary adenopathy. Three-vessel coronary
artery calcification. Heart size normal. No pericardial effusion.

There is a combination of patchy bilateral ground-glass and airspace
consolidation involving all lobes of both lungs. Consolidation is
worst in the left lower lobe. No pleural fluid. Airway is
unremarkable.

Incidental imaging of the upper abdomen shows the visualized
portions of the liver, left adrenal gland, left kidney, spleen,
pancreas and stomach to be grossly unremarkable. No worrisome lytic
or sclerotic lesions.
IMPRESSION: 1. Multilobar patchy ground-glass and airspace consolidation, worst
in the left lower lobe, most consistent with multilobar pneumonia.
2. Likely reactive mediastinal lymph nodes.
3. Three-vessel coronary artery calcification.

## 2015-08-23 ENCOUNTER — Other Ambulatory Visit: Payer: Self-pay | Admitting: Family Medicine

## 2015-08-23 ENCOUNTER — Encounter: Payer: Self-pay | Admitting: Family Medicine

## 2015-08-23 ENCOUNTER — Ambulatory Visit (INDEPENDENT_AMBULATORY_CARE_PROVIDER_SITE_OTHER): Payer: BLUE CROSS/BLUE SHIELD | Admitting: Family Medicine

## 2015-08-23 VITALS — BP 151/105 | HR 82 | Temp 97.8°F | Ht 59.0 in | Wt 91.0 lb

## 2015-08-23 DIAGNOSIS — R590 Localized enlarged lymph nodes: Secondary | ICD-10-CM | POA: Diagnosis not present

## 2015-08-23 DIAGNOSIS — I10 Essential (primary) hypertension: Secondary | ICD-10-CM | POA: Diagnosis not present

## 2015-08-23 DIAGNOSIS — J019 Acute sinusitis, unspecified: Secondary | ICD-10-CM | POA: Diagnosis not present

## 2015-08-23 MED ORDER — AMOXICILLIN-POT CLAVULANATE 875-125 MG PO TABS: 1.0000 | ORAL_TABLET | Freq: Two times a day (BID) | ORAL | Status: DC

## 2015-08-23 NOTE — Patient Instructions (Signed)
**Note De-Identified Boylen Obfuscation** Try to use PLAIN allergy medicine without the decongestant Avoid: phenylephrine, phenylpropanolamine, and pseudoephredine Start the antibiotics Please do eat yogurt daily or take a probiotic daily for the next month or two We want to replace the healthy germs in the gut If you notice foul, watery diarrhea in the next two months, schedule an appointment RIGHT AWAY Try vitamin C (orange juice if not diabetic or vitamin C tablets) and drink green tea to help your immune system during your illness Get plenty of rest and hydration Return in 5 days to recheck lymph nodes and blood pressure  Your goal blood pressure is less than 140 mmHg on top. Try to follow the DASH guidelines (DASH stands for Dietary Approaches to Stop Hypertension) Try to limit the sodium in your diet.  Ideally, consume less than 1.5 grams (less than 1,500mg ) per day. Do not add salt when cooking or at the table.  Check the sodium amount on labels when shopping, and choose items lower in sodium when given a choice. Avoid or limit foods that already contain a lot of sodium. Eat a diet rich in fruits and vegetables and whole grains.

## 2015-08-23 NOTE — Progress Notes (Signed)
**Note De-Identified Yett Obfuscation** BP 151/105 mmHg  Pulse 82  Temp(Src) 97.8 F (36.6 C)  Ht 4\' 11"  (1.499 m)  Wt 91 lb (41.277 kg)  BMI 18.37 kg/m2  SpO2 97%   Subjective:    Patient ID: Haley [name], female    DOB: July 19, 1961, 55 y.o.   MRN: [id number]  HPI: Haley [name] is a 55 y.o. female  Chief Complaint  Patient presents with  . Nasal Congestion  . Neck Pain  . Sinusitis    Severe Headache  . Cough  . Ear Pain    Bilateral   Sick for three weeks; worst symptoms is headache and congestion and ears hurting; lots of drainage down the back of the throat; no sore throat; no fever; no rash; no travel; no visits to hospital or nursing home; few people sick but not close contacts;   Relevant past medical, social history reviewed and updated as indicated. Past Medical History  Diagnosis Date  . Hypertension   . Vitamin D deficiency disease   . Cyclical vomiting   . COPD (chronic obstructive pulmonary disease) ([place])   . Thrombocytosis ([place])   . Osteopenia Nov 2015  . GERD (gastroesophageal reflux disease)   . Dysphagia   . CHF (congestive heart failure) ([place])   . Anemia   . Anxiety   . Chronic pain syndrome   . Tachycardia   . Leukocytosis   . History of pneumonia   . History of sepsis   . Cyclic vomiting syndrome     Social History  Substance Use Topics  . Smoking status: Former Smoker -- 1.00 packs/day for 35 years    Types: Cigarettes    Quit date: 08/03/2013  . Smokeless tobacco: Never Used  . Alcohol Use: No     Comment: occasional   Interim medical history since our last visit reviewed. Allergies and medications reviewed and updated.  Review of Systems Per HPI unless specifically indicated above     Objective:    BP 151/105 mmHg  Pulse 82  Temp(Src) 97.8 F (36.6 C)  Ht 4\' 11"  (1.499 m)  Wt 91 lb (41.277 kg)  BMI 18.37 kg/m2  SpO2 97%  Wt Readings from Last 3 Encounters:  08/23/15 91 lb (41.277 kg)  07/08/15 92 lb (41.731 kg)  05/10/15 94 lb 4.8 oz (42.774  kg)    Physical Exam  Constitutional: She appears well-developed.  Underweight, weight down 1 pound over last 6 weeks  HENT:  Right Ear: Tympanic membrane, external ear and ear canal normal.  Left Ear: Tympanic membrane, external ear and ear canal normal.  Nose: Rhinorrhea present.  Mouth/Throat: Oropharynx is clear and moist and mucous membranes are normal.  Cardiovascular: Normal rate and regular rhythm.   Musculoskeletal: She exhibits no edema.  Lymphadenopathy:    She has cervical adenopathy (mild, asymmetric).  Neurological: She is alert.  Psychiatric: She has a normal mood and affect.      Assessment & Plan:   Problem List Items Addressed This Visit      Cardiovascular and Mediastinum   Hypertension    Not at goal; stop using any OTC cold products with decongestants; return for close f/u in five days for recheck; DASH guidelines encouraged        Respiratory   Acute sinusitis - Primary    Start antibiotics; cautioned about risk of C diff, see AVS; close f/u to evaluate condition, lymphadenopathy, BP      Relevant Medications   amoxicillin-clavulanate (AUGMENTIN) 875-125 MG tablet **Note De-Identified Defranco Obfuscation** Other Visit Diagnoses    Cervical lymphadenopathy        recheck in five days after starting antibiotics; patient agrees       Follow up plan: Return in about 5 days (around 08/28/2015) for recheck of lymph nodes and BP with Dr. Sanda Klein.  An after-visit summary was printed and given to the patient at [facility].  Please see the patient instructions which may contain other information and recommendations beyond what is mentioned above in the assessment and plan.  Meds ordered this encounter  Medications  . amoxicillin-clavulanate (AUGMENTIN) 875-125 MG tablet    Sig: Take 1 tablet by mouth 2 (two) times daily.    Dispense:  20 tablet    Refill:  0

## 2015-08-28 ENCOUNTER — Ambulatory Visit: Payer: BLUE CROSS/BLUE SHIELD | Admitting: Family Medicine

## 2015-09-01 DIAGNOSIS — J0100 Acute maxillary sinusitis, unspecified: Secondary | ICD-10-CM | POA: Insufficient documentation

## 2015-09-01 DIAGNOSIS — J019 Acute sinusitis, unspecified: Secondary | ICD-10-CM

## 2015-09-01 HISTORY — DX: Acute sinusitis, unspecified: J01.90

## 2015-09-01 NOTE — Assessment & Plan Note (Signed)
**Note De-Identified Nez Obfuscation** Not at goal; stop using any OTC cold products with decongestants; return for close f/u in five days for recheck; DASH guidelines encouraged

## 2015-09-01 NOTE — Assessment & Plan Note (Signed)
**Note De-Identified [place]atum Obfuscation** Start antibiotics; cautioned about risk of C diff, see AVS; close f/u to evaluate condition, lymphadenopathy, BP

## 2015-09-13 ENCOUNTER — Ambulatory Visit: Payer: BLUE CROSS/BLUE SHIELD | Admitting: Family Medicine

## 2015-11-28 ENCOUNTER — Encounter: Payer: Self-pay | Admitting: Gastroenterology

## 2015-12-03 NOTE — Telephone Encounter (Signed)
**Note De-identified [place]esta Obfuscation** error 

## 2015-12-26 ENCOUNTER — Other Ambulatory Visit: Payer: Self-pay

## 2015-12-31 ENCOUNTER — Other Ambulatory Visit: Payer: Self-pay

## 2015-12-31 ENCOUNTER — Encounter: Payer: Self-pay | Admitting: Gastroenterology

## 2015-12-31 ENCOUNTER — Ambulatory Visit (INDEPENDENT_AMBULATORY_CARE_PROVIDER_SITE_OTHER): Payer: BLUE CROSS/BLUE SHIELD | Admitting: Gastroenterology

## 2015-12-31 VITALS — BP 129/77 | HR 93 | Temp 98.3°F | Ht 59.0 in | Wt 87.6 lb

## 2015-12-31 DIAGNOSIS — K219 Gastro-esophageal reflux disease without esophagitis: Secondary | ICD-10-CM | POA: Diagnosis not present

## 2015-12-31 NOTE — Progress Notes (Signed)
**Note De-Identified [place] Obfuscation** Gastroenterology Consultation  Referring Provider:     Arnetha Courser, MD Primary Care Physician:  Kathrine Haddock, NP Primary Gastroenterologist:  Dr. Allen Norris     Reason for Consultation:     Heartburn        HPI:   Haley [name] is a 55 y.o. y/o female referred for consultation & management of Heartburn  by Dr. Kathrine Haddock, NP.  This patient comes today with a report of chronic heartburn with nausea.  The patient was having a lot of reflux symptoms but states that it has gone away with her omeprazole 20 mg a day.  The patient continues to have nausea despite the heartburn gone away.  The patient also reports that she has had a history of chronic diarrhea.  This is not new and not associated with any weight loss black stools or bloody stools.  The patient also reports that she never had a colonoscopy in the past.  There is no report of any dysphasia.The patient also denies any first-degree relatives with colon cancer or colon polyps although there are some aunts with a history of colon cancer  Past Medical History  Diagnosis Date  . Hypertension   . Vitamin D deficiency disease   . Cyclical vomiting   . COPD (chronic obstructive pulmonary disease) ([place])   . Thrombocytosis ([place])   . Osteopenia Nov 2015  . GERD (gastroesophageal reflux disease)   . Dysphagia   . CHF (congestive heart failure) ([place])   . Anemia   . Anxiety   . Chronic pain syndrome   . Tachycardia   . Leukocytosis   . History of pneumonia   . History of sepsis   . Cyclic vomiting syndrome     Past Surgical History  Procedure Laterality Date  . Cesarean section      x 2    Prior to Admission medications   Medication Sig Start Date End Date Taking? Authorizing Provider  amLODipine (NORVASC) 5 MG tablet TAKE ONE TABLET BY MOUTH ONCE DAILY 08/23/15  Yes Arnetha Courser, MD  busPIRone (BUSPAR) 15 MG tablet Take 1 tablet (15 mg total) by mouth 3 (three) times daily. 05/09/15  Yes Arnetha Courser, MD  citalopram  (CELEXA) 20 MG tablet Take 1 tablet (20 mg total) by mouth daily. 07/08/15  Yes Arnetha Courser, MD  metoprolol succinate (TOPROL-XL) 25 MG 24 hr tablet TAKE ONE TABLET BY MOUTH ONCE DAILY 08/23/15  Yes Arnetha Courser, MD  omeprazole (PRILOSEC) 20 MG capsule Take 20 mg by mouth daily.   Yes Historical Provider, MD  ondansetron (ZOFRAN-ODT) 8 MG disintegrating tablet Take 8 mg by mouth every 8 (eight) hours as needed for nausea or vomiting. Reported on 12/31/2015    Historical Provider, MD    Family History  Problem Relation Age of Onset  . Cancer Father     prostate  . Heart disease Father   . Heart attack Father   . Hypertension Father   . Stroke Neg Hx   . COPD Neg Hx      Social History  Substance Use Topics  . Smoking status: Former Smoker -- 1.00 packs/day for 35 years    Types: Cigarettes    Quit date: 08/03/2013  . Smokeless tobacco: Never Used  . Alcohol Use: No     Comment: occasional    Allergies as of 12/31/2015 - Review Complete 12/31/2015  Allergen Reaction Noted  . Sulfur Diarrhea and Nausea And Vomiting 02/01/2015 **Note De-Identified [place] Obfuscation** Review of Systems:    All systems reviewed and negative except where noted in HPI.   Physical Exam:  BP 129/77 mmHg  Pulse 93  Temp(Src) 98.3 F (36.8 C) (Oral)  Ht 4\' 11"  (1.499 m)  Wt 87 lb 9.6 oz (39.735 kg)  BMI 17.68 kg/m2 No LMP recorded. Patient is postmenopausal. Psych:  Alert and cooperative. Normal mood and affect. General:   Alert,  Well-developed, well-nourished, pleasant and cooperative in NAD Head:  Normocephalic and atraumatic. Eyes:  Sclera clear, no icterus.   Conjunctiva pink. Ears:  Normal auditory acuity. Nose:  No deformity, discharge, or lesions. Mouth:  No deformity or lesions,oropharynx pink & moist. Neck:  Supple; no masses or thyromegaly. Lungs:  Respirations even and unlabored.  Clear throughout to auscultation.   No wheezes, crackles, or rhonchi. No acute distress. Heart:  Regular rate and rhythm; no murmurs,  clicks, rubs, or gallops. Abdomen:  Normal bowel sounds.  No bruits.  Soft, non-tender and non-distended without masses, hepatosplenomegaly or hernias noted.  No guarding or rebound tenderness.  Negative Carnett sign.   Rectal:  Deferred.  Msk:  Symmetrical without gross deformities.  Good, equal movement & strength bilaterally. Pulses:  Normal pulses noted. Extremities:  No clubbing or edema.  No cyanosis. Neurologic:  Alert and oriented x3;  grossly normal neurologically. Skin:  Intact without significant lesions or rashes.  No jaundice. Lymph Nodes:  No significant cervical adenopathy. Psych:  Alert and cooperative. Normal mood and affect.  Imaging Studies: No results found.  Assessment and Plan:   Haley [name] is a 55 y.o. y/o female E has a history of heartburn that is being well controlled with her omeprazole 20 mg.  The patient still has nausea and she has been told to try taking omeprazole 20 mg twice a day to see if the nausea decreases.  She  Was also told that if that did not help her nausea to try 40 mg of omeprazole twice a day to see if that helped her symptoms.  The patient will also be set up for a screening colonoscopy since she has not had a colonoscopy in the past.   Note: This dictation was prepared with Dragon dictation along with smaller phrase technology. Any transcriptional errors that result from this process are unintentional.

## 2016-01-01 ENCOUNTER — Ambulatory Visit: Payer: BLUE CROSS/BLUE SHIELD | Admitting: Unknown Physician Specialty

## 2016-01-03 ENCOUNTER — Ambulatory Visit (INDEPENDENT_AMBULATORY_CARE_PROVIDER_SITE_OTHER): Payer: BLUE CROSS/BLUE SHIELD | Admitting: Family Medicine

## 2016-01-03 ENCOUNTER — Encounter: Payer: Self-pay | Admitting: Family Medicine

## 2016-01-03 VITALS — BP 102/68 | HR 98 | Temp 98.3°F | Resp 16 | Wt 91.5 lb

## 2016-01-03 DIAGNOSIS — M8580 Other specified disorders of bone density and structure, unspecified site: Secondary | ICD-10-CM | POA: Diagnosis not present

## 2016-01-03 DIAGNOSIS — R059 Cough, unspecified: Secondary | ICD-10-CM

## 2016-01-03 DIAGNOSIS — R05 Cough: Secondary | ICD-10-CM | POA: Diagnosis not present

## 2016-01-03 DIAGNOSIS — K219 Gastro-esophageal reflux disease without esophagitis: Secondary | ICD-10-CM

## 2016-01-03 DIAGNOSIS — J0191 Acute recurrent sinusitis, unspecified: Secondary | ICD-10-CM

## 2016-01-03 DIAGNOSIS — S6991XA Unspecified injury of right wrist, hand and finger(s), initial encounter: Secondary | ICD-10-CM | POA: Diagnosis not present

## 2016-01-03 MED ORDER — OMEPRAZOLE 20 MG PO CPDR: 20.0000 mg | DELAYED_RELEASE_CAPSULE | Freq: Every day | ORAL | Status: DC

## 2016-01-03 MED ORDER — AMOXICILLIN-POT CLAVULANATE 875-125 MG PO TABS: 1.0000 | ORAL_TABLET | Freq: Two times a day (BID) | ORAL | Status: DC

## 2016-01-03 NOTE — Progress Notes (Signed)
**Note De-Identified [place] Obfuscation** BP 102/68 mmHg  Pulse 98  Temp(Src) 98.3 F (36.8 C) (Oral)  Resp 16  Wt 91 lb 8 oz (41.504 kg)  SpO2 94%   Subjective:    Patient ID: Haley [name], female    DOB: 1960/09/06, 55 y.o.   MRN: [id number]  HPI: Haley [name] is a 55 y.o. female  Chief Complaint  Patient presents with  . Sinusitis    onset 2 weeks with no change.  Syptoms include congestion, cough, headache and ear fullness  . Hand Pain    right pinky finger bruised and swollen due to catching herself while falling.   Patient is knownt o me from my previous practice Having lots of pressure in her head; pressure in the ears; popping; like head is in a drum No fevers, but does not run a fever No sore throat Coughing; wet junky cough; terrible spells at night; sweats at night Hx of pneumonia 2 years ago; no energy No rash; no travel; no nursing home, hospital or daycare visit She has tried Mucinex congestion stuff, not helps  She also has bruised pinky; hurt it 3 weeks ago and thinks it is broken  Has colonoscopy on Thursday; for screening purposes, no symptoms  Taking omeprazole for heartburn; tried ranitidine without success  Depression screen [facility] 2/9 01/03/2016 07/08/2015  Decreased Interest 0 3  Down, Depressed, Hopeless 0 1  PHQ - 2 Score 0 4  Altered sleeping - 3  Tired, decreased energy - 3  Change in appetite - 3  Feeling bad or failure about yourself  - 0  Trouble concentrating - 0  Moving slowly or fidgety/restless - 0  Suicidal thoughts - 0  PHQ-9 Score - 13  Difficult doing work/chores - Somewhat difficult   Relevant past medical, surgical, family and social history reviewed Past Medical History  Diagnosis Date  . Vitamin D deficiency disease   . Cyclical vomiting   . Thrombocytosis ([place])   . Osteopenia Nov 2015  . Dysphagia   . Anemia   . Anxiety   . Chronic pain syndrome   . Tachycardia   . Leukocytosis   . History of pneumonia   . History of sepsis   . Cyclic  vomiting syndrome   . Hypertension     controlled on meds  . CHF (congestive heart failure) ([place])     while hospitalized with pneumonia  . COPD (chronic obstructive pulmonary disease) ([place])     while hospitalized  . GERD (gastroesophageal reflux disease)     controlled   Past Surgical History  Procedure Laterality Date  . Cesarean section      x 2   Family History  Problem Relation Age of Onset  . Cancer Father     prostate  . Heart disease Father   . Heart attack Father   . Hypertension Father   . Stroke Neg Hx   . COPD Neg Hx    Social History  Substance Use Topics  . Smoking status: Former Smoker -- 1.00 packs/day for 35 years    Types: Cigarettes    Quit date: 08/03/2013  . Smokeless tobacco: Never Used  . Alcohol Use: 1.8 oz/week    3 Glasses of wine per week     Comment: occasional   Interim medical history since last visit reviewed. Allergies and medications reviewed  Review of Systems Per HPI unless specifically indicated above     Objective:    BP 102/68 mmHg  Pulse 98 **Note De-Identified [place] Obfuscation** Temp(Src) 98.3 F (36.8 C) (Oral)  Resp 16  Wt 91 lb 8 oz (41.504 kg)  SpO2 94%  Wt Readings from Last 3 Encounters:  01/03/16 91 lb 8 oz (41.504 kg)  12/31/15 87 lb 9.6 oz (39.735 kg)  01/02/16 87 lb 9.6 oz (39.735 kg)    Physical Exam  Constitutional:  Small-framed, thin female; appears older than stated age; no distress  HENT:  Head: Normocephalic and atraumatic.  Right Ear: No drainage.  Left Ear: No drainage.  Nose: Mucosal edema and rhinorrhea present. Right sinus exhibits maxillary sinus tenderness and frontal sinus tenderness. Left sinus exhibits maxillary sinus tenderness and frontal sinus tenderness.  Mouth/Throat: Oropharynx is clear and moist and mucous membranes are normal.  Eyes: Right eye exhibits no discharge. Left eye exhibits no discharge. No scleral icterus.  Cardiovascular: Normal rate and regular rhythm.   Pulmonary/Chest: Effort normal and breath sounds  normal. No respiratory distress. She has no wheezes. She has no rales.  Musculoskeletal: She exhibits no edema.       Right hand: She exhibits decreased range of motion, tenderness (prox phalanx 5th digit right hand), deformity (swelling of the prox phalanx 5th digit right hand; skin intact; significant bruising) and swelling. She exhibits normal capillary refill. Normal sensation noted.  Lymphadenopathy:    She has no cervical adenopathy.  Neurological: She is alert.  Skin: Skin is warm. No pallor.  Psychiatric: She has a normal mood and affect.    Results for orders placed or performed in visit on 05/10/15  Microscopic Examination  Result Value Ref Range   WBC, UA None seen 0 -  5 /hpf   RBC, UA 3-10 (A) 0 -  2 /hpf   Epithelial Cells (non renal) None seen 0 - 10 /hpf   Renal Epithel, UA None seen None seen /hpf   Bacteria, UA None seen None seen/Few  Urinalysis, Complete  Result Value Ref Range   Specific Gravity, UA 1.015 1.005 - 1.030   pH, UA 6.5 5.0 - 7.5   Color, UA Yellow Yellow   Appearance Ur Clear Clear   Leukocytes, UA Negative Negative   Protein, UA Negative Negative/Trace   Glucose, UA Negative Negative   Ketones, UA Negative Negative   RBC, UA 1+ (A) Negative   Bilirubin, UA Negative Negative   Urobilinogen, Ur 0.2 0.2 - 1.0 mg/dL   Nitrite, UA Negative Negative   Microscopic Examination See below:       Assessment & Plan:   Problem List Items Addressed This Visit      Respiratory   Acute sinusitis    Start augmentin; discussed risk of C diff, precautions to take; rest, hydration, vit D, green tea, etc      Relevant Medications   amoxicillin-clavulanate (AUGMENTIN) 875-125 MG tablet     Digestive   GERD (gastroesophageal reflux disease)    Discussed risk of long-term use of PPIs; avoid triggers if/when possible      Relevant Medications   omeprazole (PRILOSEC) 20 MG capsule     Musculoskeletal and Integument   Osteopenia    Cautions discussed  about risk of PPI use long-term, including decreased absorption of calcium and resultant osteoporosis       Other Visit Diagnoses    Finger injury, right, initial encounter    -  Primary    suspect fracture; order xray; use splint; to ortho if nonhealing    Relevant Orders    DG Finger Little Right    Cough **Note De-Identified [place] Obfuscation** with hx of pneumonia; pulse ox only 94% today; will get CXR; starting augmentin for her sinus infection which may provide some coverage if moving into resp trac    Relevant Orders    DG Chest 2 View       Follow up plan: No Follow-up on file. -- PRN  An after-visit summary was printed and given to the patient at [facility].  Please see the patient instructions which may contain other information and recommendations beyond what is mentioned above in the assessment and plan.  Meds ordered this encounter  Medications  . amoxicillin-clavulanate (AUGMENTIN) 875-125 MG tablet    Sig: Take 1 tablet by mouth 2 (two) times daily.    Dispense:  20 tablet    Refill:  0  . omeprazole (PRILOSEC) 20 MG capsule    Sig: Take 1 capsule (20 mg total) by mouth daily. Caution:prolonged use may increase risk of pneumonia, colitis, osteoporosis, anemia    Dispense:  30 capsule    Refill:  2   Orders Placed This Encounter  Procedures  . DG Chest 2 View  . DG Finger Little Right

## 2016-01-03 NOTE — Patient Instructions (Addendum)
**Note De-Identified [place] Obfuscation** Try a low residue diet for 3 days prior to your colonoscopy prep Please do eat yogurt daily or take a probiotic daily for the next month or two We want to replace the healthy germs in the gut If you notice foul, watery diarrhea in the next two months, schedule an appointment RIGHT AWAY Start the antibiotics Have xrays done across the street Try vitamin C (orange juice if not diabetic or vitamin C tablets) and drink green tea to help your immune system during your illness Get plenty of rest and hydration Splint the pinky finger until you hear from [place]

## 2016-01-04 DIAGNOSIS — K219 Gastro-esophageal reflux disease without esophagitis: Secondary | ICD-10-CM | POA: Insufficient documentation

## 2016-01-04 NOTE — Assessment & Plan Note (Signed)
**Note De-Identified [place] Obfuscation** Discussed risk of long-term use of PPIs; avoid triggers if/when possible

## 2016-01-04 NOTE — Assessment & Plan Note (Signed)
**Note De-Identified [place] Obfuscation** Cautions discussed about risk of PPI use long-term, including decreased absorption of calcium and resultant osteoporosis

## 2016-01-04 NOTE — Assessment & Plan Note (Signed)
**Note De-Identified [place] Obfuscation** Start augmentin; discussed risk of C diff, precautions to take; rest, hydration, vit D, green tea, etc

## 2016-01-07 ENCOUNTER — Other Ambulatory Visit: Payer: Self-pay

## 2016-01-07 MED ORDER — BUSPIRONE HCL 15 MG PO TABS: 15.0000 mg | ORAL_TABLET | Freq: Two times a day (BID) | ORAL | Status: DC

## 2016-01-07 NOTE — Telephone Encounter (Signed)
**Note De-Identified [place] Obfuscation** Got message through call a nurse

## 2016-01-07 NOTE — Discharge Instructions (Signed)
**Note De-identified [place] Obfuscation** General Anesthesia, Adult, Care After °Refer to this sheet in the next few weeks. These instructions provide you with information on caring for yourself after your procedure. Your health care provider may also give you more specific instructions. Your treatment has been planned according to current medical practices, but problems sometimes occur. Call your health care provider if you have any problems or questions after your procedure. °WHAT TO EXPECT AFTER THE PROCEDURE °After the procedure, it is typical to experience: °· Sleepiness. °· Nausea and vomiting. °HOME CARE INSTRUCTIONS °· For the first 24 hours after general anesthesia: °¨ Have a responsible person with you. °¨ Do not drive a car. If you are alone, do not take public transportation. °¨ Do not drink alcohol. °¨ Do not take medicine that has not been prescribed by your health care provider. °¨ Do not sign important papers or make important decisions. °¨ You may resume a normal diet and activities as directed by your health care provider. °· Change bandages (dressings) as directed. °· If you have questions or problems that seem related to general anesthesia, call the hospital and ask for the anesthetist or anesthesiologist on call. °SEEK MEDICAL CARE IF: °· You have nausea and vomiting that continue the day after anesthesia. °· You develop a rash. °SEEK IMMEDIATE MEDICAL CARE IF:  °· You have difficulty breathing. °· You have chest pain. °· You have any allergic problems. °  °This information is not intended to replace advice given to you by your health care provider. Make sure you discuss any questions you have with your health care provider. °  °Document Released: 10/26/2000 Document Revised: 08/10/2014 Document Reviewed: 11/18/2011 °Elsevier Interactive Patient Education ©2016 Elsevier Inc. ° °

## 2016-01-09 ENCOUNTER — Ambulatory Visit: Payer: BLUE CROSS/BLUE SHIELD | Admitting: Anesthesiology

## 2016-01-09 ENCOUNTER — Encounter
Admission: RE | Disposition: A | Discharge status: Home / Self Care | Payer: Self-pay | Source: Ambulatory Visit | Attending: Gastroenterology

## 2016-01-09 ENCOUNTER — Ambulatory Visit
Admission: RE | Admit: 2016-01-09 | Discharge: 2016-01-09 | Disposition: A | Discharge status: Home / Self Care | Payer: BLUE CROSS/BLUE SHIELD | Source: Ambulatory Visit | Attending: Gastroenterology | Admitting: Gastroenterology

## 2016-01-09 DIAGNOSIS — Z87891 Personal history of nicotine dependence: Secondary | ICD-10-CM | POA: Diagnosis not present

## 2016-01-09 DIAGNOSIS — G43A0 Cyclical vomiting, not intractable: Secondary | ICD-10-CM | POA: Diagnosis not present

## 2016-01-09 DIAGNOSIS — K641 Second degree hemorrhoids: Secondary | ICD-10-CM | POA: Diagnosis not present

## 2016-01-09 DIAGNOSIS — M8580 Other specified disorders of bone density and structure, unspecified site: Secondary | ICD-10-CM | POA: Insufficient documentation

## 2016-01-09 DIAGNOSIS — J449 Chronic obstructive pulmonary disease, unspecified: Secondary | ICD-10-CM | POA: Diagnosis not present

## 2016-01-09 DIAGNOSIS — I110 Hypertensive heart disease with heart failure: Secondary | ICD-10-CM | POA: Insufficient documentation

## 2016-01-09 DIAGNOSIS — D125 Benign neoplasm of sigmoid colon: Secondary | ICD-10-CM

## 2016-01-09 DIAGNOSIS — E559 Vitamin D deficiency, unspecified: Secondary | ICD-10-CM | POA: Diagnosis not present

## 2016-01-09 DIAGNOSIS — Z8042 Family history of malignant neoplasm of prostate: Secondary | ICD-10-CM | POA: Insufficient documentation

## 2016-01-09 DIAGNOSIS — I509 Heart failure, unspecified: Secondary | ICD-10-CM | POA: Insufficient documentation

## 2016-01-09 DIAGNOSIS — Z1211 Encounter for screening for malignant neoplasm of colon: Secondary | ICD-10-CM | POA: Diagnosis not present

## 2016-01-09 DIAGNOSIS — R000 Tachycardia, unspecified: Secondary | ICD-10-CM | POA: Diagnosis not present

## 2016-01-09 DIAGNOSIS — K5730 Diverticulosis of large intestine without perforation or abscess without bleeding: Secondary | ICD-10-CM | POA: Diagnosis not present

## 2016-01-09 DIAGNOSIS — R1310 Dysphagia, unspecified: Secondary | ICD-10-CM | POA: Insufficient documentation

## 2016-01-09 DIAGNOSIS — D473 Essential (hemorrhagic) thrombocythemia: Secondary | ICD-10-CM | POA: Insufficient documentation

## 2016-01-09 DIAGNOSIS — G894 Chronic pain syndrome: Secondary | ICD-10-CM | POA: Diagnosis not present

## 2016-01-09 DIAGNOSIS — K219 Gastro-esophageal reflux disease without esophagitis: Secondary | ICD-10-CM | POA: Diagnosis not present

## 2016-01-09 DIAGNOSIS — Z882 Allergy status to sulfonamides status: Secondary | ICD-10-CM | POA: Diagnosis not present

## 2016-01-09 DIAGNOSIS — F419 Anxiety disorder, unspecified: Secondary | ICD-10-CM | POA: Diagnosis not present

## 2016-01-09 DIAGNOSIS — D72829 Elevated white blood cell count, unspecified: Secondary | ICD-10-CM | POA: Insufficient documentation

## 2016-01-09 DIAGNOSIS — Z8249 Family history of ischemic heart disease and other diseases of the circulatory system: Secondary | ICD-10-CM | POA: Insufficient documentation

## 2016-01-09 HISTORY — PX: COLONOSCOPY WITH PROPOFOL: SHX5780

## 2016-01-09 HISTORY — PX: POLYPECTOMY: SHX5525

## 2016-01-09 SURGERY — COLONOSCOPY WITH PROPOFOL
Anesthesia: Monitor Anesthesia Care

## 2016-01-09 MED ORDER — LIDOCAINE HCL (CARDIAC) 20 MG/ML IV SOLN: INTRAVENOUS | Status: DC | PRN

## 2016-01-09 MED ORDER — SODIUM CHLORIDE 0.9 % IV SOLN: INTRAVENOUS | Status: DC

## 2016-01-09 MED ORDER — PROPOFOL 10 MG/ML IV BOLUS
INTRAVENOUS | Status: DC | PRN
  Administered 2016-01-09: 20 mg via INTRAVENOUS
  Administered 2016-01-09: 100 mg via INTRAVENOUS
  Administered 2016-01-09: 20 mg via INTRAVENOUS
  Administered 2016-01-09 (×2): 50 mg via INTRAVENOUS
  Administered 2016-01-09: 30 mg via INTRAVENOUS
  Administered 2016-01-09 (×4): 20 mg via INTRAVENOUS

## 2016-01-09 MED ORDER — LIDOCAINE HCL (CARDIAC) 20 MG/ML IV SOLN
INTRAVENOUS | Status: DC | PRN
  Administered 2016-01-09: 50 mg via INTRAVENOUS

## 2016-01-09 MED ORDER — LACTATED RINGERS IV SOLN
INTRAVENOUS | Status: DC
  Administered 2016-01-09: 08:00:00 via INTRAVENOUS

## 2016-01-09 MED ORDER — STERILE WATER FOR IRRIGATION IR SOLN
Status: DC | PRN
  Administered 2016-01-09: 09:00:00

## 2016-01-09 SURGICAL SUPPLY — 22 items
CANISTER SUCT 1200ML W/VALVE (MISCELLANEOUS) ×4 IMPLANT
CLIP HMST 235XBRD CATH ROT (MISCELLANEOUS) IMPLANT
CLIP RESOLUTION 360 11X235 (MISCELLANEOUS)
FCP ESCP3.2XJMB 240X2.8X (MISCELLANEOUS)
FORCEPS BIOP RAD 4 LRG CAP 4 (CUTTING FORCEPS) IMPLANT
FORCEPS BIOP RJ4 240 W/NDL (MISCELLANEOUS)
FORCEPS ESCP3.2XJMB 240X2.8X (MISCELLANEOUS) IMPLANT
GOWN CVR UNV OPN BCK APRN NK (MISCELLANEOUS) ×4 IMPLANT
GOWN ISOL THUMB LOOP REG UNIV (MISCELLANEOUS) ×4
INJECTOR VARIJECT VIN23 (MISCELLANEOUS) IMPLANT
KIT DEFENDO VALVE AND CONN (KITS) IMPLANT
KIT ENDO PROCEDURE OLY (KITS) ×4 IMPLANT
MARKER SPOT ENDO TATTOO 5ML (MISCELLANEOUS) IMPLANT
PAD GROUND ADULT SPLIT (MISCELLANEOUS) IMPLANT
PROBE APC STR FIRE (PROBE) IMPLANT
SNARE SHORT THROW 13M SML OVAL (MISCELLANEOUS) ×4 IMPLANT
SNARE SHORT THROW 30M LRG OVAL (MISCELLANEOUS) IMPLANT
SNARE SNG USE RND 15MM (INSTRUMENTS) IMPLANT
SPOT EX ENDOSCOPIC TATTOO (MISCELLANEOUS)
TRAP ETRAP POLY (MISCELLANEOUS) ×4 IMPLANT
VARIJECT INJECTOR VIN23 (MISCELLANEOUS)
WATER STERILE IRR 250ML POUR (IV SOLUTION) ×4 IMPLANT

## 2016-01-09 NOTE — Op Note (Signed)
**Note De-Identified [place] Obfuscation** [facility] Gastroenterology Patient Name: Haley [name] Procedure Date: 01/09/2016 8:31 AM MRN: [id number] Account #: [id number] Date of Birth: 1961/04/25 Admit Type: Outpatient Age: 55 Room: [facility] OR ROOM 01 Gender: Female Note Status: Finalized Procedure:            Colonoscopy Indications:          Screening for colorectal malignant neoplasm Providers:            Lucilla Lame, MD Referring MD:         Arnetha Courser (Referring MD) Medicines:            Propofol per Anesthesia Complications:        No immediate complications. Procedure:            Pre-Anesthesia Assessment:                       - Prior to the procedure, a History and Physical was                        performed, and patient medications and allergies were                        reviewed. The patient's tolerance of previous                        anesthesia was also reviewed. The risks and benefits of                        the procedure and the sedation options and risks were                        discussed with the patient. All questions were                        answered, and informed consent was obtained. Prior                        Anticoagulants: The patient has taken no previous                        anticoagulant or antiplatelet agents. ASA Grade                        Assessment: II - A patient with mild systemic disease.                        After reviewing the risks and benefits, the patient was                        deemed in satisfactory condition to undergo the                        procedure.                       After obtaining informed consent, the colonoscope was                        passed under direct vision. Throughout the procedure, **Note De-Identified [place] Obfuscation** the patient's blood pressure, pulse, and oxygen                        saturations were monitored continuously. The Olympus                        CF-HQ190L Colonoscope (S#. [phone]) was introduced          through the anus and advanced to the the cecum,                        identified by appendiceal orifice and ileocecal valve.                        The colonoscopy was performed without difficulty. The                        patient tolerated the procedure well. The quality of                        the bowel preparation was good. Findings:      The perianal and digital rectal examinations were normal.      Two sessile polyps were found in the sigmoid colon. The polyps were 4 to       5 mm in size. These polyps were removed with a cold snare. Resection and       retrieval were complete.      Multiple small-mouthed diverticula were found in the sigmoid colon.      Non-bleeding internal hemorrhoids were found during retroflexion. The       hemorrhoids were Grade II (internal hemorrhoids that prolapse but reduce       spontaneously). Impression:           - Two 4 to 5 mm polyps in the sigmoid colon, removed                        with a cold snare. Resected and retrieved.                       - Diverticulosis in the sigmoid colon.                       - Non-bleeding internal hemorrhoids. Recommendation:       - Await pathology results.                       - Repeat colonoscopy in 5 years if polyp adenoma and 10                        years if hyperplastic Procedure Code(s):    --- Professional ---                       [phone], Colonoscopy, flexible; with removal of tumor(s),                        polyp(s), or other lesion(s) by snare technique Diagnosis Code(s):    --- Professional ---                       Z12.11, Encounter for screening for malignant neoplasm **Note De-Identified [place] Obfuscation** of colon                       D12.5, Benign neoplasm of sigmoid colon CPT copyright 2016 American Medical Association. All rights reserved. The codes documented in this report are preliminary and upon coder review may  be revised to meet current compliance requirements. Lucilla Lame, MD 01/09/2016  9:00:52 AM This report has been signed electronically. Number of Addenda: 0 Note Initiated On: 01/09/2016 8:31 AM Scope Withdrawal Time: 0 hours 8 minutes 38 seconds  Total Procedure Duration: 0 hours 13 minutes 38 seconds       [facility]

## 2016-01-09 NOTE — Transfer of Care (Signed)
**Note De-Identified [place] Obfuscation** Immediate Anesthesia Transfer of Care Note  Patient: Haley [name]  Procedure(s) Performed: Procedure(s): COLONOSCOPY WITH PROPOFOL (N/A) POLYPECTOMY  Patient Location: PACU  Anesthesia Type: MAC  Level of Consciousness: awake, alert  and patient cooperative  Airway and Oxygen Therapy: Patient Spontanous Breathing and Patient connected to supplemental oxygen  Post-op Assessment: Post-op Vital signs reviewed, Patient's Cardiovascular Status Stable, Respiratory Function Stable, Patent Airway and No signs of Nausea or vomiting  Post-op Vital Signs: Reviewed and stable  Complications: No apparent anesthesia complications

## 2016-01-09 NOTE — Anesthesia Procedure Notes (Signed)
**Note De-Identified [place] Obfuscation** Procedure Name: MAC Date/Time: 01/09/2016 8:38 AM Performed by: Cameron Ali Pre-anesthesia Checklist: Patient identified, Emergency Drugs available, Suction available, Timeout performed and Patient being monitored Patient Re-evaluated:Patient Re-evaluated prior to inductionOxygen Delivery Method: Nasal cannula Placement Confirmation: positive ETCO2

## 2016-01-09 NOTE — Anesthesia Preprocedure Evaluation (Signed)
**Note De-Identified [place] Obfuscation** Anesthesia Evaluation  Patient identified by MRN, date of birth, ID band Patient awake    Airway Mallampati: I  TM Distance: >3 FB Neck ROM: Full    Dental   Pulmonary former smoker,    Pulmonary exam normal        Cardiovascular hypertension, Pt. on medications and Pt. on home beta blockers Normal cardiovascular exam     Neuro/Psych    GI/Hepatic GERD  Medicated and Controlled,  Endo/Other    Renal/GU      Musculoskeletal   Abdominal   Peds  Hematology   Anesthesia Other Findings   Reproductive/Obstetrics                             Anesthesia Physical Anesthesia Plan  ASA: II  Anesthesia Plan: MAC   Post-op Pain Management:    Induction: Intravenous  Airway Management Planned:   Additional Equipment:   Intra-op Plan:   Post-operative Plan:   Informed Consent: I have reviewed the patients History and Physical, chart, labs and discussed the procedure including the risks, benefits and alternatives for the proposed anesthesia with the patient or authorized representative who has indicated his/her understanding and acceptance.     Plan Discussed with: CRNA  Anesthesia Plan Comments:         Anesthesia Quick Evaluation

## 2016-01-09 NOTE — H&P (Signed)
**Note De-Identified Yamaguchi Obfuscation** [facility]  [address], [place], [place] [address] Phone: [phone] Fax : [phone]  Primary Care Physician:  Enid Derry, MD Primary Gastroenterologist:  Dr. Allen Norris  Pre-Procedure History & Physical: HPI:  Haley [name] is a 55 y.o. female is here for a screening colonoscopy.   Past Medical History  Diagnosis Date  . Vitamin D deficiency disease   . Cyclical vomiting   . Thrombocytosis ([place])   . Osteopenia Nov 2015  . Dysphagia   . Anemia   . Anxiety   . Chronic pain syndrome   . Tachycardia   . Leukocytosis   . History of pneumonia   . History of sepsis   . Cyclic vomiting syndrome   . Hypertension     controlled on meds  . CHF (congestive heart failure) ([place])     while hospitalized with pneumonia  . COPD (chronic obstructive pulmonary disease) ([place])     while hospitalized  . GERD (gastroesophageal reflux disease)     controlled    Past Surgical History  Procedure Laterality Date  . Cesarean section      x 2    Prior to Admission medications   Medication Sig Start Date End Date Taking? Authorizing Provider  amLODipine (NORVASC) 5 MG tablet TAKE ONE TABLET BY MOUTH ONCE DAILY Patient taking differently: TAKE ONE TABLET BY MOUTH ONCE DAILY/afternoon 08/23/15  Yes Arnetha Courser, MD  busPIRone (BUSPAR) 15 MG tablet Take 1 tablet (15 mg total) by mouth 2 (two) times daily. 01/07/16  Yes Arnetha Courser, MD  citalopram (CELEXA) 20 MG tablet Take 1 tablet (20 mg total) by mouth daily. Patient taking differently: Take 20 mg by mouth daily. afternoon 07/08/15  Yes Arnetha Courser, MD  metoprolol succinate (TOPROL-XL) 25 MG 24 hr tablet TAKE ONE TABLET BY MOUTH ONCE DAILY Patient taking differently: TAKE ONE TABLET BY MOUTH ONCE DAILY/afternoon 08/23/15  Yes Arnetha Courser, MD  omeprazole (PRILOSEC) 20 MG capsule Take 1 capsule (20 mg total) by mouth daily. Caution:prolonged use may increase risk of pneumonia, colitis, osteoporosis, anemia 01/03/16   Yes Arnetha Courser, MD  amoxicillin-clavulanate (AUGMENTIN) 875-125 MG tablet Take 1 tablet by mouth 2 (two) times daily. 01/03/16   Arnetha Courser, MD  ondansetron (ZOFRAN-ODT) 8 MG disintegrating tablet Take 8 mg by mouth every 8 (eight) hours as needed for nausea or vomiting. Reported on 01/09/2016    Historical Provider, MD    Allergies as of 12/31/2015 - Review Complete 12/31/2015  Allergen Reaction Noted  . Sulfur Diarrhea and Nausea And Vomiting 02/01/2015    Family History  Problem Relation Age of Onset  . Cancer Father     prostate  . Heart disease Father   . Heart attack Father   . Hypertension Father   . Stroke Neg Hx   . COPD Neg Hx     Social History   Social History  . Marital Status: Single    Spouse Name: N/A  . Number of Children: N/A  . Years of Education: N/A   Occupational History  . Not on file.   Social History Main Topics  . Smoking status: Former Smoker -- 1.00 packs/day for 35 years    Types: Cigarettes    Quit date: 08/03/2013  . Smokeless tobacco: Never Used  . Alcohol Use: 1.8 oz/week    3 Glasses of wine per week     Comment: occasional  . Drug Use: No  . Sexual Activity: Not **Note De-Identified Rosendahl Obfuscation** Currently   Other Topics Concern  . Not on file   Social History Narrative   Separated from husband Fall 2016    Review of Systems: See HPI, otherwise negative ROS  Physical Exam: BP 121/89 mmHg  Pulse 71  Temp(Src) 98.2 F (36.8 C) (Temporal)  Resp 16  Ht 5' (1.524 m)  Wt 90 lb (40.824 kg)  BMI 17.58 kg/m2  SpO2 99% General:   Alert,  pleasant and cooperative in NAD Head:  Normocephalic and atraumatic. Neck:  Supple; no masses or thyromegaly. Lungs:  Clear throughout to auscultation.    Heart:  Regular rate and rhythm. Abdomen:  Soft, nontender and nondistended. Normal bowel sounds, without guarding, and without rebound.   Neurologic:  Alert and  oriented x4;  grossly normal neurologically.  Impression/Plan: Haley [name] is now here to undergo a  screening colonoscopy.  Risks, benefits, and alternatives regarding colonoscopy have been reviewed with the patient.  Questions have been answered.  All parties agreeable.

## 2016-01-09 NOTE — Anesthesia Postprocedure Evaluation (Signed)
**Note De-Identified Eugene Obfuscation** Anesthesia Post Note  Patient: Haley [name]  Procedure(s) Performed: Procedure(s) (LRB): COLONOSCOPY WITH PROPOFOL (N/A) POLYPECTOMY  Patient location during evaluation: PACU Anesthesia Type: General Level of consciousness: awake and alert Pain management: pain level controlled Vital Signs Assessment: post-procedure vital signs reviewed and stable Respiratory status: spontaneous breathing, nonlabored ventilation, respiratory function stable and patient connected to nasal cannula oxygen Cardiovascular status: blood pressure returned to baseline and stable Postop Assessment: no signs of nausea or vomiting Anesthetic complications: no    Marshell Levan

## 2016-01-10 ENCOUNTER — Encounter: Payer: Self-pay | Admitting: Gastroenterology

## 2016-01-14 ENCOUNTER — Encounter: Payer: Self-pay | Admitting: Gastroenterology

## 2016-02-10 ENCOUNTER — Encounter: Payer: Self-pay | Admitting: Emergency Medicine

## 2016-02-10 ENCOUNTER — Emergency Department
Admission: EM | Admit: 2016-02-10 | Discharge: 2016-02-10 | Disposition: A | Discharge status: Home / Self Care | Payer: BLUE CROSS/BLUE SHIELD | Attending: Emergency Medicine | Admitting: Emergency Medicine

## 2016-02-10 ENCOUNTER — Emergency Department: Payer: BLUE CROSS/BLUE SHIELD

## 2016-02-10 DIAGNOSIS — Y929 Unspecified place or not applicable: Secondary | ICD-10-CM | POA: Diagnosis not present

## 2016-02-10 DIAGNOSIS — I509 Heart failure, unspecified: Secondary | ICD-10-CM | POA: Diagnosis not present

## 2016-02-10 DIAGNOSIS — S52122A Displaced fracture of head of left radius, initial encounter for closed fracture: Secondary | ICD-10-CM | POA: Diagnosis not present

## 2016-02-10 DIAGNOSIS — S20212A Contusion of left front wall of thorax, initial encounter: Secondary | ICD-10-CM

## 2016-02-10 DIAGNOSIS — Y93E2 Activity, laundry: Secondary | ICD-10-CM | POA: Diagnosis not present

## 2016-02-10 DIAGNOSIS — Y999 Unspecified external cause status: Secondary | ICD-10-CM | POA: Diagnosis not present

## 2016-02-10 DIAGNOSIS — W1839XA Other fall on same level, initial encounter: Secondary | ICD-10-CM | POA: Insufficient documentation

## 2016-02-10 DIAGNOSIS — J449 Chronic obstructive pulmonary disease, unspecified: Secondary | ICD-10-CM | POA: Insufficient documentation

## 2016-02-10 DIAGNOSIS — Z87891 Personal history of nicotine dependence: Secondary | ICD-10-CM | POA: Diagnosis not present

## 2016-02-10 DIAGNOSIS — Z79899 Other long term (current) drug therapy: Secondary | ICD-10-CM | POA: Diagnosis not present

## 2016-02-10 DIAGNOSIS — S2020XA Contusion of thorax, unspecified, initial encounter: Secondary | ICD-10-CM | POA: Diagnosis not present

## 2016-02-10 DIAGNOSIS — I110 Hypertensive heart disease with heart failure: Secondary | ICD-10-CM | POA: Insufficient documentation

## 2016-02-10 DIAGNOSIS — S299XXA Unspecified injury of thorax, initial encounter: Secondary | ICD-10-CM | POA: Diagnosis present

## 2016-02-10 MED ORDER — TRAMADOL HCL 50 MG PO TABS
ORAL_TABLET | ORAL | Status: AC
  Administered 2016-02-10: 50 mg via ORAL
  Filled 2016-02-10: qty 1

## 2016-02-10 MED ORDER — TRAMADOL HCL 50 MG PO TABS
50.0000 mg | ORAL_TABLET | Freq: Once | ORAL | Status: AC
  Administered 2016-02-10: 50 mg via ORAL

## 2016-02-10 MED ORDER — HYDROCODONE-ACETAMINOPHEN 5-325 MG PO TABS: 1.0000 | ORAL_TABLET | Freq: Four times a day (QID) | ORAL | Status: DC | PRN

## 2016-02-10 NOTE — ED Notes (Signed)
**Note De-Identified Lembcke Obfuscation** See triage note  [address] on sat  Hit tub  Having rib pain

## 2016-02-10 NOTE — ED Notes (Signed)
**Note De-Identified Ballentine Obfuscation** Pt reports that she was doing laundry on Saturday night and got "caught up" in the laundry, falling to the ground. She turned as she fell and her ribcage landed on the side of the bathtub. States she has been in pain ever since. Pt alert & oriented with NAD noted. Pt denies LOC or hitting her head.

## 2016-02-10 NOTE — ED Provider Notes (Signed)
**Note De-Identified Rajewski Obfuscation** [facility] Provider Note ____________________________________________  Time seen: Approximately 2:22 PM  I have reviewed the triage vital signs and the nursing notes.   HISTORY  Chief Complaint Fall and Rib Injury    HPI Haley [name] is a 55 y.o. female who presents to the emergency department for evaluation of left rib pain and left elbow pain. She states she fell on Saturday while doing laundry and hit the tub, which has caused pain in her left elbow. She states that she also states that she fell while gardening and landed on her left rib area. She reports frequent falls because she is "clumsy." She has been using a pillowcase as a sling for her arm, but the pain is not improving.  Past Medical History  Diagnosis Date  . Vitamin D deficiency disease   . Cyclical vomiting   . Thrombocytosis ([place])   . Osteopenia Nov 2015  . Dysphagia   . Anemia   . Anxiety   . Chronic pain syndrome   . Tachycardia   . Leukocytosis   . History of pneumonia   . History of sepsis   . Cyclic vomiting syndrome   . Hypertension     controlled on meds  . CHF (congestive heart failure) ([place])     while hospitalized with pneumonia  . COPD (chronic obstructive pulmonary disease) ([place])     while hospitalized  . GERD (gastroesophageal reflux disease)     controlled    Patient Active Problem List   Diagnosis Date Noted  . Special screening for malignant neoplasms, colon   . Benign neoplasm of sigmoid colon   . GERD (gastroesophageal reflux disease) 01/04/2016  . Acute sinusitis 09/01/2015  . Marital stress 07/12/2015  . Need for Tdap vaccination 07/12/2015  . Breast cancer screening 07/08/2015  . Dental abscess 05/09/2015  . Pain due to dental caries 05/09/2015  . Abdominal pain 04/29/2015  . Hematuria, microscopic 04/26/2015  . Recurrent depressive disorder, current episode moderate ([place]) 04/24/2015  . Hypokalemia 04/24/2015  . Hypertension    . Vitamin D deficiency disease   . Cyclical vomiting   . COPD (chronic obstructive pulmonary disease) ([place])   . Thrombocytosis ([place])   . Osteopenia 06/03/2014  . Mild chronic obstructive pulmonary disease ([place]) 04/16/2014    Past Surgical History  Procedure Laterality Date  . Cesarean section      x 2  . Colonoscopy with propofol N/A 01/09/2016    Procedure: COLONOSCOPY WITH PROPOFOL;  Surgeon: Lucilla Lame, MD;  Location: [facility];  Service: Endoscopy;  Laterality: N/A;  . Polypectomy  01/09/2016    Procedure: POLYPECTOMY;  Surgeon: Lucilla Lame, MD;  Location: [facility];  Service: Endoscopy;;    Current Outpatient Rx  Name  Route  Sig  Dispense  Refill  . amLODipine (NORVASC) 5 MG tablet      TAKE ONE TABLET BY MOUTH ONCE DAILY Patient taking differently: TAKE ONE TABLET BY MOUTH ONCE DAILY/afternoon   90 tablet   1   . amoxicillin-clavulanate (AUGMENTIN) 875-125 MG tablet   Oral   Take 1 tablet by mouth 2 (two) times daily.   20 tablet   0   . busPIRone (BUSPAR) 15 MG tablet   Oral   Take 1 tablet (15 mg total) by mouth 2 (two) times daily.   60 tablet   5   . citalopram (CELEXA) 20 MG tablet   Oral   Take 1 tablet (20 mg total) by **Note De-Identified Hannula Obfuscation** mouth daily. Patient taking differently: Take 20 mg by mouth daily. afternoon   90 tablet   1   . HYDROcodone-acetaminophen (NORCO/VICODIN) 5-325 MG tablet   Oral   Take 1 tablet by mouth every 6 (six) hours as needed.   16 tablet   0   . metoprolol succinate (TOPROL-XL) 25 MG 24 hr tablet      TAKE ONE TABLET BY MOUTH ONCE DAILY Patient taking differently: TAKE ONE TABLET BY MOUTH ONCE DAILY/afternoon   90 tablet   1   . omeprazole (PRILOSEC) 20 MG capsule   Oral   Take 1 capsule (20 mg total) by mouth daily. Caution:prolonged use may increase risk of pneumonia, colitis, osteoporosis, anemia   30 capsule   2   . ondansetron (ZOFRAN-ODT) 8 MG disintegrating tablet   Oral   Take 8 mg by mouth every 8  (eight) hours as needed for nausea or vomiting. Reported on 01/09/2016           Allergies Sulfur  Family History  Problem Relation Age of Onset  . Cancer Father     prostate  . Heart disease Father   . Heart attack Father   . Hypertension Father   . Stroke Neg Hx   . COPD Neg Hx     Social History Social History  Substance Use Topics  . Smoking status: Former Smoker -- 1.00 packs/day for 35 years    Types: Cigarettes    Quit date: 08/03/2013  . Smokeless tobacco: Never Used  . Alcohol Use: 1.8 oz/week    3 Glasses of wine per week     Comment: occasional    Review of Systems Constitutional: No recent illness. Cardiovascular: Denies chest pain or palpitations. Respiratory: Denies shortness of breath. Musculoskeletal: Pain in left elbow and left rib cage. Skin: Negative for rash, wound, lesion. Neurological: Negative for focal weakness or numbness.  ____________________________________________   PHYSICAL EXAM:  VITAL SIGNS: ED Triage Vitals  Enc Vitals Group     BP 02/10/16 1349 153/99 mmHg     Pulse Rate 02/10/16 1349 88     Resp 02/10/16 1349 16     Temp 02/10/16 1349 98.7 F (37.1 C)     Temp Source 02/10/16 1349 Oral     SpO2 02/10/16 1349 97 %     Weight 02/10/16 1349 98 lb (44.453 kg)     Height 02/10/16 1349 5' (1.524 m)     Head Cir --      Peak Flow --      Pain Score 02/10/16 1350 8     Pain Loc --      Pain Edu? --      Excl. in [place]? --     Constitutional: Alert and oriented. Well appearing and in no acute distress. Eyes: Conjunctivae are normal. EOMI. Head: Atraumatic. Neck: No stridor.  Respiratory: Normal respiratory effort. Breath sounds diminished throughout without adventitious sounds. Musculoskeletal: Tenderness over the lateral thorax without contusion or open wound. Tenderness over the left radial head without deformity or swelling in the area. Neurologic:  Normal speech and language. No gross focal neurologic deficits are  appreciated. Speech is normal. No gait instability. Skin:  Skin is warm, dry and intact. Atraumatic. Psychiatric: Mood and affect are normal. Speech and behavior are normal.  ____________________________________________   LABS (all labs ordered are listed, but only abnormal results are displayed)  Labs Reviewed - No data to display ____________________________________________  RADIOLOGY  Chest x-ray negative for acute **Note De-Identified Bahri Obfuscation** abnormality. Left radial head fracture. I, Sherrie George, personally viewed and evaluated these images (plain radiographs) as part of my medical decision making, as well as reviewing the written report by the radiologist.   ____________________________________________   PROCEDURES  Procedure(s) performed:  Sling applied to the left arm by RN.   ____________________________________________   INITIAL IMPRESSION / [place] / ED COURSE  Pertinent labs & imaging results that were available during my care of the patient were reviewed by me and considered in my medical decision making (see chart for details).  Patient is to follow up with orthopedics. She is to use theincentive spirometer she has at home every 2 hours while awake. She was advised that the pain medication may cause dizziness and she should only take if someone will be with her to assist her out of bed and to ambulate to avoid falling. ____________________________________________   FINAL CLINICAL IMPRESSION(S) / ED DIAGNOSES  Final diagnoses:  Radial head fracture, left, closed, initial encounter  Rib contusion, left, initial encounter       Victorino Dike, FNP 02/10/16 [place] Yao, MD 02/10/16 1538

## 2016-02-17 ENCOUNTER — Telehealth: Payer: Self-pay

## 2016-02-17 NOTE — Telephone Encounter (Signed)
**Note De-Identified Yeary Obfuscation** Pt called states she fell and went to the ER still having a lot of pain in ribs, she states normal xray.  Your next avaible is aug 1st I told her if she does not feel she can wait that long go back to er or urgent care to get rechecked.

## 2016-02-21 ENCOUNTER — Ambulatory Visit: Payer: BLUE CROSS/BLUE SHIELD | Admitting: Family Medicine

## 2016-05-12 ENCOUNTER — Encounter: Payer: Self-pay | Admitting: Unknown Physician Specialty

## 2016-05-12 ENCOUNTER — Ambulatory Visit (INDEPENDENT_AMBULATORY_CARE_PROVIDER_SITE_OTHER): Payer: BLUE CROSS/BLUE SHIELD | Admitting: Unknown Physician Specialty

## 2016-05-12 VITALS — BP 124/89 | HR 121 | Temp 99.8°F | Ht 59.8 in | Wt 87.6 lb

## 2016-05-12 DIAGNOSIS — F322 Major depressive disorder, single episode, severe without psychotic features: Secondary | ICD-10-CM

## 2016-05-12 DIAGNOSIS — J449 Chronic obstructive pulmonary disease, unspecified: Secondary | ICD-10-CM

## 2016-05-12 DIAGNOSIS — I10 Essential (primary) hypertension: Secondary | ICD-10-CM

## 2016-05-12 HISTORY — DX: Major depressive disorder, single episode, severe without psychotic features: F32.2

## 2016-05-12 MED ORDER — DOXYCYCLINE HYCLATE 100 MG PO TABS: 100.0000 mg | ORAL_TABLET | Freq: Two times a day (BID) | ORAL | 0 refills | Status: DC

## 2016-05-12 MED ORDER — BENZONATATE 100 MG PO CAPS: 100.0000 mg | ORAL_CAPSULE | Freq: Two times a day (BID) | ORAL | 0 refills | Status: DC | PRN

## 2016-05-12 MED ORDER — MIRTAZAPINE 30 MG PO TABS: 30.0000 mg | ORAL_TABLET | Freq: Every day | ORAL | 1 refills | Status: DC

## 2016-05-12 NOTE — Assessment & Plan Note (Signed)
**Note De-Identified Rutan Obfuscation** Refill Amlodipine.  Add Metoprlol 25 mg for tachycardia

## 2016-05-12 NOTE — Progress Notes (Signed)
**Note De-Identified Haley [name]** BP 124/89 (BP Location: Left Arm, Patient Position: Sitting, Cuff Size: Small)   Pulse (!) 121   Temp 99.8 F (37.7 C)   Ht 4' 11.8" (1.519 m)   Wt 87 lb 9.6 oz (39.7 kg)   SpO2 98%   BMI 17.22 kg/m    Subjective:    Patient ID: Haley [name], female    DOB: [birth year], 55 y.o.   MRN: [id number]  HPI: Haley [name] is a 55 y.o. female  Chief Complaint  Patient presents with  . Medication Refill    blood pressure meds.   . Chest Pain    patient complains of palpatations in chest as well as burning and aching in her chest. Says it is coming from anxiety she states that her axiety level is a 8 to a 10.    Anxiety States the Buspar and the anti-depressants just aren't "helping any more."  This is a life-long problem for her.  She can't recall what has worked for her in the past.  .    Depression screen [facility] 2/9 05/12/2016 01/03/2016 07/08/2015  Decreased Interest 3 0 3  Down, Depressed, Hopeless 3 0 1  PHQ - 2 Score 6 0 4  Altered sleeping 3 - 3  Tired, decreased energy 3 - 3  Change in appetite 3 - 3  Feeling bad or failure about yourself  3 - 0  Trouble concentrating 3 - 0  Moving slowly or fidgety/restless 2 - 0  Suicidal thoughts 1 - 0  PHQ-9 Score 24 - 13  Difficult doing work/chores - - Somewhat difficult    Hypertension Using medications without difficulty Average home BPs Not checking   No problems or lightheadedness No chest pain with exertion or shortness of breath No Edema  Cough  This is a new (was treated for double pneumonia one time and "almost died") problem. The current episode started in the past 7 days. The problem has been gradually worsening. The cough is productive of sputum. Pertinent negatives include no chest pain, chills, fever, shortness of breath or sweats. Nothing aggravates the symptoms. She has tried OTC cough suppressant for the symptoms. The treatment provided no relief. Her past medical history is significant for bronchitis and pneumonia.       Relevant past medical, surgical, family and social history reviewed and updated as indicated. Interim medical history since our last visit reviewed. Allergies and medications reviewed and updated.  Review of Systems  Constitutional: Negative for chills and fever.  Respiratory: Positive for cough. Negative for shortness of breath.   Cardiovascular: Negative for chest pain.    Per HPI unless specifically indicated above     Objective:    BP 124/89 (BP Location: Left Arm, Patient Position: Sitting, Cuff Size: Small)   Pulse (!) 121   Temp 99.8 F (37.7 C)   Ht 4' 11.8" (1.519 m)   Wt 87 lb 9.6 oz (39.7 kg)   SpO2 98%   BMI 17.22 kg/m   Wt Readings from Last 3 Encounters:  05/12/16 87 lb 9.6 oz (39.7 kg)  02/10/16 98 lb (44.5 kg)  01/09/16 90 lb (40.8 kg)    Physical Exam  Constitutional: She is oriented to person, place, and time. She appears well-developed and well-nourished. No distress.  HENT:  Head: Normocephalic and atraumatic.  Eyes: Conjunctivae and lids are normal. Right eye exhibits no discharge. Left eye exhibits no discharge. No scleral icterus.  Neck: Normal range of motion. Neck supple. No JVD present. **Note De-Identified Rabanal [name]** Carotid bruit is not present.  Cardiovascular: Normal rate, regular rhythm and normal heart sounds.   Pulmonary/Chest: Effort normal and breath sounds normal.  Abdominal: Normal appearance. There is no splenomegaly or hepatomegaly.  Musculoskeletal: Normal range of motion.  Neurological: She is alert and oriented to person, place, and time.  Skin: Skin is warm, dry and intact. No rash noted. No pallor.  Psychiatric: She has a normal mood and affect. Her behavior is normal. Judgment and thought content normal.    Results for orders placed or performed in visit on 05/10/15  Microscopic Examination  Result Value Ref Range   WBC, UA None seen 0 - 5 /hpf   RBC, UA 3-10 (A) 0 - 2 /hpf   Epithelial Cells (non renal) None seen 0 - 10 /hpf   Renal Epithel, UA  None seen None seen /hpf   Bacteria, UA None seen None seen/Few  Urinalysis, Complete  Result Value Ref Range   Specific Gravity, UA 1.015 1.005 - 1.030   pH, UA 6.5 5.0 - 7.5   Color, UA Yellow Yellow   Appearance Ur Clear Clear   Leukocytes, UA Negative Negative   Protein, UA Negative Negative/Trace   Glucose, UA Negative Negative   Ketones, UA Negative Negative   RBC, UA 1+ (A) Negative   Bilirubin, UA Negative Negative   Urobilinogen, Ur 0.2 0.2 - 1.0 mg/dL   Nitrite, UA Negative Negative   Microscopic Examination See below:       Assessment & Plan:   Problem List Items Addressed This Visit      Unprioritized   COPD (chronic obstructive pulmonary disease) (HCC)    Cough with flare.  Needs Spirometry will do next visit.  Start Doxycycline 100 mg BID.  Tessalon Perles.        Relevant Medications   benzonatate (TESSALON) 100 MG capsule   Hypertension    Refill Amlodipine.  Add Metoprlol 25 mg for tachycardia      Severe depression ([place]) - Primary    Refer to psychiatry.  Having trouble with sleeping and eating.  Rx for Remeron      Relevant Medications   mirtazapine (REMERON) 30 MG tablet   Other Relevant Orders   Ambulatory referral to Psychiatry    Other Visit Diagnoses   None.      Follow up plan: Return in about 1 week (around 05/19/2016) for needs spirometry next visit.

## 2016-05-12 NOTE — Assessment & Plan Note (Signed)
**Note De-Identified Man[place]co Obfuscation** Cough with flare.  Needs Spirometry will do next visit.  Start Doxycycline 100 mg BID.  Tessalon Perles.

## 2016-05-12 NOTE — Assessment & Plan Note (Signed)
**Note De-Identified Bookwalter Obfuscation** Refer to psychiatry.  Having trouble with sleeping and eating.  Rx for Remeron

## 2016-05-15 ENCOUNTER — Telehealth: Payer: Self-pay | Admitting: Unknown Physician Specialty

## 2016-05-15 NOTE — Telephone Encounter (Signed)
**Note De-identified Mcginnis Obfuscation** Routing to provider  

## 2016-05-15 NOTE — Telephone Encounter (Signed)
**Note De-Identified Lyssy Obfuscation** Pt called and stated that her doxycycline (VIBRA-TABS) 100 MG table is making her sick. she stated that she has been throwing up and she isn't taking anymore. She would like to have something else sent to  [facility] [place]

## 2016-05-19 ENCOUNTER — Encounter: Payer: Self-pay | Admitting: Unknown Physician Specialty

## 2016-05-19 ENCOUNTER — Telehealth: Payer: Self-pay

## 2016-05-19 ENCOUNTER — Ambulatory Visit (INDEPENDENT_AMBULATORY_CARE_PROVIDER_SITE_OTHER): Payer: BLUE CROSS/BLUE SHIELD | Admitting: Unknown Physician Specialty

## 2016-05-19 VITALS — BP 119/77 | HR 112 | Temp 98.9°F | Ht 59.8 in | Wt 88.6 lb

## 2016-05-19 DIAGNOSIS — J0191 Acute recurrent sinusitis, unspecified: Secondary | ICD-10-CM

## 2016-05-19 DIAGNOSIS — R002 Palpitations: Secondary | ICD-10-CM

## 2016-05-19 DIAGNOSIS — J449 Chronic obstructive pulmonary disease, unspecified: Secondary | ICD-10-CM | POA: Diagnosis not present

## 2016-05-19 DIAGNOSIS — I10 Essential (primary) hypertension: Secondary | ICD-10-CM | POA: Diagnosis not present

## 2016-05-19 MED ORDER — GLYCOPYRROLATE-FORMOTEROL 9-4.8 MCG/ACT IN AERO: 2.0000 | INHALATION_SPRAY | Freq: Two times a day (BID) | RESPIRATORY_TRACT | 3 refills | Status: DC

## 2016-05-19 MED ORDER — CEFDINIR 300 MG PO CAPS: 300.0000 mg | ORAL_CAPSULE | Freq: Two times a day (BID) | ORAL | 0 refills | Status: DC

## 2016-05-19 MED ORDER — METOPROLOL SUCCINATE ER 25 MG PO TB24: 25.0000 mg | ORAL_TABLET | Freq: Every day | ORAL | 1 refills | Status: DC

## 2016-05-19 MED ORDER — AMLODIPINE BESYLATE 5 MG PO TABS: 5.0000 mg | ORAL_TABLET | Freq: Every day | ORAL | 1 refills | Status: DC

## 2016-05-19 NOTE — Assessment & Plan Note (Signed)
**Note De-Identified Buckels Obfuscation** Refer to ENT due to severity of past infections.

## 2016-05-19 NOTE — Telephone Encounter (Signed)
**Note De-Identified Taussig Obfuscation** Pharmacy sent a fax stating that Bevespi rx would be $1,151.43 for the patient. Can we change the medication or do the PA?

## 2016-05-19 NOTE — Telephone Encounter (Signed)
**Note De-Identified Silguero Obfuscation** Cheryl-  Can we give the patient more samples until we can get something covered and have her call her insurance company?

## 2016-05-19 NOTE — Progress Notes (Signed)
**Note De-Identified Nouri Obfuscation** BP 119/77 (BP Location: Left Arm, Patient Position: Sitting, Cuff Size: Small)   Pulse (!) 112   Temp 98.9 F (37.2 C)   Ht 4' 11.8" (1.519 m)   Wt 88 lb 9.6 oz (40.2 kg)   SpO2 91%   BMI 17.42 kg/m    Subjective:    Patient ID: Haley [name], female    DOB: November 08, 1960, 55 y.o.   MRN: [id number]  HPI: Haley [name] is a 55 y.o. female  Chief Complaint  Patient presents with  . COPD    1 week f/up   "Please help me" States she is having a lot of pressure in her sinuses but getting some relief.  Did not tolerate doxycycline and now taking Omnicef.  States she went to ENT once and told she has not sinuses.  Reports having deptic meningitis at one point  COPD Lots of problems with pneumonia in past.  Has not had an appropriate CT work-up.   Hypertension Using medications without difficulty Out of BP medications.  Decent BP today despite this  Average home BPs    No problems or lightheadedness No chest pain with exertion or shortness of breath States her biggest issue is heart beating fast No Edema   Relevant past medical, surgical, family and social history reviewed and updated as indicated. Interim medical history since our last visit reviewed. Allergies and medications reviewed and updated.  Review of Systems  Per HPI unless specifically indicated above     Objective:    BP 119/77 (BP Location: Left Arm, Patient Position: Sitting, Cuff Size: Small)   Pulse (!) 112   Temp 98.9 F (37.2 C)   Ht 4' 11.8" (1.519 m)   Wt 88 lb 9.6 oz (40.2 kg)   SpO2 91%   BMI 17.42 kg/m   Wt Readings from Last 3 Encounters:  05/19/16 88 lb 9.6 oz (40.2 kg)  05/12/16 87 lb 9.6 oz (39.7 kg)  02/10/16 98 lb (44.5 kg)    Physical Exam  Constitutional: She is oriented to person, place, and time. She appears well-developed and well-nourished. No distress.  HENT:  Head: Normocephalic and atraumatic.  Right Ear: Tympanic membrane and ear canal normal.  Left Ear: Tympanic membrane and  ear canal normal.  Nose: No rhinorrhea. Right sinus exhibits maxillary sinus tenderness. Right sinus exhibits no frontal sinus tenderness. Left sinus exhibits maxillary sinus tenderness. Left sinus exhibits no frontal sinus tenderness.  Eyes: Conjunctivae and lids are normal. Right eye exhibits no discharge. Left eye exhibits no discharge. No scleral icterus.  Cardiovascular: Normal rate and regular rhythm.   Pulmonary/Chest: Effort normal and breath sounds normal. No respiratory distress.  Abdominal: Normal appearance. There is no splenomegaly or hepatomegaly.  Musculoskeletal: Normal range of motion.  Neurological: She is alert and oriented to person, place, and time.  Skin: Skin is intact. No rash noted. No pallor.  Psychiatric: She has a normal mood and affect. Her behavior is normal. Judgment and thought content normal.   Spirometry shows moderate COPD.    Results for orders placed or performed in visit on 05/10/15  Microscopic Examination  Result Value Ref Range   WBC, UA None seen 0 - 5 /hpf   RBC, UA 3-10 (A) 0 - 2 /hpf   Epithelial Cells (non renal) None seen 0 - 10 /hpf   Renal Epithel, UA None seen None seen /hpf   Bacteria, UA None seen None seen/Few  Urinalysis, Complete  Result Value Ref Range **Note De-Identified Kloepfer Obfuscation** Specific Gravity, UA 1.015 1.005 - 1.030   pH, UA 6.5 5.0 - 7.5   Color, UA Yellow Yellow   Appearance Ur Clear Clear   Leukocytes, UA Negative Negative   Protein, UA Negative Negative/Trace   Glucose, UA Negative Negative   Ketones, UA Negative Negative   RBC, UA 1+ (A) Negative   Bilirubin, UA Negative Negative   Urobilinogen, Ur 0.2 0.2 - 1.0 mg/dL   Nitrite, UA Negative Negative   Microscopic Examination See below:       Assessment & Plan:   Problem List Items Addressed This Visit      Unprioritized   Acute sinusitis    Refer to ENT due to severity of past infections.        Relevant Medications   cefdinir (OMNICEF) 300 MG capsule   Other Relevant Orders    Ambulatory referral to ENT   Hypertension    States tachycardia is biggest problem.  I would like to stop Metoprolol due to COPD but trouble with palpitations      Relevant Medications   metoprolol succinate (TOPROL-XL) 25 MG 24 hr tablet   amLODipine (NORVASC) 5 MG tablet   Moderate COPD (chronic obstructive pulmonary disease) (HCC) - Primary    Moderate.  Start Bevespi      Relevant Medications   Glycopyrrolate-Formoterol (BEVESPI AEROSPHERE) 9-4.8 MCG/ACT AERO    Other Visit Diagnoses    Heart palpitations       Relevant Orders   CBC with Differential/Platelet   Comprehensive metabolic panel       Follow up plan: Return in about 4 weeks (around 06/16/2016).

## 2016-05-19 NOTE — Assessment & Plan Note (Signed)
**Note De-Identified Vue Obfuscation** States tachycardia is biggest problem.  I would like to stop Metoprolol due to COPD but trouble with palpitations

## 2016-05-19 NOTE — Telephone Encounter (Signed)
**Note De-Identified Welling Obfuscation** Called and spoke to patient. I let her know that I was going to give her 2 more samples of bevespi until we can get prescription covered or changed. I asked patient who her primary insurance was and she said [employer]. I asked if she would be able to call and find out what inhalers would be covered and patient stated she would not know how to do this. I told the patient that I would do it for her as soon as I could and patient said she would come pick up samples tomorrow.

## 2016-05-19 NOTE — Telephone Encounter (Signed)
**Note De-Identified Soroka Obfuscation** I would like to know what medication in that class her insurance will cover

## 2016-05-19 NOTE — Assessment & Plan Note (Signed)
**Note De-Identified Hollingworth Obfuscation** Moderate.  Start [employer]

## 2016-05-19 NOTE — Telephone Encounter (Signed)
**Note De-Identified Fagin Obfuscation** Called and spoke to Parnell at the pharmacy. She placed the pharmacist on the phone and he said that they usually put some suggestions of medications on formulary but they did not for this one. He said the best thing to do would be to call the insurance company.

## 2016-05-19 NOTE — Telephone Encounter (Signed)
**Note De-Identified Turnbough Obfuscation** Yes.  But FYI, this pt is probably not going to be able to do this

## 2016-05-20 LAB — CBC WITH DIFFERENTIAL/PLATELET
BASOS ABS: 0.1 10*3/uL (ref 0.0–0.2)
Basos: 1 %
EOS (ABSOLUTE): 0.1 10*3/uL (ref 0.0–0.4)
Eos: 1 %
Hematocrit: 42.9 % (ref 34.0–46.6)
Hemoglobin: 15 g/dL (ref 11.1–15.9)
Immature Grans (Abs): 0 10*3/uL (ref 0.0–0.1)
Immature Granulocytes: 0 %
LYMPHS ABS: 3.2 10*3/uL — AB (ref 0.7–3.1)
LYMPHS: 31 %
MCH: 33.4 pg — AB (ref 26.6–33.0)
MCHC: 35 g/dL (ref 31.5–35.7)
MCV: 96 fL (ref 79–97)
MONOS ABS: 0.6 10*3/uL (ref 0.1–0.9)
Monocytes: 6 %
NEUTROS ABS: 6.4 10*3/uL (ref 1.4–7.0)
Neutrophils: 61 %
PLATELETS: 343 10*3/uL (ref 150–379)
RBC: 4.49 x10E6/uL (ref 3.77–5.28)
RDW: 13.6 % (ref 12.3–15.4)
WBC: 10.3 10*3/uL (ref 3.4–10.8)

## 2016-05-20 LAB — COMPREHENSIVE METABOLIC PANEL
A/G RATIO: 1.8 (ref 1.2–2.2)
ALK PHOS: 108 IU/L (ref 39–117)
ALT: 21 IU/L (ref 0–32)
AST: 21 IU/L (ref 0–40)
Albumin: 4.4 g/dL (ref 3.5–5.5)
BILIRUBIN TOTAL: 0.7 mg/dL (ref 0.0–1.2)
BUN / CREAT RATIO: 23 (ref 9–23)
BUN: 14 mg/dL (ref 6–24)
CHLORIDE: 94 mmol/L — AB (ref 96–106)
CO2: 31 mmol/L — ABNORMAL HIGH (ref 18–29)
Calcium: 10.3 mg/dL — ABNORMAL HIGH (ref 8.7–10.2)
Creatinine, Ser: 0.62 mg/dL (ref 0.57–1.00)
GFR calc Af Amer: 117 mL/min/{1.73_m2} (ref >59)
GFR calc non Af Amer: 101 mL/min/{1.73_m2} (ref >59)
GLUCOSE: 125 mg/dL — AB (ref 65–99)
Globulin, Total: 2.5 g/dL (ref 1.5–4.5)
POTASSIUM: 3.4 mmol/L — AB (ref 3.5–5.2)
Sodium: 143 mmol/L (ref 134–144)
Total Protein: 6.9 g/dL (ref 6.0–8.5)

## 2016-05-21 ENCOUNTER — Encounter: Payer: Self-pay | Admitting: Unknown Physician Specialty

## 2016-05-25 ENCOUNTER — Telehealth: Payer: Self-pay

## 2016-05-25 NOTE — Telephone Encounter (Signed)
**Note De-Identified Sui Obfuscation** Got a referral appointment in my in basket for this patient. Tried calling patient to make her aware of appointment but she did not answer so I left her a VM asking for her to please return my call.

## 2016-05-25 NOTE — Telephone Encounter (Signed)
**Note De-Identified Cortese Obfuscation** Called the provider services listed on the patient's insurance card. I went through the automated system and selected coverages. It asked it I wanted the information to be faxed and I said yes and provided the fax number. They said that they would fax the information within the hour.

## 2016-05-26 NOTE — Telephone Encounter (Signed)
**Note De-identified Katzenberger Obfuscation** Called and left patient a VM asking for her to please return my call.  

## 2016-05-27 NOTE — Telephone Encounter (Signed)
**Note De-Identified Schumm Obfuscation** Never received any information from [facility]. Went ahead and started the PA and will await results.

## 2016-05-28 NOTE — Telephone Encounter (Signed)
**Note De-Identified Decoteau Obfuscation** Called and left patient a VM asking for her to please return my call. This was the 3rd attempt at reaching the patient about her ENT referral. Will send her a letter with the appointment information.

## 2016-05-29 MED ORDER — UMECLIDINIUM-VILANTEROL 62.5-25 MCG/INH IN AEPB: 1.0000 | INHALATION_SPRAY | Freq: Every day | RESPIRATORY_TRACT | 12 refills | Status: DC

## 2016-05-29 NOTE — Telephone Encounter (Signed)
**Note De-Identified Haley [name]** Called and left patient a VM letting her know that first inhaler was not approved by insurance and that another was sent in for her.

## 2016-05-29 NOTE — Telephone Encounter (Signed)
**Note De-Identified Osborne Obfuscation** PA for Charolotte Eke was denied. Will doing the PA it had asked if the patient had tried Anoro or Stililo first. Can we please try to send in one of these medications instead?

## 2016-06-16 ENCOUNTER — Ambulatory Visit (INDEPENDENT_AMBULATORY_CARE_PROVIDER_SITE_OTHER): Payer: BLUE CROSS/BLUE SHIELD | Admitting: Unknown Physician Specialty

## 2016-06-16 ENCOUNTER — Encounter: Payer: Self-pay | Admitting: Unknown Physician Specialty

## 2016-06-16 VITALS — BP 118/81 | HR 102 | Temp 98.5°F | Wt 92.6 lb

## 2016-06-16 DIAGNOSIS — J449 Chronic obstructive pulmonary disease, unspecified: Secondary | ICD-10-CM | POA: Diagnosis not present

## 2016-06-16 DIAGNOSIS — Z23 Encounter for immunization: Secondary | ICD-10-CM | POA: Diagnosis not present

## 2016-06-16 NOTE — Progress Notes (Signed)
**Note De-Identified Barajas Obfuscation** BP 118/81 (BP Location: Left Arm, Patient Position: Sitting, Cuff Size: Small)   Pulse (!) 102   Temp 98.5 F (36.9 C)   Wt 92 lb 9.6 oz (42 kg) Comment: pt had shoes on  SpO2 96%   BMI 18.21 kg/m    Subjective:    Patient ID: Haley [name], female    DOB: 02/20/1961, 55 y.o.   MRN: [id number]  HPI: Haley [name] is a 55 y.o. female  Chief Complaint  Patient presents with  . COPD    4 week f/up   COPD "I feel 100% better"  States sinuses cleared up with strong antibiotics plus taking Bevespi.  ENT increased her Omeprazole and is taking Dimysta.    Relevant past medical, surgical, family and social history reviewed and updated as indicated. Interim medical history since our last visit reviewed. Allergies and medications reviewed and updated.  Review of Systems  Per HPI unless specifically indicated above     Objective:    BP 118/81 (BP Location: Left Arm, Patient Position: Sitting, Cuff Size: Small)   Pulse (!) 102   Temp 98.5 F (36.9 C)   Wt 92 lb 9.6 oz (42 kg) Comment: pt had shoes on  SpO2 96%   BMI 18.21 kg/m   Wt Readings from Last 3 Encounters:  06/16/16 92 lb 9.6 oz (42 kg)  05/19/16 88 lb 9.6 oz (40.2 kg)  05/12/16 87 lb 9.6 oz (39.7 kg)    Physical Exam  Constitutional: She is oriented to person, place, and time. She appears well-developed and well-nourished. No distress.  HENT:  Head: Normocephalic and atraumatic.  Eyes: Conjunctivae and lids are normal. Right eye exhibits no discharge. Left eye exhibits no discharge. No scleral icterus.  Neck: Normal range of motion. Neck supple. No JVD present. Carotid bruit is not present.  Cardiovascular: Normal rate, regular rhythm and normal heart sounds.   Pulmonary/Chest: Effort normal and breath sounds normal.  Abdominal: Normal appearance. There is no splenomegaly or hepatomegaly.  Musculoskeletal: Normal range of motion.  Neurological: She is alert and oriented to person, place, and time.  Skin: Skin is  warm, dry and intact. No rash noted. No pallor.  Psychiatric: She has a normal mood and affect. Her behavior is normal. Judgment and thought content normal.    Results for orders placed or performed in visit on 05/19/16  CBC with Differential/Platelet  Result Value Ref Range   WBC 10.3 3.4 - 10.8 x10E3/uL   RBC 4.49 3.77 - 5.28 x10E6/uL   Hemoglobin 15.0 11.1 - 15.9 g/dL   Hematocrit 42.9 34.0 - 46.6 %   MCV 96 79 - 97 fL   MCH 33.4 (H) 26.6 - 33.0 pg   MCHC 35.0 31.5 - 35.7 g/dL   RDW 13.6 12.3 - 15.4 %   Platelets 343 150 - 379 x10E3/uL   Neutrophils 61 Not Estab. %   Lymphs 31 Not Estab. %   Monocytes 6 Not Estab. %   Eos 1 Not Estab. %   Basos 1 Not Estab. %   Neutrophils Absolute 6.4 1.4 - 7.0 x10E3/uL   Lymphocytes Absolute 3.2 (H) 0.7 - 3.1 x10E3/uL   Monocytes Absolute 0.6 0.1 - 0.9 x10E3/uL   EOS (ABSOLUTE) 0.1 0.0 - 0.4 x10E3/uL   Basophils Absolute 0.1 0.0 - 0.2 x10E3/uL   Immature Granulocytes 0 Not Estab. %   Immature Grans (Abs) 0.0 0.0 - 0.1 x10E3/uL  Comprehensive metabolic panel  Result Value Ref Range **Note De-Identified Malena Obfuscation** Glucose 125 (H) 65 - 99 mg/dL   BUN 14 6 - 24 mg/dL   Creatinine, Ser 0.62 0.57 - 1.00 mg/dL   GFR calc non Af Amer 101 >59 mL/min/1.73   GFR calc Af Amer 117 >59 mL/min/1.73   BUN/Creatinine Ratio 23 9 - 23   Sodium 143 134 - 144 mmol/L   Potassium 3.4 (L) 3.5 - 5.2 mmol/L   Chloride 94 (L) 96 - 106 mmol/L   CO2 31 (H) 18 - 29 mmol/L   Calcium 10.3 (H) 8.7 - 10.2 mg/dL   Total Protein 6.9 6.0 - 8.5 g/dL   Albumin 4.4 3.5 - 5.5 g/dL   Globulin, Total 2.5 1.5 - 4.5 g/dL   Albumin/Globulin Ratio 1.8 1.2 - 2.2   Bilirubin Total 0.7 0.0 - 1.2 mg/dL   Alkaline Phosphatase 108 39 - 117 IU/L   AST 21 0 - 40 IU/L   ALT 21 0 - 32 IU/L      Assessment & Plan:   Problem List Items Addressed This Visit      Unprioritized   Moderate COPD (chronic obstructive pulmonary disease) (HCC)    Normal spirometry today      Relevant Medications    Azelastine-Fluticasone (DYMISTA) 137-50 MCG/ACT SUSP   Other Relevant Orders   Spirometry with Graph (Completed)    Other Visit Diagnoses    Need for influenza vaccination    -  Primary   Relevant Orders   Flu Vaccine QUAD 36+ mos IM (Completed)       Follow up plan: Return in about 6 months (around 12/14/2016).

## 2016-06-16 NOTE — Patient Instructions (Signed)
**Note De-identified Zettel Obfuscation** Influenza (Flu) Vaccine (Inactivated or Recombinant): What You Need to Know 1. Why get vaccinated? Influenza ("flu") is a contagious disease that spreads around the [place] every year, usually between October and May. Flu is caused by influenza viruses, and is spread mainly by coughing, sneezing, and close contact. Anyone can get flu. Flu strikes suddenly and can last several days. Symptoms vary by age, but can include:  fever/chills  sore throat  muscle aches  fatigue  cough  headache  runny or stuffy nose Flu can also lead to pneumonia and blood infections, and cause diarrhea and seizures in children. If you have a medical condition, such as heart or lung disease, flu can make it worse. Flu is more dangerous for some people. Infants and young children, people 65 years of age and older, pregnant women, and people with certain health conditions or a weakened immune system are at greatest risk. Each year thousands of people in the [place] die from flu, and many more are hospitalized. Flu vaccine can:  keep you from getting flu,  make flu less severe if you do get it, and  keep you from spreading flu to your family and other people. 2. Inactivated and recombinant flu vaccines A dose of flu vaccine is recommended every flu season. Children 6 months through 8 years of age may need two doses during the same flu season. Everyone else needs only one dose each flu season. Some inactivated flu vaccines contain a very small amount of a mercury-based preservative called thimerosal. Studies have not shown thimerosal in vaccines to be harmful, but flu vaccines that do not contain thimerosal are available. There is no live flu virus in flu shots. They cannot cause the flu. There are many flu viruses, and they are always changing. Each year a new flu vaccine is made to protect against three or four viruses that are likely to cause disease in the upcoming flu season. But even when the  vaccine doesn't exactly match these viruses, it may still provide some protection. Flu vaccine cannot prevent:  flu that is caused by a virus not covered by the vaccine, or  illnesses that look like flu but are not. It takes about 2 weeks for protection to develop after vaccination, and protection lasts through the flu season. 3. Some people should not get this vaccine Tell the person who is giving you the vaccine:  If you have any severe, life-threatening allergies. If you ever had a life-threatening allergic reaction after a dose of flu vaccine, or have a severe allergy to any part of this vaccine, you may be advised not to get vaccinated. Most, but not all, types of flu vaccine contain a small amount of egg protein.  If you ever had Guillain-Barr Syndrome (also called GBS). Some people with a history of GBS should not get this vaccine. This should be discussed with your doctor.  If you are not feeling well. It is usually okay to get flu vaccine when you have a mild illness, but you might be asked to come back when you feel better. 4. Risks of a vaccine reaction With any medicine, including vaccines, there is a chance of reactions. These are usually mild and go away on their own, but serious reactions are also possible. Most people who get a flu shot do not have any problems with it. Minor problems following a flu shot include:  soreness, redness, or swelling where the shot was given  hoarseness  sore, red or itchy  **Note De-identified Lundblad Obfuscation** eyes  cough  fever  aches  headache  itching  fatigue If these problems occur, they usually begin soon after the shot and last 1 or 2 days. More serious problems following a flu shot can include the following:  There may be a small increased risk of Guillain-Barre Syndrome (GBS) after inactivated flu vaccine. This risk has been estimated at 1 or 2 additional cases per million people vaccinated. This is much lower than the risk of severe complications from flu,  which can be prevented by flu vaccine.  Young children who get the flu shot along with pneumococcal vaccine (PCV13) and/or DTaP vaccine at the same time might be slightly more likely to have a seizure caused by fever. Ask your doctor for more information. Tell your doctor if a child who is getting flu vaccine has ever had a seizure. Problems that could happen after any injected vaccine:  People sometimes faint after a medical procedure, including vaccination. Sitting or lying down for about 15 minutes can help prevent fainting, and injuries caused by a fall. Tell your doctor if you feel dizzy, or have vision changes or ringing in the ears.  Some people get severe pain in the shoulder and have difficulty moving the arm where a shot was given. This happens very rarely.  Any medication can cause a severe allergic reaction. Such reactions from a vaccine are very rare, estimated at about 1 in a million doses, and would happen within a few minutes to a few hours after the vaccination. As with any medicine, there is a very remote chance of a vaccine causing a serious injury or death. The safety of vaccines is always being monitored. For more information, visit: www.cdc.gov/vaccinesafety/ 5. What if there is a serious reaction? What should I look for? Look for anything that concerns you, such as signs of a severe allergic reaction, very high fever, or unusual behavior. Signs of a severe allergic reaction can include hives, swelling of the face and throat, difficulty breathing, a fast heartbeat, dizziness, and weakness. These would start a few minutes to a few hours after the vaccination. What should I do?  If you think it is a severe allergic reaction or other emergency that can't wait, call 9-1-1 and get the person to the nearest hospital. Otherwise, call your doctor.  Reactions should be reported to the Vaccine Adverse Event Reporting System (VAERS). Your doctor should file this report, or you can do  it yourself through the VAERS web site at www.vaers.hhs.gov, or by calling 1-800-822-7967.  VAERS does not give medical advice. 6. The National Vaccine Injury Compensation Program The National Vaccine Injury Compensation Program (VICP) is a federal program that was created to compensate people who may have been injured by certain vaccines. Persons who believe they may have been injured by a vaccine can learn about the program and about filing a claim by calling 1-800-338-2382 or visiting the VICP website at www.hrsa.gov/vaccinecompensation. There is a time limit to file a claim for compensation. 7. How can I learn more?  Ask your healthcare provider. He or she can give you the vaccine package insert or suggest other sources of information.  Call your local or state health department.  Contact the Centers for Disease Control and Prevention (CDC):  Call 1-800-232-4636 (1-800-CDC-INFO) or  Visit CDC's website at www.cdc.gov/flu Vaccine Information Statement, Inactivated Influenza Vaccine (03/09/2014) This information is not intended to replace advice given to you by your health care provider. Make sure you discuss any questions you  **Note De-identified Biggar Obfuscation** have with your health care provider. Document Released: 05/14/2006 Document Revised: 04/09/2016 Document Reviewed: 04/09/2016 Elsevier Interactive Patient Education  2017 Elsevier Inc.  

## 2016-06-16 NOTE — Assessment & Plan Note (Signed)
**Note De-Identified Baucom Obfuscation** Normal spirometry today

## 2016-10-13 ENCOUNTER — Encounter: Payer: Self-pay | Admitting: Unknown Physician Specialty

## 2016-10-13 ENCOUNTER — Ambulatory Visit (INDEPENDENT_AMBULATORY_CARE_PROVIDER_SITE_OTHER): Payer: BLUE CROSS/BLUE SHIELD | Admitting: Unknown Physician Specialty

## 2016-10-13 DIAGNOSIS — J449 Chronic obstructive pulmonary disease, unspecified: Secondary | ICD-10-CM | POA: Diagnosis not present

## 2016-10-13 DIAGNOSIS — I10 Essential (primary) hypertension: Secondary | ICD-10-CM | POA: Diagnosis not present

## 2016-10-13 DIAGNOSIS — F322 Major depressive disorder, single episode, severe without psychotic features: Secondary | ICD-10-CM

## 2016-10-13 MED ORDER — MIRTAZAPINE 30 MG PO TABS: 30.0000 mg | ORAL_TABLET | Freq: Every day | ORAL | 1 refills | Status: DC

## 2016-10-13 MED ORDER — METOPROLOL SUCCINATE ER 50 MG PO TB24: 50.0000 mg | ORAL_TABLET | Freq: Every day | ORAL | 3 refills | Status: DC

## 2016-10-13 NOTE — Assessment & Plan Note (Signed)
**Note De-Identified Demos Obfuscation** Continue Bevespi.  Monitor for price

## 2016-10-13 NOTE — Progress Notes (Signed)
**Note De-Identified Michon Obfuscation** BP (!) 147/101 (BP Location: Left Arm, Cuff Size: Small)   Pulse 87   Temp 98.8 F (37.1 C)   Wt 91 lb (41.3 kg)   SpO2 96%   BMI 17.89 kg/m    Subjective:    Patient ID: Haley [name], female    DOB: 1961-05-23, 56 y.o.   MRN: [id number]  HPI: Haley [name] is a 56 y.o. female  Chief Complaint  Patient presents with  . Hypertension  . Medication Refill    pt state she needs all medications refilled    Pt states she stopped both her BP meds for about 4 month and off her depression and anxiety meds the same amount of time as she thought "I can beat it."  Hypertension Using medications without difficulty Average home BPs Not checking  No problems or lightheadedness No chest pain with exertion or shortness of breath No Edema  Depression States this is long-standing.  What she was on before "kept my head above water."  Noting palpitations.    Depression screen [facility] 2/9 10/13/2016 05/12/2016 01/03/2016 07/08/2015  Decreased Interest 3 3 0 3  Down, Depressed, Hopeless 3 3 0 1  PHQ - 2 Score 6 6 0 4  Altered sleeping 3 3 - 3  Tired, decreased energy 3 3 - 3  Change in appetite 3 3 - 3  Feeling bad or failure about yourself  1 3 - 0  Trouble concentrating 1 3 - 0  Moving slowly or fidgety/restless 0 2 - 0  Suicidal thoughts 0 1 - 0  PHQ-9 Score 17 24 - 13  Difficult doing work/chores - - - Somewhat difficult    COPD Pt is taking her inhalers and doing well.  She is taking Bevespi but concerned about insurance costs.     Relevant past medical, surgical, family and social history reviewed and updated as indicated. Interim medical history since our last visit reviewed. Allergies and medications reviewed and updated.  Review of Systems  Neurological: Positive for headaches.    Per HPI unless specifically indicated above     Objective:    BP (!) 147/101 (BP Location: Left Arm, Cuff Size: Small)   Pulse 87   Temp 98.8 F (37.1 C)   Wt 91 lb (41.3 kg)   SpO2 96%    BMI 17.89 kg/m   Wt Readings from Last 3 Encounters:  10/13/16 91 lb (41.3 kg)  06/16/16 92 lb 9.6 oz (42 kg)  05/19/16 88 lb 9.6 oz (40.2 kg)    Physical Exam  Constitutional: She is oriented to person, place, and time. She appears well-developed and well-nourished. No distress.  HENT:  Head: Normocephalic and atraumatic.  Eyes: Conjunctivae and lids are normal. Right eye exhibits no discharge. Left eye exhibits no discharge. No scleral icterus.  Neck: Normal range of motion. Neck supple. No JVD present. Carotid bruit is not present.  Cardiovascular: Normal rate, regular rhythm and normal heart sounds.   Pulmonary/Chest: Effort normal and breath sounds normal.  Abdominal: Normal appearance. There is no splenomegaly or hepatomegaly.  Musculoskeletal: Normal range of motion.  Neurological: She is alert and oriented to person, place, and time.  Skin: Skin is warm, dry and intact. No rash noted. No pallor.  Psychiatric: She has a normal mood and affect. Her behavior is normal. Judgment and thought content normal.    Assessment & Plan:   Problem List Items Addressed This Visit      Unprioritized   Hypertension **Note De-Identified Switala Obfuscation** Restart Metoprolol      Relevant Medications   metoprolol succinate (TOPROL-XL) 50 MG 24 hr tablet   Moderate COPD (chronic obstructive pulmonary disease) ([place])    Continue Bevespi.  Monitor for price      Severe depression (HCC)    Restart Mirtazipine      Relevant Medications   mirtazapine (REMERON) 30 MG tablet       Follow up plan: Return in about 4 weeks (around 11/10/2016).

## 2016-10-13 NOTE — Assessment & Plan Note (Signed)
**Note De-Identified Whitehurst Obfuscation** Restart Mirtazipine

## 2016-10-13 NOTE — Assessment & Plan Note (Signed)
**Note De-Identified Ehrhard Obfuscation** Restart Metoprolol

## 2016-10-15 ENCOUNTER — Telehealth: Payer: Self-pay | Admitting: Unknown Physician Specialty

## 2016-10-15 NOTE — Telephone Encounter (Signed)
**Note De-Identified Storie Obfuscation** Patient is checking to see if Haley [name] found an inhaler that her insurance will cover.  Please advise.  Thanks

## 2016-10-16 MED ORDER — GLYCOPYRROLATE-FORMOTEROL 9-4.8 MCG/ACT IN AERO: 2.0000 | INHALATION_SPRAY | Freq: Two times a day (BID) | RESPIRATORY_TRACT | 3 refills | Status: DC

## 2016-10-16 NOTE — Telephone Encounter (Signed)
**Note De-Identified Yokley Obfuscation** Per patient [facility] never received the script for the inhaler.  Can you send it over and she will try to see how much it will cost once they get the script.

## 2016-10-16 NOTE — Telephone Encounter (Signed)
**Note De-Identified Corter Obfuscation** Routing to provider. Malachy Mood do you know anything about this? Your note says to continue bevespi and watch costs.

## 2016-10-16 NOTE — Telephone Encounter (Signed)
**Note De-Identified Channing Obfuscation** Routing to provider. Last prescription sent in was from October. Can we send her in a new one?

## 2016-10-16 NOTE — Telephone Encounter (Signed)
**Note De-Identified Decarolis Obfuscation** She was supposed to let us know the cost at the pharmacy

## 2016-11-13 ENCOUNTER — Ambulatory Visit (INDEPENDENT_AMBULATORY_CARE_PROVIDER_SITE_OTHER): Payer: BLUE CROSS/BLUE SHIELD | Admitting: Unknown Physician Specialty

## 2016-11-13 ENCOUNTER — Encounter: Payer: Self-pay | Admitting: Unknown Physician Specialty

## 2016-11-13 VITALS — BP 125/84 | HR 87 | Temp 98.5°F | Ht 59.7 in | Wt 93.7 lb

## 2016-11-13 DIAGNOSIS — F322 Major depressive disorder, single episode, severe without psychotic features: Secondary | ICD-10-CM

## 2016-11-13 DIAGNOSIS — I10 Essential (primary) hypertension: Secondary | ICD-10-CM | POA: Diagnosis not present

## 2016-11-13 DIAGNOSIS — J0100 Acute maxillary sinusitis, unspecified: Secondary | ICD-10-CM

## 2016-11-13 MED ORDER — AMOXICILLIN-POT CLAVULANATE 875-125 MG PO TABS: 1.0000 | ORAL_TABLET | Freq: Two times a day (BID) | ORAL | 0 refills | Status: DC

## 2016-11-13 NOTE — Assessment & Plan Note (Signed)
**Note De-identified Salata Obfuscation** Stable, continue present medications.   

## 2016-11-13 NOTE — Assessment & Plan Note (Signed)
**Note De-identified Dukes Obfuscation** Stable, continue present medications.   

## 2016-11-13 NOTE — Progress Notes (Addendum)
**Note De-Identified Vereen Obfuscation** BP 125/84 (BP Location: Left Arm, Patient Position: Sitting, Cuff Size: Small)   Pulse 87   Temp 98.5 F (36.9 C)   Ht 4' 11.7" (1.516 m)   Wt 93 lb 11.2 oz (42.5 kg)   SpO2 96%   BMI 18.48 kg/m    Subjective:    Patient ID: Haley [name], female    DOB: 1961-02-03, 56 y.o.   MRN: [id number]  HPI: Haley [name] is a 56 y.o. female  Chief Complaint  Patient presents with  . Hypertension    4 week f/up  . URI    pt states she has been having a cough, aches, and congestion for about 2 and a half weeks now.   Hypertension Using medications without difficulty Average home BPs Not checking  No problems or lightheadedness No chest pain with exertion or shortness of breath No Edema   Depression Doing better with mirtazepine Depression screen [facility] 2/9 11/13/2016 10/13/2016 05/12/2016 01/03/2016 07/08/2015  Decreased Interest 0 3 3 0 3  Down, Depressed, Hopeless 0 3 3 0 1  PHQ - 2 Score 0 6 6 0 4  Altered sleeping - 3 3 - 3  Tired, decreased energy - 3 3 - 3  Change in appetite - 3 3 - 3  Feeling bad or failure about yourself  - 1 3 - 0  Trouble concentrating - 1 3 - 0  Moving slowly or fidgety/restless - 0 2 - 0  Suicidal thoughts - 0 1 - 0  PHQ-9 Score - 17 24 - 13  Difficult doing work/chores - - - - Somewhat difficult   URI   This is a new problem. The current episode started more than 1 month ago. The problem has been gradually worsening. There has been no fever. Associated symptoms include congestion, coughing, headaches, rhinorrhea, sinus pain and a sore throat. She has tried nothing for the symptoms.   Significant history for COPD.    Family History  Problem Relation Age of Onset  . Cancer Father     prostate  . Heart disease Father   . Heart attack Father   . Hypertension Father   . Stroke Neg Hx   . COPD Neg Hx    Past Medical History:  Diagnosis Date  . Anemia   . Anxiety   . CHF (congestive heart failure) ([place])    while hospitalized with pneumonia  .  Chronic pain syndrome   . COPD (chronic obstructive pulmonary disease) ([place])    while hospitalized  . Cyclic vomiting syndrome   . Cyclical vomiting   . Dysphagia   . GERD (gastroesophageal reflux disease)    controlled  . History of pneumonia   . History of sepsis   . Hypertension    controlled on meds  . Leukocytosis   . Osteopenia Nov 2015  . Tachycardia   . Thrombocytosis ([place])   . Vitamin D deficiency disease     Relevant past medical, surgical, family and social history reviewed and updated as indicated. Interim medical history since our last visit reviewed. Allergies and medications reviewed and updated.  Review of Systems  HENT: Positive for congestion, rhinorrhea, sinus pain and sore throat.   Respiratory: Positive for cough.   Neurological: Positive for headaches.    Per HPI unless specifically indicated above     Objective:    BP 125/84 (BP Location: Left Arm, Patient Position: Sitting, Cuff Size: Small)   Pulse 87   Temp 98.5 F (36.9 **Note De-Identified Moscoso Obfuscation** C)   Ht 4' 11.7" (1.516 m)   Wt 93 lb 11.2 oz (42.5 kg)   SpO2 96%   BMI 18.48 kg/m   Wt Readings from Last 3 Encounters:  11/13/16 93 lb 11.2 oz (42.5 kg)  10/13/16 91 lb (41.3 kg)  06/16/16 92 lb 9.6 oz (42 kg)    Physical Exam  Constitutional: She is oriented to person, place, and time. She appears well-developed and well-nourished. No distress.  HENT:  Head: Normocephalic and atraumatic.  Right Ear: Tympanic membrane and ear canal normal.  Left Ear: Tympanic membrane and ear canal normal.  Nose: No rhinorrhea. Right sinus exhibits maxillary sinus tenderness. Right sinus exhibits no frontal sinus tenderness. Left sinus exhibits maxillary sinus tenderness. Left sinus exhibits no frontal sinus tenderness.  Eyes: Conjunctivae and lids are normal. Right eye exhibits no discharge. Left eye exhibits no discharge. No scleral icterus.  Cardiovascular: Normal rate and regular rhythm.   Pulmonary/Chest: Effort normal and  breath sounds normal. No respiratory distress.  Abdominal: Normal appearance. There is no splenomegaly or hepatomegaly.  Musculoskeletal: Normal range of motion.  Neurological: She is alert and oriented to person, place, and time.  Skin: Skin is intact. No rash noted. No pallor.  Psychiatric: She has a normal mood and affect. Her behavior is normal. Judgment and thought content normal.    Results for orders placed or performed in visit on 05/19/16  CBC with Differential/Platelet  Result Value Ref Range   WBC 10.3 3.4 - 10.8 x10E3/uL   RBC 4.49 3.77 - 5.28 x10E6/uL   Hemoglobin 15.0 11.1 - 15.9 g/dL   Hematocrit 42.9 34.0 - 46.6 %   MCV 96 79 - 97 fL   MCH 33.4 (H) 26.6 - 33.0 pg   MCHC 35.0 31.5 - 35.7 g/dL   RDW 13.6 12.3 - 15.4 %   Platelets 343 150 - 379 x10E3/uL   Neutrophils 61 Not Estab. %   Lymphs 31 Not Estab. %   Monocytes 6 Not Estab. %   Eos 1 Not Estab. %   Basos 1 Not Estab. %   Neutrophils Absolute 6.4 1.4 - 7.0 x10E3/uL   Lymphocytes Absolute 3.2 (H) 0.7 - 3.1 x10E3/uL   Monocytes Absolute 0.6 0.1 - 0.9 x10E3/uL   EOS (ABSOLUTE) 0.1 0.0 - 0.4 x10E3/uL   Basophils Absolute 0.1 0.0 - 0.2 x10E3/uL   Immature Granulocytes 0 Not Estab. %   Immature Grans (Abs) 0.0 0.0 - 0.1 x10E3/uL  Comprehensive metabolic panel  Result Value Ref Range   Glucose 125 (H) 65 - 99 mg/dL   BUN 14 6 - 24 mg/dL   Creatinine, Ser 0.62 0.57 - 1.00 mg/dL   GFR calc non Af Amer 101 >59 mL/min/1.73   GFR calc Af Amer 117 >59 mL/min/1.73   BUN/Creatinine Ratio 23 9 - 23   Sodium 143 134 - 144 mmol/L   Potassium 3.4 (L) 3.5 - 5.2 mmol/L   Chloride 94 (L) 96 - 106 mmol/L   CO2 31 (H) 18 - 29 mmol/L   Calcium 10.3 (H) 8.7 - 10.2 mg/dL   Total Protein 6.9 6.0 - 8.5 g/dL   Albumin 4.4 3.5 - 5.5 g/dL   Globulin, Total 2.5 1.5 - 4.5 g/dL   Albumin/Globulin Ratio 1.8 1.2 - 2.2   Bilirubin Total 0.7 0.0 - 1.2 mg/dL   Alkaline Phosphatase 108 39 - 117 IU/L   AST 21 0 - 40 IU/L   ALT 21 0 - 32  IU/L **Note De-Identified Willard Obfuscation** Assessment & Plan:   Problem List Items Addressed This Visit      Unprioritized   Acute sinusitis - Primary   Relevant Medications   fexofenadine-pseudoephedrine (ALLEGRA-D 24) 180-240 MG 24 hr tablet   amoxicillin-clavulanate (AUGMENTIN) 875-125 MG tablet   Hypertension    Stable, continue present medications.        Severe depression (HCC)    Stable, continue present medications.            Follow up plan: Return in about 6 months (around 05/15/2017).

## 2016-12-14 ENCOUNTER — Ambulatory Visit: Payer: BLUE CROSS/BLUE SHIELD | Admitting: Unknown Physician Specialty

## 2017-01-10 ENCOUNTER — Emergency Department
Admission: EM | Admit: 2017-01-10 | Discharge: 2017-01-10 | Disposition: A | Discharge status: Home / Self Care | Payer: BLUE CROSS/BLUE SHIELD | Attending: Emergency Medicine | Admitting: Emergency Medicine

## 2017-01-10 ENCOUNTER — Encounter: Payer: Self-pay | Admitting: Emergency Medicine

## 2017-01-10 ENCOUNTER — Emergency Department: Payer: BLUE CROSS/BLUE SHIELD

## 2017-01-10 DIAGNOSIS — R109 Unspecified abdominal pain: Secondary | ICD-10-CM | POA: Diagnosis present

## 2017-01-10 DIAGNOSIS — I509 Heart failure, unspecified: Secondary | ICD-10-CM | POA: Insufficient documentation

## 2017-01-10 DIAGNOSIS — Z79899 Other long term (current) drug therapy: Secondary | ICD-10-CM | POA: Insufficient documentation

## 2017-01-10 DIAGNOSIS — K859 Acute pancreatitis without necrosis or infection, unspecified: Secondary | ICD-10-CM

## 2017-01-10 DIAGNOSIS — I110 Hypertensive heart disease with heart failure: Secondary | ICD-10-CM | POA: Diagnosis not present

## 2017-01-10 DIAGNOSIS — J449 Chronic obstructive pulmonary disease, unspecified: Secondary | ICD-10-CM | POA: Insufficient documentation

## 2017-01-10 DIAGNOSIS — F17210 Nicotine dependence, cigarettes, uncomplicated: Secondary | ICD-10-CM | POA: Insufficient documentation

## 2017-01-10 DIAGNOSIS — E876 Hypokalemia: Secondary | ICD-10-CM

## 2017-01-10 LAB — URINALYSIS, COMPLETE (UACMP) WITH MICROSCOPIC
Bacteria, UA: NONE SEEN
Bilirubin Urine: NEGATIVE
GLUCOSE, UA: NEGATIVE mg/dL
HGB URINE DIPSTICK: NEGATIVE
KETONES UR: NEGATIVE mg/dL
NITRITE: NEGATIVE
PH: 9 — AB (ref 5.0–8.0)
PROTEIN: NEGATIVE mg/dL
Specific Gravity, Urine: 1.01 (ref 1.005–1.030)

## 2017-01-10 LAB — LIPASE, BLOOD: Lipase: 83 U/L — ABNORMAL HIGH (ref 11–51)

## 2017-01-10 LAB — COMPREHENSIVE METABOLIC PANEL
ALK PHOS: 90 U/L (ref 38–126)
ALT: 18 U/L (ref 14–54)
ANION GAP: 9 (ref 5–15)
AST: 24 U/L (ref 15–41)
Albumin: 4 g/dL (ref 3.5–5.0)
BILIRUBIN TOTAL: 0.8 mg/dL (ref 0.3–1.2)
BUN: 5 mg/dL — ABNORMAL LOW (ref 6–20)
CALCIUM: 9.2 mg/dL (ref 8.9–10.3)
CO2: 32 mmol/L (ref 22–32)
Chloride: 100 mmol/L — ABNORMAL LOW (ref 101–111)
Creatinine, Ser: 0.6 mg/dL (ref 0.44–1.00)
GFR calc Af Amer: >60 mL/min (ref >60)
GFR calc non Af Amer: >60 mL/min (ref >60)
Glucose, Bld: 119 mg/dL — ABNORMAL HIGH (ref 65–99)
Potassium: 2.8 mmol/L — ABNORMAL LOW (ref 3.5–5.1)
SODIUM: 141 mmol/L (ref 135–145)
TOTAL PROTEIN: 7.1 g/dL (ref 6.5–8.1)

## 2017-01-10 LAB — CBC
HCT: 43.7 % (ref 35.0–47.0)
HEMOGLOBIN: 15.3 g/dL (ref 12.0–16.0)
MCH: 34.1 pg — ABNORMAL HIGH (ref 26.0–34.0)
MCHC: 35.1 g/dL (ref 32.0–36.0)
MCV: 97.2 fL (ref 80.0–100.0)
PLATELETS: 342 10*3/uL (ref 150–440)
RBC: 4.49 MIL/uL (ref 3.80–5.20)
RDW: 15.7 % — ABNORMAL HIGH (ref 11.5–14.5)
WBC: 13.2 10*3/uL — AB (ref 3.6–11.0)

## 2017-01-10 MED ORDER — POTASSIUM CHLORIDE CRYS ER 20 MEQ PO TBCR
40.0000 meq | EXTENDED_RELEASE_TABLET | Freq: Once | ORAL | Status: AC
  Administered 2017-01-10: 40 meq via ORAL
  Filled 2017-01-10: qty 2

## 2017-01-10 MED ORDER — ONDANSETRON HCL 4 MG/2ML IJ SOLN
4.0000 mg | Freq: Once | INTRAMUSCULAR | Status: AC
  Administered 2017-01-10: 4 mg via INTRAVENOUS
  Filled 2017-01-10: qty 2

## 2017-01-10 MED ORDER — ONDANSETRON 4 MG PO TBDP: 4.0000 mg | ORAL_TABLET | Freq: Three times a day (TID) | ORAL | 0 refills | Status: DC | PRN

## 2017-01-10 MED ORDER — OXYCODONE-ACETAMINOPHEN 5-325 MG PO TABS
1.0000 | ORAL_TABLET | Freq: Once | ORAL | Status: AC
  Administered 2017-01-10: 1 via ORAL
  Filled 2017-01-10: qty 1

## 2017-01-10 MED ORDER — OXYCODONE-ACETAMINOPHEN 5-325 MG PO TABS: 1.0000 | ORAL_TABLET | Freq: Four times a day (QID) | ORAL | 0 refills | Status: DC | PRN

## 2017-01-10 MED ORDER — IOPAMIDOL (ISOVUE-300) INJECTION 61%: 100.0000 mL | Freq: Once | INTRAVENOUS | Status: DC | PRN

## 2017-01-10 MED ORDER — IOPAMIDOL (ISOVUE-300) INJECTION 61%
75.0000 mL | Freq: Once | INTRAVENOUS | Status: AC | PRN
  Administered 2017-01-10: 75 mL via INTRAVENOUS

## 2017-01-10 MED ORDER — MORPHINE SULFATE (PF) 4 MG/ML IV SOLN
4.0000 mg | Freq: Once | INTRAVENOUS | Status: AC
  Administered 2017-01-10: 4 mg via INTRAVENOUS
  Filled 2017-01-10: qty 1

## 2017-01-10 NOTE — ED Provider Notes (Signed)
**Note De-Identified Milo Obfuscation** [facility] Emergency Department Provider Note   ____________________________________________   First MD Initiated Contact with Patient 01/10/17 0505     (approximate)  I have reviewed the triage vital signs and the nursing notes.   HISTORY  Chief Complaint Abdominal Pain    HPI Haley [name] is a 56 y.o. female who comes into the hospital today with abdominal pain. The patient reports it started this evening around 7. The patient reports that she drove her parents amounts today and when she was driving back she started having abdominal pain. She thought it was gas so chewed a few times. By the time she arrived home the pain was terrible. She drank baking soda and water and the pain continued to get worse. The patient endorses some nausea and she vomited once. The patient denies any diarrhea but has not had a bowel movement in a couple of days which is not uncommon for her. The patient reports that her pain at this time as a 3 out of 10 in intensity but earlier it was 8 out of 10 in intensity. She is here today for evaluation.   Past Medical History:  Diagnosis Date  . Anemia   . Anxiety   . CHF (congestive heart failure) ([place])    while hospitalized with pneumonia  . Chronic pain syndrome   . COPD (chronic obstructive pulmonary disease) ([place])    while hospitalized  . Cyclic vomiting syndrome   . Cyclical vomiting   . Dysphagia   . GERD (gastroesophageal reflux disease)    controlled  . History of pneumonia   . History of sepsis   . Hypertension    controlled on meds  . Leukocytosis   . Osteopenia Nov 2015  . Tachycardia   . Thrombocytosis ([place])   . Vitamin D deficiency disease     Patient Active Problem List   Diagnosis Date Noted  . Severe depression ([place]) 05/12/2016  . Special screening for malignant neoplasms, colon   . Benign neoplasm of sigmoid colon   . GERD (gastroesophageal reflux disease) 01/04/2016  . Acute sinusitis 09/01/2015    . Marital stress 07/12/2015  . Need for Tdap vaccination 07/12/2015  . Breast cancer screening 07/08/2015  . Pain due to dental caries 05/09/2015  . Abdominal pain 04/29/2015  . Hematuria, microscopic 04/26/2015  . Recurrent depressive disorder, current episode moderate ([place]) 04/24/2015  . Hypokalemia 04/24/2015  . Hypertension   . Vitamin D deficiency disease   . Cyclical vomiting   . Moderate COPD (chronic obstructive pulmonary disease) ([place])   . Thrombocytosis ([place])   . Osteopenia 06/03/2014    Past Surgical History:  Procedure Laterality Date  . CESAREAN SECTION     x 2  . COLONOSCOPY WITH PROPOFOL N/A 01/09/2016   Procedure: COLONOSCOPY WITH PROPOFOL;  Surgeon: Lucilla Lame, MD;  Location: [place] [facility];  Service: Endoscopy;  Laterality: N/A;  . POLYPECTOMY  01/09/2016   Procedure: POLYPECTOMY;  Surgeon: Lucilla Lame, MD;  Location: [place];  Service: Endoscopy;;    Prior to Admission medications   Medication Sig Start Date End Date Taking? Authorizing Provider  amoxicillin-clavulanate (AUGMENTIN) 875-125 MG tablet Take 1 tablet by mouth 2 (two) times daily. 11/13/16   Kathrine Haddock, NP  Cholecalciferol (D-3-5) 5000 units capsule Take 5,000 Units by mouth daily.    [provider]  Cyanocobalamin (VITAMIN B-12 PO) Take by mouth daily.    [provider]  fexofenadine-pseudoephedrine (ALLEGRA-D 24) 180-240 MG 24 **Note De-Identified Giovannetti Obfuscation** hr tablet Take 1 tablet by mouth daily.    [provider]  Glycopyrrolate-Formoterol (BEVESPI AEROSPHERE) 9-4.8 MCG/ACT AERO Inhale 2 puffs into the lungs 2 (two) times daily. 10/16/16   Kathrine Haddock, NP  metoprolol succinate (TOPROL-XL) 50 MG 24 hr tablet Take 1 tablet (50 mg total) by mouth daily. Take with or immediately following a meal. 10/13/16   Kathrine Haddock, NP  mirtazapine (REMERON) 30 MG tablet Take 1 tablet (30 mg total) by mouth at bedtime. 10/13/16   Kathrine Haddock, NP  Multiple Vitamin (MULTIVITAMIN) tablet Take 1  tablet by mouth daily.    [provider]  omeprazole (PRILOSEC) 40 MG capsule Take 40 mg by mouth daily.    [provider]  ondansetron (ZOFRAN ODT) 4 MG disintegrating tablet Take 1 tablet (4 mg total) by mouth every 8 (eight) hours as needed for nausea or vomiting. 01/10/17   Loney Hering, MD  oxyCODONE-acetaminophen (ROXICET) 5-325 MG tablet Take 1 tablet by mouth every 6 (six) hours as needed. 01/10/17   Loney Hering, MD    Allergies Doxycycline and Sulfur  Family History  Problem Relation Age of Onset  . Cancer Father        prostate  . Heart disease Father   . Heart attack Father   . Hypertension Father   . Stroke Neg Hx   . COPD Neg Hx     Social History Social History  Substance Use Topics  . Smoking status: Current Every Day Smoker    Packs/day: 1.00    Years: 35.00    Types: Cigarettes    Last attempt to quit: 08/03/2013  . Smokeless tobacco: Never Used  . Alcohol use 1.8 oz/week    3 Glasses of wine per week     Comment: occasional    Review of Systems  Constitutional: No fever/chills Eyes: No visual changes. ENT: No sore throat. Cardiovascular: Denies chest pain. Respiratory: Denies shortness of breath. Gastrointestinal: abdominal pain.  nausea, vomiting, constipation. Genitourinary: Negative for dysuria. Musculoskeletal: Negative for back pain. Skin: Negative for rash. Neurological: Negative for headaches, focal weakness or numbness.   ____________________________________________   PHYSICAL EXAM:  VITAL SIGNS: ED Triage Vitals [01/10/17 0257]  Enc Vitals Group     BP 139/84     Pulse Rate 80     Resp 20     Temp 98.3 F (36.8 C)     Temp Source Oral     SpO2 96 %     Weight 94 lb (42.6 kg)     Height 4\' 11"  (1.499 m)     Head Circumference      Peak Flow      Pain Score 5     Pain Loc      Pain Edu?      Excl. in [place]?     Constitutional: Alert and oriented. Well appearing and inModerate distress. Eyes:  Conjunctivae are normal. PERRL. EOMI. Head: Atraumatic. Nose: No congestion/rhinnorhea. Mouth/Throat: Mucous membranes are moist.  Oropharynx non-erythematous. Cardiovascular: Normal rate, regular rhythm. Grossly normal heart sounds.  Good peripheral circulation. Respiratory: Normal respiratory effort.  No retractions. Lungs CTAB. Gastrointestinal: Soft With some right-sided abdominal pain to palpation No distention. Positive bowel sounds Musculoskeletal: No lower extremity tenderness nor edema.   Neurologic:  Normal speech and language.  Skin:  Skin is warm, dry and intact.  Psychiatric: Mood and affect are normal.   ____________________________________________   LABS (all labs ordered are listed, but only abnormal results **Note De-Identified Wiker Obfuscation** are displayed)  Labs Reviewed  LIPASE, BLOOD - Abnormal; Notable for the following:       Result Value   Lipase 83 (*)    All other components within normal limits  COMPREHENSIVE METABOLIC PANEL - Abnormal; Notable for the following:    Potassium 2.8 (*)    Chloride 100 (*)    Glucose, Bld 119 (*)    BUN 5 (*)    All other components within normal limits  CBC - Abnormal; Notable for the following:    WBC 13.2 (*)    MCH 34.1 (*)    RDW 15.7 (*)    All other components within normal limits  URINALYSIS, COMPLETE (UACMP) WITH MICROSCOPIC - Abnormal; Notable for the following:    Color, Urine YELLOW (*)    APPearance CLEAR (*)    pH 9.0 (*)    Leukocytes, UA TRACE (*)    Squamous Epithelial / LPF 0-5 (*)    All other components within normal limits   ____________________________________________  EKG  ED ECG REPORT I, Loney Hering, the attending physician, personally viewed and interpreted this ECG.   Date: 01/10/2017  EKG Time: 353  Rate: 86  Rhythm: normal sinus rhythm  Axis: normal  Intervals:none  ST&T Change: none  ____________________________________________  RADIOLOGY  Ct Abdomen Pelvis W Contrast  Result Date:  01/10/2017 CLINICAL DATA:  Right-sided abdominal pain.  Episode of vomiting. EXAM: CT ABDOMEN AND PELVIS WITH CONTRAST TECHNIQUE: Multidetector CT imaging of the abdomen and pelvis was performed using the standard protocol following bolus administration of intravenous contrast. CONTRAST:  92mL ISOVUE-300 IOPAMIDOL (ISOVUE-300) INJECTION 61% COMPARISON:  Abdominal CT 05/03/2015, abdomen/pelvis CT 01/07/2014 FINDINGS: Lower chest: Breathing motion artifact. No consolidation. No pleural effusion. Hepatobiliary: Previous tiny cysts in the right lobe of the liver are not as well visualized on the current exam. No suspicious hepatic lesion. Minimal focal fatty infiltration adjacent to the falciform ligament. Gallbladder physiologically distended, no calcified stone. No biliary dilatation. Pancreas: Soft tissue stranding and fluid about the head of the pancreas with fluid tracking distally. Prominence of the proximal pancreatic duct measuring 3 mm. No organized peripancreatic fluid collection. Spleen: Normal in size without focal abnormality. Adrenals/Urinary Tract: Normal adrenal glands. No hydronephrosis or perinephric edema. Tiny cortical cysts in both kidneys. Urinary bladder is physiologically distended, no bladder wall thickening. Stomach/Bowel: Soft tissue stranding in the retroperitoneum about third portion of the duodenum, favored secondary to pancreatic process or duodenitis. Stomach physiologically distended. No bowel obstruction. Scattered diverticulosis in the sigmoid colon without acute inflammation. Moderate stool burden. Normal appendix. Vascular/Lymphatic: Aortic and branch atherosclerosis. No aneurysm. No abdominopelvic adenopathy. Reproductive: Uterus and bilateral adnexa are unremarkable. Other: Free fluid and inflammatory change tracks from the pancreatic head/duodenum in the retroperitoneum distally in the right lower quadrant. No organized fluid collection. No free air. Musculoskeletal: There are no  acute or suspicious osseous abnormalities. IMPRESSION: 1. Soft tissue stranding and fluid about the pancreatic head and duodenum, favor pancreatitis over duodenitis. Fluid tracks distally into the right lower abdomen. 2. Aortic atherosclerosis. 3. Minimal sigmoid colonic diverticulosis without acute inflammation. Electronically Signed   By: Jeb Levering M.D.   On: 01/10/2017 06:16    ____________________________________________   PROCEDURES  Procedure(s) performed: None  Procedures  Critical Care performed: No  ____________________________________________   INITIAL IMPRESSION / ASSESSMENT AND PLAN / ED COURSE  Pertinent labs & imaging results that were available during my care of the patient were reviewed by me and considered in **Note De-Identified Dahmer Obfuscation** my medical decision making (see chart for details).  This is a 56 year old female who comes into the hospital today with abdominal pain. I could not distended which between the right upper quadrant and the right lower quadrant so decision was made to send the patient for a CT scan of her abdomen. It was found that the patient has some stranding and fluid around the pancreatic head with a concern for pancreatitis. The patient did receive a dose of morphine which helped her pain initially so I gave her some Percocet. The patient also has some hypokalemia so I did order some potassium for her. As she is no longer vomiting and her pain is improved I feel that she should be able to go home. I explained to her that she has pancreatitis and she needs to follow-up with her primary care physician. The patient will be discharged home to follow-up with her primary care physician. She has no further complaints or concerns at this time.  Clinical Course as of Jan 10 802  Nancy Fetter Jan 10, 2017  0622 1. Soft tissue stranding and fluid about the pancreatic head and duodenum, favor pancreatitis over duodenitis. Fluid tracks distally into the right lower abdomen. 2. Aortic  atherosclerosis. 3. Minimal sigmoid colonic diverticulosis without acute inflammation.   CT Abdomen Pelvis W Contrast [AW]    Clinical Course User Index [AW] Loney Hering, MD     ____________________________________________   FINAL CLINICAL IMPRESSION(S) / ED DIAGNOSES  Final diagnoses:  Acute pancreatitis without infection or necrosis, unspecified pancreatitis type  Hypokalemia      NEW MEDICATIONS STARTED DURING THIS VISIT:  Discharge Medication List as of 01/10/2017  7:32 AM    START taking these medications   Details  ondansetron (ZOFRAN ODT) 4 MG disintegrating tablet Take 1 tablet (4 mg total) by mouth every 8 (eight) hours as needed for nausea or vomiting., Starting Sun 01/10/2017, Print    oxyCODONE-acetaminophen (ROXICET) 5-325 MG tablet Take 1 tablet by mouth every 6 (six) hours as needed., Starting Sun 01/10/2017, Print         Note:  This document was prepared using Dragon voice recognition software and may include unintentional dictation errors.    Loney Hering, MD 01/10/17 [id number]

## 2017-01-10 NOTE — ED Triage Notes (Signed)
**Note De-Identified Lederman Obfuscation** Pt to triage [facility] from home, report right sided abd pain tonight, with one episode vomiting.  EMS gave pt 75 fentanyl en route, pain improved from 8/10-5/10.  Pt denies hx of gall stones.  Pt still has appendix.

## 2017-01-11 ENCOUNTER — Telehealth: Payer: Self-pay | Admitting: Unknown Physician Specialty

## 2017-01-11 NOTE — Telephone Encounter (Signed)
**Note De-Identified Meadow Obfuscation** Have her start with clear liquids and progress from there.  If OK with clear liquids for 24 hours she can advance her diet as tolerated

## 2017-01-11 NOTE — Telephone Encounter (Signed)
**Note De-Identified Mcgrady Obfuscation** Called and spoke to patient. Patient states that she went to the ER last night and was reading on her after visit summary where it says not to eat for 3 to 4 days. I pulled up the patient's summary from the ER and it states that treatment for pancreatitis may include not eating for 3 to 4 days. I asked the patient if they told her to do this at the hospital and she states that she can't remember. Patient states that she has ate a small amount of grits and applesauce and has felt fine with eating them. I told them patient that I would run this by Malachy Mood to see what she says. Patient stated that I did not have to call her back if she was doing things right but asked for me to call back if needed.

## 2017-01-11 NOTE — Telephone Encounter (Signed)
**Note De-Identified Tobia Obfuscation** Called and left patient a VM letting her know Cheryl's directions. Asked for patient to call with any questions.

## 2017-01-11 NOTE — Telephone Encounter (Signed)
**Note De-Identified Ginley Obfuscation** Patient was seen at the ER over the weekend with severe stomach pain and was diagnosed with acute pancreatitis.  She has an appt with Malachy Mood Wednesday but she would like to speak with [place] regarding some questions she has.    Thank you

## 2017-01-13 ENCOUNTER — Encounter: Payer: Self-pay | Admitting: Unknown Physician Specialty

## 2017-01-13 ENCOUNTER — Ambulatory Visit (INDEPENDENT_AMBULATORY_CARE_PROVIDER_SITE_OTHER): Payer: BLUE CROSS/BLUE SHIELD | Admitting: Unknown Physician Specialty

## 2017-01-13 DIAGNOSIS — F322 Major depressive disorder, single episode, severe without psychotic features: Secondary | ICD-10-CM | POA: Diagnosis not present

## 2017-01-13 DIAGNOSIS — K859 Acute pancreatitis without necrosis or infection, unspecified: Secondary | ICD-10-CM

## 2017-01-13 DIAGNOSIS — E876 Hypokalemia: Secondary | ICD-10-CM

## 2017-01-13 HISTORY — DX: Acute pancreatitis without necrosis or infection, unspecified: K85.90

## 2017-01-13 HISTORY — DX: Hypokalemia: E87.6

## 2017-01-13 MED ORDER — MIRTAZAPINE 30 MG PO TABS: 30.0000 mg | ORAL_TABLET | Freq: Every day | ORAL | 6 refills | Status: DC

## 2017-01-13 NOTE — Progress Notes (Signed)
**Note De-Identified Alvira Obfuscation** BP 126/70 (BP Location: Left Arm, Patient Position: Sitting, Cuff Size: Small)   Pulse (!) 59   Temp 98.6 F (37 C)   Ht 4\' 11"  (1.499 m)   Wt 92 lb (41.7 kg)   SpO2 97%   BMI 18.58 kg/m    Subjective:    Patient ID: Haley [name], female    DOB: 07-31-1961, 56 y.o.   MRN: [id number]  HPI: Haley [name] is a 56 y.o. female  Chief Complaint  Patient presents with  . Hospitalization Follow-up    Patient states she's a alot better than she was, but still not 100%.  . Pancreatitis   Pt is here for f/u of pancreatitis.  She went to the hospital 3 days ago, was diagnosed [place] CT and discharged as she was stable.  Able to keep down food and fluids.  I reviewed notes, CT and labs from the ER.  Lipase elevated and potassium low.  Review of labs show he had another elevated lipase   She wine about once a week but not drinking that day.  She does have a lot of heart burn.  She is under a lot of stress.  Father with metastatic cancer.  Ran out of Mirtazapine and needs a refill.     Relevant past medical, surgical, family and social history reviewed and updated as indicated. Interim medical history since our last visit reviewed. Allergies and medications reviewed and updated.  Review of Systems  Per HPI unless specifically indicated above     Objective:    BP 126/70 (BP Location: Left Arm, Patient Position: Sitting, Cuff Size: Small)   Pulse (!) 59   Temp 98.6 F (37 C)   Ht 4\' 11"  (1.499 m)   Wt 92 lb (41.7 kg)   SpO2 97%   BMI 18.58 kg/m   Wt Readings from Last 3 Encounters:  01/13/17 92 lb (41.7 kg)  01/10/17 94 lb (42.6 kg)  11/13/16 93 lb 11.2 oz (42.5 kg)    Physical Exam  Constitutional: She is oriented to person, place, and time. She appears well-developed and well-nourished. No distress.  HENT:  Head: Normocephalic and atraumatic.  Eyes: Conjunctivae and lids are normal. Right eye exhibits no discharge. Left eye exhibits no discharge. No scleral icterus.  Neck:  Normal range of motion. Neck supple. No JVD present. Carotid bruit is not present.  Cardiovascular: Normal rate, regular rhythm and normal heart sounds.   Pulmonary/Chest: Effort normal and breath sounds normal.  Abdominal: Normal appearance. There is no splenomegaly or hepatomegaly. There is tenderness in the right upper quadrant and epigastric area. There is no rigidity, no rebound and no guarding.  Musculoskeletal: Normal range of motion.  Neurological: She is alert and oriented to person, place, and time.  Skin: Skin is warm, dry and intact. No rash noted. No pallor.  Psychiatric: She has a normal mood and affect. Her behavior is normal. Judgment and thought content normal.    Results for orders placed or performed during the hospital encounter of 01/10/17  Lipase, blood  Result Value Ref Range   Lipase 83 (H) 11 - 51 U/L  Comprehensive metabolic panel  Result Value Ref Range   Sodium 141 135 - 145 mmol/L   Potassium 2.8 (L) 3.5 - 5.1 mmol/L   Chloride 100 (L) 101 - 111 mmol/L   CO2 32 22 - 32 mmol/L   Glucose, Bld 119 (H) 65 - 99 mg/dL   BUN 5 (L) 6 - 20 **Note De-Identified Leavey Obfuscation** mg/dL   Creatinine, Ser 0.60 0.44 - 1.00 mg/dL   Calcium 9.2 8.9 - 10.3 mg/dL   Total Protein 7.1 6.5 - 8.1 g/dL   Albumin 4.0 3.5 - 5.0 g/dL   AST 24 15 - 41 U/L   ALT 18 14 - 54 U/L   Alkaline Phosphatase 90 38 - 126 U/L   Total Bilirubin 0.8 0.3 - 1.2 mg/dL   GFR calc non Af Amer >60 >60 mL/min   GFR calc Af Amer >60 >60 mL/min   Anion gap 9 5 - 15  CBC  Result Value Ref Range   WBC 13.2 (H) 3.6 - 11.0 K/uL   RBC 4.49 3.80 - 5.20 MIL/uL   Hemoglobin 15.3 12.0 - 16.0 g/dL   HCT 43.7 35.0 - 47.0 %   MCV 97.2 80.0 - 100.0 fL   MCH 34.1 (H) 26.0 - 34.0 pg   MCHC 35.1 32.0 - 36.0 g/dL   RDW 15.7 (H) 11.5 - 14.5 %   Platelets 342 150 - 440 K/uL  Urinalysis, Complete w Microscopic  Result Value Ref Range   Color, Urine YELLOW (A) YELLOW   APPearance CLEAR (A) CLEAR   Specific Gravity, Urine 1.010 1.005 - 1.030   pH  9.0 (H) 5.0 - 8.0   Glucose, UA NEGATIVE NEGATIVE mg/dL   Hgb urine dipstick NEGATIVE NEGATIVE   Bilirubin Urine NEGATIVE NEGATIVE   Ketones, ur NEGATIVE NEGATIVE mg/dL   Protein, ur NEGATIVE NEGATIVE mg/dL   Nitrite NEGATIVE NEGATIVE   Leukocytes, UA TRACE (A) NEGATIVE   RBC / HPF 0-5 0 - 5 RBC/hpf   WBC, UA 6-30 0 - 5 WBC/hpf   Bacteria, UA NONE SEEN NONE SEEN   Squamous Epithelial / LPF 0-5 (A) NONE SEEN   Mucous PRESENT   nn    Assessment & Plan:   Problem List Items Addressed This Visit      Unprioritized   Acute pancreatitis   Relevant Medications   mirtazapine (REMERON) 30 MG tablet   Other Relevant Orders   Comprehensive metabolic panel   Lipid Panel w/o Chol/HDL Ratio   US Abdomen Complete   Hypokalemia    Check labs today      Severe depression (HCC)    Restart Mirtazipine      Relevant Medications   mirtazapine (REMERON) 30 MG tablet       Follow up plan: Return in about 2 weeks (around 01/27/2017).

## 2017-01-13 NOTE — Assessment & Plan Note (Signed)
**Note De-identified Mangine Obfuscation** Check labs today.

## 2017-01-13 NOTE — Assessment & Plan Note (Signed)
**Note De-Identified Koury Obfuscation** Restart Mirtazipine

## 2017-01-14 ENCOUNTER — Encounter: Payer: Self-pay | Admitting: Unknown Physician Specialty

## 2017-01-14 LAB — COMPREHENSIVE METABOLIC PANEL
A/G RATIO: 1.7 (ref 1.2–2.2)
ALBUMIN: 4.3 g/dL (ref 3.5–5.5)
ALT: 18 IU/L (ref 0–32)
AST: 23 IU/L (ref 0–40)
Alkaline Phosphatase: 111 IU/L (ref 39–117)
BILIRUBIN TOTAL: 0.6 mg/dL (ref 0.0–1.2)
BUN/Creatinine Ratio: 9 (ref 9–23)
BUN: 6 mg/dL (ref 6–24)
CHLORIDE: 101 mmol/L (ref 96–106)
CO2: 23 mmol/L (ref 20–29)
Calcium: 9.7 mg/dL (ref 8.7–10.2)
Creatinine, Ser: 0.7 mg/dL (ref 0.57–1.00)
GFR calc Af Amer: 111 mL/min/{1.73_m2} (ref >59)
GFR calc non Af Amer: 96 mL/min/{1.73_m2} (ref >59)
GLOBULIN, TOTAL: 2.5 g/dL (ref 1.5–4.5)
GLUCOSE: 93 mg/dL (ref 65–99)
POTASSIUM: 4.7 mmol/L (ref 3.5–5.2)
SODIUM: 139 mmol/L (ref 134–144)
Total Protein: 6.8 g/dL (ref 6.0–8.5)

## 2017-01-14 LAB — LIPID PANEL W/O CHOL/HDL RATIO
Cholesterol, Total: 162 mg/dL (ref 100–199)
HDL: 47 mg/dL (ref >39)
LDL Calculated: 77 mg/dL (ref 0–99)
Triglycerides: 189 mg/dL — ABNORMAL HIGH (ref 0–149)
VLDL CHOLESTEROL CAL: 38 mg/dL (ref 5–40)

## 2017-01-15 ENCOUNTER — Telehealth: Payer: Self-pay | Admitting: Unknown Physician Specialty

## 2017-01-15 NOTE — Telephone Encounter (Signed)
**Note De-Identified Gambone Obfuscation** Please let her know her labs were normal.  The [place] has not been scheduled

## 2017-01-15 NOTE — Telephone Encounter (Signed)
**Note De-Identified Brandstetter Obfuscation** Discussed with pt that labs were normal.  She states she is feeling better.

## 2017-01-15 NOTE — Telephone Encounter (Signed)
**Note De-Identified Ludwig Obfuscation** Routing to provider. Her U/S has not been scheduled yet.

## 2017-01-15 NOTE — Telephone Encounter (Signed)
**Note De-Identified Moat Obfuscation** Patient asked if her blood work results were in from her 01/13/2017 visit and if she could know them. Patient also asked if her CT scan was scheduled.  Please Advise.  Thank you.

## 2017-01-18 ENCOUNTER — Telehealth: Payer: Self-pay

## 2017-01-18 NOTE — Telephone Encounter (Signed)
**Note De-Identified Weinkauf Obfuscation** Tried calling patient to notify her of her U/S appointment.  This Friday 01/22/2017 at 10:15am (arrive at 9:45) at the [place] off of [address]. No food or drink after midnight.   LVM for patient to return my call.

## 2017-01-18 NOTE — Telephone Encounter (Signed)
**Note De-identified Newhall Obfuscation** Patient notified

## 2017-01-18 NOTE — Telephone Encounter (Signed)
**Note De-Identified Cockerill Obfuscation** FYI in case this patient calls back. If she doesn't, could she be contacted again to notify her of her appointment?

## 2017-01-22 ENCOUNTER — Ambulatory Visit: Payer: BLUE CROSS/BLUE SHIELD

## 2017-01-27 ENCOUNTER — Ambulatory Visit: Payer: BLUE CROSS/BLUE SHIELD | Admitting: Unknown Physician Specialty

## 2017-01-29 ENCOUNTER — Ambulatory Visit
Admission: RE | Admit: 2017-01-29 | Discharge: 2017-01-29 | Disposition: A | Discharge status: Home / Self Care | Payer: BLUE CROSS/BLUE SHIELD | Source: Ambulatory Visit | Attending: Unknown Physician Specialty | Admitting: Unknown Physician Specialty

## 2017-01-29 ENCOUNTER — Encounter: Payer: Self-pay | Admitting: Family Medicine

## 2017-01-29 DIAGNOSIS — K859 Acute pancreatitis without necrosis or infection, unspecified: Secondary | ICD-10-CM | POA: Diagnosis present

## 2017-02-02 ENCOUNTER — Ambulatory Visit (INDEPENDENT_AMBULATORY_CARE_PROVIDER_SITE_OTHER): Payer: BLUE CROSS/BLUE SHIELD | Admitting: Unknown Physician Specialty

## 2017-02-02 ENCOUNTER — Encounter: Payer: Self-pay | Admitting: Unknown Physician Specialty

## 2017-02-02 VITALS — BP 119/80 | HR 86 | Temp 98.7°F | Wt 95.0 lb

## 2017-02-02 DIAGNOSIS — F419 Anxiety disorder, unspecified: Secondary | ICD-10-CM | POA: Diagnosis not present

## 2017-02-02 DIAGNOSIS — F411 Generalized anxiety disorder: Secondary | ICD-10-CM | POA: Insufficient documentation

## 2017-02-02 DIAGNOSIS — Z8719 Personal history of other diseases of the digestive system: Secondary | ICD-10-CM

## 2017-02-02 DIAGNOSIS — K589 Irritable bowel syndrome without diarrhea: Secondary | ICD-10-CM | POA: Insufficient documentation

## 2017-02-02 DIAGNOSIS — K580 Irritable bowel syndrome with diarrhea: Secondary | ICD-10-CM

## 2017-02-02 DIAGNOSIS — R1115 Cyclical vomiting syndrome unrelated to migraine: Secondary | ICD-10-CM

## 2017-02-02 DIAGNOSIS — G43A1 Cyclical vomiting, intractable: Secondary | ICD-10-CM

## 2017-02-02 HISTORY — DX: Irritable bowel syndrome, unspecified: K58.9

## 2017-02-02 MED ORDER — ONDANSETRON 4 MG PO TBDP: 4.0000 mg | ORAL_TABLET | Freq: Three times a day (TID) | ORAL | 0 refills | Status: DC | PRN

## 2017-02-02 MED ORDER — LORAZEPAM 1 MG PO TABS: 1.0000 mg | ORAL_TABLET | Freq: Every day | ORAL | 0 refills | Status: DC | PRN

## 2017-02-02 MED ORDER — ONDANSETRON HCL 4 MG PO TABS: 4.0000 mg | ORAL_TABLET | Freq: Three times a day (TID) | ORAL | 2 refills | Status: DC | PRN

## 2017-02-02 NOTE — Assessment & Plan Note (Signed)
**Note De-Identified Lampe Obfuscation** OK for occasional Ativan.  #30 should last 6 months

## 2017-02-02 NOTE — Progress Notes (Signed)
**Note De-Identified Stencil Obfuscation** BP 119/80   Pulse 86   Temp 98.7 F (37.1 C)   Wt 95 lb (43.1 kg)   SpO2 96%   BMI 19.19 kg/m    Subjective:    Patient ID: Haley [name], female    DOB: Dec 25, 1960, 56 y.o.   MRN: [id number]  HPI: Haley [name] is a 56 y.o. female  Chief Complaint  Patient presents with  . Pancreatitis    2 week f/up  . Depression    2 week f/up   "Nervous stomach" Has periods of vomiting, diarrhea. Prescribed Omeprazole but afraid to take it as she feels it causes cancer.    Anxiety Pt is taking Remeron.  On it for 2 weeks. Under a lot of stress with father has cancer and financial trouble.  She would like to have the occasional Ativan when anxiety gets overwhelming.  Thinks Mirtazepine is helping with appetite.   Depression screen [facility] 2/9 02/02/2017 11/13/2016 10/13/2016 05/12/2016 01/03/2016  Decreased Interest 2 0 3 3 0  Down, Depressed, Hopeless 1 0 3 3 0  PHQ - 2 Score 3 0 6 6 0  Altered sleeping 2 - 3 3 -  Tired, decreased energy 1 - 3 3 -  Change in appetite 1 - 3 3 -  Feeling bad or failure about yourself  0 - 1 3 -  Trouble concentrating 1 - 1 3 -  Moving slowly or fidgety/restless 0 - 0 2 -  Suicidal thoughts 0 - 0 1 -  PHQ-9 Score 8 - 17 24 -  Difficult doing work/chores - - - - -   Smoking Is considering patches  Relevant past medical, surgical, family and social history reviewed and updated as indicated. Interim medical history since our last visit reviewed. Allergies and medications reviewed and updated.  Review of Systems  Per HPI unless specifically indicated above     Objective:    BP 119/80   Pulse 86   Temp 98.7 F (37.1 C)   Wt 95 lb (43.1 kg)   SpO2 96%   BMI 19.19 kg/m   Wt Readings from Last 3 Encounters:  02/02/17 95 lb (43.1 kg)  01/13/17 92 lb (41.7 kg)  01/10/17 94 lb (42.6 kg)    Physical Exam  Constitutional: She is oriented to person, place, and time. She appears well-developed and well-nourished. No distress.  HENT:  Head: Normocephalic  and atraumatic.  Eyes: Conjunctivae and lids are normal. Right eye exhibits no discharge. Left eye exhibits no discharge. No scleral icterus.  Neck: Normal range of motion. Neck supple. No JVD present. Carotid bruit is not present.  Cardiovascular: Normal rate, regular rhythm and normal heart sounds.   Pulmonary/Chest: Effort normal and breath sounds normal.  Abdominal: Normal appearance. There is no splenomegaly or hepatomegaly.  Musculoskeletal: Normal range of motion.  Neurological: She is alert and oriented to person, place, and time.  Skin: Skin is warm, dry and intact. No rash noted. No pallor.  Psychiatric: She has a normal mood and affect. Her behavior is normal. Judgment and thought content normal.    Results for orders placed or performed in visit on 01/13/17  Comprehensive metabolic panel  Result Value Ref Range   Glucose 93 65 - 99 mg/dL   BUN 6 6 - 24 mg/dL   Creatinine, Ser 0.70 0.57 - 1.00 mg/dL   GFR calc non Af Amer 96 >59 mL/min/1.73   GFR calc Af Amer 111 >59 mL/min/1.73   BUN/Creatinine Ratio 9 **Note De-Identified Batts Obfuscation** 9 - 23   Sodium 139 134 - 144 mmol/L   Potassium 4.7 3.5 - 5.2 mmol/L   Chloride 101 96 - 106 mmol/L   CO2 23 20 - 29 mmol/L   Calcium 9.7 8.7 - 10.2 mg/dL   Total Protein 6.8 6.0 - 8.5 g/dL   Albumin 4.3 3.5 - 5.5 g/dL   Globulin, Total 2.5 1.5 - 4.5 g/dL   Albumin/Globulin Ratio 1.7 1.2 - 2.2   Bilirubin Total 0.6 0.0 - 1.2 mg/dL   Alkaline Phosphatase 111 39 - 117 IU/L   AST 23 0 - 40 IU/L   ALT 18 0 - 32 IU/L  Lipid Panel w/o Chol/HDL Ratio  Result Value Ref Range   Cholesterol, Total 162 100 - 199 mg/dL   Triglycerides 189 (H) 0 - 149 mg/dL   HDL 47 >39 mg/dL   VLDL Cholesterol Cal 38 5 - 40 mg/dL   LDL Calculated 77 0 - 99 mg/dL      Assessment & Plan:   Problem List Items Addressed This Visit      Unprioritized   Anxiety    OK for occasional Ativan.  #30 should last 6 months      Cyclical vomiting    Refill Zofran      Relevant Medications    ondansetron (ZOFRAN) 4 MG tablet   Other Relevant Orders   Ambulatory referral to Gastroenterology   IBS (irritable bowel syndrome)    Continue with Remeron      Relevant Medications   ondansetron (ZOFRAN ODT) 4 MG disintegrating tablet   ondansetron (ZOFRAN) 4 MG tablet    Other Visit Diagnoses    History of pancreatitis    -  Primary   Relevant Orders   Amylase   Lipase   CBC with Differential/Platelet       Follow up plan: Return in about 3 months (around 05/05/2017).

## 2017-02-02 NOTE — Assessment & Plan Note (Signed)
**Note De-Identified Vanaken Obfuscation** Continue with Remeron

## 2017-02-02 NOTE — Assessment & Plan Note (Signed)
**Note De-identified Idrovo Obfuscation** Refill Zofran

## 2017-02-03 LAB — CBC WITH DIFFERENTIAL/PLATELET
BASOS ABS: 0.1 10*3/uL (ref 0.0–0.2)
Basos: 1 %
EOS (ABSOLUTE): 0.2 10*3/uL (ref 0.0–0.4)
Eos: 2 %
HEMATOCRIT: 45.3 % (ref 34.0–46.6)
HEMOGLOBIN: 14.9 g/dL (ref 11.1–15.9)
Immature Grans (Abs): 0 10*3/uL (ref 0.0–0.1)
Immature Granulocytes: 0 %
LYMPHS ABS: 2.1 10*3/uL (ref 0.7–3.1)
Lymphs: 30 %
MCH: 33.1 pg — AB (ref 26.6–33.0)
MCHC: 32.9 g/dL (ref 31.5–35.7)
MCV: 101 fL — ABNORMAL HIGH (ref 79–97)
MONOS ABS: 0.4 10*3/uL (ref 0.1–0.9)
Monocytes: 5 %
NEUTROS ABS: 4.4 10*3/uL (ref 1.4–7.0)
Neutrophils: 62 %
Platelets: 357 10*3/uL (ref 150–379)
RBC: 4.5 x10E6/uL (ref 3.77–5.28)
RDW: 14 % (ref 12.3–15.4)
WBC: 7.1 10*3/uL (ref 3.4–10.8)

## 2017-02-03 LAB — LIPASE: LIPASE: 29 U/L (ref 14–72)

## 2017-02-03 LAB — AMYLASE: Amylase: 48 U/L (ref 31–124)

## 2017-02-04 ENCOUNTER — Encounter: Payer: Self-pay | Admitting: Unknown Physician Specialty

## 2017-02-23 ENCOUNTER — Encounter: Payer: Self-pay | Admitting: Gastroenterology

## 2017-03-18 ENCOUNTER — Inpatient Hospital Stay
Admission: EM | Admit: 2017-03-18 | Discharge: 2017-03-21 | DRG: 392 | Disposition: A | Discharge status: Home / Self Care | Payer: BLUE CROSS/BLUE SHIELD | Attending: Surgery | Admitting: Surgery

## 2017-03-18 ENCOUNTER — Encounter: Payer: Self-pay | Admitting: Emergency Medicine

## 2017-03-18 ENCOUNTER — Emergency Department: Payer: BLUE CROSS/BLUE SHIELD

## 2017-03-18 DIAGNOSIS — R1310 Dysphagia, unspecified: Secondary | ICD-10-CM | POA: Diagnosis present

## 2017-03-18 DIAGNOSIS — Z8249 Family history of ischemic heart disease and other diseases of the circulatory system: Secondary | ICD-10-CM | POA: Diagnosis not present

## 2017-03-18 DIAGNOSIS — F411 Generalized anxiety disorder: Secondary | ICD-10-CM | POA: Diagnosis present

## 2017-03-18 DIAGNOSIS — F419 Anxiety disorder, unspecified: Secondary | ICD-10-CM | POA: Diagnosis present

## 2017-03-18 DIAGNOSIS — Z79899 Other long term (current) drug therapy: Secondary | ICD-10-CM

## 2017-03-18 DIAGNOSIS — F17210 Nicotine dependence, cigarettes, uncomplicated: Secondary | ICD-10-CM | POA: Diagnosis present

## 2017-03-18 DIAGNOSIS — R109 Unspecified abdominal pain: Secondary | ICD-10-CM

## 2017-03-18 DIAGNOSIS — I10 Essential (primary) hypertension: Secondary | ICD-10-CM | POA: Diagnosis present

## 2017-03-18 DIAGNOSIS — G894 Chronic pain syndrome: Secondary | ICD-10-CM | POA: Diagnosis present

## 2017-03-18 DIAGNOSIS — K5792 Diverticulitis of intestine, part unspecified, without perforation or abscess without bleeding: Secondary | ICD-10-CM

## 2017-03-18 DIAGNOSIS — K219 Gastro-esophageal reflux disease without esophagitis: Secondary | ICD-10-CM | POA: Diagnosis present

## 2017-03-18 DIAGNOSIS — K589 Irritable bowel syndrome without diarrhea: Secondary | ICD-10-CM | POA: Diagnosis present

## 2017-03-18 DIAGNOSIS — Z888 Allergy status to other drugs, medicaments and biological substances status: Secondary | ICD-10-CM | POA: Diagnosis not present

## 2017-03-18 DIAGNOSIS — K5720 Diverticulitis of large intestine with perforation and abscess without bleeding: Secondary | ICD-10-CM | POA: Diagnosis present

## 2017-03-18 DIAGNOSIS — Z881 Allergy status to other antibiotic agents status: Secondary | ICD-10-CM

## 2017-03-18 DIAGNOSIS — J449 Chronic obstructive pulmonary disease, unspecified: Secondary | ICD-10-CM | POA: Diagnosis present

## 2017-03-18 HISTORY — DX: Diverticulitis of intestine, part unspecified, without perforation or abscess without bleeding: K57.92

## 2017-03-18 LAB — COMPREHENSIVE METABOLIC PANEL
ALT: 12 U/L — ABNORMAL LOW (ref 14–54)
AST: 23 U/L (ref 15–41)
Albumin: 4.4 g/dL (ref 3.5–5.0)
Alkaline Phosphatase: 106 U/L (ref 38–126)
Anion gap: 14 (ref 5–15)
BUN: 20 mg/dL (ref 6–20)
CO2: 30 mmol/L (ref 22–32)
Calcium: 9.6 mg/dL (ref 8.9–10.3)
Chloride: 96 mmol/L — ABNORMAL LOW (ref 101–111)
Creatinine, Ser: 0.81 mg/dL (ref 0.44–1.00)
GFR calc Af Amer: >60 mL/min (ref >60)
GFR calc non Af Amer: >60 mL/min (ref >60)
Glucose, Bld: 143 mg/dL — ABNORMAL HIGH (ref 65–99)
Potassium: 3.3 mmol/L — ABNORMAL LOW (ref 3.5–5.1)
Sodium: 140 mmol/L (ref 135–145)
Total Bilirubin: 1.9 mg/dL — ABNORMAL HIGH (ref 0.3–1.2)
Total Protein: 8.2 g/dL — ABNORMAL HIGH (ref 6.5–8.1)

## 2017-03-18 LAB — CBC
HEMATOCRIT: 51.1 % — AB (ref 35.0–47.0)
Hemoglobin: 17.4 g/dL — ABNORMAL HIGH (ref 12.0–16.0)
MCH: 32.5 pg (ref 26.0–34.0)
MCHC: 34.1 g/dL (ref 32.0–36.0)
MCV: 95.4 fL (ref 80.0–100.0)
Platelets: 331 10*3/uL (ref 150–440)
RBC: 5.36 MIL/uL — ABNORMAL HIGH (ref 3.80–5.20)
RDW: 14.4 % (ref 11.5–14.5)
WBC: 17.3 10*3/uL — ABNORMAL HIGH (ref 3.6–11.0)

## 2017-03-18 LAB — URINALYSIS, COMPLETE (UACMP) WITH MICROSCOPIC
Bacteria, UA: NONE SEEN
Bilirubin Urine: NEGATIVE
Glucose, UA: NEGATIVE mg/dL
Ketones, ur: 20 mg/dL — AB
Nitrite: NEGATIVE
Protein, ur: NEGATIVE mg/dL
Specific Gravity, Urine: >1.046 — ABNORMAL HIGH (ref 1.005–1.030)
pH: 8 (ref 5.0–8.0)

## 2017-03-18 LAB — TYPE AND SCREEN
ABO/RH(D): O POS
ANTIBODY SCREEN: NEGATIVE

## 2017-03-18 LAB — LIPASE, BLOOD: Lipase: 24 U/L (ref 11–51)

## 2017-03-18 MED ORDER — NICOTINE 14 MG/24HR TD PT24
14.0000 mg | MEDICATED_PATCH | Freq: Every day | TRANSDERMAL | Status: DC
  Administered 2017-03-18 – 2017-03-20 (×3): 14 mg via TRANSDERMAL
  Filled 2017-03-18 (×3): qty 1

## 2017-03-18 MED ORDER — IOPAMIDOL (ISOVUE-300) INJECTION 61%
75.0000 mL | Freq: Once | INTRAVENOUS | Status: AC | PRN
  Administered 2017-03-18: 75 mL via INTRAVENOUS

## 2017-03-18 MED ORDER — MORPHINE SULFATE (PF) 2 MG/ML IV SOLN
2.0000 mg | INTRAVENOUS | Status: DC | PRN
  Administered 2017-03-18 – 2017-03-19 (×3): 2 mg via INTRAVENOUS
  Filled 2017-03-18 (×3): qty 1

## 2017-03-18 MED ORDER — KCL IN DEXTROSE-NACL 20-5-0.45 MEQ/L-%-% IV SOLN
INTRAVENOUS | Status: DC
  Administered 2017-03-18 – 2017-03-20 (×4): via INTRAVENOUS
  Filled 2017-03-18 (×9): qty 1000

## 2017-03-18 MED ORDER — KETOROLAC TROMETHAMINE 30 MG/ML IJ SOLN
30.0000 mg | Freq: Four times a day (QID) | INTRAMUSCULAR | Status: AC
  Administered 2017-03-18 – 2017-03-20 (×6): 30 mg via INTRAVENOUS
  Filled 2017-03-18 (×6): qty 1

## 2017-03-18 MED ORDER — PIPERACILLIN-TAZOBACTAM 3.375 G IVPB
3.3750 g | Freq: Three times a day (TID) | INTRAVENOUS | Status: DC
  Administered 2017-03-18 – 2017-03-21 (×8): 3.375 g via INTRAVENOUS
  Filled 2017-03-18 (×8): qty 50

## 2017-03-18 MED ORDER — PIPERACILLIN-TAZOBACTAM 3.375 G IVPB 30 MIN
3.3750 g | Freq: Once | INTRAVENOUS | Status: AC
  Administered 2017-03-18: 3.375 g via INTRAVENOUS

## 2017-03-18 MED ORDER — MORPHINE SULFATE (PF) 4 MG/ML IV SOLN
4.0000 mg | INTRAVENOUS | Status: DC | PRN
  Administered 2017-03-18 (×2): 4 mg via INTRAVENOUS
  Filled 2017-03-18: qty 1

## 2017-03-18 MED ORDER — KETOROLAC TROMETHAMINE 30 MG/ML IJ SOLN
30.0000 mg | Freq: Once | INTRAMUSCULAR | Status: AC
  Administered 2017-03-18: 30 mg via INTRAVENOUS
  Filled 2017-03-18: qty 1

## 2017-03-18 MED ORDER — PIPERACILLIN-TAZOBACTAM 3.375 G IVPB: 3.3750 g | Freq: Three times a day (TID) | INTRAVENOUS | Status: DC

## 2017-03-18 MED ORDER — ONDANSETRON 4 MG PO TBDP: 4.0000 mg | ORAL_TABLET | Freq: Four times a day (QID) | ORAL | Status: DC | PRN

## 2017-03-18 MED ORDER — SODIUM CHLORIDE 0.9 % IV BOLUS (SEPSIS)
1000.0000 mL | Freq: Once | INTRAVENOUS | Status: AC
  Administered 2017-03-18: 1000 mL via INTRAVENOUS

## 2017-03-18 MED ORDER — PROMETHAZINE HCL 25 MG/ML IJ SOLN
12.5000 mg | Freq: Four times a day (QID) | INTRAMUSCULAR | Status: DC | PRN
  Administered 2017-03-18 – 2017-03-21 (×5): 12.5 mg via INTRAVENOUS
  Filled 2017-03-18 (×5): qty 1

## 2017-03-18 MED ORDER — PIPERACILLIN-TAZOBACTAM 3.375 G IVPB 30 MIN
INTRAVENOUS | Status: AC
  Filled 2017-03-18: qty 50

## 2017-03-18 MED ORDER — ENOXAPARIN SODIUM 40 MG/0.4ML ~~LOC~~ SOLN
40.0000 mg | SUBCUTANEOUS | Status: DC
  Administered 2017-03-18: 40 mg via SUBCUTANEOUS
  Filled 2017-03-18: qty 0.4

## 2017-03-18 MED ORDER — MORPHINE SULFATE (PF) 2 MG/ML IV SOLN
INTRAVENOUS | Status: AC
  Filled 2017-03-18: qty 2

## 2017-03-18 MED ORDER — HALOPERIDOL LACTATE 5 MG/ML IJ SOLN
5.0000 mg | Freq: Once | INTRAMUSCULAR | Status: AC
  Administered 2017-03-18: 5 mg via INTRAVENOUS
  Filled 2017-03-18: qty 1

## 2017-03-18 MED ORDER — ONDANSETRON HCL 4 MG/2ML IJ SOLN: 4.0000 mg | Freq: Four times a day (QID) | INTRAMUSCULAR | Status: DC | PRN

## 2017-03-18 NOTE — ED Provider Notes (Signed)
**Note De-Identified Barrientes Obfuscation** [facility] Emergency Department Provider Note    First MD Initiated Contact with Patient 03/18/17 1313     (approximate)  I have reviewed the triage vital signs and the nursing notes.   HISTORY  Chief Complaint Abdominal Pain    HPI Haley [name] is a 56 y.o. female chief complaint of crampy diffuse abdominal pain the past 2-3 days at home associated with nausea, nonbloody nonbilious vomiting and diarrhea. Patient states that she felt like she was constipated and took her mother's Dulcolax but vomited it up. No fevers at home. No shortness of breath or chest pain. No pain with moving her bowels. No recent antibiotics. Feels that the pain is different from her pancreatitis that is lower in location. Rates the pain as mild to moderate.   Past Medical History:  Diagnosis Date  . Anemia   . Anxiety   . CHF (congestive heart failure) ([place])    while hospitalized with pneumonia  . Chronic pain syndrome   . COPD (chronic obstructive pulmonary disease) ([place])    while hospitalized  . Cyclic vomiting syndrome   . Cyclical vomiting   . Dysphagia   . GERD (gastroesophageal reflux disease)    controlled  . History of pneumonia   . History of sepsis   . Hypertension    controlled on meds  . Leukocytosis   . Osteopenia Nov 2015  . Tachycardia   . Thrombocytosis ([place])   . Vitamin D deficiency disease    Family History  Problem Relation Age of Onset  . Cancer Father        prostate  . Heart disease Father   . Heart attack Father   . Hypertension Father   . Stroke Neg Hx   . COPD Neg Hx    Past Surgical History:  Procedure Laterality Date  . CESAREAN SECTION     x 2  . COLONOSCOPY WITH PROPOFOL N/A 01/09/2016   Procedure: COLONOSCOPY WITH PROPOFOL;  Surgeon: Lucilla Lame, MD;  Location: [place] [facility];  Service: Endoscopy;  Laterality: N/A;  . POLYPECTOMY  01/09/2016   Procedure: POLYPECTOMY;  Surgeon: Lucilla Lame, MD;  Location: [facility];  Service: Endoscopy;;   Patient Active Problem List   Diagnosis Date Noted  . Acute diverticulitis of intestine 03/18/2017  . IBS (irritable bowel syndrome) 02/02/2017  . Anxiety 02/02/2017  . Acute pancreatitis 01/13/2017  . Low serum potassium 01/13/2017  . Severe depression ([place]) 05/12/2016  . Special screening for malignant neoplasms, colon   . Benign neoplasm of sigmoid colon   . GERD (gastroesophageal reflux disease) 01/04/2016  . Acute sinusitis 09/01/2015  . Marital stress 07/12/2015  . Breast cancer screening 07/08/2015  . Pain due to dental caries 05/09/2015  . Abdominal pain 04/29/2015  . Hematuria, microscopic 04/26/2015  . Recurrent depressive disorder, current episode moderate ([place]) 04/24/2015  . Hypokalemia 04/24/2015  . Hypertension   . Vitamin D deficiency disease   . Cyclical vomiting   . Moderate COPD (chronic obstructive pulmonary disease) ([place])   . Thrombocytosis ([place])   . Osteopenia 06/03/2014      Prior to Admission medications   Medication Sig Start Date End Date Taking? Authorizing Provider  Glycopyrrolate-Formoterol (BEVESPI AEROSPHERE) 9-4.8 MCG/ACT AERO Inhale 2 puffs into the lungs 2 (two) times daily. 10/16/16  Yes Kathrine Haddock, NP  LORazepam (ATIVAN) 1 MG tablet Take 1 tablet (1 mg total) by mouth daily as needed for anxiety. 02/02/17  Yes Kathrine Haddock, NP **Note De-Identified Umbaugh Obfuscation** metoprolol succinate (TOPROL-XL) 50 MG 24 hr tablet Take 1 tablet (50 mg total) by mouth daily. Take with or immediately following a meal. 10/13/16  Yes Kathrine Haddock, NP  mirtazapine (REMERON) 30 MG tablet Take 1 tablet (30 mg total) by mouth at bedtime. 01/13/17  Yes Kathrine Haddock, NP  Multiple Vitamin (MULTIVITAMIN) tablet Take 1 tablet by mouth daily.   Yes [provider]  omeprazole (PRILOSEC) 40 MG capsule Take 40 mg by mouth daily.   Yes [provider]  ondansetron (ZOFRAN ODT) 4 MG disintegrating tablet Take 1 tablet (4 mg total) by mouth every 8 (eight) hours  as needed for nausea or vomiting. 02/02/17  Yes Kathrine Haddock, NP  fexofenadine-pseudoephedrine (ALLEGRA-D 24) 180-240 MG 24 hr tablet Take 1 tablet by mouth daily.    [provider]  ondansetron (ZOFRAN) 4 MG tablet Take 1 tablet (4 mg total) by mouth every 8 (eight) hours as needed for nausea or vomiting. 02/02/17   Kathrine Haddock, NP  oxyCODONE-acetaminophen (ROXICET) 5-325 MG tablet Take 1 tablet by mouth every 6 (six) hours as needed. Patient not taking: Reported on 02/02/2017 01/10/17   Loney Hering, MD    Allergies Doxycycline and Sulfur    Social History Social History  Substance Use Topics  . Smoking status: Current Every Day Smoker    Packs/day: 1.00    Years: 35.00    Types: Cigarettes    Last attempt to quit: 08/03/2013  . Smokeless tobacco: Never Used  . Alcohol use 1.8 oz/week    3 Glasses of wine per week     Comment: occasional    Review of Systems Patient denies headaches, rhinorrhea, blurry vision, numbness, shortness of breath, chest pain, edema, cough, abdominal pain, nausea, vomiting, diarrhea, dysuria, fevers, rashes or hallucinations unless otherwise stated above in HPI. ____________________________________________   PHYSICAL EXAM:  VITAL SIGNS: Vitals:   03/18/17 1605 03/18/17 1700  BP:  (!) 138/94  Pulse: 90 89  Resp: (!) 8 (!) 24  Temp:    SpO2: 96% 100%    Constitutional: Alert and oriented. Uncomfortable but in no acute distress. Eyes: Conjunctivae are normal.  Head: Atraumatic. Nose: No congestion/rhinnorhea. Mouth/Throat: Mucous membranes are moist.   Neck: No stridor. Painless ROM.  Cardiovascular: Normal rate, regular rhythm. Grossly normal heart sounds.  Good peripheral circulation. Respiratory: Normal respiratory effort.  No retractions. Lungs CTAB. Gastrointestinal: Soft , with diffuse ttp but  No guarding or rebound.  Tympanic to percussion in RUQ. No distention. No abdominal bruits. No CVA tenderness. Genitourinary:    Musculoskeletal: No lower extremity tenderness nor edema.  No joint effusions. Neurologic:  Normal speech and language. No gross focal neurologic deficits are appreciated. No facial droop Skin:  Skin is warm, dry and intact. No rash noted. Psychiatric: anxious and tearful appearing ____________________________________________   LABS (all labs ordered are listed, but only abnormal results are displayed)  Results for orders placed or performed during the hospital encounter of 03/18/17 (from the past 24 hour(s))  Urinalysis, Complete w Microscopic     Status: Abnormal   Collection Time: 03/18/17  1:03 PM  Result Value Ref Range   Color, Urine YELLOW (A) YELLOW   APPearance CLEAR (A) CLEAR   Specific Gravity, Urine >1.046 (H) 1.005 - 1.030   pH 8.0 5.0 - 8.0   Glucose, UA NEGATIVE NEGATIVE mg/dL   Hgb urine dipstick SMALL (A) NEGATIVE   Bilirubin Urine NEGATIVE NEGATIVE   Ketones, ur 20 (A) NEGATIVE mg/dL **Note De-Identified Strawder Obfuscation** Protein, ur NEGATIVE NEGATIVE mg/dL   Nitrite NEGATIVE NEGATIVE   Leukocytes, UA TRACE (A) NEGATIVE   RBC / HPF 6-30 0 - 5 RBC/hpf   WBC, UA 0-5 0 - 5 WBC/hpf   Bacteria, UA NONE SEEN NONE SEEN   Squamous Epithelial / LPF 0-5 (A) NONE SEEN  Lipase, blood     Status: None   Collection Time: 03/18/17  1:04 PM  Result Value Ref Range   Lipase 24 11 - 51 U/L  Comprehensive metabolic panel     Status: Abnormal   Collection Time: 03/18/17  1:04 PM  Result Value Ref Range   Sodium 140 135 - 145 mmol/L   Potassium 3.3 (L) 3.5 - 5.1 mmol/L   Chloride 96 (L) 101 - 111 mmol/L   CO2 30 22 - 32 mmol/L   Glucose, Bld 143 (H) 65 - 99 mg/dL   BUN 20 6 - 20 mg/dL   Creatinine, Ser 0.81 0.44 - 1.00 mg/dL   Calcium 9.6 8.9 - 10.3 mg/dL   Total Protein 8.2 (H) 6.5 - 8.1 g/dL   Albumin 4.4 3.5 - 5.0 g/dL   AST 23 15 - 41 U/L   ALT 12 (L) 14 - 54 U/L   Alkaline Phosphatase 106 38 - 126 U/L   Total Bilirubin 1.9 (H) 0.3 - 1.2 mg/dL   GFR calc non Af Amer >60 >60 mL/min   GFR calc Af Amer  >60 >60 mL/min   Anion gap 14 5 - 15  CBC     Status: Abnormal   Collection Time: 03/18/17  1:04 PM  Result Value Ref Range   WBC 17.3 (H) 3.6 - 11.0 K/uL   RBC 5.36 (H) 3.80 - 5.20 MIL/uL   Hemoglobin 17.4 (H) 12.0 - 16.0 g/dL   HCT 51.1 (H) 35.0 - 47.0 %   MCV 95.4 80.0 - 100.0 fL   MCH 32.5 26.0 - 34.0 pg   MCHC 34.1 32.0 - 36.0 g/dL   RDW 14.4 11.5 - 14.5 %   Platelets 331 150 - 440 K/uL  Type and screen [place]     Status: None   Collection Time: 03/18/17  1:39 PM  Result Value Ref Range   ABO/RH(D) O POS    Antibody Screen NEG    Sample Expiration 03/21/2017    ____________________________________________  EKG My review and personal interpretation at Time: 13:06   Indication: abdominal pain  Rate: 120  Rhythm: sinus tach Axis: normal Other: non specific st changes, likely rate dependent, no stemi, normal intervals ____________________________________________  RADIOLOGY  I personally reviewed all radiographic images ordered to evaluate for the above acute complaints and reviewed radiology reports and findings.  These findings were personally discussed with the patient.  Please see medical record for radiology report.  ____________________________________________   PROCEDURES  Procedure(s) performed:  Procedures    Critical Care performed: no ____________________________________________   INITIAL IMPRESSION / ASSESSMENT AND PLAN / ED COURSE  Pertinent labs & imaging results that were available during my care of the patient were reviewed by me and considered in my medical decision making (see chart for details).  DDX: appendicitis, diverticulitis, colitis, sbo, stone, hernia, uti, pancreatitis, cholecystitis  Yaneli Keithley Peaden is a 56 y.o. who presents to the ED with Diffuse abdominal pain as described above. Patient is uncomfortable appearing with generalized tenderness. Based on her history blood work sent for the above differential shows  no evidence of pancreatitis but does have acute leukocytosis with **Note De-Identified Hagarty Obfuscation** left shift. CT imaging ordered to evaluate for acute surgical process.  Patient provided IV fluids and IV pain meds with antiemetics.  The patient will be placed on continuous pulse oximetry and telemetry for monitoring.  Laboratory evaluation will be sent to evaluate for the above complaints.      Clinical Course as of Mar 18 1734  Thu Mar 18, 2017  1511 Patient with CT evidence of acute sigmoid diverticulitis with probable abscess formation. Will consult general surgery.  [PR]    Clinical Course User Index [PR] Merlyn Lot, MD   ----------------------------------------- 5:34 PM on 03/18/2017 -----------------------------------------  Updated family at bedside regarding the plan of care results of her workup thus far. Patient is being admitted to general surgery for further evaluation and management.  ____________________________________________   FINAL CLINICAL IMPRESSION(S) / ED DIAGNOSES  Final diagnoses:  Diverticulitis of large intestine with abscess without bleeding  Acute abdominal pain      NEW MEDICATIONS STARTED DURING THIS VISIT:  New Prescriptions   No medications on file     Note:  This document was prepared using Dragon voice recognition software and may include unintentional dictation errors.    Merlyn Lot, MD 03/18/17 [phone]

## 2017-03-18 NOTE — ED Triage Notes (Addendum)
**Note De-Identified Laurent Obfuscation** Pt to ED [place]  POV with c/o generalized abd pain that started x3days ago with n/v/d. Pt A&Ox4, HR 110-115

## 2017-03-18 NOTE — ED Notes (Signed)
**Note De-Identified Zwart Obfuscation** Patient ambulatory to restroom with steady gait noted. Specimen collected and sent to lab

## 2017-03-18 NOTE — ED Notes (Signed)
**Note De-Identified Colan Obfuscation** Pt up to bathroom with one assist at this time, pt denies pain. VS stbale

## 2017-03-18 NOTE — H&P (Signed)
**Note De-Identified [place] [facility]** SURGICAL HISTORY & PHYSICAL (cpt [phone])  HISTORY OF PRESENT ILLNESS (HPI):  56 y.o. female presented to [facility] ED today with worsening LLQ > diffuse lower abdominal pain and N/V x 3 days (1 - 2 episodes non-bloody emesis per day), along with loose BM's. Patient denies any prior similar episodes, though in June developed acute pancreatitis of unclear etiology without gallstones or more than 3 glasses of wine per week (mostly on Saturdays). Patient otherwise also denies any fever/chills, CP, or SOB and denies needing to stop for or developing CP or SOB when walking up/down a flight of stairs or walking more than 1 - 2 blocks.  PAST MEDICAL HISTORY (PMH):  Past Medical History:  Diagnosis Date  . Anemia   . Anxiety   . CHF (congestive heart failure) ([place])    while hospitalized with pneumonia  . Chronic pain syndrome   . COPD (chronic obstructive pulmonary disease) ([place])    while hospitalized  . Cyclic vomiting syndrome   . Cyclical vomiting   . Dysphagia   . GERD (gastroesophageal reflux disease)    controlled  . History of pneumonia   . History of sepsis   . Hypertension    controlled on meds  . Leukocytosis   . Osteopenia Nov 2015  . Tachycardia   . Thrombocytosis ([place])   . Vitamin D deficiency disease     Reviewed. Otherwise negative.   PAST SURGICAL HISTORY ([place]):  Past Surgical History:  Procedure Laterality Date  . CESAREAN SECTION     x 2  . COLONOSCOPY WITH PROPOFOL N/A 01/09/2016   Procedure: COLONOSCOPY WITH PROPOFOL;  Surgeon: Lucilla Lame, MD;  Location: [facility];  Service: Endoscopy;  Laterality: N/A;  . POLYPECTOMY  01/09/2016   Procedure: POLYPECTOMY;  Surgeon: Lucilla Lame, MD;  Location: [facility];  Service: Endoscopy;;    Reviewed. Otherwise negative.   MEDICATIONS:  Prior to Admission medications   Medication Sig Start Date End Date Taking? Authorizing Provider  Glycopyrrolate-Formoterol (BEVESPI AEROSPHERE) 9-4.8 MCG/ACT AERO Inhale 2  puffs into the lungs 2 (two) times daily. 10/16/16  Yes Kathrine Haddock, NP  LORazepam (ATIVAN) 1 MG tablet Take 1 tablet (1 mg total) by mouth daily as needed for anxiety. 02/02/17  Yes Kathrine Haddock, NP  metoprolol succinate (TOPROL-XL) 50 MG 24 hr tablet Take 1 tablet (50 mg total) by mouth daily. Take with or immediately following a meal. 10/13/16  Yes Kathrine Haddock, NP  mirtazapine (REMERON) 30 MG tablet Take 1 tablet (30 mg total) by mouth at bedtime. 01/13/17  Yes Kathrine Haddock, NP  Multiple Vitamin (MULTIVITAMIN) tablet Take 1 tablet by mouth daily.   Yes [provider]  omeprazole (PRILOSEC) 40 MG capsule Take 40 mg by mouth daily.   Yes [provider]  ondansetron (ZOFRAN ODT) 4 MG disintegrating tablet Take 1 tablet (4 mg total) by mouth every 8 (eight) hours as needed for nausea or vomiting. 02/02/17  Yes Kathrine Haddock, NP  fexofenadine-pseudoephedrine (ALLEGRA-D 24) 180-240 MG 24 hr tablet Take 1 tablet by mouth daily.    [provider]  ondansetron (ZOFRAN) 4 MG tablet Take 1 tablet (4 mg total) by mouth every 8 (eight) hours as needed for nausea or vomiting. 02/02/17   Kathrine Haddock, NP  oxyCODONE-acetaminophen (ROXICET) 5-325 MG tablet Take 1 tablet by mouth every 6 (six) hours as needed. Patient not taking: Reported on 02/02/2017 01/10/17   Loney Hering, MD     ALLERGIES:  Allergies  Allergen Reactions  . **Note De-Identified [place] [facility]ation** Doxycycline Nausea And Vomiting  . Sulfur Diarrhea and Nausea And Vomiting     SOCIAL HISTORY:  Social History   Social History  . Marital status: Married    Spouse name: N/A  . Number of children: N/A  . Years of education: N/A   Occupational History  . Not on file.   Social History Main Topics  . Smoking status: Current Every Day Smoker    Packs/day: 1.00    Years: 35.00    Types: Cigarettes    Last attempt to quit: 08/03/2013  . Smokeless tobacco: Never Used  . Alcohol use 1.8 oz/week    3 Glasses of wine per week     Comment:  occasional  . Drug use: No  . Sexual activity: Not Currently   Other Topics Concern  . Not on file   Social History Narrative   Separated from husband Fall 2016    The patient currently resides (home / rehab facility / nursing home): Home  The patient normally is (ambulatory / bedbound) : Ambulatory   FAMILY HISTORY:  Family History  Problem Relation Age of Onset  . Cancer Father        prostate  . Heart disease Father   . Heart attack Father   . Hypertension Father   . Stroke Neg Hx   . COPD Neg Hx    Otherwise negative.   REVIEW OF SYSTEMS:  Constitutional: denies any other weight loss, fever, chills, or sweats  Eyes: denies any other vision changes, history of eye injury  ENT: denies sore throat, hearing problems  Respiratory: denies shortness of breath, wheezing  Cardiovascular: denies chest pain, palpitations  Gastrointestinal: abdominal pain, N/V, and bowel function as per HPI  Genitourinary: denies burning with urination or urinary frequency Musculoskeletal: denies any other joint pains or cramps  Skin: Denies any other rashes or skin discolorations  Neurological: denies any other headache, dizziness, weakness  Psychiatric: denies any other depression, anxiety   All other review of systems were otherwise negative.  VITAL SIGNS:  Temp:  [99.6 F (37.6 C)] 99.6 F (37.6 C) (08/16 1300) Pulse Rate:  [84-112] 90 (08/16 1605) Resp:  [8-22] 8 (08/16 1605) BP: (134-150)/(77-104) 134/89 (08/16 1604) SpO2:  [95 %-97 %] 96 % (08/16 1605) Weight:  [93 lb (42.2 kg)] 93 lb (42.2 kg) (08/16 1300)     Height: 4\' 11"  (149.9 cm) Weight: 93 lb (42.2 kg) BMI (Calculated): 18.8   INTAKE/OUTPUT:  This shift: No intake/output data recorded.  Last 2 shifts: @IOLAST2SHIFTS @  PHYSICAL EXAM:  Constitutional:  -- Thin, frail body habitus  -- Awake, alert, and oriented x3  Eyes:  -- Pupils equally round and reactive to light  -- No scleral icterus  Ear, nose, throat:  -- No  jugular venous distension  Pulmonary:  -- No crackles  -- Equal breath sounds bilaterally -- Breathing non-labored at rest Cardiovascular:  -- S1, S2 present  -- No pericardial rubs  Gastrointestinal:  -- Abdomen soft and non-distended with moderate LLQ abdominal tenderness to palpation, no guarding/rebound  -- No abdominal masses appreciated, pulsatile or otherwise  Musculoskeletal and Integumentary:  -- Wounds or skin discoloration: None appreciated -- Extremities: B/L UE and LE FROM, hands and feet warm, no edema  Neurologic:  -- Motor function: Intact and symmetric -- Sensation: Intact and symmetric  Labs:  CBC Latest Ref Rng & Units 03/18/2017 02/02/2017 01/10/2017  WBC 3.6 - 11.0 K/uL 17.3(H) 7.1 13.2(H)  Hemoglobin 12.0 - 16.0 g/dL 17.4(H) **Note De-Identified [place] [facility]uscation** 14.9 15.3  Hematocrit 35.0 - 47.0 % 51.1(H) 45.3 43.7  Platelets 150 - 440 K/uL 331 357 342   CMP Latest Ref Rng & Units 03/18/2017 01/13/2017 01/10/2017  Glucose 65 - 99 mg/dL 143(H) 93 119(H)  BUN 6 - 20 mg/dL 20 6 5(L)  Creatinine 0.44 - 1.00 mg/dL 0.81 0.70 0.60  Sodium 135 - 145 mmol/L 140 139 141  Potassium 3.5 - 5.1 mmol/L 3.3(L) 4.7 2.8(L)  Chloride 101 - 111 mmol/L 96(L) 101 100(L)  CO2 22 - 32 mmol/L 30 23 32  Calcium 8.9 - 10.3 mg/dL 9.6 9.7 9.2  Total Protein 6.5 - 8.1 g/dL 8.2(H) 6.8 7.1  Total Bilirubin 0.3 - 1.2 mg/dL 1.9(H) 0.6 0.8  Alkaline Phos 38 - 126 U/L 106 111 90  AST 15 - 41 U/L 23 23 24   ALT 14 - 54 U/L 12(L) 18 18   Imaging studies:  CT Abdomen and Pelvis with IV Contrast (03/18/2017) The stomach is normal. No pathologic dilatation of the small bowel loops. The proximal and mid colon appear normal. Abnormal wall thickening and inflammation involving the sigmoid colon is identified compatible with acute diverticulitis. Intramural fluid collection is identified measuring 2 cm, image 41 of series 5.   Assessment/Plan: (ICD-10's: K32.20) 56 y.o. female with first episode of acute sigmoid colonic diverticulitis  with intramural abscess, complicated by pertinent comorbidities including HTN, COPD, GERD, tobacco abuse, osteopenia, generalized anxiety disorder, and chronic pain syndrome, along with CHF while in the context of prolonged ICU admission for pneumonia with sepsis (2015).    - NPO, IVF  - pain control prn  - IV antibiotics (Zosyn)  - monitor abdominal exam and bowel function  - medical management of comorbidities  - DVT prophylaxis, ambulation  All of the above findings and recommendations were discussed with the patient, and all of her questions were answered to her expressed satisfaction.  -- Marilynne Drivers Rosana Hoes, MD, [place]: [facility] - Partnering for exceptional care. Office: [phone]

## 2017-03-19 LAB — BASIC METABOLIC PANEL
ANION GAP: 7 (ref 5–15)
BUN: 10 mg/dL (ref 6–20)
CHLORIDE: 103 mmol/L (ref 101–111)
CO2: 27 mmol/L (ref 22–32)
Calcium: 7.7 mg/dL — ABNORMAL LOW (ref 8.9–10.3)
Creatinine, Ser: 0.72 mg/dL (ref 0.44–1.00)
GFR calc non Af Amer: >60 mL/min (ref >60)
GLUCOSE: 116 mg/dL — AB (ref 65–99)
Potassium: 3 mmol/L — ABNORMAL LOW (ref 3.5–5.1)
Sodium: 137 mmol/L (ref 135–145)

## 2017-03-19 LAB — CBC
HEMATOCRIT: 43.8 % (ref 35.0–47.0)
HEMOGLOBIN: 14.9 g/dL (ref 12.0–16.0)
MCH: 33.2 pg (ref 26.0–34.0)
MCHC: 34.1 g/dL (ref 32.0–36.0)
MCV: 97.3 fL (ref 80.0–100.0)
Platelets: 253 10*3/uL (ref 150–440)
RBC: 4.5 MIL/uL (ref 3.80–5.20)
RDW: 14.3 % (ref 11.5–14.5)
WBC: 13 10*3/uL — ABNORMAL HIGH (ref 3.6–11.0)

## 2017-03-19 MED ORDER — PANTOPRAZOLE SODIUM 40 MG PO TBEC
40.0000 mg | DELAYED_RELEASE_TABLET | Freq: Every day | ORAL | Status: DC
  Administered 2017-03-19 – 2017-03-21 (×3): 40 mg via ORAL
  Filled 2017-03-19 (×3): qty 1

## 2017-03-19 MED ORDER — ENOXAPARIN SODIUM 30 MG/0.3ML ~~LOC~~ SOLN
30.0000 mg | SUBCUTANEOUS | Status: DC
  Administered 2017-03-19 – 2017-03-20 (×2): 30 mg via SUBCUTANEOUS
  Filled 2017-03-19 (×2): qty 0.3

## 2017-03-19 MED ORDER — OXYCODONE-ACETAMINOPHEN 5-325 MG PO TABS
1.0000 | ORAL_TABLET | ORAL | Status: DC | PRN
  Administered 2017-03-19 (×2): 2 via ORAL
  Administered 2017-03-20 (×2): 1 via ORAL
  Administered 2017-03-20 (×2): 2 via ORAL
  Administered 2017-03-20: 1 via ORAL
  Administered 2017-03-21 (×2): 2 via ORAL
  Filled 2017-03-19 (×2): qty 2
  Filled 2017-03-19: qty 1
  Filled 2017-03-19: qty 2
  Filled 2017-03-19: qty 1
  Filled 2017-03-19 (×2): qty 2
  Filled 2017-03-19: qty 1
  Filled 2017-03-19: qty 2

## 2017-03-19 NOTE — Progress Notes (Signed)
**Note De-Identified [place] [facility]usc[facility]** Order for enoxaparin 40 mg subQ daily changed to 30 mg daily dose for total body weight < 45 kg. Per protocol.  Lenis Noon, PharmD 03/19/17 2:22 PM

## 2017-03-19 NOTE — Progress Notes (Signed)
**Note De-Identified [place] [facility]** SURGICAL PROGRESS NOTE (cpt [phone])  Hospital Day(s): 1.   Post op day(s):  Marland Kitchen   Interval History: Patient seen and examined, no acute events or new complaints overnight. Patient reports her LLQ - suprapubic pain has improved substantially, and she denies any N/V, fever/chills, CP, or SOB.  Review of Systems:  Constitutional: denies fever, chills  HEENT: denies cough or congestion  Respiratory: denies any shortness of breath  Cardiovascular: denies chest pain or palpitations  Gastrointestinal: abdominal pain, N/V, and bowel function as per interval history Genitourinary: denies burning with urination or urinary frequency Musculoskeletal: denies pain, decreased motor or sensation Integumentary: denies any other rashes or skin discolorations Neurological: denies HA or vision/hearing changes   Vital signs in last 24 hours: [min-max] current  Temp:  [98.1 F (36.7 C)-100 F (37.8 C)] 100 F (37.8 C) (08/17 0451) Pulse Rate:  [80-112] 92 (08/17 0451) Resp:  [8-24] 18 (08/17 0451) BP: (115-150)/(74-104) 121/74 (08/17 0451) SpO2:  [95 %-100 %] 95 % (08/17 0451) Weight:  [93 lb (42.2 kg)] 93 lb (42.2 kg) (08/16 1833)     Height: 4\' 11"  (149.9 cm) Weight: 93 lb (42.2 kg) BMI (Calculated): 18.8   Intake/Output this shift:  Total I/O In: 72 [I.V.:62; IV Piggyback:10] Out: -    Intake/Output last 2 shifts:  @IOLAST2SHIFTS @   Physical Exam:  Constitutional: alert, cooperative and no distress  HENT: normocephalic without obvious abnormality  Eyes: PERRL, EOM's grossly intact and symmetric  Neuro: CN II - XII grossly intact and symmetric without deficit  Respiratory: breathing non-labored at rest  Cardiovascular: regular rate and sinus rhythm  Gastrointestinal: soft and non-distended with mild-/moderate- LLQ and suprapubic abdominal tenderness to palpation, no guarding Musculoskeletal: UE and LE FROM, no edema or wounds, motor and sensation grossly intact, NT   Labs:  CBC Latest  Ref Rng & Units 03/19/2017 03/18/2017 02/02/2017  WBC 3.6 - 11.0 K/uL 13.0(H) 17.3(H) 7.1  Hemoglobin 12.0 - 16.0 g/dL 14.9 17.4(H) 14.9  Hematocrit 35.0 - 47.0 % 43.8 51.1(H) 45.3  Platelets 150 - 440 K/uL 253 331 357   CMP Latest Ref Rng & Units 03/19/2017 03/18/2017 01/13/2017  Glucose 65 - 99 mg/dL 116(H) 143(H) 93  BUN 6 - 20 mg/dL 10 20 6   Creatinine 0.44 - 1.00 mg/dL 0.72 0.81 0.70  Sodium 135 - 145 mmol/L 137 140 139  Potassium 3.5 - 5.1 mmol/L 3.0(L) 3.3(L) 4.7  Chloride 101 - 111 mmol/L 103 96(L) 101  CO2 22 - 32 mmol/L 27 30 23   Calcium 8.9 - 10.3 mg/dL 7.7(L) 9.6 9.7  Total Protein 6.5 - 8.1 g/dL - 8.2(H) 6.8  Total Bilirubin 0.3 - 1.2 mg/dL - 1.9(H) 0.6  Alkaline Phos 38 - 126 U/L - 106 111  AST 15 - 41 U/L - 23 23  ALT 14 - 54 U/L - 12(L) 18   Imaging studies: No new pertinent imaging studies  Assessment/Plan: (ICD-10's: K64.20) 56 y.o. female doing overall much better with decreased leukocytosis, first episode of acute sigmoid colonic diverticulitis with intramural abscess, complicated by pertinent comorbidities including HTN, COPD, GERD, tobacco abuse, osteopenia, generalized anxiety disorder, and chronic pain syndrome, along with CHF while in the context of prolonged ICU admission for pneumonia with sepsis (2015).               - pain control prn             - IV antibiotics (Zosyn)             - **Note De-Identified [place] [facility]bfuscation** clear liquids diet ordered, PO meds             - monitor abdominal exam and bowel function             - medical management of comorbidities             - DVT prophylaxis, ambulation  All of the above findings and recommendations were discussed with the patient, and all of her questions were answered to her expressed satisfaction.  -- Marilynne Drivers Rosana Hoes, MD, [place]: [facility] - Partnering for exceptional care. Office: [phone]

## 2017-03-20 LAB — CBC
HCT: 44.8 % (ref 35.0–47.0)
Hemoglobin: 15.4 g/dL (ref 12.0–16.0)
MCH: 33.6 pg (ref 26.0–34.0)
MCHC: 34.3 g/dL (ref 32.0–36.0)
MCV: 97.9 fL (ref 80.0–100.0)
PLATELETS: 233 10*3/uL (ref 150–440)
RBC: 4.58 MIL/uL (ref 3.80–5.20)
RDW: 14.6 % — AB (ref 11.5–14.5)
WBC: 10.6 10*3/uL (ref 3.6–11.0)

## 2017-03-20 LAB — BASIC METABOLIC PANEL
Anion gap: 5 (ref 5–15)
CALCIUM: 8.5 mg/dL — AB (ref 8.9–10.3)
CHLORIDE: 109 mmol/L (ref 101–111)
CO2: 27 mmol/L (ref 22–32)
CREATININE: 0.46 mg/dL (ref 0.44–1.00)
GFR calc non Af Amer: >60 mL/min (ref >60)
Glucose, Bld: 120 mg/dL — ABNORMAL HIGH (ref 65–99)
Potassium: 3.1 mmol/L — ABNORMAL LOW (ref 3.5–5.1)
Sodium: 141 mmol/L (ref 135–145)

## 2017-03-20 LAB — MAGNESIUM: MAGNESIUM: 1.9 mg/dL (ref 1.7–2.4)

## 2017-03-20 LAB — HIV ANTIBODY (ROUTINE TESTING W REFLEX): HIV Screen 4th Generation wRfx: NONREACTIVE

## 2017-03-20 LAB — PHOSPHORUS: Phosphorus: 2.1 mg/dL — ABNORMAL LOW (ref 2.5–4.6)

## 2017-03-20 MED ORDER — NICOTINE 7 MG/24HR TD PT24
7.0000 mg | MEDICATED_PATCH | Freq: Every day | TRANSDERMAL | Status: DC
  Administered 2017-03-21: 7 mg via TRANSDERMAL
  Filled 2017-03-20: qty 1

## 2017-03-20 NOTE — Progress Notes (Signed)
**Note De-Identified [place] [facility]uscation** SURGICAL PROGRESS NOTE (cpt [phone])  Hospital Day(s): 2.   Post op day(s):  Marland Kitchen   Interval History: Patient seen and examined, no acute events or new complaints overnight. Patient reports complete resolution of her pain, states she feels "great", denies abdominal pain, N/V, fever/chills, CP, or SOB.  Review of Systems:  Constitutional: denies fever, chills  HEENT: denies cough or congestion  Respiratory: denies any shortness of breath  Cardiovascular: denies chest pain or palpitations  Gastrointestinal: abdominal pain, N/V, and bowel function as per interval history Genitourinary: denies burning with urination or urinary frequency Musculoskeletal: denies pain, decreased motor or sensation Integumentary: denies any other rashes or skin discolorations Neurological: denies HA or vision/hearing changes   Vital signs in last 24 hours: [min-max] current  Temp:  [98 F (36.7 C)-98.8 F (37.1 C)] 98 F (36.7 C) (08/18 0437) Pulse Rate:  [71-88] 71 (08/18 0437) Resp:  [18-19] 18 (08/18 0437) BP: (119-133)/(74-81) 133/81 (08/18 0437) SpO2:  [98 %-99 %] 99 % (08/18 0437)     Height: 4\' 11"  (149.9 cm) Weight: 93 lb (42.2 kg) BMI (Calculated): 18.8   Intake/Output this shift:  Total I/O In: 562.5 [I.V.:512.5; IV Piggyback:50] Out: -    Intake/Output last 2 shifts:  @IOLAST2SHIFTS @   Physical Exam:  Constitutional: alert, cooperative and no distress  HENT: normocephalic without obvious abnormality  Eyes: PERRL, EOM's grossly intact and symmetric  Neuro: CN II - XII grossly intact and symmetric without deficit  Respiratory: breathing non-labored at rest  Cardiovascular: regular rate and sinus rhythm  Gastrointestinal: soft, completely non-tender, and non-distended  Musculoskeletal: UE and LE FROM, no edema or wounds, motor and sensation grossly intact, NT   Labs:  CBC Latest Ref Rng & Units 03/20/2017 03/19/2017 03/18/2017  WBC 3.6 - 11.0 K/uL 10.6 13.0(H) 17.3(H)  Hemoglobin 12.0 -  16.0 g/dL 15.4 14.9 17.4(H)  Hematocrit 35.0 - 47.0 % 44.8 43.8 51.1(H)  Platelets 150 - 440 K/uL 233 253 331   CMP Latest Ref Rng & Units 03/20/2017 03/19/2017 03/18/2017  Glucose 65 - 99 mg/dL 120(H) 116(H) 143(H)  BUN 6 - 20 mg/dL <5(L) 10 20  Creatinine 0.44 - 1.00 mg/dL 0.46 0.72 0.81  Sodium 135 - 145 mmol/L 141 137 140  Potassium 3.5 - 5.1 mmol/L 3.1(L) 3.0(L) 3.3(L)  Chloride 101 - 111 mmol/L 109 103 96(L)  CO2 22 - 32 mmol/L 27 27 30   Calcium 8.9 - 10.3 mg/dL 8.5(L) 7.7(L) 9.6  Total Protein 6.5 - 8.1 g/dL - - 8.2(H)  Total Bilirubin 0.3 - 1.2 mg/dL - - 1.9(H)  Alkaline Phos 38 - 126 U/L - - 106  AST 15 - 41 U/L - - 23  ALT 14 - 54 U/L - - 12(L)   Imaging studies: No new pertinent imaging studies   Assessment/Plan: (ICD-10's: K57.20) 56 y.o.femalecontinuing to do well with normalization of her leukocytosis and resolution of her abdominal pain, first episode of acute sigmoid colonic diverticulitis with intramural abscess, complicated by pertinent comorbidities including HTN, COPD, GERD, tobacco abuse, osteopenia, generalized anxiety disorder, and chronic pain syndrome, along with CHF while in the context of prolonged ICU admission for pneumonia with sepsis (2015).   - pain control prn - IV antibiotics (Zosyn) - advanced to soft diet, PO meds - monitor abdominal exam and bowel function  - anticipate discharge tomorrow after one more day of IV antibiotics - medical management of comorbidities - DVT prophylaxis, ambulation  All of the above findings and recommendations were discussed with the patient **Note De-Identified [place] [facility]ation** and patient's RN, and all of patient's questions were answered to her expressed satisfaction.  -- Marilynne Drivers Rosana Hoes, MD, [place]: [facility] - Partnering for exceptional care. Office: [phone]

## 2017-03-21 LAB — CBC
HCT: 43.3 % (ref 35.0–47.0)
Hemoglobin: 15 g/dL (ref 12.0–16.0)
MCH: 33.6 pg (ref 26.0–34.0)
MCHC: 34.8 g/dL (ref 32.0–36.0)
MCV: 96.6 fL (ref 80.0–100.0)
Platelets: 262 10*3/uL (ref 150–440)
RBC: 4.48 MIL/uL (ref 3.80–5.20)
RDW: 14.2 % (ref 11.5–14.5)
WBC: 7 10*3/uL (ref 3.6–11.0)

## 2017-03-21 LAB — BASIC METABOLIC PANEL WITH GFR
Anion gap: 6 (ref 5–15)
BUN: <5 mg/dL — ABNORMAL LOW (ref 6–20)
CO2: 26 mmol/L (ref 22–32)
Calcium: 8.6 mg/dL — ABNORMAL LOW (ref 8.9–10.3)
Chloride: 107 mmol/L (ref 101–111)
Creatinine, Ser: 0.62 mg/dL (ref 0.44–1.00)
GFR calc Af Amer: >60 mL/min
GFR calc non Af Amer: >60 mL/min
Glucose, Bld: 103 mg/dL — ABNORMAL HIGH (ref 65–99)
Potassium: 3.2 mmol/L — ABNORMAL LOW (ref 3.5–5.1)
Sodium: 139 mmol/L (ref 135–145)

## 2017-03-21 MED ORDER — AMOXICILLIN-POT CLAVULANATE 875-125 MG PO TABS: 1.0000 | ORAL_TABLET | Freq: Two times a day (BID) | ORAL | 0 refills | Status: AC

## 2017-03-21 NOTE — Progress Notes (Signed)
**Note De-Identified [place] [facility]ation** Patient discharge teaching given, including activity, diet, follow-up appoints, and medications. Medications were returned back to pt from pharmacy. Patient verbalized understanding of all discharge instructions. IV access was d/c'd. Vitals are stable. Skin is intact except as charted in most recent assessments. Pt to be escorted out by RN, to be driven home by family.  Haley [name] [employer]

## 2017-03-25 NOTE — Discharge Summary (Signed)
**Note De-Identified [place] [facility]** Physician Discharge Summary  Patient ID: Haley [name] MRN: [id number] DOB/AGE: 1960-08-07 56 y.o.  Admit date: 03/18/2017 Discharge date: 03/25/2017  Admission Diagnoses:  Discharge Diagnoses:  Active Problems:   Acute diverticulitis of intestine   Discharged Condition: good  Hospital Course: 56 year old Female presented to [facility] ED with acute onset of severe worsening abdominal pain, for which workup revealed acute sigmoid colonic diverticulitis with intramural abscess. Patient was made NPO and hydrated with initiation of IV antibiotics. Patient's pain rather quickly resolved, and patient was able to tolerate advancement of her diet. Following an appropriate duration of IV antibiotics, antibiotics were changed to by mouth, and discharge planning was initiated with appropriate antibiotics, follow-up, and discharge instructions.  Consults: None  Significant Diagnostic Studies: radiology: CT scan: acute sigmoid colonic diverticultis with intramural abscess  Treatments: IV hydration and antibiotics: Zosyn  Discharge Exam: Blood pressure 138/75, pulse 76, temperature 98 F (36.7 C), temperature source Oral, resp. rate 17, height 4\' 11"  (1.499 m), weight 93 lb (42.2 kg), SpO2 97 %. General appearance: alert, cooperative and no distress GI: soft, non-tender; bowel sounds normal; no masses,  no organomegaly  Disposition: 01-Home or Self Care   Allergies as of 03/21/2017      Reactions   Doxycycline Nausea And Vomiting   Sulfur Diarrhea, Nausea And Vomiting      Medication List    STOP taking these medications   ondansetron 4 MG tablet Commonly known as:  ZOFRAN     TAKE these medications   amoxicillin-clavulanate 875-125 MG tablet Commonly known as:  AUGMENTIN Take 1 tablet by mouth 2 (two) times daily.   fexofenadine-pseudoephedrine 180-240 MG 24 hr tablet Commonly known as:  ALLEGRA-D 24 Take 1 tablet by mouth daily.   Glycopyrrolate-Formoterol 9-4.8 MCG/ACT  Aero Commonly known as:  BEVESPI AEROSPHERE Inhale 2 puffs into the lungs 2 (two) times daily.   LORazepam 1 MG tablet Commonly known as:  ATIVAN Take 1 tablet (1 mg total) by mouth daily as needed for anxiety.   metoprolol succinate 50 MG 24 hr tablet Commonly known as:  TOPROL-XL Take 1 tablet (50 mg total) by mouth daily. Take with or immediately following a meal.   mirtazapine 30 MG tablet Commonly known as:  REMERON Take 1 tablet (30 mg total) by mouth at bedtime.   multivitamin tablet Take 1 tablet by mouth daily.   omeprazole 40 MG capsule Commonly known as:  PRILOSEC Take 40 mg by mouth daily.   ondansetron 4 MG disintegrating tablet Commonly known as:  ZOFRAN ODT Take 1 tablet (4 mg total) by mouth every 8 (eight) hours as needed for nausea or vomiting.   oxyCODONE-acetaminophen 5-325 MG tablet Commonly known as:  ROXICET Take 1 tablet by mouth every 6 (six) hours as needed.            Discharge Care Instructions        Start     Ordered   03/21/17 0000  amoxicillin-clavulanate (AUGMENTIN) 875-125 MG tablet  2 times daily     03/21/17 1044     Follow-up [facility]. Schedule an appointment as soon as possible for a visit in 2 week(s).   Specialty:  General Surgery Contact information: [address] [place]          Signed: Vickie Epley 03/25/2017, 6:47 PM

## 2017-03-29 DIAGNOSIS — K5720 Diverticulitis of large intestine with perforation and abscess without bleeding: Secondary | ICD-10-CM

## 2017-04-08 ENCOUNTER — Ambulatory Visit: Payer: BLUE CROSS/BLUE SHIELD | Admitting: Surgery

## 2017-05-07 ENCOUNTER — Other Ambulatory Visit: Payer: Self-pay | Admitting: Unknown Physician Specialty

## 2017-05-10 ENCOUNTER — Other Ambulatory Visit: Payer: Self-pay | Admitting: Unknown Physician Specialty

## 2017-05-10 NOTE — Telephone Encounter (Signed)
**Note De-Identified [place] [facility]bfuscation** Patient needs refill on her Lorazepam sent to [facility]  Thanks

## 2017-05-26 ENCOUNTER — Encounter: Payer: Self-pay | Admitting: Emergency Medicine

## 2017-05-26 ENCOUNTER — Emergency Department: Payer: Self-pay

## 2017-05-26 ENCOUNTER — Emergency Department
Admission: EM | Admit: 2017-05-26 | Discharge: 2017-05-26 | Disposition: A | Discharge status: Home / Self Care | Payer: Self-pay | Attending: Emergency Medicine | Admitting: Emergency Medicine

## 2017-05-26 DIAGNOSIS — G8929 Other chronic pain: Secondary | ICD-10-CM | POA: Insufficient documentation

## 2017-05-26 DIAGNOSIS — K5732 Diverticulitis of large intestine without perforation or abscess without bleeding: Secondary | ICD-10-CM | POA: Insufficient documentation

## 2017-05-26 DIAGNOSIS — J449 Chronic obstructive pulmonary disease, unspecified: Secondary | ICD-10-CM | POA: Insufficient documentation

## 2017-05-26 DIAGNOSIS — K5792 Diverticulitis of intestine, part unspecified, without perforation or abscess without bleeding: Secondary | ICD-10-CM

## 2017-05-26 DIAGNOSIS — I110 Hypertensive heart disease with heart failure: Secondary | ICD-10-CM | POA: Insufficient documentation

## 2017-05-26 DIAGNOSIS — F17210 Nicotine dependence, cigarettes, uncomplicated: Secondary | ICD-10-CM | POA: Insufficient documentation

## 2017-05-26 DIAGNOSIS — Z79899 Other long term (current) drug therapy: Secondary | ICD-10-CM | POA: Insufficient documentation

## 2017-05-26 DIAGNOSIS — I509 Heart failure, unspecified: Secondary | ICD-10-CM | POA: Insufficient documentation

## 2017-05-26 LAB — URINALYSIS, COMPLETE (UACMP) WITH MICROSCOPIC
BACTERIA UA: NONE SEEN
BILIRUBIN URINE: NEGATIVE
Glucose, UA: NEGATIVE mg/dL
Ketones, ur: NEGATIVE mg/dL
LEUKOCYTES UA: NEGATIVE
NITRITE: NEGATIVE
PROTEIN: NEGATIVE mg/dL
Specific Gravity, Urine: 1.001 — ABNORMAL LOW (ref 1.005–1.030)
Squamous Epithelial / LPF: NONE SEEN
pH: 7 (ref 5.0–8.0)

## 2017-05-26 LAB — CBC
HEMATOCRIT: 45.5 % (ref 35.0–47.0)
HEMOGLOBIN: 15.5 g/dL (ref 12.0–16.0)
MCH: 33.1 pg (ref 26.0–34.0)
MCHC: 34.1 g/dL (ref 32.0–36.0)
MCV: 97.2 fL (ref 80.0–100.0)
Platelets: 320 10*3/uL (ref 150–440)
RBC: 4.68 MIL/uL (ref 3.80–5.20)
RDW: 15 % — ABNORMAL HIGH (ref 11.5–14.5)
WBC: 11.2 10*3/uL — AB (ref 3.6–11.0)

## 2017-05-26 LAB — COMPREHENSIVE METABOLIC PANEL
ALBUMIN: 3.8 g/dL (ref 3.5–5.0)
ALT: 17 U/L (ref 14–54)
ANION GAP: 9 (ref 5–15)
AST: 21 U/L (ref 15–41)
Alkaline Phosphatase: 74 U/L (ref 38–126)
BILIRUBIN TOTAL: 0.9 mg/dL (ref 0.3–1.2)
BUN: <5 mg/dL — ABNORMAL LOW (ref 6–20)
CO2: 23 mmol/L (ref 22–32)
Calcium: 9.3 mg/dL (ref 8.9–10.3)
Chloride: 106 mmol/L (ref 101–111)
Creatinine, Ser: 0.57 mg/dL (ref 0.44–1.00)
GFR calc Af Amer: >60 mL/min (ref >60)
GLUCOSE: 106 mg/dL — AB (ref 65–99)
POTASSIUM: 3.6 mmol/L (ref 3.5–5.1)
Sodium: 138 mmol/L (ref 135–145)
TOTAL PROTEIN: 7.5 g/dL (ref 6.5–8.1)

## 2017-05-26 LAB — LIPASE, BLOOD: Lipase: 21 U/L (ref 11–51)

## 2017-05-26 MED ORDER — HYDROCODONE-ACETAMINOPHEN 5-325 MG PO TABS: 1.0000 | ORAL_TABLET | Freq: Four times a day (QID) | ORAL | 0 refills | Status: DC | PRN

## 2017-05-26 MED ORDER — SODIUM CHLORIDE 0.9 % IV BOLUS (SEPSIS)
500.0000 mL | INTRAVENOUS | Status: AC
  Administered 2017-05-26: 500 mL via INTRAVENOUS

## 2017-05-26 MED ORDER — ONDANSETRON HCL 4 MG/2ML IJ SOLN
INTRAMUSCULAR | Status: AC
  Filled 2017-05-26: qty 2

## 2017-05-26 MED ORDER — ONDANSETRON HCL 4 MG/2ML IJ SOLN
4.0000 mg | Freq: Once | INTRAMUSCULAR | Status: AC
  Administered 2017-05-26: 4 mg via INTRAVENOUS

## 2017-05-26 MED ORDER — DOCUSATE SODIUM 100 MG PO CAPS: ORAL_CAPSULE | ORAL | 0 refills | Status: DC

## 2017-05-26 MED ORDER — IOPAMIDOL (ISOVUE-300) INJECTION 61%
75.0000 mL | Freq: Once | INTRAVENOUS | Status: AC | PRN
  Administered 2017-05-26: 75 mL via INTRAVENOUS
  Filled 2017-05-26: qty 75

## 2017-05-26 MED ORDER — MORPHINE SULFATE (PF) 4 MG/ML IV SOLN
INTRAVENOUS | Status: AC
  Filled 2017-05-26: qty 1

## 2017-05-26 MED ORDER — MORPHINE SULFATE (PF) 4 MG/ML IV SOLN
4.0000 mg | Freq: Once | INTRAVENOUS | Status: AC
  Administered 2017-05-26: 4 mg via INTRAVENOUS

## 2017-05-26 MED ORDER — IOPAMIDOL (ISOVUE-300) INJECTION 61%
30.0000 mL | Freq: Once | INTRAVENOUS | Status: AC
Start: 2017-05-26 — End: 2017-05-26
  Administered 2017-05-26: 30 mL via ORAL
  Filled 2017-05-26: qty 30

## 2017-05-26 MED ORDER — ONDANSETRON 4 MG PO TBDP
4.0000 mg | ORAL_TABLET | Freq: Once | ORAL | Status: AC | PRN
  Administered 2017-05-26: 4 mg via ORAL
  Filled 2017-05-26: qty 1

## 2017-05-26 MED ORDER — AMOXICILLIN-POT CLAVULANATE 875-125 MG PO TABS: 1.0000 | ORAL_TABLET | Freq: Three times a day (TID) | ORAL | 0 refills | Status: AC

## 2017-05-26 MED ORDER — ONDANSETRON 4 MG PO TBDP: ORAL_TABLET | ORAL | 0 refills | Status: DC

## 2017-05-26 NOTE — ED Notes (Signed)
**Note De-Identified [place] [facility]uscation** Patient reports LLQ abdominal pain since Sunday. History of diverticulitis with similar symptoms. Reports N/V/D. Denies fever.

## 2017-05-26 NOTE — ED Provider Notes (Signed)
**Note De-Identified Fontanella Obfuscation** [facility] Emergency Department Provider Note  ____________________________________________   First MD Initiated Contact with Patient 05/26/17 1706     (approximate)  I have reviewed the triage vital signs and the nursing notes.   HISTORY  Chief Complaint Abdominal Pain    HPI Haley [name] is a 56 y.o. female who presents for evaluation of about 4 days of left lower quadrant pain.  She states it started gradually and got worse and was severe yesterday but is a little bit better today, only moderate.  Her pain is worse with moving around and seems to be worse with eating so she has not had much to eat or drink over the last 1-2 days.  She she states she feels like the pain is sharp and stabbing but also there is a pressure on the inside "like something is pushing".  Her history is notable for acute diverticulitis with intramural abscess which occurred about 2 months ago.  She was admitted and treated with antibiotics only, no percutaneous drain or surgery.  Improved rapidly and was discharged within a couple of days and completed an outpatient course of antibiotics.  She denies fever/chills, chest pain, shortness of breath, nausea, vomiting, diarrhea, and dysuria, although she states that the act of urinating causes increased pain in her lower abdomen.  Past Medical History:  Diagnosis Date  . Abdominal pain 04/29/2015  . Acute diverticulitis of intestine 03/18/2017  . Acute pancreatitis 01/13/2017  . Acute sinusitis 09/01/2015  . Anemia   . Anxiety   . Benign neoplasm of sigmoid colon   . Breast cancer screening 07/08/2015  . CHF (congestive heart failure) ([place])    while hospitalized with pneumonia  . Chronic pain syndrome   . COPD (chronic obstructive pulmonary disease) ([place])    while hospitalized  . Cyclic vomiting syndrome   . Cyclical vomiting   . Diverticulitis of large intestine with abscess without bleeding   . Dysphagia   . GERD  (gastroesophageal reflux disease)    controlled  . Hematuria, microscopic 04/26/2015   Urine Sept 2016; rechecked still present; refer to urologist; hx of "polyps" in bladder 25 years ago (1991-ish)   . History of pneumonia   . History of sepsis   . Hypertension    controlled on meds  . Hypokalemia 04/24/2015  . IBS (irritable bowel syndrome) 02/02/2017  . Leukocytosis   . Low serum potassium 01/13/2017  . Marital stress 07/12/2015   Separated from husband Fall 2016   . Moderate COPD (chronic obstructive pulmonary disease) ([place])   . Osteopenia Nov 2015  . Pain due to dental caries 05/09/2015  . Recurrent depressive disorder, current episode moderate ([place]) 04/24/2015  . Severe depression ([place]) 05/12/2016  . Special screening for malignant neoplasms, colon   . Tachycardia   . Thrombocytosis ([place])   . Vitamin D deficiency disease     Patient Active Problem List   Diagnosis Date Noted  . Diverticulitis of large intestine with abscess without bleeding   . Acute diverticulitis of intestine 03/18/2017  . IBS (irritable bowel syndrome) 02/02/2017  . Anxiety 02/02/2017  . Acute pancreatitis 01/13/2017  . Low serum potassium 01/13/2017  . Severe depression ([place]) 05/12/2016  . Special screening for malignant neoplasms, colon   . Benign neoplasm of sigmoid colon   . GERD (gastroesophageal reflux disease) 01/04/2016  . Acute sinusitis 09/01/2015  . Marital stress 07/12/2015  . Breast cancer screening 07/08/2015  . Pain due to dental caries 05/09/2015  . **Note De-Identified Falzon Obfuscation** Abdominal pain 04/29/2015  . Hematuria, microscopic 04/26/2015  . Recurrent depressive disorder, current episode moderate ([place]) 04/24/2015  . Hypokalemia 04/24/2015  . Hypertension   . Vitamin D deficiency disease   . Cyclical vomiting   . Moderate COPD (chronic obstructive pulmonary disease) ([place])   . Thrombocytosis ([place])   . Osteopenia 06/03/2014    Past Surgical History:  Procedure Laterality Date  . CESAREAN SECTION     x 2  .  COLONOSCOPY WITH PROPOFOL N/A 01/09/2016   Procedure: COLONOSCOPY WITH PROPOFOL;  Surgeon: Lucilla Lame, MD;  Location: [place];  Service: Endoscopy;  Laterality: N/A;  . POLYPECTOMY  01/09/2016   Procedure: POLYPECTOMY;  Surgeon: Lucilla Lame, MD;  Location: [place];  Service: Endoscopy;;    Prior to Admission medications   Medication Sig Start Date End Date Taking? Authorizing Provider  amoxicillin-clavulanate (AUGMENTIN) 875-125 MG tablet Take 1 tablet by mouth every 8 (eight) hours. 05/26/17 06/05/17  Hinda Kehr, MD  docusate sodium (COLACE) 100 MG capsule Take 1 tablet once or twice daily as needed for constipation while taking narcotic pain medicine 05/26/17   Hinda Kehr, MD  fexofenadine-pseudoephedrine (ALLEGRA-D 24) 180-240 MG 24 hr tablet Take 1 tablet by mouth daily.    [provider]  Glycopyrrolate-Formoterol (BEVESPI AEROSPHERE) 9-4.8 MCG/ACT AERO Inhale 2 puffs into the lungs 2 (two) times daily. 10/16/16   Kathrine Haddock, NP  HYDROcodone-acetaminophen (NORCO/VICODIN) 5-325 MG tablet Take 1-2 tablets by mouth every 6 (six) hours as needed for moderate pain. 05/26/17   Hinda Kehr, MD  LORazepam (ATIVAN) 1 MG tablet TAKE 1 TABLET BY MOUTH ONCE DAILY AS NEEDED FOR ANXIETY 05/10/17   Kathrine Haddock, NP  metoprolol succinate (TOPROL-XL) 50 MG 24 hr tablet Take 1 tablet (50 mg total) by mouth daily. Take with or immediately following a meal. 10/13/16   Kathrine Haddock, NP  mirtazapine (REMERON) 30 MG tablet Take 1 tablet (30 mg total) by mouth at bedtime. 01/13/17   Kathrine Haddock, NP  Multiple Vitamin (MULTIVITAMIN) tablet Take 1 tablet by mouth daily.    [provider]  omeprazole (PRILOSEC) 40 MG capsule Take 40 mg by mouth daily.    [provider]  ondansetron (ZOFRAN ODT) 4 MG disintegrating tablet Allow 1-2 tablets to dissolve in your mouth every 8 hours as needed for nausea/vomiting 05/26/17   Hinda Kehr, MD     Allergies Doxycycline and Sulfur  Family History  Problem Relation Age of Onset  . Cancer Father        prostate  . Heart disease Father   . Heart attack Father   . Hypertension Father   . Stroke Neg Hx   . COPD Neg Hx     Social History Social History  Substance Use Topics  . Smoking status: Current Every Day Smoker    Packs/day: 1.00    Years: 35.00    Types: Cigarettes    Last attempt to quit: 08/03/2013  . Smokeless tobacco: Never Used  . Alcohol use 1.8 oz/week    3 Glasses of wine per week     Comment: occasional    Review of Systems Constitutional: No fever/chills Eyes: No visual changes. ENT: No sore throat. Cardiovascular: Denies chest pain. Respiratory: Denies shortness of breath. Gastrointestinal: Waxing and waning but constant left lower quadrant abdominal pain over the last 4 days Genitourinary: Negative for dysuria. Musculoskeletal: Negative for neck pain.  Negative for back pain. Integumentary: Negative for rash. Neurological: Negative for headaches, focal weakness **Note De-Identified [place]ler Obfuscation** or numbness.   ____________________________________________   PHYSICAL EXAM:  VITAL SIGNS: ED Triage Vitals  Enc Vitals Group     BP 05/26/17 1328 115/73     Pulse Rate 05/26/17 1328 96     Resp 05/26/17 1328 18     Temp 05/26/17 1328 98.7 F (37.1 C)     Temp Source 05/26/17 1328 Oral     SpO2 05/26/17 1328 98 %     Weight 05/26/17 1329 41.7 kg (92 lb)     Height 05/26/17 1329 1.499 m (4\' 11" )     Head Circumference --      Peak Flow --      Pain Score 05/26/17 1328 6     Pain Loc --      Pain Edu? --      Excl. in [place]? --     Constitutional: Alert and oriented. Well appearing and in no acute distress although she does appear uncomfortable Eyes: Conjunctivae are normal.  Head: Atraumatic. Nose: No congestion/rhinnorhea. Mouth/Throat: Mucous membranes are moist. Neck: No stridor.  No meningeal signs.   Cardiovascular: Normal rate, regular rhythm. Good peripheral  circulation. Grossly normal heart sounds. Respiratory: Normal respiratory effort.  No retractions. Lungs CTAB. Gastrointestinal: Soft with moderate tenderness to palpation of the left lower quadrant and suprapubic region of the abdomen.  No tenderness in the right lower quadrant or upper abdomen.  No rebound/guarding, exam is not consistent with peritonitis Musculoskeletal: No lower extremity tenderness nor edema. No gross deformities of extremities. Neurologic:  Normal speech and language. No gross focal neurologic deficits are appreciated.  Skin:  Skin is warm, dry and intact. No rash noted. Psychiatric: Mood and affect are normal. Speech and behavior are normal.  ____________________________________________   LABS (all labs ordered are listed, but only abnormal results are displayed)  Labs Reviewed  COMPREHENSIVE METABOLIC PANEL - Abnormal; Notable for the following:       Result Value   Glucose, Bld 106 (*)    BUN <5 (*)    All other components within normal limits  CBC - Abnormal; Notable for the following:    WBC 11.2 (*)    RDW 15.0 (*)    All other components within normal limits  URINALYSIS, COMPLETE (UACMP) WITH MICROSCOPIC - Abnormal; Notable for the following:    Color, Urine STRAW (*)    APPearance CLEAR (*)    Specific Gravity, Urine 1.001 (*)    Hgb urine dipstick SMALL (*)    All other components within normal limits  LIPASE, BLOOD   ____________________________________________  EKG  None - EKG not ordered by ED physician ____________________________________________  RADIOLOGY   Ct Abdomen Pelvis W Contrast  Result Date: 05/26/2017 CLINICAL DATA:  Left lower quadrant pain. History of diverticulitis. EXAM: CT ABDOMEN AND PELVIS WITH CONTRAST TECHNIQUE: Multidetector CT imaging of the abdomen and pelvis was performed using the standard protocol following bolus administration of intravenous contrast. CONTRAST:  21mL ISOVUE-300 IOPAMIDOL (ISOVUE-300) INJECTION  61% COMPARISON:  03/18/2017 FINDINGS: Lower chest: No acute abnormality. Hepatobiliary: No focal hepatic abnormality. Gallbladder unremarkable. Pancreas: No focal abnormality or ductal dilatation. Spleen: No focal abnormality.  Normal size. Adrenals/Urinary Tract: No adrenal abnormality. No focal renal abnormality. No stones or hydronephrosis. Urinary bladder is unremarkable. Stomach/Bowel: There is sigmoid diverticulosis. Diffuse wall thickening noted throughout the sigmoid colon with surrounding inflammatory stranding. This likely reflects diverticulitis although colitis cannot be excluded given the length of sigmoid colon involvement. No evidence of bowel obstruction. Stomach and small bowel **Note De-Identified Wellborn Obfuscation** decompressed, grossly unremarkable. Vascular/Lymphatic: Diffuse aortic and iliac calcifications. No evidence of aneurysm or adenopathy. Reproductive: Uterus and adnexa unremarkable.  No mass. Other: No free fluid or free air. Musculoskeletal: No acute bony abnormality. IMPRESSION: Sigmoid diverticulosis. Wall thickening within a rather long segment of sigmoid colon with surrounding inflammatory change. This does involve the same area of the sigmoid colon as on prior study. Findings most compatible with diverticulitis. Given the length of sigmoid colon and walled, infectious colitis is possible with felt less likely. Electronically Signed   By: Rolm Baptise M.D.   On: 05/26/2017 19:00    ____________________________________________   PROCEDURES  Critical Care performed: No   Procedure(s) performed:   Procedures   ____________________________________________   INITIAL IMPRESSION / ASSESSMENT AND PLAN / ED COURSE  As part of my medical decision making, I reviewed the following data within the [facility] notes reviewed and incorporated, Labs reviewed , Old chart reviewed and Notes from prior ED visits    Vital signs are notable for a heart rate that is borderline tachycardia but  she is afebrile.  CMP is within normal limits and she has only very slightly elevated white blood cell count.  Urinalysis unremarkable and the lipase is negative. Differential diagnosis includes, but is not limited to, ovarian cyst, ovarian torsion, acute appendicitis, diverticulitis, urinary tract infection/pyelonephritis, endometriosis, bowel obstruction, colitis, renal colic, gastroenteritis, hernia, etc. however my main concern for her is that she may have an unresolved and now worsening intramural abscess from her prior diverticular disease.  I will evaluate with a CT scan of the abdomen and pelvis.  She understands and agrees with the plan.  I offered medication for pain and/or nausea, but she declines at this time.    Clinical Course as of May 26 1937  Wed May 26, 2017  1935 CT scan is consistent with diverticulitis in the same general area that was infected before, but this time there is no evidence of abscess nor perforation.  Vital signs remained stable.  I discussed the results with the patient and she is very happy with outpatient antibiotics and follow-up with Dr. Rosana Hoes.  I gave my usual and customary return precautions. CT Abdomen Pelvis W Contrast [CF]    Clinical Course User Index [CF] Hinda Kehr, MD    ____________________________________________  FINAL CLINICAL IMPRESSION(S) / ED DIAGNOSES  Final diagnoses:  Diverticulitis     MEDICATIONS GIVEN DURING THIS VISIT:  Medications  ondansetron (ZOFRAN-ODT) disintegrating tablet 4 mg (4 mg Oral Given 05/26/17 1335)  sodium chloride 0.9 % bolus 500 mL (0 mLs Intravenous Stopped 05/26/17 1815)  iopamidol (ISOVUE-300) 61 % injection 30 mL (30 mLs Oral Contrast Given 05/26/17 1731)  morphine 4 MG/ML injection 4 mg (4 mg Intravenous Given 05/26/17 1747)  ondansetron (ZOFRAN) injection 4 mg (4 mg Intravenous Given 05/26/17 1746)  iopamidol (ISOVUE-300) 61 % injection 75 mL (75 mLs Intravenous Contrast Given 05/26/17 1846)      NEW OUTPATIENT MEDICATIONS STARTED DURING THIS VISIT:  New Prescriptions   AMOXICILLIN-CLAVULANATE (AUGMENTIN) 875-125 MG TABLET    Take 1 tablet by mouth every 8 (eight) hours.   DOCUSATE SODIUM (COLACE) 100 MG CAPSULE    Take 1 tablet once or twice daily as needed for constipation while taking narcotic pain medicine   HYDROCODONE-ACETAMINOPHEN (NORCO/VICODIN) 5-325 MG TABLET    Take 1-2 tablets by mouth every 6 (six) hours as needed for moderate pain.   ONDANSETRON (ZOFRAN ODT) 4 MG DISINTEGRATING TABLET **Note De-Identified [place] Obfuscation** Allow 1-2 tablets to dissolve in your mouth every 8 hours as needed for nausea/vomiting    Modified Medications   No medications on file    Discontinued Medications   ONDANSETRON (ZOFRAN ODT) 4 MG DISINTEGRATING TABLET    Take 1 tablet (4 mg total) by mouth every 8 (eight) hours as needed for nausea or vomiting.   OXYCODONE-ACETAMINOPHEN (ROXICET) 5-325 MG TABLET    Take 1 tablet by mouth every 6 (six) hours as needed.     Note:  This document was prepared using Dragon voice recognition software and may include unintentional dictation errors.    Hinda Kehr, MD 05/26/17 [phone]

## 2017-05-26 NOTE — ED Notes (Signed)
**Note De-Identified Gassner Obfuscation** Patient given ice chips per MD

## 2017-05-26 NOTE — Discharge Instructions (Signed)
**Note De-identified Asa Obfuscation** We believe your symptoms are caused by diverticulitis.  Most of the time this condition (please read through the included information) can be cured with outpatient antibiotics.  Please take the full course of prescribed medication(s) and follow up with the doctors recommended above.  Return to the ED if your abdominal pain worsens or fails to improve, you develop bloody vomiting, bloody diarrhea, you are unable to tolerate fluids due to vomiting, fever greater than 101, or other symptoms that concern you.  Take Norco as prescribed for severe pain. Do not drink alcohol, drive or participate in any other potentially dangerous activities while taking this medication as it may make you sleepy. Do not take this medication with any other sedating medications, either prescription or over-the-counter. If you were prescribed Percocet or Vicodin, do not take these with acetaminophen (Tylenol) as it is already contained within these medications.   This medication is an opiate (or narcotic) pain medication and can be habit forming.  Use it as little as possible to achieve adequate pain control.  Do not use or use it with extreme caution if you have a history of opiate abuse or dependence.  If you are on a pain contract with your primary care doctor or a pain specialist, be sure to let them know you were prescribed this medication today from the [facility].  This medication is intended for your use only - do not give any to anyone else and keep it in a secure place where nobody else, especially children, have access to it.  It will also cause or worsen constipation, so you may want to consider taking an over-the-counter stool softener while you are taking this medication.  

## 2017-05-26 NOTE — ED Notes (Signed)
**Note De-Identified Kisiel Obfuscation** Pt arrives [facility] c/o abdominal pain. Hx of diverticulitis. PT VSS with EMS.

## 2017-05-26 NOTE — ED Triage Notes (Signed)
**Note De-Identified [place] Obfuscation** Pt from home [facility] with lower left quadrant pain x 3 days. States she has hx of diverticulitis and thinks this feels the same. She does indicate that she has pain upon urination "but not like bladder infection pain; more like something is pushing on it from the inside." Pt states she has been hurting for several days. NAD Noted.

## 2017-05-28 ENCOUNTER — Ambulatory Visit (INDEPENDENT_AMBULATORY_CARE_PROVIDER_SITE_OTHER): Payer: Self-pay | Admitting: Unknown Physician Specialty

## 2017-05-28 ENCOUNTER — Encounter: Payer: Self-pay | Admitting: Unknown Physician Specialty

## 2017-05-28 DIAGNOSIS — K859 Acute pancreatitis without necrosis or infection, unspecified: Secondary | ICD-10-CM

## 2017-05-28 DIAGNOSIS — F331 Major depressive disorder, recurrent, moderate: Secondary | ICD-10-CM

## 2017-05-28 DIAGNOSIS — K5720 Diverticulitis of large intestine with perforation and abscess without bleeding: Secondary | ICD-10-CM

## 2017-05-28 MED ORDER — MIRTAZAPINE 30 MG PO TABS: 30.0000 mg | ORAL_TABLET | Freq: Every day | ORAL | 6 refills | Status: DC

## 2017-05-28 NOTE — Progress Notes (Signed)
**Note De-Identified Murin Obfuscation** BP 136/86   Pulse 76   Temp 99 F (37.2 C)   Ht 4' 11.6" (1.514 m)   Wt 90 lb 12.8 oz (41.2 kg)   SpO2 96%   BMI 17.97 kg/m    Subjective:    Patient ID: Haley [name], female    DOB: 1961/03/18, 56 y.o.   MRN: [id number]  HPI: Haley [name] is a 56 y.o. female  Chief Complaint  Patient presents with  . Annual Exam    Pt is here for physical but tearful.  Pt states she was in the ER on Wednesday for diverticulitis.  This is improving.  Taking Augmentin and on diet changes.    Depression Having a hard time dialing with her dad's illness who has bone cancer.  Pt stopped her Mirtazapine last month as she decided "it wasn't doing anything."  She is not sleeping well and having nightmares.   Depression screen [facility] 2/9 05/28/2017 02/02/2017 11/13/2016 10/13/2016 05/12/2016  Decreased Interest 3 2 0 3 3  Down, Depressed, Hopeless 3 1 0 3 3  PHQ - 2 Score 6 3 0 6 6  Altered sleeping 2 2 - 3 3  Tired, decreased energy 3 1 - 3 3  Change in appetite 2 1 - 3 3  Feeling bad or failure about yourself  0 0 - 1 3  Trouble concentrating 2 1 - 1 3  Moving slowly or fidgety/restless 0 0 - 0 2  Suicidal thoughts 0 0 - 0 1  PHQ-9 Score 15 8 - 17 24  Difficult doing work/chores - - - - -   Relevant past medical, surgical, family and social history reviewed and updated as indicated. Interim medical history since our last visit reviewed. Allergies and medications reviewed and updated.  Review of Systems  Per HPI unless specifically indicated above     Objective:    BP 136/86   Pulse 76   Temp 99 F (37.2 C)   Ht 4' 11.6" (1.514 m)   Wt 90 lb 12.8 oz (41.2 kg)   SpO2 96%   BMI 17.97 kg/m   Wt Readings from Last 3 Encounters:  05/28/17 90 lb 12.8 oz (41.2 kg)  05/26/17 92 lb (41.7 kg)  03/18/17 93 lb (42.2 kg)    Physical Exam  Constitutional: She is oriented to person, place, and time. She appears well-developed and well-nourished. No distress.  HENT:  Head: Normocephalic and  atraumatic.  Eyes: Conjunctivae and lids are normal. Right eye exhibits no discharge. Left eye exhibits no discharge. No scleral icterus.  Neck: Normal range of motion. Neck supple. No JVD present. Carotid bruit is not present.  Cardiovascular: Normal rate, regular rhythm and normal heart sounds.   Pulmonary/Chest: Effort normal and breath sounds normal.  Abdominal: Normal appearance. There is no splenomegaly or hepatomegaly.  Musculoskeletal: Normal range of motion.  Neurological: She is alert and oriented to person, place, and time.  Skin: Skin is warm, dry and intact. No rash noted. No pallor.  Psychiatric: She has a normal mood and affect. Her behavior is normal. Judgment and thought content normal.    Results for orders placed or performed during the hospital encounter of 05/26/17  Lipase, blood  Result Value Ref Range   Lipase 21 11 - 51 U/L  Comprehensive metabolic panel  Result Value Ref Range   Sodium 138 135 - 145 mmol/L   Potassium 3.6 3.5 - 5.1 mmol/L   Chloride 106 101 - 111 mmol/L **Note De-Identified Xiao Obfuscation** CO2 23 22 - 32 mmol/L   Glucose, Bld 106 (H) 65 - 99 mg/dL   BUN <5 (L) 6 - 20 mg/dL   Creatinine, Ser 0.57 0.44 - 1.00 mg/dL   Calcium 9.3 8.9 - 10.3 mg/dL   Total Protein 7.5 6.5 - 8.1 g/dL   Albumin 3.8 3.5 - 5.0 g/dL   AST 21 15 - 41 U/L   ALT 17 14 - 54 U/L   Alkaline Phosphatase 74 38 - 126 U/L   Total Bilirubin 0.9 0.3 - 1.2 mg/dL   GFR calc non Af Amer >60 >60 mL/min   GFR calc Af Amer >60 >60 mL/min   Anion gap 9 5 - 15  CBC  Result Value Ref Range   WBC 11.2 (H) 3.6 - 11.0 K/uL   RBC 4.68 3.80 - 5.20 MIL/uL   Hemoglobin 15.5 12.0 - 16.0 g/dL   HCT 45.5 35.0 - 47.0 %   MCV 97.2 80.0 - 100.0 fL   MCH 33.1 26.0 - 34.0 pg   MCHC 34.1 32.0 - 36.0 g/dL   RDW 15.0 (H) 11.5 - 14.5 %   Platelets 320 150 - 440 K/uL  Urinalysis, Complete w Microscopic  Result Value Ref Range   Color, Urine STRAW (A) YELLOW   APPearance CLEAR (A) CLEAR   Specific Gravity, Urine 1.001 (L) 1.005  - 1.030   pH 7.0 5.0 - 8.0   Glucose, UA NEGATIVE NEGATIVE mg/dL   Hgb urine dipstick SMALL (A) NEGATIVE   Bilirubin Urine NEGATIVE NEGATIVE   Ketones, ur NEGATIVE NEGATIVE mg/dL   Protein, ur NEGATIVE NEGATIVE mg/dL   Nitrite NEGATIVE NEGATIVE   Leukocytes, UA NEGATIVE NEGATIVE   RBC / HPF 0-5 0 - 5 RBC/hpf   WBC, UA 0-5 0 - 5 WBC/hpf   Bacteria, UA NONE SEEN NONE SEEN   Squamous Epithelial / LPF NONE SEEN NONE SEEN      Assessment & Plan:   Problem List Items Addressed This Visit      Unprioritized   Diverticulitis of large intestine with abscess without bleeding    This is improving.  Continue present antibiotics      Recurrent depressive disorder, current episode moderate (HCC)    Worsening problem.  Restart Mirtazepine at 30 mg.  Willl re-evaluate in 1 month.  Encouraged counseling.  Resources given          Follow up plan: Return in about 4 weeks (around 06/25/2017) for Reschedule PE.

## 2017-05-28 NOTE — Assessment & Plan Note (Addendum)
**Note De-Identified [place] Obfuscation** Worsening problem.  Restart Mirtazepine at 30 mg.  Willl re-evaluate in 1 month.  Encouraged counseling.  Resources given

## 2017-05-28 NOTE — Patient Instructions (Signed)
**Note De-Identified [place] Obfuscation** Counseling: [facility] [phone]

## 2017-05-28 NOTE — Assessment & Plan Note (Signed)
**Note De-Identified [place] Obfuscation** This is improving.  Continue present antibiotics

## 2017-06-02 ENCOUNTER — Ambulatory Visit: Payer: Self-pay | Admitting: Unknown Physician Specialty

## 2017-06-11 ENCOUNTER — Ambulatory Visit (INDEPENDENT_AMBULATORY_CARE_PROVIDER_SITE_OTHER): Payer: Self-pay | Admitting: Unknown Physician Specialty

## 2017-06-11 ENCOUNTER — Encounter: Payer: Self-pay | Admitting: Unknown Physician Specialty

## 2017-06-11 VITALS — BP 106/70 | HR 77 | Temp 98.7°F | Wt 89.8 lb

## 2017-06-11 DIAGNOSIS — R197 Diarrhea, unspecified: Secondary | ICD-10-CM

## 2017-06-11 DIAGNOSIS — R1032 Left lower quadrant pain: Secondary | ICD-10-CM

## 2017-06-11 DIAGNOSIS — Z23 Encounter for immunization: Secondary | ICD-10-CM

## 2017-06-11 DIAGNOSIS — K56600 Partial intestinal obstruction, unspecified as to cause: Secondary | ICD-10-CM

## 2017-06-11 LAB — CBC WITH DIFFERENTIAL/PLATELET
HEMATOCRIT: 48.6 % — AB (ref 34.0–46.6)
HEMOGLOBIN: 16.6 g/dL — AB (ref 11.1–15.9)
LYMPHS ABS: 1.5 10*3/uL (ref 0.7–3.1)
LYMPHS: 13 %
MCH: 32.7 pg (ref 26.6–33.0)
MCHC: 34.2 g/dL (ref 31.5–35.7)
MCV: 96 fL (ref 79–97)
MID (Absolute): 0.4 10*3/uL (ref 0.1–1.6)
MID: 3 %
Neutrophils Absolute: 10.3 10*3/uL — ABNORMAL HIGH (ref 1.4–7.0)
Neutrophils: 84 %
Platelets: 477 10*3/uL — ABNORMAL HIGH (ref 150–379)
RBC: 5.07 x10E6/uL (ref 3.77–5.28)
RDW: 14.4 % (ref 12.3–15.4)
WBC: 12.2 10*3/uL — AB (ref 3.4–10.8)

## 2017-06-11 LAB — UA/M W/RFLX CULTURE, ROUTINE
Bilirubin, UA: NEGATIVE
Glucose, UA: NEGATIVE
Ketones, UA: NEGATIVE
LEUKOCYTES UA: NEGATIVE
Nitrite, UA: NEGATIVE
PH UA: 7 (ref 5.0–7.5)
Specific Gravity, UA: 1.015 (ref 1.005–1.030)
UUROB: 0.2 mg/dL (ref 0.2–1.0)

## 2017-06-11 LAB — MICROSCOPIC EXAMINATION: BACTERIA UA: NONE SEEN

## 2017-06-11 MED ORDER — METRONIDAZOLE 500 MG PO TABS: 500.0000 mg | ORAL_TABLET | Freq: Three times a day (TID) | ORAL | 0 refills | Status: DC

## 2017-06-11 NOTE — Progress Notes (Signed)
**Note De-Identified [place] Obfuscation** BP 106/70   Pulse 77   Temp 98.7 F (37.1 C) (Oral)   Wt 89 lb 12.8 oz (40.7 kg)   SpO2 96%   BMI 17.77 kg/m    Subjective:    Patient ID: Haley [name], female    DOB: Dec 03, 1960, 56 y.o.   MRN: [id number]  HPI: Haley [name] is a 56 y.o. female  Chief Complaint  Patient presents with  . Diverticulitis    pt states she thinks she is still having issues with diverticulitis, states she thinks she may need another anitbiotic   Pt has been to the ER twice in the last 6 months for diverticulitis.  Last time was 10/24 and given Augmentin which she finished taking.  CT scan suggest diverticulitis.  She is still having left lower abdominal pain.  Having bladder pain.  Bowel movements are watery.  Strains for both urine and stool.  Unable to eat much.  Drinking 2 Dannon milk shakes/day  Relevant past medical, surgical, family and social history reviewed and updated as indicated. Interim medical history since our last visit reviewed. Allergies and medications reviewed and updated.  Review of Systems  Per HPI unless specifically indicated above     Objective:    BP 106/70   Pulse 77   Temp 98.7 F (37.1 C) (Oral)   Wt 89 lb 12.8 oz (40.7 kg)   SpO2 96%   BMI 17.77 kg/m   Wt Readings from Last 3 Encounters:  06/11/17 89 lb 12.8 oz (40.7 kg)  05/28/17 90 lb 12.8 oz (41.2 kg)  05/26/17 92 lb (41.7 kg)    Physical Exam  Constitutional: She is oriented to person, place, and time. She appears well-developed and well-nourished. No distress.  HENT:  Head: Normocephalic and atraumatic.  Eyes: Conjunctivae and lids are normal. Right eye exhibits no discharge. Left eye exhibits no discharge. No scleral icterus.  Neck: Normal range of motion. Neck supple. No JVD present. Carotid bruit is not present.  Cardiovascular: Normal rate, regular rhythm and normal heart sounds.  Pulmonary/Chest: Effort normal and breath sounds normal.  Abdominal: Normal appearance. There is no splenomegaly or  hepatomegaly. There is tenderness in the left lower quadrant. There is no rebound.  Musculoskeletal: Normal range of motion.  Neurological: She is alert and oriented to person, place, and time.  Skin: Skin is warm, dry and intact. No rash noted. No pallor.  Psychiatric: She has a normal mood and affect. Her behavior is normal. Judgment and thought content normal.   WBC is 12.2  Results for orders placed or performed during the hospital encounter of 05/26/17  Lipase, blood  Result Value Ref Range   Lipase 21 11 - 51 U/L  Comprehensive metabolic panel  Result Value Ref Range   Sodium 138 135 - 145 mmol/L   Potassium 3.6 3.5 - 5.1 mmol/L   Chloride 106 101 - 111 mmol/L   CO2 23 22 - 32 mmol/L   Glucose, Bld 106 (H) 65 - 99 mg/dL   BUN <5 (L) 6 - 20 mg/dL   Creatinine, Ser 0.57 0.44 - 1.00 mg/dL   Calcium 9.3 8.9 - 10.3 mg/dL   Total Protein 7.5 6.5 - 8.1 g/dL   Albumin 3.8 3.5 - 5.0 g/dL   AST 21 15 - 41 U/L   ALT 17 14 - 54 U/L   Alkaline Phosphatase 74 38 - 126 U/L   Total Bilirubin 0.9 0.3 - 1.2 mg/dL   GFR calc non Af **Note De-Identified [place] Obfuscation** Amer >60 >60 mL/min   GFR calc Af Amer >60 >60 mL/min   Anion gap 9 5 - 15  CBC  Result Value Ref Range   WBC 11.2 (H) 3.6 - 11.0 K/uL   RBC 4.68 3.80 - 5.20 MIL/uL   Hemoglobin 15.5 12.0 - 16.0 g/dL   HCT 45.5 35.0 - 47.0 %   MCV 97.2 80.0 - 100.0 fL   MCH 33.1 26.0 - 34.0 pg   MCHC 34.1 32.0 - 36.0 g/dL   RDW 15.0 (H) 11.5 - 14.5 %   Platelets 320 150 - 440 K/uL  Urinalysis, Complete w Microscopic  Result Value Ref Range   Color, Urine STRAW (A) YELLOW   APPearance CLEAR (A) CLEAR   Specific Gravity, Urine 1.001 (L) 1.005 - 1.030   pH 7.0 5.0 - 8.0   Glucose, UA NEGATIVE NEGATIVE mg/dL   Hgb urine dipstick SMALL (A) NEGATIVE   Bilirubin Urine NEGATIVE NEGATIVE   Ketones, ur NEGATIVE NEGATIVE mg/dL   Protein, ur NEGATIVE NEGATIVE mg/dL   Nitrite NEGATIVE NEGATIVE   Leukocytes, UA NEGATIVE NEGATIVE   RBC / HPF 0-5 0 - 5 RBC/hpf   WBC, UA 0-5 0 -  5 WBC/hpf   Bacteria, UA NONE SEEN NONE SEEN   Squamous Epithelial / LPF NONE SEEN NONE SEEN      Assessment & Plan:   Problem List Items Addressed This Visit      Unprioritized   Abdominal pain   Relevant Orders   CBC with Differential/Platelet   UA/M w/rflx Culture, Routine   Ambulatory referral to General Surgery   Stool C-Diff Toxin Assay    Other Visit Diagnoses    Need for influenza vaccination    -  Primary   Relevant Orders   Flu Vaccine QUAD 36+ mos IM (Completed)   Partial intestinal obstruction, unspecified cause ([place])       Concern for bowel obstruction.  Will refer to general surgery ASAP   Relevant Orders   Ambulatory referral to General Surgery   Diarrhea, unspecified type       Liquid.  Suspect partial bowel obstruction.        Labs for C diff.  Add Flagyl 500 mg BID.  Concern with obstruction.  Refer to surgery.  2 CTs in 6 months and will defer further imagine to surgery  Follow up plan: F/u with surgery

## 2017-06-11 NOTE — Patient Instructions (Signed)
**Note De-identified [place] Obfuscation** Influenza (Flu) Vaccine (Inactivated or Recombinant): What You Need to Know 1. Why get vaccinated? Influenza ("flu") is a contagious disease that spreads around the [place] every year, usually between October and May. Flu is caused by influenza viruses, and is spread mainly by coughing, sneezing, and close contact. Anyone can get flu. Flu strikes suddenly and can last several days. Symptoms vary by age, but can include:  fever/chills  sore throat  muscle aches  fatigue  cough  headache  runny or stuffy nose Flu can also lead to pneumonia and blood infections, and cause diarrhea and seizures in children. If you have a medical condition, such as heart or lung disease, flu can make it worse. Flu is more dangerous for some people. Infants and young children, people 65 years of age and older, pregnant women, and people with certain health conditions or a weakened immune system are at greatest risk. Each year thousands of people in the [place] die from flu, and many more are hospitalized. Flu vaccine can:  keep you from getting flu,  make flu less severe if you do get it, and  keep you from spreading flu to your family and other people. 2. Inactivated and recombinant flu vaccines A dose of flu vaccine is recommended every flu season. Children 6 months through 8 years of age may need two doses during the same flu season. Everyone else needs only one dose each flu season. Some inactivated flu vaccines contain a very small amount of a mercury-based preservative called thimerosal. Studies have not shown thimerosal in vaccines to be harmful, but flu vaccines that do not contain thimerosal are available. There is no live flu virus in flu shots. They cannot cause the flu. There are many flu viruses, and they are always changing. Each year a new flu vaccine is made to protect against three or four viruses that are likely to cause disease in the upcoming flu season. But even when the  vaccine doesn't exactly match these viruses, it may still provide some protection. Flu vaccine cannot prevent:  flu that is caused by a virus not covered by the vaccine, or  illnesses that look like flu but are not. It takes about 2 weeks for protection to develop after vaccination, and protection lasts through the flu season. 3. Some people should not get this vaccine Tell the person who is giving you the vaccine:  If you have any severe, life-threatening allergies. If you ever had a life-threatening allergic reaction after a dose of flu vaccine, or have a severe allergy to any part of this vaccine, you may be advised not to get vaccinated. Most, but not all, types of flu vaccine contain a small amount of egg protein.  If you ever had Guillain-Barr Syndrome (also called GBS). Some people with a history of GBS should not get this vaccine. This should be discussed with your doctor.  If you are not feeling well. It is usually okay to get flu vaccine when you have a mild illness, but you might be asked to come back when you feel better. 4. Risks of a vaccine reaction With any medicine, including vaccines, there is a chance of reactions. These are usually mild and go away on their own, but serious reactions are also possible. Most people who get a flu shot do not have any problems with it. Minor problems following a flu shot include:  soreness, redness, or swelling where the shot was given  hoarseness  sore, red or itchy  **Note De-identified [place] Obfuscation** eyes  cough  fever  aches  headache  itching  fatigue If these problems occur, they usually begin soon after the shot and last 1 or 2 days. More serious problems following a flu shot can include the following:  There may be a small increased risk of Guillain-Barre Syndrome (GBS) after inactivated flu vaccine. This risk has been estimated at 1 or 2 additional cases per million people vaccinated. This is much lower than the risk of severe complications from flu,  which can be prevented by flu vaccine.  Young children who get the flu shot along with pneumococcal vaccine (PCV13) and/or DTaP vaccine at the same time might be slightly more likely to have a seizure caused by fever. Ask your doctor for more information. Tell your doctor if a child who is getting flu vaccine has ever had a seizure. Problems that could happen after any injected vaccine:  People sometimes faint after a medical procedure, including vaccination. Sitting or lying down for about 15 minutes can help prevent fainting, and injuries caused by a fall. Tell your doctor if you feel dizzy, or have vision changes or ringing in the ears.  Some people get severe pain in the shoulder and have difficulty moving the arm where a shot was given. This happens very rarely.  Any medication can cause a severe allergic reaction. Such reactions from a vaccine are very rare, estimated at about 1 in a million doses, and would happen within a few minutes to a few hours after the vaccination. As with any medicine, there is a very remote chance of a vaccine causing a serious injury or death. The safety of vaccines is always being monitored. For more information, visit: www.cdc.gov/vaccinesafety/ 5. What if there is a serious reaction? What should I look for? Look for anything that concerns you, such as signs of a severe allergic reaction, very high fever, or unusual behavior. Signs of a severe allergic reaction can include hives, swelling of the face and throat, difficulty breathing, a fast heartbeat, dizziness, and weakness. These would start a few minutes to a few hours after the vaccination. What should I do?  If you think it is a severe allergic reaction or other emergency that can't wait, call 9-1-1 and get the person to the nearest hospital. Otherwise, call your doctor.  Reactions should be reported to the Vaccine Adverse Event Reporting System (VAERS). Your doctor should file this report, or you can do  it yourself through the VAERS web site at www.vaers.hhs.gov, or by calling 1-800-822-7967.  VAERS does not give medical advice. 6. The National Vaccine Injury Compensation Program The National Vaccine Injury Compensation Program (VICP) is a federal program that was created to compensate people who may have been injured by certain vaccines. Persons who believe they may have been injured by a vaccine can learn about the program and about filing a claim by calling 1-800-338-2382 or visiting the VICP website at www.hrsa.gov/vaccinecompensation. There is a time limit to file a claim for compensation. 7. How can I learn more?  Ask your healthcare provider. He or she can give you the vaccine package insert or suggest other sources of information.  Call your local or state health department.  Contact the Centers for Disease Control and Prevention (CDC):  Call 1-800-232-4636 (1-800-CDC-INFO) or  Visit CDC's website at www.cdc.gov/flu Vaccine Information Statement, Inactivated Influenza Vaccine (03/09/2014) This information is not intended to replace advice given to you by your health care provider. Make sure you discuss any questions you  **Note De-identified [place] Obfuscation** have with your health care provider. Document Released: 05/14/2006 Document Revised: 04/09/2016 Document Reviewed: 04/09/2016 Elsevier Interactive Patient Education  2017 Elsevier Inc.  

## 2017-06-14 ENCOUNTER — Ambulatory Visit (INDEPENDENT_AMBULATORY_CARE_PROVIDER_SITE_OTHER): Payer: PRIVATE HEALTH INSURANCE | Admitting: General Surgery

## 2017-06-14 ENCOUNTER — Encounter: Payer: Self-pay | Admitting: General Surgery

## 2017-06-14 VITALS — BP 120/74 | HR 68 | Temp 99.0°F | Resp 14 | Ht 59.5 in | Wt 96.0 lb

## 2017-06-14 DIAGNOSIS — K5732 Diverticulitis of large intestine without perforation or abscess without bleeding: Secondary | ICD-10-CM

## 2017-06-14 DIAGNOSIS — Z8719 Personal history of other diseases of the digestive system: Secondary | ICD-10-CM | POA: Diagnosis not present

## 2017-06-14 MED ORDER — MELOXICAM 7.5 MG PO TABS: 7.5000 mg | ORAL_TABLET | Freq: Every day | ORAL | 0 refills | Status: DC

## 2017-06-14 NOTE — Progress Notes (Signed)
**Note De-Identified [place] Obfuscation** Patient ID: Haley [name], female   DOB: May 29, 1961, 56 y.o.   MRN: [id number]  Chief Complaint  Patient presents with  . Other    diverticulitis    HPI Haley [name] is a 56 y.o. female here for evaluation of diverticulitis. She is currently having problems with constipation.the patient has a sense that she needs to move her bowels but only a small amount of liquid stool come out. Her last bowel movement was on 06/10/17 and was yellow and watery. She was seen in the ER in August and again in October for diverticulitis. With CT of abdomen pelvis done both times. She was on Flagyl but has finished this. She has been drinking Ensure twice daily. She reports pain in the lower left abdomen with gurgling. She denies any fevers. The patient does report last 6 months episodes of feeling hot and cold, but no clear history of chills. Last year she weighted 98lbs.  She reports problems with her bowels for years, mostly diarrhea, watery followed by mucus.  The patient reports at the time of her diagnosis of pancreatitis in June 2018 she was drinking up to one half bottle of wine per day. Since that diagnosis she is not had any adult beverages.  HPI  Past Medical History:  Diagnosis Date  . Abdominal pain 04/29/2015  . Acute diverticulitis of intestine 03/18/2017  . Acute pancreatitis 01/13/2017  . Acute sinusitis 09/01/2015  . Anemia   . Anxiety   . Benign neoplasm of sigmoid colon   . Breast cancer screening 07/08/2015  . CHF (congestive heart failure) ([place])    while hospitalized with pneumonia  . Chronic pain syndrome   . COPD (chronic obstructive pulmonary disease) ([place])    while hospitalized  . Cyclic vomiting syndrome   . Cyclical vomiting   . Diverticulitis of large intestine with abscess without bleeding   . Dysphagia   . GERD (gastroesophageal reflux disease)    controlled  . Hematuria, microscopic 04/26/2015   Urine Sept 2016; rechecked still present; refer to urologist; hx of "polyps" in  bladder 25 years ago (1991-ish)   . History of pneumonia   . History of sepsis   . Hypertension    controlled on meds  . Hypokalemia 04/24/2015  . IBS (irritable bowel syndrome) 02/02/2017  . Leukocytosis   . Low serum potassium 01/13/2017  . Marital stress 07/12/2015   Separated from husband Fall 2016   . Moderate COPD (chronic obstructive pulmonary disease) ([place])   . Osteopenia Nov 2015  . Pain due to dental caries 05/09/2015  . Recurrent depressive disorder, current episode moderate ([place]) 04/24/2015  . Severe depression ([place]) 05/12/2016  . Special screening for malignant neoplasms, colon   . Tachycardia   . Thrombocytosis ([place])   . Vitamin D deficiency disease     Past Surgical History:  Procedure Laterality Date  . CESAREAN SECTION  09/27/79, 08/29/83   x 2    Family History  Problem Relation Age of Onset  . Cancer Father        prostate  . Heart disease Father   . Heart attack Father   . Hypertension Father   . Stroke Neg Hx   . COPD Neg Hx     Social History Social History   Tobacco Use  . Smoking status: Current Every Day Smoker    Packs/day: 1.00    Years: 35.00    Pack years: 35.00    Types: Cigarettes    Last attempt **Note De-Identified [place] Obfuscation** to quit: 08/03/2013    Years since quitting: 3.8  . Smokeless tobacco: Never Used  Substance Use Topics  . Alcohol use: Yes    Alcohol/week: 1.8 oz    Types: 3 Glasses of wine per week    Comment: occasional  . Drug use: No    Allergies  Allergen Reactions  . Doxycycline Nausea And Vomiting  . Sulfur Diarrhea and Nausea And Vomiting    Current Outpatient Medications  Medication Sig Dispense Refill  . docusate sodium (COLACE) 100 MG capsule Take 1 tablet once or twice daily as needed for constipation while taking narcotic pain medicine 30 capsule 0  . Glycopyrrolate-Formoterol (BEVESPI AEROSPHERE) 9-4.8 MCG/ACT AERO Inhale 2 puffs into the lungs 2 (two) times daily. 3 Inhaler 3  . HYDROcodone-acetaminophen (NORCO/VICODIN) 5-325 MG tablet  Take 1-2 tablets by mouth every 6 (six) hours as needed for moderate pain. 15 tablet 0  . LORazepam (ATIVAN) 1 MG tablet TAKE 1 TABLET BY MOUTH ONCE DAILY AS NEEDED FOR ANXIETY 30 tablet 0  . metoprolol succinate (TOPROL-XL) 50 MG 24 hr tablet Take 1 tablet (50 mg total) by mouth daily. Take with or immediately following a meal. 90 tablet 3  . mirtazapine (REMERON) 30 MG tablet Take 1 tablet (30 mg total) by mouth at bedtime. 30 tablet 6  . Multiple Vitamin (MULTIVITAMIN) tablet Take 1 tablet by mouth daily.    Marland Kitchen omeprazole (PRILOSEC) 40 MG capsule Take 40 mg by mouth daily.    . ondansetron (ZOFRAN ODT) 4 MG disintegrating tablet Allow 1-2 tablets to dissolve in your mouth every 8 hours as needed for nausea/vomiting 30 tablet 0  . meloxicam (MOBIC) 7.5 MG tablet Take 1 tablet (7.5 mg total) daily by mouth. 30 tablet 0   No current facility-administered medications for this visit.     Review of Systems Review of Systems  Constitutional: Negative.   Gastrointestinal: Positive for constipation and nausea. Negative for abdominal distention, abdominal pain, anal bleeding, blood in stool, diarrhea, rectal pain and vomiting.    Blood pressure 120/74, pulse 68, temperature 99 F (37.2 C), resp. rate 14, height 4' 11.5" (1.511 m), weight 96 lb (43.5 kg).  Physical Exam Physical Exam  Constitutional: She is oriented to person, place, and time. She appears well-developed and well-nourished.  Eyes: Conjunctivae are normal. No scleral icterus.  Neck: Neck supple.  Cardiovascular: Normal rate, regular rhythm, normal heart sounds and intact distal pulses.  Pulmonary/Chest: Effort normal and breath sounds normal.  Abdominal: Soft. Normal appearance. There is tenderness in the left lower quadrant.  Lymphadenopathy:    She has no cervical adenopathy.  Neurological: She is alert and oriented to person, place, and time.  Skin: Skin is warm and dry.  Psychiatric: She has a normal mood and affect.     Data Reviewed Colonoscopy dated 06/08/2017performed as a screening exam by Lucilla Lame, M.D. showed 2 polyps in the sigmoid colon. Pathology showed hyperplastic polyps.Multiple diverticuli noted in the sigmoid colon.  CT scan of of the abdomen and pelvis completed 01/10/2017 reviewed: IMPRESSION: 1. Soft tissue stranding and fluid about the pancreatic head and duodenum, favor pancreatitis over duodenitis. Fluid tracks distally into the right lower abdomen. 2. Aortic atherosclerosis. 3. Minimal sigmoid colonic diverticulosis without acute inflammation Ultrasound of the abdomen completed 01/29/2017 for follow-up however diagnosis of pancreatitis was negative for gallstones.  CT scan of the abdomen and pelvis dated 03/18/2017 was reviewed: IMPRESSION: 1. Imaging findings compatible with acute sigmoid diverticulitis. Evidence of intramural abscess **Note De-Identified [place] Obfuscation** formation noted. 2. No significant free fluid, free intraperitoneal air or extraluminal abscess formation 3.  Aortic Atherosclerosis (ICD10-I70.0).  CT scan of the abdomen and pelvis dated 05/26/2017 was reviewed. Sigmoid diverticulosis. Wall thickening within a rather long segment of sigmoid colon with surrounding inflammatory change. This does involve the same area of the sigmoid colon as on prior study. Findings most compatible with diverticulitis. Given the length of sigmoid colon and walled, infectious colitis is possible with felt less likely.  Review of the most current study showed prominence of the proximal small bowel with apparent wall thickening, twice a diameter of the distal bowel.  CBC dated 06/11/2017 showed a white blood cell count 12,200, 84% polys.with a hemoglobin of 16.6, platelet count of 477,000.    Comprehensive metabolic panel dated 76/72/0947 showed normal electrolytes, nonfasting blood sugar 106, creatinine of 0.57, normal liver function studies.ipase normal at 21.  CBC on 03/21/2017 showed a white plus a  count of 7000 with a hemoglobin of 15. Platelet count 262,000.  01/10/2017 serum lipase at the time of her diagnosis of pancreatitis was 83, 3 years earlier had been 309.  Serum lipid panel dated 01/13/2017 showed a cholesterol of 162, triglycerides of 189, HDL 47. Serum triglycerides 3 years earlier 238.  Assessment    Proximal small bowel wall thickening on her most recent CT scan, questionable inflammatory bowel disease.  Thickening of the colon more pronounced between 12-Feb-2023 and 13-Jun-2017, diverticulitis infectious versus inflammatory.  Modest weight loss  Long history of chronic loose stools, likely IBS.     Plan    KUB if she is not going to have her upper GI for the next several days, otherwise will depend on plain films at the time of the upper GI to confirm that she does not have an obstruction, not clinically evident. .  Will schedule Upper GI with SBFT to assess the abnormal proximal small bowel images on her 05/26/2017 CT.Marland Kitchen   Based on today's clinical exam, and in spite of this very low-grade temperature of 99 (baseline temperature is 97.9) and modest elevation of her serum white blood cell count 3 days ago, would not feel obligated to reinstitute antibiotics at this time. We will indeed make use of a trial of anti-inflammatories. The patient's been instructed not make use of aspirin or OTC nti-inflammatories while using meloxicam.  Discussed low residue diet. Trial of Meloxicam 7.5mg  once daily for one month.      We will arrange for a repeat examination in 2 weeks, earlier for symptoms accelerated.  HPI, Physical Exam, Assessment and Plan have been scribed under the direction and in the presence of [name], MD  Haley Living, LPN  I have completed the exam and reviewed the above documentation for accuracy and completeness.  I agree with the above.  Haematologist has been used and any errors in dictation or transcription are  unintentional.  Hervey Ard, M.D., F.A.C.S.   [name] 06/14/2017, 7:46 PM  Patient has been scheduled for an upper GI with SBFT for 06-15-17 at [facility] (arrive at 11:15 am for 11:30 am appointment). Prep: NPO after midnight.   Dominga Ferry, CMA

## 2017-06-14 NOTE — Patient Instructions (Addendum)
**Note De-Identified [place] Obfuscation** Will schedule an upper GI with small bowel follow through.  Stay away from raw fruits and vegetables. Eat a low residue diet. May have chicken and meat. Do not take Advil or Aleve while on the Meloxicam.

## 2017-06-15 ENCOUNTER — Ambulatory Visit: Payer: Self-pay | Attending: General Surgery

## 2017-06-28 ENCOUNTER — Ambulatory Visit (INDEPENDENT_AMBULATORY_CARE_PROVIDER_SITE_OTHER): Payer: PRIVATE HEALTH INSURANCE | Admitting: General Surgery

## 2017-06-28 ENCOUNTER — Encounter: Payer: Self-pay | Admitting: General Surgery

## 2017-06-28 VITALS — BP 138/84 | HR 98 | Resp 13 | Ht 59.0 in | Wt 91.0 lb

## 2017-06-28 DIAGNOSIS — K5732 Diverticulitis of large intestine without perforation or abscess without bleeding: Secondary | ICD-10-CM

## 2017-06-28 NOTE — Patient Instructions (Addendum)
**Note De-Identified [place] Obfuscation** Patient to be schedule Upper GI with SBFT. Stay on the Meloxicam.

## 2017-06-28 NOTE — Progress Notes (Signed)
**Note De-Identified [place] Obfuscation** Patient ID: Haley [name], female   DOB: 1961-03-10, 55 y.o.   MRN: [id number]  Chief Complaint  Patient presents with  . Follow-up    HPI Haley [name] is a 56 y.o. female here today for her two week follow up diverticulitis. Patient states she is still having lots of diarrhea, in the last two days her bowels are more brown than yellow.  No fever or chills. She reports pain in the lower left abdomen with gurgling. Patient states she didn't get the diagnostic imaging done on 06/15/2017 because of insurance issues, now resolved.   HPI  Past Medical History:  Diagnosis Date  . Abdominal pain 04/29/2015  . Acute diverticulitis of intestine 03/18/2017  . Acute pancreatitis 01/13/2017  . Acute sinusitis 09/01/2015  . Anemia   . Anxiety   . Benign neoplasm of sigmoid colon   . Breast cancer screening 07/08/2015  . CHF (congestive heart failure) ([place])    while hospitalized with pneumonia  . Chronic pain syndrome   . COPD (chronic obstructive pulmonary disease) ([place])    while hospitalized  . Cyclic vomiting syndrome   . Cyclical vomiting   . Diverticulitis of large intestine with abscess without bleeding   . Dysphagia   . GERD (gastroesophageal reflux disease)    controlled  . Hematuria, microscopic 04/26/2015   Urine Sept 2016; rechecked still present; refer to urologist; hx of "polyps" in bladder 25 years ago (1991-ish)   . History of pneumonia   . History of sepsis   . Hypertension    controlled on meds  . Hypokalemia 04/24/2015  . IBS (irritable bowel syndrome) 02/02/2017  . Leukocytosis   . Low serum potassium 01/13/2017  . Marital stress 07/12/2015   Separated from husband Fall 2016   . Moderate COPD (chronic obstructive pulmonary disease) ([place])   . Osteopenia Nov 2015  . Pain due to dental caries 05/09/2015  . Recurrent depressive disorder, current episode moderate ([place]) 04/24/2015  . Severe depression ([place]) 05/12/2016  . Special screening for malignant neoplasms, colon   .  Tachycardia   . Thrombocytosis ([place])   . Vitamin D deficiency disease     Past Surgical History:  Procedure Laterality Date  . CESAREAN SECTION  09/27/79, 08/29/83   x 2  . COLONOSCOPY WITH PROPOFOL N/A 01/09/2016   Procedure: COLONOSCOPY WITH PROPOFOL;  Surgeon: Lucilla Lame, MD;  Location: [facility];  Service: Endoscopy;  Laterality: N/A;  . POLYPECTOMY  01/09/2016   Procedure: POLYPECTOMY;  Surgeon: Lucilla Lame, MD;  Location: [facility];  Service: Endoscopy;;    Family History  Problem Relation Age of Onset  . Cancer Father        prostate  . Heart disease Father   . Heart attack Father   . Hypertension Father   . Stroke Neg Hx   . COPD Neg Hx     Social History Social History   Tobacco Use  . Smoking status: Current Every Day Smoker    Packs/day: 1.00    Years: 35.00    Pack years: 35.00    Types: Cigarettes    Last attempt to quit: 08/03/2013    Years since quitting: 3.9  . Smokeless tobacco: Never Used  Substance Use Topics  . Alcohol use: Yes    Alcohol/week: 1.8 oz    Types: 3 Glasses of wine per week    Comment: occasional  . Drug use: No    Allergies  Allergen Reactions  . Doxycycline Nausea **Note De-Identified [place] Obfuscation** And Vomiting  . Sulfur Diarrhea and Nausea And Vomiting    Current Outpatient Medications  Medication Sig Dispense Refill  . docusate sodium (COLACE) 100 MG capsule Take 1 tablet once or twice daily as needed for constipation while taking narcotic pain medicine 30 capsule 0  . Glycopyrrolate-Formoterol (BEVESPI AEROSPHERE) 9-4.8 MCG/ACT AERO Inhale 2 puffs into the lungs 2 (two) times daily. 3 Inhaler 3  . HYDROcodone-acetaminophen (NORCO/VICODIN) 5-325 MG tablet Take 1-2 tablets by mouth every 6 (six) hours as needed for moderate pain. 15 tablet 0  . LORazepam (ATIVAN) 1 MG tablet TAKE 1 TABLET BY MOUTH ONCE DAILY AS NEEDED FOR ANXIETY 30 tablet 0  . meloxicam (MOBIC) 7.5 MG tablet Take 1 tablet (7.5 mg total) daily by mouth. 30 tablet 0  .  metoprolol succinate (TOPROL-XL) 50 MG 24 hr tablet Take 1 tablet (50 mg total) by mouth daily. Take with or immediately following a meal. 90 tablet 3  . mirtazapine (REMERON) 30 MG tablet Take 1 tablet (30 mg total) by mouth at bedtime. 30 tablet 6  . Multiple Vitamin (MULTIVITAMIN) tablet Take 1 tablet by mouth daily.    Marland Kitchen omeprazole (PRILOSEC) 40 MG capsule Take 40 mg by mouth daily.    . ondansetron (ZOFRAN ODT) 4 MG disintegrating tablet Allow 1-2 tablets to dissolve in your mouth every 8 hours as needed for nausea/vomiting 30 tablet 0   No current facility-administered medications for this visit.     Review of Systems Review of Systems  Constitutional: Negative.   Respiratory: Negative.   Cardiovascular: Negative.   Gastrointestinal: Positive for abdominal pain and nausea.    Blood pressure 138/84, pulse 98, resp. rate 13, height 4\' 11"  (1.499 m), weight 91 lb (41.3 kg).  Physical Exam Physical Exam  Constitutional: She is oriented to person, place, and time. She appears well-developed and well-nourished.  Cardiovascular: Normal rate and regular rhythm.  Pulmonary/Chest: Effort normal and breath sounds normal.  Abdominal: Soft. Normal appearance and bowel sounds are normal. There is tenderness (minimal tenderness in the left lower quadrant with deep palpation.).  Neurological: She is alert and oriented to person, place, and time.  Skin: Skin is warm and dry.    Data Reviewed Upper GI not yet completed.   Assessment    Upper GI will be scheduled again help assess for the possibility of small bowel dilatation.     Plan    The patient has not noticed a significant change in her left lower quadrant discomfort, but certainly has not showed any worsening symptoms.She'll continue on her meloxicam at present.    Patient to be schedule Upper GI with SBFT.  Stay on the Meloxicam.   HPI, Physical Exam, Assessment and Plan have been scribed under the direction and in the  presence of Hervey Ard, MD.  Gaspar Cola, CMA  I have completed the exam and reviewed the above documentation for accuracy and completeness.  I agree with the above.  Haematologist has been used and any errors in dictation or transcription are unintentional.  Hervey Ard, M.D., F.A.C.S.   [name] 06/28/2017, 8:19 PM  Patient has been scheduled for an upper GI with SBFT for 07-01-17 at 10:30 am (arrive 10:15 am). Prep: NPO 3 hours prior. The patient is aware of date, time, and instructions. She will be contacted once results are available.   Dominga Ferry, CMA

## 2017-07-01 ENCOUNTER — Ambulatory Visit
Admission: RE | Admit: 2017-07-01 | Discharge: 2017-07-01 | Disposition: A | Discharge status: Home / Self Care | Payer: 59 | Source: Ambulatory Visit | Attending: General Surgery | Admitting: General Surgery

## 2017-07-01 DIAGNOSIS — K5732 Diverticulitis of large intestine without perforation or abscess without bleeding: Secondary | ICD-10-CM | POA: Diagnosis present

## 2017-07-20 ENCOUNTER — Encounter: Payer: Self-pay | Admitting: General Surgery

## 2017-07-20 ENCOUNTER — Ambulatory Visit (INDEPENDENT_AMBULATORY_CARE_PROVIDER_SITE_OTHER): Payer: PRIVATE HEALTH INSURANCE | Admitting: General Surgery

## 2017-07-20 VITALS — BP 132/92 | HR 96 | Resp 14 | Ht 59.0 in | Wt 91.2 lb

## 2017-07-20 DIAGNOSIS — K5732 Diverticulitis of large intestine without perforation or abscess without bleeding: Secondary | ICD-10-CM

## 2017-07-20 MED ORDER — MELOXICAM 7.5 MG PO TABS: 7.5000 mg | ORAL_TABLET | Freq: Every day | ORAL | 1 refills | Status: DC

## 2017-07-20 NOTE — Progress Notes (Signed)
**Note De-Identified [place] Obfuscation** Patient ID: Haley [name], female   DOB: 1961/06/15, 56 y.o.   MRN: [id number]  Chief Complaint  Patient presents with  . Follow-up    HPI Haley [name] is a 56 y.o. female.  Herr for follow up UGI. She states she is about the same, either diarrhea or constipation. Described as 3 days of diarrhea then followed by the constipation. She does feel she has more stress, father has cancer and mother in law that she keeps during the day with dementia.Spicy foods gives her problems. Patient states she has pain in her rectal area when she seats down.    HPI  Past Medical History:  Diagnosis Date  . Abdominal pain 04/29/2015  . Acute diverticulitis of intestine 03/18/2017  . Acute pancreatitis 01/13/2017  . Acute sinusitis 09/01/2015  . Anemia   . Anxiety   . Benign neoplasm of sigmoid colon   . Breast cancer screening 07/08/2015  . CHF (congestive heart failure) ([place])    while hospitalized with pneumonia  . Chronic pain syndrome   . COPD (chronic obstructive pulmonary disease) ([place])    while hospitalized  . Cyclic vomiting syndrome   . Cyclical vomiting   . Diverticulitis of large intestine with abscess without bleeding   . Dysphagia   . GERD (gastroesophageal reflux disease)    controlled  . Hematuria, microscopic 04/26/2015   Urine Sept 2016; rechecked still present; refer to urologist; hx of "polyps" in bladder 25 years ago (1991-ish)   . History of pneumonia   . History of sepsis   . Hypertension    controlled on meds  . Hypokalemia 04/24/2015  . IBS (irritable bowel syndrome) 02/02/2017  . Leukocytosis   . Low serum potassium 01/13/2017  . Marital stress 07/12/2015   Separated from husband Fall 2016   . Moderate COPD (chronic obstructive pulmonary disease) ([place])   . Osteopenia Nov 2015  . Pain due to dental caries 05/09/2015  . Recurrent depressive disorder, current episode moderate ([place]) 04/24/2015  . Severe depression ([place]) 05/12/2016  . Special screening for malignant neoplasms, colon    . Tachycardia   . Thrombocytosis ([place])   . Vitamin D deficiency disease     Past Surgical History:  Procedure Laterality Date  . CESAREAN SECTION  09/27/79, 08/29/83   x 2  . COLONOSCOPY WITH PROPOFOL N/A 01/09/2016   Procedure: COLONOSCOPY WITH PROPOFOL;  Surgeon: Lucilla Lame, MD;  Location: [facility];  Service: Endoscopy;  Laterality: N/A;  . POLYPECTOMY  01/09/2016   Procedure: POLYPECTOMY;  Surgeon: Lucilla Lame, MD;  Location: [facility];  Service: Endoscopy;;    Family History  Problem Relation Age of Onset  . Cancer Father        prostate  . Heart disease Father   . Heart attack Father   . Hypertension Father   . Stroke Neg Hx   . COPD Neg Hx     Social History Social History   Tobacco Use  . Smoking status: Current Every Day Smoker    Packs/day: 1.00    Years: 35.00    Pack years: 35.00    Types: Cigarettes    Last attempt to quit: 08/03/2013    Years since quitting: 3.9  . Smokeless tobacco: Never Used  Substance Use Topics  . Alcohol use: Yes    Alcohol/week: 1.8 oz    Types: 3 Glasses of wine per week    Comment: occasional  . Drug use: No    Allergies **Note De-Identified [place] Obfuscation** Allergen Reactions  . Doxycycline Nausea And Vomiting  . Sulfur Diarrhea and Nausea And Vomiting    Current Outpatient Medications  Medication Sig Dispense Refill  . docusate sodium (COLACE) 100 MG capsule Take 1 tablet once or twice daily as needed for constipation while taking narcotic pain medicine 30 capsule 0  . Glycopyrrolate-Formoterol (BEVESPI AEROSPHERE) 9-4.8 MCG/ACT AERO Inhale 2 puffs into the lungs 2 (two) times daily. 3 Inhaler 3  . HYDROcodone-acetaminophen (NORCO/VICODIN) 5-325 MG tablet Take 1-2 tablets by mouth every 6 (six) hours as needed for moderate pain. 15 tablet 0  . LORazepam (ATIVAN) 1 MG tablet TAKE 1 TABLET BY MOUTH ONCE DAILY AS NEEDED FOR ANXIETY 30 tablet 0  . meloxicam (MOBIC) 7.5 MG tablet Take 1 tablet (7.5 mg total) daily by mouth. 30 tablet 0  .  metoprolol succinate (TOPROL-XL) 50 MG 24 hr tablet Take 1 tablet (50 mg total) by mouth daily. Take with or immediately following a meal. 90 tablet 3  . mirtazapine (REMERON) 30 MG tablet Take 1 tablet (30 mg total) by mouth at bedtime. 30 tablet 6  . Multiple Vitamin (MULTIVITAMIN) tablet Take 1 tablet by mouth daily.    Marland Kitchen omeprazole (PRILOSEC) 40 MG capsule Take 40 mg by mouth daily.    . ondansetron (ZOFRAN ODT) 4 MG disintegrating tablet Allow 1-2 tablets to dissolve in your mouth every 8 hours as needed for nausea/vomiting 30 tablet 0  . meloxicam (MOBIC) 7.5 MG tablet Take 1 tablet (7.5 mg total) by mouth daily. 30 tablet 1   No current facility-administered medications for this visit.     Review of Systems Review of Systems  Constitutional: Negative.   Respiratory: Negative.   Cardiovascular: Negative.   Gastrointestinal: Positive for constipation and diarrhea.    Blood pressure (!) 132/92, pulse 96, resp. rate 14, height 4\' 11"  (1.499 m), weight 91 lb 3.2 oz (41.4 kg).  Physical Exam Physical Exam  Constitutional: She is oriented to person, place, and time. She appears well-developed and well-nourished.  Cardiovascular: Normal rate, regular rhythm and normal heart sounds.  Pulmonary/Chest: Effort normal and breath sounds normal.  Abdominal: Soft. Normal appearance and bowel sounds are normal. There is tenderness in the suprapubic area.    Genitourinary: Rectal exam shows mass (cervix palpable anteriorly). Rectal exam shows anal tone normal.  Genitourinary Comments: nontender on rectal exam.  Neurological: She is alert and oriented to person, place, and time.  Skin: Skin is warm and dry.    Data Reviewed Upper GI showed no abnormality of the proximal small bowel.  Assessment    Diverticulitis, stable.    Plan    The patient had no symptoms prior to this fall. Imaging studies consistently show thickening in the sigmoid colonbut no evidence of cholecystic mass or  fluid. No proximal dilatation. Colonoscopy in 2017 was normal.  While she might do well with elective sigmoid resection, we would both like to avoid this if possible. Her weight is stable and she's not having any severe episodes of pain that would push for urgent intervention.  We'll continue on Mobic at this time, 7.5 mg per day. Someone told the patient she did not take Prilosec for her long-standing reflux. I see no contraindication of this medication.  The importance of smoking cessation for healing in the event surgical intervention was required was discussed in detail.    Patient to start back on Omeprazole and stay on Mobic .Work on stop smoking. The patient will be encouraged to make **Note De-Identified [place] Obfuscation** use of a daily fiber supplement. Call and report in a couple of weeks. . The patient is aware to call back for any questions or concerns.   HPI, Physical Exam, Assessment and Plan have been scribed under the direction and in the presence of [name], MD. Karie Fetch, RN  I have completed the exam and reviewed the above documentation for accuracy and completeness.  I agree with the above.  Haematologist has been used and any errors in dictation or transcription are unintentional.  Hervey Ard, M.D., F.A.C.S.   [name] 07/20/2017, 8:33 PM

## 2017-07-20 NOTE — Patient Instructions (Addendum)
**Note De-Identified [place] Obfuscation** Patient to start back on Omeprazole.Work on stop smoking. The patient will be encouraged to make use of a daily fiber supplement. Call and report in a couple of weeks. The patient is aware to call back for any questions or concerns.

## 2017-09-14 ENCOUNTER — Encounter: Payer: Self-pay | Admitting: Emergency Medicine

## 2017-09-14 ENCOUNTER — Inpatient Hospital Stay
Admission: EM | Admit: 2017-09-14 | Discharge: 2017-09-20 | DRG: 391 | Disposition: A | Discharge status: Home / Self Care | Payer: 59 | Attending: General Surgery | Admitting: General Surgery

## 2017-09-14 ENCOUNTER — Other Ambulatory Visit: Payer: Self-pay

## 2017-09-14 ENCOUNTER — Emergency Department: Payer: 59

## 2017-09-14 ENCOUNTER — Ambulatory Visit (INDEPENDENT_AMBULATORY_CARE_PROVIDER_SITE_OTHER): Payer: 59 | Admitting: Unknown Physician Specialty

## 2017-09-14 ENCOUNTER — Encounter: Payer: Self-pay | Admitting: Unknown Physician Specialty

## 2017-09-14 VITALS — BP 135/87 | HR 123 | Temp 99.5°F | Wt 90.6 lb

## 2017-09-14 DIAGNOSIS — K5792 Diverticulitis of intestine, part unspecified, without perforation or abscess without bleeding: Secondary | ICD-10-CM | POA: Diagnosis not present

## 2017-09-14 DIAGNOSIS — Z8249 Family history of ischemic heart disease and other diseases of the circulatory system: Secondary | ICD-10-CM

## 2017-09-14 DIAGNOSIS — G894 Chronic pain syndrome: Secondary | ICD-10-CM | POA: Diagnosis present

## 2017-09-14 DIAGNOSIS — K219 Gastro-esophageal reflux disease without esophagitis: Secondary | ICD-10-CM | POA: Diagnosis present

## 2017-09-14 DIAGNOSIS — Z681 Body mass index (BMI) 19 or less, adult: Secondary | ICD-10-CM

## 2017-09-14 DIAGNOSIS — F17210 Nicotine dependence, cigarettes, uncomplicated: Secondary | ICD-10-CM | POA: Diagnosis present

## 2017-09-14 DIAGNOSIS — I10 Essential (primary) hypertension: Secondary | ICD-10-CM | POA: Diagnosis present

## 2017-09-14 DIAGNOSIS — J449 Chronic obstructive pulmonary disease, unspecified: Secondary | ICD-10-CM | POA: Diagnosis present

## 2017-09-14 DIAGNOSIS — E43 Unspecified severe protein-calorie malnutrition: Secondary | ICD-10-CM

## 2017-09-14 DIAGNOSIS — R1032 Left lower quadrant pain: Secondary | ICD-10-CM

## 2017-09-14 DIAGNOSIS — K5732 Diverticulitis of large intestine without perforation or abscess without bleeding: Secondary | ICD-10-CM | POA: Diagnosis present

## 2017-09-14 DIAGNOSIS — Z8701 Personal history of pneumonia (recurrent): Secondary | ICD-10-CM

## 2017-09-14 DIAGNOSIS — K5720 Diverticulitis of large intestine with perforation and abscess without bleeding: Secondary | ICD-10-CM | POA: Diagnosis not present

## 2017-09-14 LAB — COMPREHENSIVE METABOLIC PANEL
ALT: 12 U/L — ABNORMAL LOW (ref 14–54)
ANION GAP: 13 (ref 5–15)
AST: 21 U/L (ref 15–41)
Albumin: 4 g/dL (ref 3.5–5.0)
Alkaline Phosphatase: 84 U/L (ref 38–126)
BUN: 27 mg/dL — ABNORMAL HIGH (ref 6–20)
CHLORIDE: 103 mmol/L (ref 101–111)
CO2: 24 mmol/L (ref 22–32)
CREATININE: 0.71 mg/dL (ref 0.44–1.00)
Calcium: 10.2 mg/dL (ref 8.9–10.3)
Glucose, Bld: 131 mg/dL — ABNORMAL HIGH (ref 65–99)
POTASSIUM: 3.2 mmol/L — AB (ref 3.5–5.1)
Sodium: 140 mmol/L (ref 135–145)
Total Bilirubin: 1 mg/dL (ref 0.3–1.2)
Total Protein: 9 g/dL — ABNORMAL HIGH (ref 6.5–8.1)

## 2017-09-14 LAB — CBC
HCT: 42.5 % (ref 35.0–47.0)
HEMOGLOBIN: 14.5 g/dL (ref 12.0–16.0)
MCH: 31.3 pg (ref 26.0–34.0)
MCHC: 34.1 g/dL (ref 32.0–36.0)
MCV: 91.7 fL (ref 80.0–100.0)
Platelets: 414 10*3/uL (ref 150–440)
RBC: 4.63 MIL/uL (ref 3.80–5.20)
RDW: 14.7 % — ABNORMAL HIGH (ref 11.5–14.5)
WBC: 12.1 10*3/uL — AB (ref 3.6–11.0)

## 2017-09-14 LAB — LIPASE, BLOOD: Lipase: 24 U/L (ref 11–51)

## 2017-09-14 MED ORDER — MORPHINE SULFATE (PF) 4 MG/ML IV SOLN
4.0000 mg | Freq: Once | INTRAVENOUS | Status: AC
  Administered 2017-09-14: 4 mg via INTRAVENOUS
  Filled 2017-09-14: qty 1

## 2017-09-14 MED ORDER — ONDANSETRON HCL 4 MG/2ML IJ SOLN
4.0000 mg | Freq: Once | INTRAMUSCULAR | Status: AC
  Administered 2017-09-14: 4 mg via INTRAVENOUS
  Filled 2017-09-14: qty 2

## 2017-09-14 MED ORDER — SODIUM CHLORIDE 0.9 % IV BOLUS (SEPSIS)
1000.0000 mL | Freq: Once | INTRAVENOUS | Status: AC
  Administered 2017-09-14: 1000 mL via INTRAVENOUS

## 2017-09-14 MED ORDER — IOPAMIDOL (ISOVUE-300) INJECTION 61%
30.0000 mL | Freq: Once | INTRAVENOUS | Status: AC
  Administered 2017-09-14: 30 mL via ORAL

## 2017-09-14 MED ORDER — IOPAMIDOL (ISOVUE-300) INJECTION 61%
75.0000 mL | Freq: Once | INTRAVENOUS | Status: AC | PRN
  Administered 2017-09-14: 75 mL via INTRAVENOUS

## 2017-09-14 NOTE — ED Triage Notes (Signed)
**Note De-Identified [place] Obfuscation** Pt in [place] POV with complaints of abdominal pain, N/V since Friday, states she has not had BM since Friday as well.  Pt seen at PCP, advised to be evaluated here for possible diverticulitis.

## 2017-09-14 NOTE — Assessment & Plan Note (Signed)
**Note De-Identified [place] Obfuscation** Pt with significant history of diverticulitis and today's presentation is consistent with this diagnosis. New episode of pain.  Unable to keep down fluids.  Therefore, refer to the ER.  Her sister will drive her.  Report called to charge nurse.  No CBC or urine done as would be repeated in the ER

## 2017-09-14 NOTE — ED Notes (Signed)
**Note De-identified Weight Obfuscation** Pt unable to urinate at this time, given specimen cup for when is able to void and sent back out to the lobby. 

## 2017-09-14 NOTE — ED Notes (Signed)
**Note De-Identified [place] Obfuscation** Pt refused EKG, stated "It is not my heart. I just had an EKG done." Pt also stated that she "the pain radiated up yesterday not today." Ashley,RN notified.

## 2017-09-14 NOTE — ED Provider Notes (Signed)
**Note De-Identified Inglett Obfuscation** [facility] Emergency Department Provider Note  Time seen: 10:22 PM  I have reviewed the triage vital signs and the nursing notes.   HISTORY  Chief Complaint Abdominal Pain    HPI Haley [name] is a 57 y.o. female with a past medical history of diverticulitis twice over the past 6 months, anxiety, CHF, chronic pain, COPD, gastric reflux, hypertension, presents to the emergency department for abdominal pain nausea vomiting.  According to the patient for the past 48 hours she has been experiencing mild to moderate left lower quadrant abdominal pain along with nausea and vomiting and mild constipation.  States she had a very small bowel movement earlier today with what appeared to be more mucus than stool.  Patient states fever to 101.9 over the past 24-48 hours at home.  States nausea with occasional episodes of vomiting including at the emergency department per patient.  Describes her abdominal pain is dull aching moderate in her left lower quadrant.  Feels somewhat consistent with prior episodes of diverticulitis.   Past Medical History:  Diagnosis Date  . Abdominal pain 04/29/2015  . Acute diverticulitis of intestine 03/18/2017  . Acute pancreatitis 01/13/2017  . Acute sinusitis 09/01/2015  . Anemia   . Anxiety   . Benign neoplasm of sigmoid colon   . Breast cancer screening 07/08/2015  . CHF (congestive heart failure) ([place])    while hospitalized with pneumonia  . Chronic pain syndrome   . COPD (chronic obstructive pulmonary disease) ([place])    while hospitalized  . Cyclic vomiting syndrome   . Cyclical vomiting   . Diverticulitis of large intestine with abscess without bleeding   . Dysphagia   . GERD (gastroesophageal reflux disease)    controlled  . Hematuria, microscopic 04/26/2015   Urine Sept 2016; rechecked still present; refer to urologist; hx of "polyps" in bladder 25 years ago (1991-ish)   . History of pneumonia   . History of sepsis   . Hypertension     controlled on meds  . Hypokalemia 04/24/2015  . IBS (irritable bowel syndrome) 02/02/2017  . Leukocytosis   . Low serum potassium 01/13/2017  . Marital stress 07/12/2015   Separated from husband Fall 2016   . Moderate COPD (chronic obstructive pulmonary disease) ([place])   . Osteopenia Nov 2015  . Pain due to dental caries 05/09/2015  . Recurrent depressive disorder, current episode moderate ([place]) 04/24/2015  . Severe depression ([place]) 05/12/2016  . Special screening for malignant neoplasms, colon   . Tachycardia   . Thrombocytosis ([place])   . Vitamin D deficiency disease     Patient Active Problem List   Diagnosis Date Noted  . Left lower quadrant pain 09/14/2017  . History of acute pancreatitis 06/14/2017  . Diverticulitis of large intestine with abscess without bleeding   . Acute diverticulitis of intestine 03/18/2017  . IBS (irritable bowel syndrome) 02/02/2017  . Anxiety 02/02/2017  . Acute pancreatitis 01/13/2017  . Low serum potassium 01/13/2017  . Severe depression ([place]) 05/12/2016  . Special screening for malignant neoplasms, colon   . Benign neoplasm of sigmoid colon   . GERD (gastroesophageal reflux disease) 01/04/2016  . Acute sinusitis 09/01/2015  . Marital stress 07/12/2015  . Breast cancer screening 07/08/2015  . Pain due to dental caries 05/09/2015  . Abdominal pain 04/29/2015  . Hematuria, microscopic 04/26/2015  . Recurrent depressive disorder, current episode moderate ([place]) 04/24/2015  . Hypokalemia 04/24/2015  . Hypertension   . Vitamin D deficiency disease   . **Note De-Identified Venneman Obfuscation** Cyclical vomiting   . Moderate COPD (chronic obstructive pulmonary disease) ([place])   . Thrombocytosis ([place])   . Osteopenia 06/03/2014    Past Surgical History:  Procedure Laterality Date  . CESAREAN SECTION  09/27/79, 08/29/83   x 2  . COLONOSCOPY WITH PROPOFOL N/A 01/09/2016   Procedure: COLONOSCOPY WITH PROPOFOL;  Surgeon: Lucilla Lame, MD;  Location: [place];  Service: Endoscopy;   Laterality: N/A;  . POLYPECTOMY  01/09/2016   Procedure: POLYPECTOMY;  Surgeon: Lucilla Lame, MD;  Location: [place];  Service: Endoscopy;;    Prior to Admission medications   Medication Sig Start Date End Date Taking? Authorizing Provider  docusate sodium (COLACE) 100 MG capsule Take 1 tablet once or twice daily as needed for constipation while taking narcotic pain medicine 05/26/17   Hinda Kehr, MD  Glycopyrrolate-Formoterol (BEVESPI AEROSPHERE) 9-4.8 MCG/ACT AERO Inhale 2 puffs into the lungs 2 (two) times daily. Patient not taking: Reported on 09/14/2017 10/16/16   Kathrine Haddock, NP  HYDROcodone-acetaminophen (NORCO/VICODIN) 5-325 MG tablet Take 1-2 tablets by mouth every 6 (six) hours as needed for moderate pain. Patient not taking: Reported on 09/14/2017 05/26/17   Hinda Kehr, MD  LORazepam (ATIVAN) 1 MG tablet TAKE 1 TABLET BY MOUTH ONCE DAILY AS NEEDED FOR ANXIETY 05/10/17   Kathrine Haddock, NP  meloxicam (MOBIC) 7.5 MG tablet Take 1 tablet (7.5 mg total) by mouth daily. Patient not taking: Reported on 09/14/2017 07/20/17   [name], MD  metoprolol succinate (TOPROL-XL) 50 MG 24 hr tablet Take 1 tablet (50 mg total) by mouth daily. Take with or immediately following a meal. 10/13/16   Kathrine Haddock, NP  mirtazapine (REMERON) 30 MG tablet Take 1 tablet (30 mg total) by mouth at bedtime. 05/28/17   Kathrine Haddock, NP  Multiple Vitamin (MULTIVITAMIN) tablet Take 1 tablet by mouth daily.    [provider]  omeprazole (PRILOSEC) 40 MG capsule Take 40 mg by mouth daily.    [provider]  ondansetron (ZOFRAN ODT) 4 MG disintegrating tablet Allow 1-2 tablets to dissolve in your mouth every 8 hours as needed for nausea/vomiting 05/26/17   Hinda Kehr, MD    Allergies  Allergen Reactions  . Doxycycline Nausea And Vomiting  . Sulfur Diarrhea and Nausea And Vomiting    Family History  Problem Relation Age of Onset  . Cancer Father        prostate   . Heart disease Father   . Heart attack Father   . Hypertension Father   . Stroke Neg Hx   . COPD Neg Hx     Social History Social History   Tobacco Use  . Smoking status: Current Every Day Smoker    Packs/day: 1.00    Years: 35.00    Pack years: 35.00    Types: Cigarettes    Last attempt to quit: 08/03/2013    Years since quitting: 4.1  . Smokeless tobacco: Never Used  Substance Use Topics  . Alcohol use: Yes    Alcohol/week: 1.8 oz    Types: 3 Glasses of wine per week    Comment: occasional  . Drug use: No    Review of Systems Constitutional: Fever to 101.9 at home. Eyes: Negative for visual complaints ENT: Negative for recent illness/congestion Cardiovascular: Negative for chest pain. Respiratory: Negative for shortness of breath. Gastrointestinal: Left lower quadrant abdominal pain, dull aching.  Positive for nausea vomiting.  Mild constipation.  Negative for diarrhea. Genitourinary: Negative for dysuria or hematuria. **Note De-Identified Biancardi Obfuscation** Musculoskeletal: Negative for musculoskeletal complaints Skin: Negative for skin complaints  Neurological: Negative for headache All other ROS negative  ____________________________________________   PHYSICAL EXAM:  VITAL SIGNS: ED Triage Vitals  Enc Vitals Group     BP 09/14/17 1819 (!) 147/104     Pulse Rate 09/14/17 1819 (!) 116     Resp 09/14/17 1819 20     Temp 09/14/17 1819 99.1 F (37.3 C)     Temp Source 09/14/17 1819 Oral     SpO2 09/14/17 1819 96 %     Weight 09/14/17 1820 89 lb (40.4 kg)     Height 09/14/17 1820 4\' 11"  (1.499 m)     Head Circumference --      Peak Flow --      Pain Score 09/14/17 1820 5     Pain Loc --      Pain Edu? --      Excl. in [place]? --    Constitutional: Alert and oriented. Well appearing and in no distress. Eyes: Normal exam ENT   Head: Normocephalic and atraumatic.   Mouth/Throat: Mucous membranes are moist. Cardiovascular: Normal rate, regular rhythm. No murmur Respiratory: Normal  respiratory effort without tachypnea nor retractions. Breath sounds are clear Gastrointestinal: Soft, moderate left lower quadrant tenderness to palpation.  No rebound or guarding.  No distention. Musculoskeletal: Nontender with normal range of motion in all extremities.  Neurologic:  Normal speech and language. No gross focal neurologic deficits  Skin:  Skin is warm, dry and intact.  Psychiatric: Mood and affect are normal.   ____________________________________________     RADIOLOGY  CT pending  ____________________________________________   INITIAL IMPRESSION / ASSESSMENT AND PLAN / ED COURSE  Pertinent labs & imaging results that were available during my care of the patient were reviewed by me and considered in my medical decision making (see chart for details).  Patient presents to the emergency department for left lower quadrant abdominal pain nausea vomiting and mild constipation.  Patient states this feels similar to past episodes of diverticulitis.  Differential would include diverticulitis, colitis, gastroenteritis, other intra-abdominal pathology such as urinary tract infection or pyelonephritis.  We will check labs, CT scan of the abdomen, treat pain nausea IV hydrate while awaiting results.  Patient's labs show mild leukocytosis of 12,000 otherwise largely within normal limits.  LFTs and lipase are normal.  Given the patient's reported fever at home along with left lower quadrant tenderness we will proceed with a CT scan of the abdomen/pelvis to further evaluate and help rule out diverticulitis or perforation/abscess.  Patient agreeable to this plan of care.  CT and urinalysis pending.  Patient care signed out to Dr. Owens Shark.  ____________________________________________   FINAL CLINICAL IMPRESSION(S) / ED DIAGNOSES  Left lower quadrant abdominal pain Nausea vomiting    Harvest Dark, MD 09/14/17 2324

## 2017-09-14 NOTE — Progress Notes (Signed)
**Note De-Identified Scherzinger Obfuscation** BP 135/87   Pulse (!) 123   Temp 99.5 F (37.5 C) (Oral)   Wt 90 lb 9.6 oz (41.1 kg)   SpO2 95%   BMI 18.30 kg/m    Subjective:    Patient ID: Haley [name], female    DOB: [birth year], 57 y.o.   MRN: [id number]  HPI: Haley [name] is a 57 y.o. female  Chief Complaint  Patient presents with  . Diverticulitis    pt states she has been having a diverticulitis flare since Friday, states the pain started Saturday   Pt is here for abdominal pain.  Has a history of diverticulitis.  She has not eaten anything for several days.  Drinks but throws it up.  States her fever was 101 over the weekend.  Feels "fluish" Abdominal Pain  This is a new problem. The current episode started in the past 7 days. The onset quality is undetermined. The problem occurs constantly. The problem has been gradually worsening. The pain is located in the LLQ. The pain is severe. The quality of the pain is aching and colicky. The abdominal pain does not radiate. Associated symptoms include anorexia, belching, a fever, nausea and vomiting. Pertinent negatives include no diarrhea, flatus, headaches, hematuria or weight loss. Nothing aggravates the pain. The pain is relieved by nothing. She has tried nothing for the symptoms.   Past Medical History:  Diagnosis Date  . Abdominal pain 04/29/2015  . Acute diverticulitis of intestine 03/18/2017  . Acute pancreatitis 01/13/2017  . Acute sinusitis 09/01/2015  . Anemia   . Anxiety   . Benign neoplasm of sigmoid colon   . Breast cancer screening 07/08/2015  . CHF (congestive heart failure) ([place])    while hospitalized with pneumonia  . Chronic pain syndrome   . COPD (chronic obstructive pulmonary disease) ([place])    while hospitalized  . Cyclic vomiting syndrome   . Cyclical vomiting   . Diverticulitis of large intestine with abscess without bleeding   . Dysphagia   . GERD (gastroesophageal reflux disease)    controlled  . Hematuria, microscopic 04/26/2015   Urine Sept  2016; rechecked still present; refer to urologist; hx of "polyps" in bladder 25 years ago (1991-ish)   . History of pneumonia   . History of sepsis   . Hypertension    controlled on meds  . Hypokalemia 04/24/2015  . IBS (irritable bowel syndrome) 02/02/2017  . Leukocytosis   . Low serum potassium 01/13/2017  . Marital stress 07/12/2015   Separated from husband Fall 2016   . Moderate COPD (chronic obstructive pulmonary disease) ([place])   . Osteopenia Nov 2015  . Pain due to dental caries 05/09/2015  . Recurrent depressive disorder, current episode moderate ([place]) 04/24/2015  . Severe depression ([place]) 05/12/2016  . Special screening for malignant neoplasms, colon   . Tachycardia   . Thrombocytosis ([place])   . Vitamin D deficiency disease       Relevant past medical, surgical, family and social history reviewed and updated as indicated. Interim medical history since our last visit reviewed. Allergies and medications reviewed and updated.  Review of Systems  Constitutional: Positive for fever. Negative for weight loss.  Gastrointestinal: Positive for abdominal pain, anorexia, nausea and vomiting. Negative for diarrhea and flatus.  Genitourinary: Negative for hematuria.  Neurological: Negative for headaches.    Per HPI unless specifically indicated above     Objective:    BP 135/87   Pulse (!) 123   Temp 99.5 F ( **Note De-Identified Holecek Obfuscation** 37.5 C) (Oral)   Wt 90 lb 9.6 oz (41.1 kg)   SpO2 95%   BMI 18.30 kg/m   Wt Readings from Last 3 Encounters:  09/14/17 90 lb 9.6 oz (41.1 kg)  07/20/17 91 lb 3.2 oz (41.4 kg)  06/28/17 91 lb (41.3 kg)    Physical Exam  Constitutional: She is oriented to person, place, and time. She appears well-developed and well-nourished. No distress.  HENT:  Head: Normocephalic and atraumatic.  Eyes: Conjunctivae and lids are normal. Right eye exhibits no discharge. Left eye exhibits no discharge. No scleral icterus.  Neck: Normal range of motion. Neck supple. No JVD present.  Carotid bruit is not present.  Cardiovascular: Normal rate, regular rhythm and normal heart sounds.  Pulmonary/Chest: Effort normal and breath sounds normal.  Abdominal: Normal appearance. There is no splenomegaly or hepatomegaly.  Musculoskeletal: Normal range of motion.  Neurological: She is alert and oriented to person, place, and time.  Skin: Skin is warm, dry and intact. No rash noted. No pallor.  Psychiatric: She has a normal mood and affect. Her behavior is normal. Judgment and thought content normal.      Assessment & Plan:   Problem List Items Addressed This Visit      Unprioritized   Acute diverticulitis of intestine    Pt with significant history of diverticulitis and today's presentation is consistent with this diagnosis. New episode of pain.  Unable to keep down fluids.  Therefore, refer to the ER.  Her sister will drive her.  Report called to charge nurse.  No CBC or urine done as would be repeated in the ER      Left lower quadrant pain - Primary    Suspect diverticultitis.  However, significant history of pancreatitis.  Refer to the ER for differential       Discussed pt with Dr. Wynetta Emery  Follow up plan: F/U in the ER

## 2017-09-14 NOTE — ED Notes (Signed)
**Note De-Identified Kun Obfuscation** CT notified of CT contrast completion.

## 2017-09-14 NOTE — ED Notes (Signed)
**Note De-Identified [place] Obfuscation** Patient states history of diverticulitis and this pain is the same pain; feels certain this is a flare. Patient states she has been on a liquid diet since Friday when the pain started and has also had nausea and vomiting. Patient is alert and oriented. Husband at bedside.

## 2017-09-14 NOTE — Assessment & Plan Note (Signed)
**Note De-Identified Ramey Obfuscation** Suspect diverticultitis.  However, significant history of pancreatitis.  Refer to the ER for differential

## 2017-09-15 DIAGNOSIS — J449 Chronic obstructive pulmonary disease, unspecified: Secondary | ICD-10-CM | POA: Diagnosis present

## 2017-09-15 DIAGNOSIS — E43 Unspecified severe protein-calorie malnutrition: Secondary | ICD-10-CM | POA: Diagnosis present

## 2017-09-15 DIAGNOSIS — K5720 Diverticulitis of large intestine with perforation and abscess without bleeding: Secondary | ICD-10-CM | POA: Diagnosis present

## 2017-09-15 DIAGNOSIS — Z681 Body mass index (BMI) 19 or less, adult: Secondary | ICD-10-CM | POA: Diagnosis not present

## 2017-09-15 DIAGNOSIS — I10 Essential (primary) hypertension: Secondary | ICD-10-CM | POA: Diagnosis present

## 2017-09-15 DIAGNOSIS — R1032 Left lower quadrant pain: Secondary | ICD-10-CM | POA: Diagnosis present

## 2017-09-15 DIAGNOSIS — Z8701 Personal history of pneumonia (recurrent): Secondary | ICD-10-CM | POA: Diagnosis not present

## 2017-09-15 DIAGNOSIS — Z8249 Family history of ischemic heart disease and other diseases of the circulatory system: Secondary | ICD-10-CM | POA: Diagnosis not present

## 2017-09-15 DIAGNOSIS — K5732 Diverticulitis of large intestine without perforation or abscess without bleeding: Secondary | ICD-10-CM | POA: Diagnosis present

## 2017-09-15 DIAGNOSIS — F17210 Nicotine dependence, cigarettes, uncomplicated: Secondary | ICD-10-CM | POA: Diagnosis present

## 2017-09-15 DIAGNOSIS — K219 Gastro-esophageal reflux disease without esophagitis: Secondary | ICD-10-CM | POA: Diagnosis present

## 2017-09-15 DIAGNOSIS — G894 Chronic pain syndrome: Secondary | ICD-10-CM | POA: Diagnosis present

## 2017-09-15 LAB — URINALYSIS, COMPLETE (UACMP) WITH MICROSCOPIC
BILIRUBIN URINE: NEGATIVE
Bacteria, UA: NONE SEEN
Glucose, UA: NEGATIVE mg/dL
Ketones, ur: 20 mg/dL — AB
LEUKOCYTES UA: NEGATIVE
Nitrite: NEGATIVE
PH: 7 (ref 5.0–8.0)
Protein, ur: NEGATIVE mg/dL

## 2017-09-15 MED ORDER — ACETAMINOPHEN 325 MG PO TABS
650.0000 mg | ORAL_TABLET | Freq: Four times a day (QID) | ORAL | Status: DC | PRN
  Administered 2017-09-16: 650 mg via ORAL
  Filled 2017-09-15: qty 2

## 2017-09-15 MED ORDER — MORPHINE SULFATE (PF) 4 MG/ML IV SOLN
INTRAVENOUS | Status: AC
  Filled 2017-09-15: qty 1

## 2017-09-15 MED ORDER — MORPHINE SULFATE (PF) 4 MG/ML IV SOLN
4.0000 mg | Freq: Once | INTRAVENOUS | Status: AC
  Administered 2017-09-15: 4 mg via INTRAVENOUS

## 2017-09-15 MED ORDER — PIPERACILLIN-TAZOBACTAM 3.375 G IVPB
3.3750 g | Freq: Three times a day (TID) | INTRAVENOUS | Status: DC
  Administered 2017-09-15 – 2017-09-20 (×15): 3.375 g via INTRAVENOUS
  Filled 2017-09-15 (×15): qty 50

## 2017-09-15 MED ORDER — METOPROLOL SUCCINATE ER 50 MG PO TB24
50.0000 mg | ORAL_TABLET | Freq: Every day | ORAL | Status: DC
  Administered 2017-09-16 – 2017-09-18 (×3): 50 mg via ORAL
  Filled 2017-09-15 (×5): qty 1

## 2017-09-15 MED ORDER — PANTOPRAZOLE SODIUM 40 MG PO TBEC
40.0000 mg | DELAYED_RELEASE_TABLET | Freq: Every day | ORAL | Status: DC
  Administered 2017-09-16 – 2017-09-20 (×5): 40 mg via ORAL
  Filled 2017-09-15 (×5): qty 1

## 2017-09-15 MED ORDER — HYDROCODONE-ACETAMINOPHEN 5-325 MG PO TABS
1.0000 | ORAL_TABLET | Freq: Four times a day (QID) | ORAL | Status: DC | PRN
  Administered 2017-09-15: 2 via ORAL
  Administered 2017-09-18 – 2017-09-20 (×3): 1 via ORAL
  Filled 2017-09-15: qty 1
  Filled 2017-09-15: qty 2
  Filled 2017-09-15 (×2): qty 1

## 2017-09-15 MED ORDER — ACETAMINOPHEN 650 MG RE SUPP: 650.0000 mg | Freq: Four times a day (QID) | RECTAL | Status: DC | PRN

## 2017-09-15 MED ORDER — DOCUSATE SODIUM 100 MG PO CAPS
100.0000 mg | ORAL_CAPSULE | Freq: Two times a day (BID) | ORAL | Status: DC
  Administered 2017-09-15 – 2017-09-20 (×11): 100 mg via ORAL
  Filled 2017-09-15 (×10): qty 1

## 2017-09-15 MED ORDER — PIPERACILLIN-TAZOBACTAM 3.375 G IVPB 30 MIN
3.3750 g | Freq: Once | INTRAVENOUS | Status: AC
  Administered 2017-09-15: 3.375 g via INTRAVENOUS
  Filled 2017-09-15: qty 50

## 2017-09-15 MED ORDER — ONDANSETRON 4 MG PO TBDP
4.0000 mg | ORAL_TABLET | Freq: Four times a day (QID) | ORAL | Status: DC | PRN
  Administered 2017-09-16 – 2017-09-20 (×4): 4 mg via ORAL
  Filled 2017-09-15 (×2): qty 1

## 2017-09-15 MED ORDER — ONDANSETRON 4 MG PO TBDP
4.0000 mg | ORAL_TABLET | Freq: Three times a day (TID) | ORAL | Status: DC | PRN
  Filled 2017-09-15 (×2): qty 1

## 2017-09-15 MED ORDER — KCL IN DEXTROSE-NACL 20-5-0.2 MEQ/L-%-% IV SOLN
INTRAVENOUS | Status: DC
  Administered 2017-09-15 – 2017-09-19 (×7): via INTRAVENOUS
  Filled 2017-09-15 (×10): qty 1000

## 2017-09-15 MED ORDER — FAMOTIDINE 20 MG PO TABS
20.0000 mg | ORAL_TABLET | Freq: Two times a day (BID) | ORAL | Status: DC | PRN
Start: 2017-09-15 — End: 2017-09-20

## 2017-09-15 MED ORDER — MIRTAZAPINE 15 MG PO TABS
30.0000 mg | ORAL_TABLET | Freq: Every day | ORAL | Status: DC
  Administered 2017-09-15 – 2017-09-20 (×5): 30 mg via ORAL
  Filled 2017-09-15 (×7): qty 2

## 2017-09-15 MED ORDER — LORAZEPAM 1 MG PO TABS
1.0000 mg | ORAL_TABLET | Freq: Two times a day (BID) | ORAL | Status: DC | PRN
  Administered 2017-09-15 – 2017-09-20 (×7): 1 mg via ORAL
  Filled 2017-09-15 (×7): qty 1

## 2017-09-15 MED ORDER — ONDANSETRON HCL 4 MG/2ML IJ SOLN
4.0000 mg | Freq: Four times a day (QID) | INTRAMUSCULAR | Status: DC | PRN
  Administered 2017-09-15 – 2017-09-20 (×4): 4 mg via INTRAVENOUS
  Filled 2017-09-15 (×4): qty 2

## 2017-09-15 NOTE — Progress Notes (Signed)
**Note De-Identified [place] Obfuscation** She reports minimal discomfort in the left lower quadrant.  She has had some minimal nausea since admission and took Zofran and resolved.  She reports no vomiting since admission.  She reports her heartburn has resolved.  She has passed some flatus per rectum but no bowel movement.  She has good urine output.  She has been walking well in the hallway.  On examination she is awake alert oriented.  Vital signs stable.  Afebrile.  Abdomen is soft with minimal left lower quadrant tenderness.  Diagnosis diverticulitis of the sigmoid colon with contained perforation and obstipation  I discussed treatment with intravenous fluids and Zosyn.  Advised her we will not try to feed her at present as she has not moved her bowels.  May possibly need to insert a PICC line and start on central venous hyperalimentation.  Does not need to have any urgent surgery today.  I did further discuss potential need for surgery depending on progress

## 2017-09-15 NOTE — H&P (Signed)
**Note De-Identified Higinbotham Obfuscation** Haley [name] is an 57 y.o. female.  Chief Complaint: Abdominal pain  HPI: She reports about 5 days ago she developed some flulike symptoms with fever and muscle aches temperature in last 2 days has been as high as 101.9. She reports that over a period of time she gradually developed left lower quadrant abdominal pain. She reports her last bowel movement was 6 days ago. She does however continue to pass some flatus per rectum. She reports she is also had numerous episodes of nausea and vomiting. There was no hematemesis. She reports the last time she vomited was about 4 hours ago. She has been passing her urine satisfactorily.  She reports previous bouts of diverticulitis and was in the hospital for several days for intravenous antibiotics. Also had another bout of diverticulitis which was treated with antibiotics as outpatient.      Past Medical History:  Diagnosis Date  . Abdominal pain 04/29/2015  . Acute diverticulitis of intestine 03/18/2017  . Acute pancreatitis 01/13/2017  . Acute sinusitis 09/01/2015  . Anemia   . Anxiety   . Benign neoplasm of sigmoid colon   . Breast cancer screening 07/08/2015  . CHF (congestive heart failure) ([place])    while hospitalized with pneumonia  . Chronic pain syndrome   . COPD (chronic obstructive pulmonary disease) ([place])    while hospitalized  . Cyclic vomiting syndrome   . Cyclical vomiting   . Diverticulitis of large intestine with abscess without bleeding   . Dysphagia   . GERD (gastroesophageal reflux disease)    controlled  . Hematuria, microscopic 04/26/2015   Urine Sept 2016; rechecked still present; refer to urologist; hx of "polyps" in bladder 25 years ago (1991-ish)   . History of pneumonia   . History of sepsis   . Hypertension    controlled on meds  . Hypokalemia 04/24/2015  . IBS (irritable bowel syndrome) 02/02/2017  . Leukocytosis   . Low serum potassium 01/13/2017  . Marital stress 07/12/2015   Separated from husband Fall 2016   .  Moderate COPD (chronic obstructive pulmonary disease) ([place])   . Osteopenia Nov 2015  . Pain due to dental caries 05/09/2015  . Recurrent depressive disorder, current episode moderate ([place]) 04/24/2015  . Severe depression ([place]) 05/12/2016  . Special screening for malignant neoplasms, colon   . Tachycardia   . Thrombocytosis ([place])   . Vitamin D deficiency disease   She does report that she had pneumonia about 4 years ago.  She is aware of the diagnosis of pulmonary disease. She reports no history of heart disease. Other past medical history was reviewed as noted above.       Past Surgical History:  Procedure Laterality Date  . CESAREAN SECTION  09/27/79, 08/29/83   x 2  . COLONOSCOPY WITH PROPOFOL N/A 01/09/2016   Procedure: COLONOSCOPY WITH PROPOFOL; Surgeon: Lucilla Lame, MD; Location: [facility]; Service: Endoscopy; Laterality: N/A;  . POLYPECTOMY  01/09/2016   Procedure: POLYPECTOMY; Surgeon: Lucilla Lame, MD; Location: [facility]; Service: Endoscopy;;  She has had C-section x2, no other abdominal surgery       Family History  Problem Relation Age of Onset  . Cancer Father    prostate  . Heart disease Father   . Heart attack Father   . Hypertension Father   . Stroke Neg Hx   . COPD Neg Hx    Social History: reports that she has been smoking cigarettes. She has a 35.00 pack-year smoking history. she **Note De-Identified [place] Obfuscation** has never used smokeless tobacco. She reports that she drinks about 1.8 oz of alcohol per week. She reports that she does not use drugs.  Allergies:      Allergies  Allergen Reactions  . Doxycycline Nausea And Vomiting  . Sulfur Diarrhea and Nausea And Vomiting   REVIEW OF SYSTEMS: She reports no other recent acute illness such as cough cold or sore throat.  She reports no recent visual changes.  No swollen glands in her neck, no difficulty swallowing.  She is having some heartburn.  She reports no dyspnea on exertion.  She reports no chest pains.  She reports she has been  voiding satisfactorily.  She reports no recent ankle edema and no recent sores or boils.  Review of systems otherwise negative. (Not in a hospital admission)  Lab Results Last 48 Hours                                                                                                                                                                                                                                                                                                                                                        Imaging Results (Last 48 hours)     Blood pressure (!) 148/94, pulse 88, temperature 99.1 F (37.3 C), temperature source Oral, resp. rate 18, height 4\' 11"  (1.499 m), weight 40.4 kg (89 lb), SpO2 96 %.  Physical Exam: She is awake alert and in minimal degree of distress.  HEENT: Head is normocephalic. Pupils are equal reactive to light. Extraocular movements are intact. Sclera is clear. Pharynx is clear.  NECK: Supple with no palpable mass and no adenopathy.  LUNGS: Clear without rales rhonchi or wheezes.  HEART: Regular rhythm S1-S2, without murmur.  ABDOMEN: Is flat with minimal left lower quadrant tenderness with no guarding. No palpable **Note De-Identified Seelinger Obfuscation** mass.  RECTUM: There is no external appearance of dermatitis. Digital exam demonstrates good sphincter tone. The uterus is palpable anterior to the rectum. There is mild pelvic tenderness.  EXTREMITIES: Well-developed with no dependent edema  NEUROLOGIC: Awake alert moving all extremities  CLINICAL DATA: Lab work reviewed. I also reviewed her CT images demonstrating thickening of the wall of the sigmoid colon with evidence of fluid outside of the colon consistent with possible perforation and abscess. There is also moderate amount of stool within the colon. According to the radiologist report there is narrowing of the short segment of colon. There is some mild distention of the  stomach largely with CT oral contrast.  Assessment/Plan  Diverticulitis with contained perforation and abscess formation  I recommended admission to the hospital for intravenous antibiotics. Keep her n.p.o. except for sips of water with medicine. May possibly need nasogastric suction depending on progress. Place her on a course of intravenous Zosyn. I discussed the possibilities of needing emergency surgery.  Rochel Brome, MD

## 2017-09-15 NOTE — Progress Notes (Signed)
**Note De-Identified [place] Obfuscation** Initial Nutrition Assessment  DOCUMENTATION CODES:   Severe malnutrition in context of acute illness/injury  INTERVENTION:   Recommend TPN if unable to advance diet in 1-2 days. Pt at high refeeding risk; monitor K, Mg, and P labs  NUTRITION DIAGNOSIS:   Severe Malnutrition related to acute illness as evidenced by energy intake < or equal to 50% for > or equal to 5 days, mild fat depletion, moderate muscle depletion.  GOAL:   Patient will meet greater than or equal to 90% of their needs  MONITOR:   Diet advancement, Labs, Weight trends, I & O's  REASON FOR ASSESSMENT:   Consult Assessment of nutrition requirement/status  ASSESSMENT:   57 y.o. female with a past medical history of diverticulitis twice over the past 6 months, anxiety, CHF, chronic pain, COPD, gastric reflux, hypertension, presents to the emergency department for abdominal pain nausea vomiting.   Met with pt in room today. Pt reports poor appetite and oral intake for over a week and reports that she has not eaten anything since last Thursday. Pt reports that her appetite is returning today and she is asking to eat. Pt reports that her abdominal pain is improving and she denies any nausea or vomiting. Per chart, pt is weight stable pta. Pt is about 2lbs less than her UBW which she reports is around 91-93lbs. Pt reports that does drink vanilla and chocolate Ensure and would like to have these when her diet is advanced. Spoke to MD today about TPN. Plan is for pt to remain NPO today and possibly advance to clear liquids tomorrow. If pt is unable to advance to clear liquids in 1-2 days; recommend TPN. Pt is at high refeeding risk; recommend monitor K, Mg, and P. RD provided pt with education today regarding a diverticulitis friendly diet. Pt was given handouts with supporting information.      Medications reviewed and include: colace, remeron, protonix, NaCl w/ 5% dextrose + KCl @100ml /hr, zosyn, zofran  Labs reviewed: K  3.2(L), BUN 27(H)- 2/12 Wbc- 12.1(H)- 2/12  Nutrition-Focused physical exam completed. Findings are mild fat depletions, mild to moderate muscle depletions, and no edema.   Diet Order:  Diet NPO time specified  EDUCATION NEEDS:   Education needs have been addressed  Skin: Reviewed RN Assessment  Last BM:  PTA  Height:   Ht Readings from Last 1 Encounters:  09/14/17 4' 11"  (1.499 m)    Weight:   Wt Readings from Last 1 Encounters:  09/14/17 89 lb (40.4 kg)    Ideal Body Weight:  44.5 kg  BMI:  Body mass index is 17.98 kg/m.  Estimated Nutritional Needs:   Kcal:  1200-1400kcal/day   Protein:  53-61g/day   Fluid:  >1.2L/day   Koleen Distance MS, RD, LDN Pager #[phone] After Hours Pager: [phone]

## 2017-09-15 NOTE — ED Notes (Signed)
**Note De-Identified [place] Obfuscation** Returned from CT [facility] stretcher. Patient alert and oriented.

## 2017-09-15 NOTE — Plan of Care (Signed)
**Note De-Identified [place] Obfuscation** Pt has been resting most of the day. Ambulated around nurses station

## 2017-09-15 NOTE — H&P (Signed)
**Note De-Identified [place] [facility]bfuscation** Haley [name] is an 57 y.o. female.   Chief Complaint: Abdominal pain HPI: She reports about 5 days ago she developed some flulike symptoms with fever and muscle aches temperature in last 2 days has been as high as 101.9.  She reports that over a period of time she gradually developed left lower quadrant abdominal pain.  She reports her last bowel movement was 6 days ago.  She does however continue to pass some flatus per rectum.  She reports she is also had numerous episodes of nausea and vomiting.  There was no hematemesis.  She reports the last time she vomited was about 4 hours ago.  She has been passing her urine satisfactorily.  She reports previous bouts of diverticulitis and was in the hospital for several days for intravenous antibiotics.  Also had another bout of diverticulitis which was treated with antibiotics as outpatient.  Past Medical History:  Diagnosis Date  . Abdominal pain 04/29/2015  . Acute diverticulitis of intestine 03/18/2017  . Acute pancreatitis 01/13/2017  . Acute sinusitis 09/01/2015  . Anemia   . Anxiety   . Benign neoplasm of sigmoid colon   . Breast cancer screening 07/08/2015  . CHF (congestive heart failure) ([place])    while hospitalized with pneumonia  . Chronic pain syndrome   . COPD (chronic obstructive pulmonary disease) ([place])    while hospitalized  . Cyclic vomiting syndrome   . Cyclical vomiting   . Diverticulitis of large intestine with abscess without bleeding   . Dysphagia   . GERD (gastroesophageal reflux disease)    controlled  . Hematuria, microscopic 04/26/2015   Urine Sept 2016; rechecked still present; refer to urologist; hx of "polyps" in bladder 25 years ago (1991-ish)   . History of pneumonia   . History of sepsis   . Hypertension    controlled on meds  . Hypokalemia 04/24/2015  . IBS (irritable bowel syndrome) 02/02/2017  . Leukocytosis   . Low serum potassium 01/13/2017  . Marital stress 07/12/2015   Separated from husband Fall 2016   .  Moderate COPD (chronic obstructive pulmonary disease) ([place])   . Osteopenia Nov 2015  . Pain due to dental caries 05/09/2015  . Recurrent depressive disorder, current episode moderate ([place]) 04/24/2015  . Severe depression ([place]) 05/12/2016  . Special screening for malignant neoplasms, colon   . Tachycardia   . Thrombocytosis ([place])   . Vitamin D deficiency disease   She does report that she had pneumonia about 4 years ago.  She is aware of the diagnosis of pulmonary disease.  She reports no history of heart disease.  Other past medical history was reviewed as noted above.  Past Surgical History:  Procedure Laterality Date  . CESAREAN SECTION  09/27/79, 08/29/83   x 2  . COLONOSCOPY WITH PROPOFOL N/A 01/09/2016   Procedure: COLONOSCOPY WITH PROPOFOL;  Surgeon: Lucilla Lame, MD;  Location: [facility];  Service: Endoscopy;  Laterality: N/A;  . POLYPECTOMY  01/09/2016   Procedure: POLYPECTOMY;  Surgeon: Lucilla Lame, MD;  Location: [facility];  Service: Endoscopy;;  She has had C-section x2, no other abdominal surgery  Family History  Problem Relation Age of Onset  . Cancer Father        prostate  . Heart disease Father   . Heart attack Father   . Hypertension Father   . Stroke Neg Hx   . COPD Neg Hx    Social History:  reports that she has been smoking cigarettes. **Note De-Identified [place] Obfuscation** She has a 35.00 pack-year smoking history. she has never used smokeless tobacco. She reports that she drinks about 1.8 oz of alcohol per week. She reports that she does not use drugs.  Allergies:  Allergies  Allergen Reactions  . Doxycycline Nausea And Vomiting  . Sulfur Diarrhea and Nausea And Vomiting     (Not in a hospital admission)  Results for orders placed or performed during the hospital encounter of 09/14/17 (from the past 48 hour(s))  Lipase, blood     Status: None   Collection Time: 09/14/17  6:23 PM  Result Value Ref Range   Lipase 24 11 - 51 U/L    Comment: Performed at [facility], [address] [place], [place] [address]  Comprehensive metabolic panel     Status: Abnormal   Collection Time: 09/14/17  6:23 PM  Result Value Ref Range   Sodium 140 135 - 145 mmol/L   Potassium 3.2 (L) 3.5 - 5.1 mmol/L   Chloride 103 101 - 111 mmol/L   CO2 24 22 - 32 mmol/L   Glucose, Bld 131 (H) 65 - 99 mg/dL   BUN 27 (H) 6 - 20 mg/dL   Creatinine, Ser 0.71 0.44 - 1.00 mg/dL   Calcium 10.2 8.9 - 10.3 mg/dL   Total Protein 9.0 (H) 6.5 - 8.1 g/dL   Albumin 4.0 3.5 - 5.0 g/dL   AST 21 15 - 41 U/L   ALT 12 (L) 14 - 54 U/L   Alkaline Phosphatase 84 38 - 126 U/L   Total Bilirubin 1.0 0.3 - 1.2 mg/dL   GFR calc non Af Amer >60 >60 mL/min   GFR calc Af Amer >60 >60 mL/min    Comment: (NOTE) The eGFR has been calculated using the CKD EPI equation. This calculation has not been validated in all clinical situations. eGFR's persistently <60 mL/min signify possible Chronic Kidney Disease.    Anion gap 13 5 - 15    Comment: Performed at [facility], [address] [place], [place] [address]  CBC     Status: Abnormal   Collection Time: 09/14/17  6:23 PM  Result Value Ref Range   WBC 12.1 (H) 3.6 - 11.0 K/uL   RBC 4.63 3.80 - 5.20 MIL/uL   Hemoglobin 14.5 12.0 - 16.0 g/dL   HCT 42.5 35.0 - 47.0 %   MCV 91.7 80.0 - 100.0 fL   MCH 31.3 26.0 - 34.0 pg   MCHC 34.1 32.0 - 36.0 g/dL   RDW 14.7 (H) 11.5 - 14.5 %   Platelets 414 150 - 440 K/uL    Comment: Performed at [facility], [address] [place],  [address]  Urinalysis, Complete w Microscopic     Status: Abnormal   Collection Time: 09/15/17  1:55 AM  Result Value Ref Range   Color, Urine STRAW (A) YELLOW   APPearance CLEAR (A) CLEAR   Specific Gravity, Urine >1.046 (H) 1.005 - 1.030   pH 7.0 5.0 - 8.0   Glucose, UA NEGATIVE NEGATIVE mg/dL   Hgb urine dipstick MODERATE (A) NEGATIVE   Bilirubin Urine NEGATIVE NEGATIVE   Ketones, ur 20 (A) NEGATIVE mg/dL   Protein, ur NEGATIVE NEGATIVE mg/dL    Nitrite NEGATIVE NEGATIVE   Leukocytes, UA NEGATIVE NEGATIVE   RBC / HPF 6-30 0 - 5 RBC/hpf   WBC, UA 0-5 0 - 5 WBC/hpf   Bacteria, UA NONE SEEN NONE SEEN   Squamous Epithelial / LPF 0-5 (A) NONE **Note De-Identified [place] Obfuscation** SEEN    Comment: Performed at [facility], [address] [place], [place] [address]   Ct Abdomen Pelvis W Contrast  Result Date: 09/15/2017 CLINICAL DATA:  Abdominal pain, nausea and vomiting for 5 days, no bowel movement. Assess for diverticulitis. History of diverticulitis, irritable bowel syndrome, benign neoplasm of sigmoid colon. EXAM: CT ABDOMEN AND PELVIS WITH CONTRAST TECHNIQUE: Multidetector CT imaging of the abdomen and pelvis was performed using the standard protocol following bolus administration of intravenous contrast. CONTRAST:  99m ISOVUE-300 IOPAMIDOL (ISOVUE-300) INJECTION 61% COMPARISON:  CT abdomen and pelvis May 26, 2017 FINDINGS: LOWER CHEST: Lung bases are clear. Included heart size is normal. No pericardial effusion. HEPATOBILIARY: Liver and gallbladder are normal. Mild focal fatty infiltration about the gallbladder fossa. PANCREAS: Normal. SPLEEN: Normal. ADRENALS/URINARY TRACT: Kidneys are orthotopic, demonstrating symmetric enhancement. Mild LEFT hydroureteronephrosis the level of the pelvis, transition point sigmoid colon associated with diverticulitis. Too small to characterize hypodensities bilateral kidneys. No nephrolithiasis or solid renal masses. Delayed imaging through the kidneys demonstrates symmetric prompt contrast excretion within the proximal urinary collecting system. Urinary bladder is partially distended and unremarkable. Normal adrenal glands. STOMACH/BOWEL: 10 mm segment of severe sigmoid colonic wall thickening and inflammatory changes. Punctate calcification versus radiopaque foreign body sigmoid colon wall. Moderate volume retained large bowel stool. Small bowel is normal in course and caliber. Contrast distended stomach. VASCULAR/LYMPHATIC:  Aortoiliac vessels are normal in course and caliber. No lymphadenopathy by CT size criteria. REPRODUCTIVE: Thickened irregular LEFT fallopian tube associated with inflammatory changes in the pelvis. OTHER: Rim enhancing fluid collections LEFT pelvis contiguous with the sigmoid colon an uterus, difficult to discretely identified. Discontinuous serosal versus extraluminal 14 mm sigmoid abscess. Extensive inflammatory changes in the pelvis. MUSCULOSKELETAL: Nonacute.  Osteopenia. IMPRESSION: 1. Severe recurrent sigmoid diverticulitis versus colitis with contained perforation. Two small pelvic abscess. Low-grade distal bowel obstruction. 2. Mild LEFT hydroureteronephrosis, transition point in LEFT pelvis associated with inflammatory changes. 3. Critical Value/emergent results were called by telephone at the time of interpretation on 09/15/2017 at 12:43 am to Dr. BOwens Shark who verbally acknowledged these results. Aortic Atherosclerosis (ICD10-I70.0). Electronically Signed   By: CElon AlasM.D.   On: 09/15/2017 00:49    Blood pressure (!) 148/94, pulse 88, temperature 99.1 F (37.3 C), temperature source Oral, resp. rate 18, height 4' 11" (1.499 m), weight 40.4 kg (89 lb), SpO2 96 %.  Physical Exam: She is awake alert and in minimal degree of distress.  HEENT:  Head is normocephalic.  Pupils are equal reactive to light.  Extraocular movements are intact. Sclera is clear.  Pharynx is clear.  NECK:  Supple with no palpable mass and no adenopathy.  LUNGS:  Clear without rales rhonchi or wheezes.  HEART:  Regular rhythm S1-S2, without murmur.  ABDOMEN: Is flat with minimal left lower quadrant tenderness with no guarding.  No palpable mass.  RECTUM: There is no external appearance of dermatitis.  Digital exam demonstrates good sphincter tone.  The uterus is palpable anterior to the rectum.  There is mild pelvic tenderness.  EXTREMITIES: Well-developed with no dependent edema  NEUROLOGIC: Awake alert  moving all extremities  CLINICAL DATA: Lab work reviewed.  I also reviewed her CT images demonstrating thickening of the wall of the sigmoid colon with evidence of fluid outside of the colon consistent with possible perforation and abscess.  There is also moderate amount of stool within the colon.  According to the radiologist report there is narrowing of the short segment of colon. **Note De-Identified [place] Obfuscation** There is some mild distention of the stomach largely with CT oral contrast.  Assessment/Plan Diverticulitis with contained perforation and abscess formation  I recommended admission to the hospital for intravenous antibiotics.  Keep her n.p.o. except for sips of water with medicine.  May possibly need nasogastric suction depending on progress.  Place her on a course of intravenous Zosyn.  I discussed the possibilities of needing emergency surgery.  Rochel Brome, MD 09/15/2017, 2:34 AM

## 2017-09-15 NOTE — ED Provider Notes (Signed)
**Note De-Identified [place] Obfuscation** I assumed care of the patient from Dr. Kerman Passey at 11:30 PM.  Concern for possible diverticulitis and as such CT scan of the abdomen was performed which revealed sigmoid diverticulitis with perforation and to pelvic abscess.  Patient was given IV Zosyn in the emergency department.  Patient states that she seen Dr. Tollie Pizza in the past and would like to be evaluated by Dr. Bary Castilla.  As such patient discussed with Dr. Tamala Julian who is on-call for Dr. Tollie Pizza.  Dr. Tamala Julian presented to the emergency department and evaluated patient and subsequently admitted the patient.   Gregor Hams, MD 09/15/17 [phone]

## 2017-09-16 LAB — CBC WITH DIFFERENTIAL/PLATELET
Basophils Absolute: 0.1 10*3/uL (ref 0–0.1)
Basophils Relative: 1 %
EOS ABS: 0.2 10*3/uL (ref 0–0.7)
EOS PCT: 3 %
HCT: 35.2 % (ref 35.0–47.0)
Hemoglobin: 12 g/dL (ref 12.0–16.0)
Lymphocytes Relative: 24 %
Lymphs Abs: 1.7 10*3/uL (ref 1.0–3.6)
MCH: 31.3 pg (ref 26.0–34.0)
MCHC: 34.1 g/dL (ref 32.0–36.0)
MCV: 91.8 fL (ref 80.0–100.0)
MONO ABS: 0.5 10*3/uL (ref 0.2–0.9)
Monocytes Relative: 8 %
Neutro Abs: 4.4 10*3/uL (ref 1.4–6.5)
Neutrophils Relative %: 64 %
PLATELETS: 377 10*3/uL (ref 150–440)
RBC: 3.83 MIL/uL (ref 3.80–5.20)
RDW: 14.6 % — ABNORMAL HIGH (ref 11.5–14.5)
WBC: 6.9 10*3/uL (ref 3.6–11.0)

## 2017-09-16 LAB — MAGNESIUM: MAGNESIUM: 1.6 mg/dL — AB (ref 1.7–2.4)

## 2017-09-16 LAB — POTASSIUM: POTASSIUM: 3.5 mmol/L (ref 3.5–5.1)

## 2017-09-16 LAB — PHOSPHORUS: Phosphorus: 2.9 mg/dL (ref 2.5–4.6)

## 2017-09-16 MED ORDER — ENSURE ENLIVE PO LIQD
237.0000 mL | Freq: Three times a day (TID) | ORAL | Status: DC
  Administered 2017-09-16 – 2017-09-20 (×8): 237 mL via ORAL

## 2017-09-16 MED ORDER — TRACE MINERALS CR-CU-MN-SE-ZN 10-1000-500-60 MCG/ML IV SOLN
INTRAVENOUS | Status: AC
  Administered 2017-09-16: 19:00:00 via INTRAVENOUS
  Filled 2017-09-16: qty 960

## 2017-09-16 MED ORDER — POLYETHYLENE GLYCOL 3350 17 G PO PACK
17.0000 g | PACK | Freq: Every day | ORAL | Status: DC
  Administered 2017-09-16 – 2017-09-19 (×4): 17 g via ORAL
  Filled 2017-09-16 (×5): qty 1

## 2017-09-16 MED ORDER — SODIUM CHLORIDE 0.9% FLUSH
10.0000 mL | INTRAVENOUS | Status: DC | PRN
  Administered 2017-09-18: 10 mL
  Filled 2017-09-16: qty 40

## 2017-09-16 MED ORDER — FAT EMULSION 20 % IV EMUL
250.0000 mL | INTRAVENOUS | Status: AC
  Administered 2017-09-16: 250 mL via INTRAVENOUS
  Filled 2017-09-16: qty 250

## 2017-09-16 MED ORDER — SODIUM CHLORIDE 0.9% FLUSH
10.0000 mL | Freq: Two times a day (BID) | INTRAVENOUS | Status: DC
  Administered 2017-09-16 – 2017-09-20 (×4): 10 mL

## 2017-09-16 NOTE — Progress Notes (Signed)
**Note De-Identified [place] Obfuscation** PHARMACY - ADULT TOTAL PARENTERAL NUTRITION CONSULT NOTE   Pharmacy Consult for TPN Indication: malnutrition  Patient Measurements: Height: 4\' 11"  (149.9 cm) Weight: 89 lb (40.4 kg) IBW/kg (Calculated) : 43.2 TPN AdjBW (KG): 40.4 Body mass index is 17.98 kg/m.   Assessment: 57 y.o. female with a past medical history of diverticulitis twice over the past 6 months, anxiety, CHF, chronic pain, COPD, gastric reflux, hypertension, presents to the emergency department for abdominal pain nausea vomiting.   Estimated Nutritional Needs:   Kcal:  1200-1400kcal/day   Protein:  53-61g/day   Fluid:  >1.2L/day      GI:  Endo:  Insulin requirements in the past 24 hours: 0 Lytes: Renal: Pulm: Cards:  Hepatobil: Neuro: ID:  TPN Access: TPN start date: Nutritional Goals (per RD recommendation on 2/14): Estimated Nutritional Needs:   Kcal:  1200-1400kcal/day   Protein:  53-61g/day   Fluid:  >1.2L/day    Goal TPN rate is 55 ml/hr   Current Nutrition:   Plan:  Clindamix 5/15e @ 40cc/hr tonight day 1  Lipid 20% emulsion over 12hrs at 15cc/hr  Electrolytes in TPN: yes MVI, Trace elements, and thiamine 100mg  added to TPN per RD  Monitor TPN labs, 2/15  F/U 2/15   Haley [name] 09/16/2017,3:03 PM

## 2017-09-16 NOTE — Progress Notes (Signed)
**Note De-Identified Haley [name]** She reports no abdominal pain today.  Chief complaint is being sleeping.  She has been sipping some water.  She reports no nausea.  She has been passing some flatus per rectum but no bowel movement.  She has been walking in the hallway.  I examined her earlier this morning and found she was awake alert and oriented.  Abdomen soft and flat with minimal degree of left lower quadrant tenderness.  White blood count is down to 6900.  Hemoglobin 12.0, potassium 3.5, phosphorus 2.9, magnesium 1.6  Diagnosis diverticulitis with contained perforation with some symptomatic improvement.  We have obtain some Ensure for her to start drinking to see if she tolerates it.  I have discussed with her she does not tolerate Ensure we may need to plan PICC line to start TPN

## 2017-09-16 NOTE — Progress Notes (Signed)
**Note De-Identified [place] Obfuscation** Bleeding from PICC insertion site.  Pressure dressing applied to site.  Recommend leave drsg. On for 24 hours before changing.  If bleed through dressing please contact IV team

## 2017-09-16 NOTE — Progress Notes (Signed)
**Note De-Identified [place] Obfuscation** Peripherally Inserted Central Catheter/Midline Placement  The IV Nurse has discussed with the patient and/or persons authorized to consent for the patient, the purpose of this procedure and the potential benefits and risks involved with this procedure.  The benefits include less needle sticks, lab draws from the catheter, and the patient may be discharged home with the catheter. Risks include, but not limited to, infection, bleeding, blood clot (thrombus formation), and puncture of an artery; nerve damage and irregular heartbeat and possibility to perform a PICC exchange if needed/ordered by physician.  Alternatives to this procedure were also discussed.  Bard Power PICC patient education guide, fact sheet on infection prevention and patient information card has been provided to patient /or left at bedside.    PICC/Midline Placement Documentation  PICC Double Lumen 09/16/17 PICC Brachial 36 cm 0 cm (Active)  Indication for Insertion or Continuance of Line Administration of hyperosmolar/irritating solutions (i.e. TPN, Vancomycin, etc.) 09/16/2017  3:49 PM  Exposed Catheter (cm) 0 cm 09/16/2017  3:49 PM  Site Assessment Clean;Dry;Intact 09/16/2017  3:49 PM  Lumen #1 Status Flushed;Saline locked;Blood return noted 09/16/2017  3:49 PM  Lumen #2 Status Flushed;Saline locked;Blood return noted 09/16/2017  3:49 PM  Dressing Type Transparent 09/16/2017  3:49 PM  Dressing Status Clean;Antimicrobial disc in place;Intact;Dry 09/16/2017  3:49 PM  Dressing Change Due 09/23/17 09/16/2017  3:49 PM       Gordan Payment 09/16/2017, 3:50 PM

## 2017-09-16 NOTE — Progress Notes (Signed)
**Note De-Identified [place] Obfuscation** I have visited her several times today.  She did successfully drink a container of Ensure but later did not tolerate clear liquids very well and developed some nausea.  Her heartburn is resolved with treatment.  She has passed some gas and a small amount of mucus but no bowel movement yet  I reviewed the dietitian notes and pharmacy notes.  Also have discussed a plan for insertion of PICC line and begin central hyperalimentation for nutritional support.  Dx : diverticulitis

## 2017-09-16 NOTE — Progress Notes (Signed)
**Note De-Identified [place] Obfuscation** Nutrition Follow Up Note   DOCUMENTATION CODES:   Severe malnutrition in context of acute illness/injury  INTERVENTION:   Clinimix 5/15 with electrolytes at 31m/hr + 20% lipids _0 /hr x 12 hrs/day (Goal rate 532mhr once labs stable)  Regimen at goal provides 1297kcal/day, 66g/day protein, 150078molume  Add MVI daily   Add IV thiamine 100m46mily x 3 days  Pt at high refeeding risk; recommend check P, K, and Mg daily for 3 days after TPN iniatition.   Daily weights  Recommend decrease IVF rate to 60ml85m NUTRITION DIAGNOSIS:   Severe Malnutrition related to acute illness as evidenced by mild fat depletion.  GOAL:   Patient will meet greater than or equal to 90% of their needs  -not met  MONITOR:   Diet advancement, Labs, Weight trends, I & O's, Other (Comment)(TPN)  ASSESSMENT:   57 y.71 female with a past medical history of diverticulitis twice over the past 6 months, anxiety, CHF, chronic pain, COPD, gastric reflux, hypertension, presents to the emergency department for abdominal pain nausea vomiting.   Pt initiated on clear liquid diet today; did not tolerate. Spoke to MD, plan is to initiate TPN. Pt likely at refeed risk; recommend monitor K, Mg, and P. Daily weights.     Medications reviewed and include: colace, remeron, protonix, miralax, NaCl w/ 5% dextrose + KCl _1 /hr, zosyn, zofran  Labs reviewed: K 3.5 wnl, P 2.9 wnl, Mg 1.6(L)  Wbc- 12.1(H)- 2/12  Diet Order:  Diet clear liquid Room service appropriate? Yes; Fluid consistency: Thin .TPN (CLINIMIX-E) Adult  EDUCATION NEEDS:   Education needs have been addressed  Skin: Reviewed RN Assessment  Last BM:  2/7  Height:   Ht Readings from Last 1 Encounters:  09/14/17 _2  (1.499 m)    Weight:   Wt Readings from Last 1 Encounters:  09/14/17 89 lb (40.4 kg)    Ideal Body Weight:  44.5 kg  BMI:  Body mass index is 17.98 kg/m.  Estimated Nutritional Needs:   Kcal:   1200-1400kcal/day   Protein:  53-61g/day   Fluid:  >1.2L/day   CaseyKoleen DistanceRD, LDN Pager #- [phone] Hours Pager: [phone]

## 2017-09-17 LAB — COMPREHENSIVE METABOLIC PANEL
ALK PHOS: 56 U/L (ref 38–126)
ALT: 9 U/L — AB (ref 14–54)
AST: 15 U/L (ref 15–41)
Albumin: 2.7 g/dL — ABNORMAL LOW (ref 3.5–5.0)
Anion gap: 6 (ref 5–15)
BUN: 6 mg/dL (ref 6–20)
CHLORIDE: 104 mmol/L (ref 101–111)
CO2: 23 mmol/L (ref 22–32)
CREATININE: 0.77 mg/dL (ref 0.44–1.00)
Calcium: 8.5 mg/dL — ABNORMAL LOW (ref 8.9–10.3)
Glucose, Bld: 485 mg/dL — ABNORMAL HIGH (ref 65–99)
Potassium: 5.7 mmol/L — ABNORMAL HIGH (ref 3.5–5.1)
SODIUM: 133 mmol/L — AB (ref 135–145)
Total Bilirubin: 0.5 mg/dL (ref 0.3–1.2)
Total Protein: 5.9 g/dL — ABNORMAL LOW (ref 6.5–8.1)

## 2017-09-17 LAB — GLUCOSE, CAPILLARY
GLUCOSE-CAPILLARY: 98 mg/dL (ref 65–99)
Glucose-Capillary: 86 mg/dL (ref 65–99)
Glucose-Capillary: 113 mg/dL — ABNORMAL HIGH (ref 65–99)

## 2017-09-17 LAB — MAGNESIUM: Magnesium: 1.6 mg/dL — ABNORMAL LOW (ref 1.7–2.4)

## 2017-09-17 LAB — POTASSIUM: Potassium: 4 mmol/L (ref 3.5–5.1)

## 2017-09-17 LAB — PHOSPHORUS: Phosphorus: 3.3 mg/dL (ref 2.5–4.6)

## 2017-09-17 MED ORDER — INSULIN ASPART 100 UNIT/ML ~~LOC~~ SOLN
0.0000 [IU] | Freq: Four times a day (QID) | SUBCUTANEOUS | Status: DC
  Administered 2017-09-18 – 2017-09-19 (×2): 1 [IU] via SUBCUTANEOUS
  Filled 2017-09-17 (×2): qty 1

## 2017-09-17 MED ORDER — MAGNESIUM SULFATE 2 GM/50ML IV SOLN
2.0000 g | Freq: Once | INTRAVENOUS | Status: AC
  Administered 2017-09-17: 2 g via INTRAVENOUS
  Filled 2017-09-17: qty 50

## 2017-09-17 MED ORDER — FAT EMULSION 20 % IV EMUL
250.0000 mL | INTRAVENOUS | Status: AC
  Administered 2017-09-17: 250 mL via INTRAVENOUS
  Filled 2017-09-17: qty 250

## 2017-09-17 MED ORDER — TRACE MINERALS CR-CU-MN-SE-ZN 10-1000-500-60 MCG/ML IV SOLN
INTRAVENOUS | Status: AC
  Administered 2017-09-17: 19:00:00 via INTRAVENOUS
  Filled 2017-09-17: qty 1320

## 2017-09-17 NOTE — Progress Notes (Signed)
**Note De-Identified [place] Obfuscation** Nutrition Follow Up Note   DOCUMENTATION CODES:   Severe malnutrition in context of acute illness/injury  INTERVENTION:   Change to Clinimix 5/15 without electrolytes to goal rate of 51ml/hr + 20% lipids @15ml /hr x 12 hrs/day   Regimen at goal provides 1297kcal/day, 66g/day protein, 1548ml volume  Continue MVI daily   Continue IV thiamine 100mg  daily x 2 more days  Pt at high refeeding risk; monitor P, K, and Mg daily until labs stabilize   Daily weights  Decrease IVF rate to 58ml/hr per MD  NUTRITION DIAGNOSIS:   Severe Malnutrition related to acute illness as evidenced by energy intake < or equal to 50% for > or equal to 5 days, mild fat depletion, moderate muscle depletion.  GOAL:   Patient will meet greater than or equal to 90% of their needs  -progressing with TPN  MONITOR:   Diet advancement, Labs, Weight trends, I & O's, Other (Comment)(TPN)  ASSESSMENT:   57 y.o. female with a past medical history of diverticulitis twice over the past 6 months, anxiety, CHF, chronic pain, COPD, gastric reflux, hypertension, presents to the emergency department for abdominal pain nausea vomiting.   Pt tolerating TPN well; increase to goal rate today. Potassium elevated; change to Clinimix without electrolytes. Per MD, decrease IVF rate to 78ml/hr. Add daily weights. Glucose elevated today; sliding scale insulin per pharmacy.   Medications reviewed and include: colace, remeron, protonix, miralax, NaCl w/ 5% dextrose + KCl @100ml /hr, zosyn, zofran  Labs reviewed: Na 133(L), K 5.7(H), Ca 8.5(L) adj. 9.54 wnl, P 3.3 wnl, Mg 1.6(L), alb 2.7(L)  Glucose- 485(H)- 2/15  Diet Order:  Diet clear liquid Room service appropriate? Yes; Fluid consistency: Thin .TPN (CLINIMIX-E) Adult  EDUCATION NEEDS:   Education needs have been addressed  Skin: Reviewed RN Assessment  Last BM:  2/7  Height:   Ht Readings from Last 1 Encounters:  09/14/17 4\' 11"  (1.499 m)    Weight:    Wt Readings from Last 1 Encounters:  09/14/17 89 lb (40.4 kg)    Ideal Body Weight:  44.5 kg  BMI:  Body mass index is 17.98 kg/m.  Estimated Nutritional Needs:   Kcal:  1200-1400kcal/day   Protein:  53-61g/day   Fluid:  >1.2L/day   Koleen Distance MS, RD, LDN Pager #[phone] After Hours Pager: [phone]

## 2017-09-17 NOTE — Progress Notes (Signed)
**Note De-Identified [place] [facility]** PHARMACY - ADULT TOTAL PARENTERAL NUTRITION CONSULT NOTE   Pharmacy Consult for TPN Indication: malnutrition  Patient Measurements: Height: 4\' 11"  (149.9 cm) Weight: 89 lb (40.4 kg) IBW/kg (Calculated) : 43.2 TPN AdjBW (KG): 40.4 Body mass index is 17.98 kg/m.   Assessment: 57 y.o. female with a past medical history of diverticulitis twice over the past 6 months, anxiety, CHF, chronic pain, COPD, gastric reflux, hypertension, presents to the emergency department for abdominal pain nausea vomiting.   Estimated Nutritional Needs:   Kcal:  1200-1400kcal/day   Protein:  53-61g/day   Fluid:  >1.2L/day      GI:  Endo: FSBS has been significantly elevated, 485 2/15@0645 . Will be initiating sensitive sliding scale insulin q6h at this time and will monitor. D5 1/4NS w 37meq KCl has been decreased from 100cc/hr to 40cc/hr per MD.  Evaristo Bury: Renal: Pulm: Cards:  Hepatobil: Neuro: ID:  TPN Access: TPN start date: Nutritional Goals (per RD recommendation on 2/14): Estimated Nutritional Needs:   Kcal:  1200-1400kcal/day   Protein:  53-61g/day   Fluid:  >1.2L/day    Goal TPN rate is 55 ml/hr   Current Nutrition:   Plan: Remove electrolytes from TPN, due to K 5.7  Mg 1.6, will give magnesium sulfate 2gm iv x 1 dose Clindamix 5/15 @ 55cc/hr tonight day 2  Decrease D51/4NS14meq KCl, Initiating sensitive sliding scale insulin q6h at this time Lipid 20% emulsion over 12hrs at 15cc/hr  Electrolytes in TPN: no MVI, Trace elements, and thiamine 100mg  added to TPN per RD  Monitor TPN labs, 2/16  F/U 2/16  Shery Wauneka 09/17/2017,9:49 AM

## 2017-09-17 NOTE — Progress Notes (Signed)
**Note De-Identified [place] Obfuscation** Her chief complaint today is feeling sleepy.  She said that she did not sleep much last night but took lorazepam at 4:00 in the morning and trying to sleep some during the daytime.  She has however been doing some walking in the hallway.  Yesterday she did take 1 can of Ensure and a minimal amount of clear liquids.  She did not tolerate her clear liquid diet and subsequently had PICC line inserted in the right arm.  She has been started on hyperalimentation.  She reports no significant oral intake today.  She reports no nausea today.  She has been passing gas per rectum but no bowel movement.  She reports no abdominal pain today.  Vital signs are stable.  She is awake and oriented slightly drowsy.  Lung sounds are clear.  Dressing of the right upper arm appears dry with some blood staining.  Abdomen is soft flat and with minimal deep tenderness in the left lower quadrant.  No guarding.  No palpable mass.  She had comprehensive metabolic panel at 6:46 AM and according to that her blood sugar was 485 however fingerstick glucose at 11:31 AM was 86.  Diagnosis is diverticulitis with obstipation possible partial colonic obstruction with improvement in symptoms  I advised her that she should not take  solid food yet until she has a good bowel movement.  Continue to offer Ensure and some clear liquids.  She has taken MiraLAX.  Encourage continued walking in the hallway.  Encouraged taking lorazepam at bedtime to see if she can sleep at nighttime

## 2017-09-18 LAB — GLUCOSE, CAPILLARY
GLUCOSE-CAPILLARY: 119 mg/dL — AB (ref 65–99)
Glucose-Capillary: 114 mg/dL — ABNORMAL HIGH (ref 65–99)
Glucose-Capillary: 116 mg/dL — ABNORMAL HIGH (ref 65–99)
Glucose-Capillary: 138 mg/dL — ABNORMAL HIGH (ref 65–99)

## 2017-09-18 LAB — BASIC METABOLIC PANEL
ANION GAP: 8 (ref 5–15)
BUN: 15 mg/dL (ref 6–20)
CHLORIDE: 106 mmol/L (ref 101–111)
CO2: 25 mmol/L (ref 22–32)
Calcium: 9.1 mg/dL (ref 8.9–10.3)
Creatinine, Ser: 0.73 mg/dL (ref 0.44–1.00)
GFR calc Af Amer: >60 mL/min (ref >60)
Glucose, Bld: 122 mg/dL — ABNORMAL HIGH (ref 65–99)
POTASSIUM: 4 mmol/L (ref 3.5–5.1)
SODIUM: 139 mmol/L (ref 135–145)

## 2017-09-18 LAB — PHOSPHORUS: Phosphorus: 3.6 mg/dL (ref 2.5–4.6)

## 2017-09-18 LAB — MAGNESIUM: Magnesium: 2 mg/dL (ref 1.7–2.4)

## 2017-09-18 MED ORDER — FAT EMULSION 20 % IV EMUL
250.0000 mL | INTRAVENOUS | Status: AC
  Administered 2017-09-18: 250 mL via INTRAVENOUS
  Filled 2017-09-18: qty 250

## 2017-09-18 MED ORDER — THIAMINE HCL 100 MG/ML IJ SOLN: INTRAVENOUS | Status: DC

## 2017-09-18 MED ORDER — FAT EMULSION 20 % IV EMUL: 250.0000 mL | INTRAVENOUS | Status: DC

## 2017-09-18 MED ORDER — TRACE MINERALS CR-CU-MN-SE-ZN 10-1000-500-60 MCG/ML IV SOLN
INTRAVENOUS | Status: AC
  Administered 2017-09-18: 18:00:00 via INTRAVENOUS
  Filled 2017-09-18: qty 1320

## 2017-09-18 NOTE — Progress Notes (Addendum)
**Note De-Identified [place] [facility]** PHARMACY - ADULT TOTAL PARENTERAL NUTRITION CONSULT NOTE   Pharmacy Consult for TPN Indication: malnutrition  Patient Measurements: Height: 4\' 11"  (149.9 cm) Weight: 96 lb 4.8 oz (43.7 kg) IBW/kg (Calculated) : 43.2 TPN AdjBW (KG): 40.4 Body mass index is 19.45 kg/m.   Assessment: 57 y.o. female with a past medical history of diverticulitis twice over the past 6 months, anxiety, CHF, chronic pain, COPD, gastric reflux, hypertension, presents to the emergency department for abdominal pain nausea vomiting.   Estimated Nutritional Needs:   Kcal:  1200-1400kcal/day   Protein:  53-61g/day   Fluid:  >1.2L/day      GI:  Endo:  1 unit of SSI  Lytes: all wnl Renal: Pulm: Cards:  Hepatobil: Neuro: ID:  TPN Access: TPN start date: Nutritional Goals (per RD recommendation on 2/14): Estimated Nutritional Needs:   Kcal:  1200-1400kcal/day   Protein:  53-61g/day   Fluid:  >1.2L/day    Goal TPN rate is 55 ml/hr   Current Nutrition:   Plan: Will continue Clinimix 5/15 @ 55 ml/hr Lipid 20% emulsion over 12hrs at 15cc/hr  Electrolytes in TPN: no MVI, Trace elements, and thiamine 100mg  added to TPN per RD  Monitor TPN labs, 2/17 F/U 2/17  Windsor Zirkelbach D 09/18/2017,9:40 AM

## 2017-09-18 NOTE — Progress Notes (Signed)
**Note De-Identified [place] [facility]uscation** Her chief complaint today is drowsiness.  She reports that yesterday she did do a lot of walking in the hallway.  She has not been out of bed yet today but says she is going to get up soon.  She reports she did drink some Ensure yesterday.  She also had small amounts of clear liquids yesterday.  She reports no nausea or vomiting.  She reports she did have 3 small formed bowel movements yesterday.  She does continue to pass gas per rectum.  She reports no chills or fever.  She reports satisfactorily.  She reports no current abdominal pain.  Her past history and medicines were again reviewed.  She continues on intravenous Zosyn.  On examination vital signs are stable.  She is awake alert and oriented.  She is breathing satisfactorily.  Lung sounds are clear.  Abdomen is soft and flat with minimal degree of left lower quadrant tenderness and no palpable mass.  Clinical data: Blood sugars are slightly elevated but satisfactory for central venous nutrition.  Magnesium is 2.0 and phosphorus 3.6, creatinine 0.73  Impression diverticulitis with symptomatic improvement.  Plan is to continue with clear liquids and Ensure and MiraLAX.  If she makes additional progress with oral intake we can begin to taper the central venous nutrition.  Continue to encourage frequent walking in the hallway.  I have encouraged her to try to sleep less during the daytime and hopefully more at night.  Continue intravenous Zosyn.  I have advised her I think that at some point she may need to have her sigmoid colon removed.  Ideally we will get her over this illness and can later have elective surgery.

## 2017-09-19 LAB — GLUCOSE, CAPILLARY
GLUCOSE-CAPILLARY: 115 mg/dL — AB (ref 65–99)
Glucose-Capillary: 110 mg/dL — ABNORMAL HIGH (ref 65–99)
Glucose-Capillary: 121 mg/dL — ABNORMAL HIGH (ref 65–99)
Glucose-Capillary: 106 mg/dL — ABNORMAL HIGH (ref 65–99)

## 2017-09-19 LAB — BASIC METABOLIC PANEL
Anion gap: 8 (ref 5–15)
BUN: 20 mg/dL (ref 6–20)
CO2: 23 mmol/L (ref 22–32)
Calcium: 8.9 mg/dL (ref 8.9–10.3)
Chloride: 105 mmol/L (ref 101–111)
Creatinine, Ser: 0.66 mg/dL (ref 0.44–1.00)
GFR calc non Af Amer: >60 mL/min (ref >60)
Glucose, Bld: 114 mg/dL — ABNORMAL HIGH (ref 65–99)
Potassium: 3.8 mmol/L (ref 3.5–5.1)
Sodium: 136 mmol/L (ref 135–145)

## 2017-09-19 LAB — PHOSPHORUS: PHOSPHORUS: 2.5 mg/dL (ref 2.5–4.6)

## 2017-09-19 LAB — MAGNESIUM: Magnesium: 1.8 mg/dL (ref 1.7–2.4)

## 2017-09-19 MED ORDER — FAT EMULSION 20 % IV EMUL
250.0000 mL | INTRAVENOUS | Status: DC
  Filled 2017-09-19: qty 250

## 2017-09-19 MED ORDER — TRACE MINERALS CR-CU-MN-SE-ZN 10-1000-500-60 MCG/ML IV SOLN
INTRAVENOUS | Status: DC
  Filled 2017-09-19: qty 1320

## 2017-09-19 MED ORDER — M.V.I. ADULT IV INJ
INJECTION | INTRAVENOUS | Status: DC
Start: 2017-09-19 — End: 2017-09-20
  Administered 2017-09-19: 19:00:00 via INTRAVENOUS
  Filled 2017-09-19: qty 960

## 2017-09-19 MED ORDER — FAT EMULSION 20 % IV EMUL
250.0000 mL | INTRAVENOUS | Status: DC
  Administered 2017-09-19: 250 mL via INTRAVENOUS
  Filled 2017-09-19: qty 250

## 2017-09-19 NOTE — Progress Notes (Signed)
**Note De-Identified [place] [facility]bfuscation** Chief complaint today is wanting to sleep more.  She reports no abdominal pain.  She reports she took 2 cans of Ensure yesterday and also took some clear liquids still in small amounts.  She had minimal degree of nausea yesterday.  She reports she had a bowel movement on 4 occasions yesterday somewhat more bulky than the day before.  She is voiding satisfactorily.  She reports ambulating better in the hallway.  She reports she is breathing satisfactorily.  Past history and current medicines reviewed.  On examination vital signs are stable.  She is awake alert and oriented resting in bed.  She is breathing easily.  Lung sounds are clear.  Abdomen is soft and flat with minimal the left lower quadrant tenderness.  No palpable mass.  PICC line dressing dry.  Extremities with no dependent edema  Metabolic B panel is with a glucose of 114.  Phosphorus is 2.5 and magnesium 1.8  Impression: Diverticulitis with improvement  Plan is to convert peripheral IV to saline lock.  Advance to full liquid diet continue Ensure.  Can begin to taper TPN.  Try to increase frequency and distance for walking.  Continue Zosyn and MiraLAX.

## 2017-09-19 NOTE — Progress Notes (Addendum)
**Note De-Identified [place] [facility]** PHARMACY - ADULT TOTAL PARENTERAL NUTRITION CONSULT NOTE   Pharmacy Consult for TPN Indication: malnutrition  Patient Measurements: Height: 4\' 11"  (149.9 cm) Weight: 95 lb 9.6 oz (43.4 kg) IBW/kg (Calculated) : 43.2 TPN AdjBW (KG): 40.4 Body mass index is 19.31 kg/m.   Assessment: 57 y.o. female with a past medical history of diverticulitis twice over the past 6 months, anxiety, CHF, chronic pain, COPD, gastric reflux, hypertension, presents to the emergency department for abdominal pain nausea vomiting.   Endo:  No correctional insulin used in past 24 hours Lytes: all wnl Renal: Pulm: Cards:  Hepatobil: Neuro: ID:  TPN Access: 2/14 TPN start date: 2/14 Nutritional Goals  Estimated Nutritional Needs:   Kcal:  1200-1400kcal/day   Protein:  53-61g/day   Fluid:  >1.2L/day    Goal TPN rate is 55 ml/hr   Current Nutrition: Clinimix 5/15 without electrolytes @ 55 mL/hr per RD recommendation  Plan: Will begin to taper TPN today per discussion with RD Decrease rate of Clinimix 5/15 without electrolytes to 40 mL/hr Lipid 20% emulsion over 12hrs at 15 mL/hr  Electrolytes WNL, no supplementation necessary at this time MVI, Trace elements added to TPN Continue SSI insulin q6h  Will recheck electrolytes with AM labs tomorrow.  Lenis Noon, PharmD, BCPS Clinical Pharmacist 09/19/2017,10:53 AM

## 2017-09-20 LAB — CBC WITH DIFFERENTIAL/PLATELET
Basophils Absolute: 0.1 10*3/uL (ref 0–0.1)
Basophils Relative: 1 %
EOS ABS: 0.4 10*3/uL (ref 0–0.7)
Eosinophils Relative: 4 %
HCT: 39.9 % (ref 35.0–47.0)
HEMOGLOBIN: 13.4 g/dL (ref 12.0–16.0)
LYMPHS ABS: 2.1 10*3/uL (ref 1.0–3.6)
LYMPHS PCT: 18 %
MCH: 31 pg (ref 26.0–34.0)
MCHC: 33.6 g/dL (ref 32.0–36.0)
MCV: 92.1 fL (ref 80.0–100.0)
Monocytes Absolute: 0.9 10*3/uL (ref 0.2–0.9)
Monocytes Relative: 7 %
Neutro Abs: 8.2 10*3/uL — ABNORMAL HIGH (ref 1.4–6.5)
Neutrophils Relative %: 70 %
Platelets: 383 10*3/uL (ref 150–440)
RBC: 4.33 MIL/uL (ref 3.80–5.20)
RDW: 14.7 % — ABNORMAL HIGH (ref 11.5–14.5)
WBC: 11.7 10*3/uL — AB (ref 3.6–11.0)

## 2017-09-20 LAB — PHOSPHORUS: PHOSPHORUS: 3 mg/dL (ref 2.5–4.6)

## 2017-09-20 LAB — MAGNESIUM: MAGNESIUM: 1.8 mg/dL (ref 1.7–2.4)

## 2017-09-20 LAB — BASIC METABOLIC PANEL
Anion gap: 9 (ref 5–15)
BUN: 18 mg/dL (ref 6–20)
CALCIUM: 9 mg/dL (ref 8.9–10.3)
CHLORIDE: 108 mmol/L (ref 101–111)
CO2: 22 mmol/L (ref 22–32)
CREATININE: 0.72 mg/dL (ref 0.44–1.00)
GFR calc non Af Amer: >60 mL/min (ref >60)
Glucose, Bld: 127 mg/dL — ABNORMAL HIGH (ref 65–99)
Potassium: 2.9 mmol/L — ABNORMAL LOW (ref 3.5–5.1)
SODIUM: 139 mmol/L (ref 135–145)

## 2017-09-20 LAB — GLUCOSE, CAPILLARY
GLUCOSE-CAPILLARY: 121 mg/dL — AB (ref 65–99)
GLUCOSE-CAPILLARY: 111 mg/dL — AB (ref 65–99)
Glucose-Capillary: 105 mg/dL — ABNORMAL HIGH (ref 65–99)
Glucose-Capillary: 110 mg/dL — ABNORMAL HIGH (ref 65–99)

## 2017-09-20 MED ORDER — FAT EMULSION 20 % IV EMUL: 250.0000 mL | INTRAVENOUS | Status: DC

## 2017-09-20 MED ORDER — POTASSIUM CHLORIDE 10 MEQ/50ML IV SOLN
10.0000 meq | INTRAVENOUS | Status: AC
  Administered 2017-09-20 (×5): 10 meq via INTRAVENOUS
  Filled 2017-09-20 (×5): qty 50

## 2017-09-20 MED ORDER — POLYETHYLENE GLYCOL 3350 17 G PO PACK: 17.0000 g | PACK | Freq: Every day | ORAL | 6 refills | Status: DC

## 2017-09-20 MED ORDER — AMOXICILLIN-POT CLAVULANATE 500-125 MG PO TABS
1.0000 | ORAL_TABLET | Freq: Two times a day (BID) | ORAL | Status: DC
  Administered 2017-09-20: 500 mg via ORAL
  Filled 2017-09-20: qty 1

## 2017-09-20 MED ORDER — AMOXICILLIN-POT CLAVULANATE 500-125 MG PO TABS: 1.0000 | ORAL_TABLET | Freq: Two times a day (BID) | ORAL | 0 refills | Status: DC

## 2017-09-20 MED ORDER — TRACE MINERALS CR-CU-MN-SE-ZN 10-1000-500-60 MCG/ML IV SOLN: INTRAVENOUS | Status: DC

## 2017-09-20 NOTE — Progress Notes (Signed)
**Note De-Identified [place] [facility]bfuscation** Pt discharged per MD order. Pt discharged with PICC in place per Dr. Bary Castilla. Discharge instructions given to pt. Pt instructed to call MD office tomorrow. Prescription sent electronically to pts pharmacy per MD, pt aware. IV removed. Pt declined wheelchair and walked downstairs.

## 2017-09-20 NOTE — Progress Notes (Signed)
**Note De-Identified [place] [facility]** PHARMACY - ADULT TOTAL PARENTERAL NUTRITION CONSULT NOTE   Pharmacy Consult for TPN Indication: malnutrition  Patient Measurements: Height: 4\' 11"  (149.9 cm) Weight: 95 lb 7.4 oz (43.3 kg) IBW/kg (Calculated) : 43.2 TPN AdjBW (KG): 40.4 Body mass index is 19.28 kg/m.   Assessment: 57 y.o. female with a past medical history of diverticulitis twice over the past 6 months, anxiety, CHF, chronic pain, COPD, gastric reflux, hypertension, presents to the emergency department for abdominal pain nausea vomiting.   Endo:  No correctional insulin used in past 24 hours Lytes: all wnl Renal: Pulm: Cards:  Hepatobil: Neuro: ID:  TPN Access: 2/14 TPN start date: 2/14 Nutritional Goals  Estimated Nutritional Needs:   Kcal:  1200-1400kcal/day   Protein:  53-61g/day   Fluid:  >1.2L/day    Goal TPN rate is 55 ml/hr   Current Nutrition: Clinimix 5/15 without electrolytes @ 55 mL/hr per RD recommendation  Plan: Continue with TPN at reduced rate of 40 ml/hr for now Decrease rate of Clinimix 5/15 without electrolytes to 40 mL/hr Lipid 20% emulsion over 12hrs at 15 mL/hr  Will discuss with MD if patient is ready to be tapered completely off K=2.9 KCL 10 MEQ IV x 5. Recheck K in the AM MVI, Trace elements added to TPN Continue SSI insulin q6h  Will recheck electrolytes with AM labs tomorrow.  Ramond Dial, PharmD, BCPS Clinical Pharmacist 09/20/2017,3:20 PM

## 2017-09-21 ENCOUNTER — Telehealth: Payer: Self-pay

## 2017-09-21 NOTE — Telephone Encounter (Signed)
**Note De-Identified [place] [facility]** Patient had urgent discharge as her father was failing fast from metastatic prostate cancer and had taken a turn for the worse. Will RX with Augmentin and see how she is doing next week.  Will arrange for CBC with diff next week with OV to follow.

## 2017-09-21 NOTE — Telephone Encounter (Signed)
**Note De-Identified [place] [facility]ation** Patient called with a progress report. She states that she is doing well. She has no pain and has been using the bathroom with out problems. She is on her liquid diet and has filled her prescriptions.

## 2017-09-22 ENCOUNTER — Telehealth: Payer: Self-pay

## 2017-09-22 ENCOUNTER — Other Ambulatory Visit: Payer: Self-pay

## 2017-09-22 DIAGNOSIS — K5732 Diverticulitis of large intestine without perforation or abscess without bleeding: Secondary | ICD-10-CM

## 2017-09-22 NOTE — Telephone Encounter (Signed)
**Note De-Identified [place] [facility]ation** Patient called and said that she has been having some left lower abdominal pain for the past 2 hours. The pain is not bad, but noticeable. She also states that she has had a low grade fever 99-100 also. No nausea, vomiting, or diarrhea. She is taking her Augmentin as prescribed.  Spoke with Dr Bary Castilla and he advises the patient to monitor symptoms and call tomorrow with an update on how she is doing. She is agreeable to this.

## 2017-09-22 NOTE — Telephone Encounter (Signed)
**Note De-Identified [place] [facility]bfuscation** Transition Care Management Follow-up Telephone Call   Date discharged? 09/20/2017   How have you been since you were released from the hospital? "fine, taking my antibiotic as prescribed and the miralax drink. Just dealing with a lot because of my dad passing"   Do you understand why you were in the hospital? yes   Do you understand the discharge instructions? yes   Where were you discharged to? home   Items Reviewed:  Medications reviewed: yes  Allergies reviewed: yes  Dietary changes reviewed: yes  Referrals reviewed: yes   Functional Questionnaire:   Activities of Daily Living (ADLs):   She states they are independent in the following: ambulation, bathing and hygiene, feeding, continence, grooming, toileting and dressing States they require assistance with the following: none   Any transportation issues/concerns?: no   Any patient concerns? Yes, requesting refill on ativan or something for anxiety that will not make her as sleepy, as she is with her dad currently who is passing.    Confirmed importance and date/time of follow-up visits scheduled yes  Provider Appointment booked with Regino Schultze on 09/28/2017 at 3:45pm  Confirmed with patient if condition begins to worsen call PCP or go to the ER.  Patient was given the office number and encouraged to call back with question or concerns.  : yes

## 2017-09-22 NOTE — Telephone Encounter (Signed)
**Note De-Identified [place] [facility]uscation** Message left for patient to call back office to schedule labs and follow up.

## 2017-09-22 NOTE — Telephone Encounter (Signed)
**Note De-Identified [place]i [facility]bfuscation** Patient will be following up on 09/29/17 at 2:00 pm. She will have her labs done at [facility] prior to her visit.

## 2017-09-23 NOTE — Telephone Encounter (Signed)
**Note De-Identified [place] [facility]** Tried calling patient to find out about medication. She did not answer and VM box was full so I could not leave a VM. Will try to call again later.

## 2017-09-23 NOTE — Telephone Encounter (Signed)
**Note De-identified [place] [facility]bfuscation** Routing to provider  

## 2017-09-23 NOTE — Telephone Encounter (Signed)
**Note De-Identified [place] [facility]bfuscation** Is she asking for her Lorazepam to be reordered?

## 2017-09-24 NOTE — Telephone Encounter (Signed)
**Note De-Identified [place] [facility]bfuscation** Tried calling patient to find out about medication. Patient did not answer and I could not leave a VM because box was full. Will try to call again later.

## 2017-09-26 NOTE — Discharge Summary (Signed)
**Note De-Identified [place] [facility]ation** Physician Discharge Summary  Patient ID: Haley [name] MRN: [id number] DOB/AGE: [birth year] 57 y.o.  Admit date: 09/14/2017 Discharge date: 09/26/2017  Admission Diagnoses:  Discharge Diagnoses:  Active Problems:   Diverticulitis large intestine   Protein-calorie malnutrition, severe   Discharged Condition: fair  Hospital Course: This 57 year old woman was admitted with a recurrent episode of diverticulitis.  She was started on IV antibiotics and had defervesced since of her symptoms.  She was being changed to oral antibiotics when an urgent decline in her father's health prompted an accelerated discharge.  The patient had experienced left lower quadrant pain which was improving at discharge although not completely resolved.  Had tolerated a diet advance with minimal abdominal discomfort.  Consults: None  Significant Diagnostic Studies: CT imaging  Treatments: IV hydration and antibiotics:    Discharge Exam: Blood pressure 114/81, pulse 98, temperature 98.4 F (36.9 C), temperature source Oral, resp. rate 19, height 4\' 11"  (1.499 m), weight 95 lb 7.4 oz (43.3 kg), SpO2 96 %. General appearance: alert and cooperative Resp: clear to auscultation bilaterally Cardio: regular rate and rhythm, S1, S2 normal, no murmur, click, rub or gallop GI: Mild left lower quadrant discomfort without peritoneal irritation, guarding or referred pain.  Disposition: 01-Home or Self Care  Discharge Instructions    Diet - low sodium heart healthy   Complete by:  As directed    Discharge instructions   Complete by:  As directed    Diet as tolerated.  Avoid raw fruits and vegetables. Augmentin, 500/ 125: Twice a day starting in the morning. Miralax 1 packet or 1 capful in 8 oz water/ juice daily.  Call in AM with progress report. [phone]   Increase activity slowly   Complete by:  As directed      Allergies as of 09/20/2017      Reactions   Doxycycline Nausea And Vomiting   Sulfur Diarrhea,  Nausea And Vomiting      Medication List    TAKE these medications   amoxicillin-clavulanate 500-125 MG tablet Commonly known as:  AUGMENTIN Take 1 tablet (500 mg total) by mouth 2 (two) times daily.   docusate sodium 100 MG capsule Commonly known as:  COLACE Take 1 tablet once or twice daily as needed for constipation while taking narcotic pain medicine   Glycopyrrolate-Formoterol 9-4.8 MCG/ACT Aero Commonly known as:  BEVESPI AEROSPHERE Inhale 2 puffs into the lungs 2 (two) times daily.   HYDROcodone-acetaminophen 5-325 MG tablet Commonly known as:  NORCO/VICODIN Take 1-2 tablets by mouth every 6 (six) hours as needed for moderate pain.   LORazepam 1 MG tablet Commonly known as:  ATIVAN TAKE 1 TABLET BY MOUTH ONCE DAILY AS NEEDED FOR ANXIETY   meloxicam 7.5 MG tablet Commonly known as:  MOBIC Take 1 tablet (7.5 mg total) by mouth daily.   metoprolol succinate 50 MG 24 hr tablet Commonly known as:  TOPROL-XL Take 1 tablet (50 mg total) by mouth daily. Take with or immediately following a meal.   mirtazapine 30 MG tablet Commonly known as:  REMERON Take 1 tablet (30 mg total) by mouth at bedtime.   multivitamin tablet Take 1 tablet by mouth daily.   ondansetron 4 MG disintegrating tablet Commonly known as:  ZOFRAN ODT Allow 1-2 tablets to dissolve in your mouth every 8 hours as needed for nausea/vomiting   polyethylene glycol packet Commonly known as:  MIRALAX / GLYCOLAX Take 17 g by mouth daily.        Signed: Forest Gleason **Note De-Identified Koran Obfuscation** Sheana Bir 09/26/2017, 9:12 AM

## 2017-09-27 ENCOUNTER — Telehealth: Payer: Self-pay | Admitting: General Surgery

## 2017-09-27 NOTE — Telephone Encounter (Signed)
**Note De-Identified [place] Obfuscation** PATIENT CALLED STATING SHE HAS PAIN,NEASUA& BLOATING.sHE IS SCHEDULE TO SEE DR BYRNETT ON 09-29-17 FOR f/u from diverticulitis and post hospitalization.PLEASE CALL HER & ADVISE

## 2017-09-27 NOTE — Telephone Encounter (Signed)
**Note De-Identified [place]ering[place] Obfuscation** Patient to have her labs done tomorrow. The patient is aware to use a heating pad as needed for comfort.. Call and report tomorrow if not better

## 2017-09-28 ENCOUNTER — Inpatient Hospital Stay: Payer: 59 | Admitting: Unknown Physician Specialty

## 2017-09-28 ENCOUNTER — Other Ambulatory Visit
Admission: RE | Admit: 2017-09-28 | Discharge: 2017-09-28 | Disposition: A | Discharge status: Home / Self Care | Payer: 59 | Source: Ambulatory Visit | Attending: General Surgery | Admitting: General Surgery

## 2017-09-28 ENCOUNTER — Other Ambulatory Visit: Payer: Self-pay | Admitting: Unknown Physician Specialty

## 2017-09-28 DIAGNOSIS — K5732 Diverticulitis of large intestine without perforation or abscess without bleeding: Secondary | ICD-10-CM | POA: Insufficient documentation

## 2017-09-28 LAB — CBC WITH DIFFERENTIAL/PLATELET
Basophils Absolute: 0.1 10*3/uL (ref 0–0.1)
Basophils Relative: 1 %
EOS PCT: 1 %
Eosinophils Absolute: 0.1 10*3/uL (ref 0–0.7)
HEMATOCRIT: 39.5 % (ref 35.0–47.0)
Hemoglobin: 13.7 g/dL (ref 12.0–16.0)
LYMPHS ABS: 2.4 10*3/uL (ref 1.0–3.6)
LYMPHS PCT: 30 %
MCH: 31.5 pg (ref 26.0–34.0)
MCHC: 34.6 g/dL (ref 32.0–36.0)
MCV: 91.1 fL (ref 80.0–100.0)
MONO ABS: 0.5 10*3/uL (ref 0.2–0.9)
MONOS PCT: 6 %
NEUTROS ABS: 4.8 10*3/uL (ref 1.4–6.5)
Neutrophils Relative %: 62 %
PLATELETS: 575 10*3/uL — AB (ref 150–440)
RBC: 4.34 MIL/uL (ref 3.80–5.20)
RDW: 14.4 % (ref 11.5–14.5)
WBC: 7.8 10*3/uL (ref 3.6–11.0)

## 2017-09-28 NOTE — Telephone Encounter (Signed)
**Note De-Identified [place]man Obfuscation** Tried calling patient again to find out about medication. Still no answer and VM box still full so I could not leave a VM. Malachy Mood I have attempted to reach this patient 3 times regarding this medication. Is there anything we can do? Looks liked lorazepam was last written in October 2018.

## 2017-09-28 NOTE — Telephone Encounter (Signed)
**Note De-Identified Sivak Obfuscation** I think we can close this.  Thanks

## 2017-09-29 ENCOUNTER — Ambulatory Visit (INDEPENDENT_AMBULATORY_CARE_PROVIDER_SITE_OTHER): Payer: PRIVATE HEALTH INSURANCE | Admitting: General Surgery

## 2017-09-29 ENCOUNTER — Encounter: Payer: Self-pay | Admitting: General Surgery

## 2017-09-29 VITALS — BP 120/78 | HR 99 | Temp 98.3°F | Resp 14 | Ht 59.0 in | Wt 92.0 lb

## 2017-09-29 DIAGNOSIS — K5732 Diverticulitis of large intestine without perforation or abscess without bleeding: Secondary | ICD-10-CM | POA: Diagnosis not present

## 2017-09-29 MED ORDER — METRONIDAZOLE 500 MG PO TABS: ORAL_TABLET | ORAL | 0 refills | Status: DC

## 2017-09-29 MED ORDER — NEOMYCIN SULFATE 500 MG PO TABS: ORAL_TABLET | ORAL | 0 refills | Status: DC

## 2017-09-29 MED ORDER — DICYCLOMINE HCL 10 MG PO CAPS: 10.0000 mg | ORAL_CAPSULE | Freq: Three times a day (TID) | ORAL | 0 refills | Status: DC

## 2017-09-29 MED ORDER — POLYETHYLENE GLYCOL 3350 17 GM/SCOOP PO POWD: ORAL | 0 refills | Status: DC

## 2017-09-29 NOTE — Progress Notes (Signed)
**Note De-Identified Haley [name]** Patient ID: Haley [name], female   DOB: Apr 13, 1961, 57 y.o.   MRN: [id number]  Chief Complaint  Patient presents with  . Follow-up    HPI Haley [name] is a 57 y.o. female.  Here for follow up from hospitalization for diverticulitis. She still admits to diarrhea that comes and goes, brown to slim yellow color. Right arm PICC last used 09-21-17. Last fever was over the weekend 2 days ago. Two days ago she was vomiting. Patient has tried using boost drinks but she get sick to her stomach. Gurgling daily in her belly.   The patient was hospitalized July 14, 2018 with recurrent left lower quadrant pain.  She was on IV antibiotics and transitioning to oral antibiotics when she was discharged urgently to attend her father who died within the next 48 hours.  The patient reports stools varying in quantity and occasionally episodes of incontinence.  She is her with her mother, Haley [name].  HPI  Past Medical History:  Diagnosis Date  . Abdominal pain 04/29/2015  . Acute diverticulitis of intestine 03/18/2017  . Acute pancreatitis 01/13/2017  . Acute sinusitis 09/01/2015  . Anemia   . Anxiety   . Benign neoplasm of sigmoid colon   . Breast cancer screening 07/08/2015  . CHF (congestive heart failure) ([place])    while hospitalized with pneumonia  . Chronic pain syndrome   . COPD (chronic obstructive pulmonary disease) ([place])    while hospitalized  . Cyclic vomiting syndrome   . Cyclical vomiting   . Diverticulitis of large intestine with abscess without bleeding   . Dysphagia   . GERD (gastroesophageal reflux disease)    controlled  . Hematuria, microscopic 04/26/2015   Urine Sept 2016; rechecked still present; refer to urologist; hx of "polyps" in bladder 25 years ago (1991-ish)   . History of pneumonia   . History of sepsis   . Hypertension    controlled on meds  . Hypokalemia 04/24/2015  . IBS (irritable bowel syndrome) 02/02/2017  . Leukocytosis   . Low serum potassium 01/13/2017  .  Marital stress 07/12/2015   Separated from husband Fall 2016   . Moderate COPD (chronic obstructive pulmonary disease) ([place])   . Osteopenia Nov 2015  . Pain due to dental caries 05/09/2015  . Recurrent depressive disorder, current episode moderate ([place]) 04/24/2015  . Severe depression ([place]) 05/12/2016  . Special screening for malignant neoplasms, colon   . Tachycardia   . Thrombocytosis ([place])   . Vitamin D deficiency disease     Past Surgical History:  Procedure Laterality Date  . CESAREAN SECTION  09/27/79, 08/29/83   x 2  . COLONOSCOPY WITH PROPOFOL N/A 01/09/2016   Procedure: COLONOSCOPY WITH PROPOFOL;  Surgeon: Lucilla Lame, MD;  Location: [facility];  Service: Endoscopy;  Laterality: N/A;  . POLYPECTOMY  01/09/2016   Procedure: POLYPECTOMY;  Surgeon: Lucilla Lame, MD;  Location: [facility];  Service: Endoscopy;;    Family History  Problem Relation Age of Onset  . Cancer Father        prostate  . Heart disease Father   . Heart attack Father   . Hypertension Father   . Stroke Neg Hx   . COPD Neg Hx     Social History Social History   Tobacco Use  . Smoking status: Current Every Day Smoker    Packs/day: 1.00    Years: 35.00    Pack years: 35.00    Types: Cigarettes    Last **Note De-Identified Haley [name] [name]** attempt to quit: 08/03/2013    Years since quitting: 4.1  . Smokeless tobacco: Never Used  Substance Use Topics  . Alcohol use: Yes    Alcohol/week: 1.8 oz    Types: 3 Glasses of wine per week    Comment: occasional  . Drug use: No    Allergies  Allergen Reactions  . Doxycycline Nausea And Vomiting  . Sulfur Diarrhea and Nausea And Vomiting    Current Outpatient Medications  Medication Sig Dispense Refill  . amoxicillin-clavulanate (AUGMENTIN) 500-125 MG tablet Take 1 tablet (500 mg total) by mouth 2 (two) times daily. 20 tablet 0  . Glycopyrrolate-Formoterol (BEVESPI AEROSPHERE) 9-4.8 MCG/ACT AERO Inhale 2 puffs into the lungs 2 (two) times daily. 3 Inhaler 3  . LORazepam  (ATIVAN) 1 MG tablet TAKE 1 TABLET BY MOUTH ONCE DAILY AS NEEDED FOR ANXIETY 30 tablet 0  . meloxicam (MOBIC) 7.5 MG tablet Take 1 tablet (7.5 mg total) by mouth daily. 30 tablet 1  . metoprolol succinate (TOPROL-XL) 50 MG 24 hr tablet Take 1 tablet (50 mg total) by mouth daily. Take with or immediately following a meal. 90 tablet 3  . mirtazapine (REMERON) 30 MG tablet Take 1 tablet (30 mg total) by mouth at bedtime. 30 tablet 6  . Multiple Vitamin (MULTIVITAMIN) tablet Take 1 tablet by mouth daily.    . ondansetron (ZOFRAN ODT) 4 MG disintegrating tablet Allow 1-2 tablets to dissolve in your mouth every 8 hours as needed for nausea/vomiting 30 tablet 0  . polyethylene glycol (MIRALAX / GLYCOLAX) packet Take 17 g by mouth daily. 14 each 6  . dicyclomine (BENTYL) 10 MG capsule Take 1 capsule (10 mg total) by mouth 4 (four) times daily -  before meals and at bedtime. 30 capsule 0  . metroNIDAZOLE (FLAGYL) 500 MG tablet Take one (1) tablet at 6 pm and one (1) tablet at 11 pm the evening prior to surgery. 2 tablet 0  . neomycin (MYCIFRADIN) 500 MG tablet Take two (2) tablets at 6 pm and two (2) tablets at 11 pm the evening prior to surgery. 4 tablet 0  . polyethylene glycol powder (GLYCOLAX/MIRALAX) powder 255 grams one bottle for bowel prep 255 g 0   No current facility-administered medications for this visit.     Review of Systems Review of Systems  Constitutional: Negative.   Respiratory: Negative.   Cardiovascular: Negative.   Gastrointestinal: Positive for diarrhea.    Blood pressure 120/78, pulse 99, temperature 98.3 F (36.8 C), temperature source Oral, resp. rate 14, height 4\' 11"  (1.499 m), weight 92 lb (41.7 kg), SpO2 95 %. The patient's weight is stable from her December 2018 exam.  Physical Exam Physical Exam  Constitutional: She is oriented to person, place, and time. She appears well-developed and well-nourished.  Eyes: Conjunctivae are normal. No scleral icterus.  Neck:  Neck supple.  Cardiovascular: Normal rate, regular rhythm and normal heart sounds.  Pulmonary/Chest: Effort normal and breath sounds normal.  Abdominal: Soft. Bowel sounds are normal. There is no tenderness.    Lymphadenopathy:    She has no cervical adenopathy.  Neurological: She is alert and oriented to person, place, and time.  Skin: Skin is warm and dry.    Data Reviewed CT scan of September 14, 2017 showed severe, recurrent sigmoid diverticulitis versus contained perforation with colitis.  2 small pelvic abscesses noted.  Mild left hydroureteronephrosis with a transition point in the left pelvis associated with inflammatory changes.  CBC completed yesterday showed normalization of **Note De-Identified Kalina [name]** the white blood cell count with 62% polys, 30% lymphocytes.  Increase platelet count of 575,000.  Hemoglobin 13.7, unchanged from 1 week earlier.  Assessment    Recurrent diverticulitis.    Plan  The patient has not had any long episodes of improvement since her initial evaluation here in November 2018.  Attempts at conservative management and with anti-inflammatories has been successful.  I think with her abdominal exam showing borborygmi and her reports of nausea with oral intake, surgical intervention is appropriate.  The patient has shown mild low serum potassium, and this will be rechecked.  The potential for colostomy (temporary) was discussed in detail with the patient and her mother.  If the inflammatory changes noted at the time of her September 14, 2017 CT scan is indeed purulent fluid, stoma would be safer than primary anastomosis.  The importance of trying to eat even small amounts, high-protein, high-fat, high carbohydrates to improve her nutrition prior to surgery was emphasized.   HPI, Physical Exam, Assessment and Plan have been scribed under the direction and in the presence of Hervey Ard, MD.  Gaspar Cola, CMA  I have completed the exam and reviewed the above documentation  for accuracy and completeness.  I agree with the above.  Haematologist has been used and any errors in dictation or transcription are unintentional.  Hervey Ard, M.D., F.A.C.S.  Forest Gleason Roxann Vierra 09/29/2017, 9:39 PM   Patient's surgery has been scheduled for 10-08-17 at [facility]. The patient has been asked to complete a bowel prep with antibiotics. Instructions reviewed with the patient today. She verbalizes understanding.   Dominga Ferry, CMA

## 2017-09-30 ENCOUNTER — Other Ambulatory Visit: Payer: Self-pay | Admitting: Unknown Physician Specialty

## 2017-09-30 ENCOUNTER — Encounter
Admission: RE | Admit: 2017-09-30 | Discharge: 2017-09-30 | Disposition: A | Discharge status: Home / Self Care | Payer: 59 | Source: Ambulatory Visit | Attending: General Surgery | Admitting: General Surgery

## 2017-09-30 ENCOUNTER — Other Ambulatory Visit: Payer: Self-pay

## 2017-09-30 DIAGNOSIS — Z01812 Encounter for preprocedural laboratory examination: Secondary | ICD-10-CM | POA: Insufficient documentation

## 2017-09-30 DIAGNOSIS — Z01810 Encounter for preprocedural cardiovascular examination: Secondary | ICD-10-CM | POA: Diagnosis present

## 2017-09-30 DIAGNOSIS — I10 Essential (primary) hypertension: Secondary | ICD-10-CM | POA: Insufficient documentation

## 2017-09-30 HISTORY — DX: Pneumonia, unspecified organism: J18.9

## 2017-09-30 LAB — URINALYSIS, COMPLETE (UACMP) WITH MICROSCOPIC
BACTERIA UA: NONE SEEN
BILIRUBIN URINE: NEGATIVE
Glucose, UA: NEGATIVE mg/dL
KETONES UR: NEGATIVE mg/dL
Nitrite: NEGATIVE
PH: 7 (ref 5.0–8.0)
PROTEIN: NEGATIVE mg/dL
Specific Gravity, Urine: 1.014 (ref 1.005–1.030)

## 2017-09-30 LAB — BASIC METABOLIC PANEL
Anion gap: 9 (ref 5–15)
BUN: 12 mg/dL (ref 6–20)
CO2: 23 mmol/L (ref 22–32)
Calcium: 9 mg/dL (ref 8.9–10.3)
Chloride: 106 mmol/L (ref 101–111)
Creatinine, Ser: 0.66 mg/dL (ref 0.44–1.00)
GFR calc Af Amer: >60 mL/min (ref >60)
GLUCOSE: 100 mg/dL — AB (ref 65–99)
Potassium: 3.3 mmol/L — ABNORMAL LOW (ref 3.5–5.1)
Sodium: 138 mmol/L (ref 135–145)

## 2017-09-30 NOTE — Patient Instructions (Signed)
**Note De-Identified Feld Obfuscation** Your procedure is scheduled EL:FYBOF 8, 2019 FRIDAY Report to Same Day Surgery on the 2nd floor in the [facility].  To find out your arrival time, please call [phone] between 1PM - 3PM on: October 07, 2017 Thursday   REMEMBER: Instructions that are not followed completely may result in serious medical risk, up to and including death; or upon the discretion of your surgeon and anesthesiologist your surgery may need to be rescheduled.   .FOLLOW BOWEL PREP INSTRUCTIONS GIVEN BY OFFICE FOR THE LAST TIME YOU CAN EAT OR DRINK BEFORE SURGERY.  No Alcohol for 24 hours before or after surgery.  No Smoking including e-cigarettes for 24 hours prior to surgery. No chewable tobacco products for at least 6 hours prior to surgery. No nicotine patches on the day of surgery.  On the morning of surgery brush your teeth with toothpaste and water, you may rinse your mouth with mouthwash if you wish. Do not swallow any  toothpaste OR mouthwash.  Notify your doctor if there is any change in your medical condition (cold, fever, infection).  Do not wear jewelry, make-up, hairpins, clips or nail polish.  Do not wear lotions, powders, or perfumes. You may NOT wear deodorant.  Do not shave 48 hours prior to surgery. Men may shave face and neck.  Contacts and dentures may not be worn into surgery.  Do not bring valuables to the hospital. [facility] is not responsible for any belongings or valuables.  TAKE THESE MEDICATIONS THE MORNING OF SURGERY WITH A SIP OF WATER: METOPROLOL OMEPRAZOLE TAKE DOSE THE NIGHT BEFORE SURGERY AND THE MORNING OF SURGERY  Use CHG Soap or wipes as directed on instruction sheet.   FOLLOW BOWEL PREP INSTRUCTIONS FROM OFFICE   Follow recommendations from Cardiologist, Pulmonologist or PCP regarding stopping Aspirin, Coumadin, Plavix, Eliquis, Pradaxa, or Pletal.  Stop Anti-inflammatories such as MELOXICAM  Advil, Aleve, Ibuprofen, Motrin, Naproxen, Naprosyn, Goodie powder,  or aspirin products. (May take Tylenol or Acetaminophen if needed.)  Stop ANY OVER THE COUNTER supplements until after surgery. (May continue Vitamin D, Vitamin B, and multivitamin.)  If you are being admitted to the hospital overnight, leave your suitcase in the car. After surgery it may be brought to your room.  If you are being discharged the day of surgery, you will not be allowed to drive home. You will need someone to drive you home and stay with you that night.   If you are taking public transportation, you will need to have a responsible adult to with you.  Please call the number above if you have any questions about these instructions.

## 2017-09-30 NOTE — Pre-Procedure Instructions (Signed)
**Note De-Identified Gaul Obfuscation** Faxed abnormal urinalysis and met b  with a potassium of 3.3.

## 2017-10-01 ENCOUNTER — Other Ambulatory Visit: Payer: Self-pay | Admitting: General Surgery

## 2017-10-01 ENCOUNTER — Telehealth: Payer: Self-pay

## 2017-10-01 MED ORDER — POTASSIUM CHLORIDE ER 10 MEQ PO TBCR: 10.0000 meq | EXTENDED_RELEASE_TABLET | Freq: Two times a day (BID) | ORAL | 0 refills | Status: DC

## 2017-10-01 NOTE — Telephone Encounter (Signed)
**Note De-Identified Sp[place] Obfuscation** Patient notified and will start her supplements today.

## 2017-10-01 NOTE — Progress Notes (Unsigned)
**Note De-Identified Cretella Obfuscation** Low potassium. Will supplement with KCL 10 mEq BID until surgery.

## 2017-10-01 NOTE — Telephone Encounter (Signed)
**Note De-identified Boldin Obfuscation** -----  **Note De-Identified [facility] Obfuscation** Message from [name], MD sent at 10/01/2017  8:17 AM EST ----- Please notify the patient a prescription for potassium to be taken twice a day between now and surgery has been sent to her pharmacy. Thanks.

## 2017-10-07 MED ORDER — SODIUM CHLORIDE 0.9 % IV SOLN
1.0000 g | INTRAVENOUS | Status: AC
  Administered 2017-10-08: 1 g via INTRAVENOUS
  Filled 2017-10-07: qty 1

## 2017-10-08 ENCOUNTER — Inpatient Hospital Stay: Payer: 59 | Admitting: Anesthesiology

## 2017-10-08 ENCOUNTER — Inpatient Hospital Stay
Admission: RE | Admit: 2017-10-08 | Discharge: 2017-10-12 | DRG: 331 | Disposition: A | Discharge status: Home / Self Care | Payer: 59 | Source: Ambulatory Visit | Attending: General Surgery | Admitting: General Surgery

## 2017-10-08 ENCOUNTER — Other Ambulatory Visit: Payer: Self-pay

## 2017-10-08 ENCOUNTER — Encounter
Admission: RE | Disposition: A | Discharge status: Home / Self Care | Payer: Self-pay | Source: Ambulatory Visit | Attending: General Surgery

## 2017-10-08 DIAGNOSIS — F17210 Nicotine dependence, cigarettes, uncomplicated: Secondary | ICD-10-CM | POA: Diagnosis present

## 2017-10-08 DIAGNOSIS — Z5331 Laparoscopic surgical procedure converted to open procedure: Secondary | ICD-10-CM | POA: Diagnosis not present

## 2017-10-08 DIAGNOSIS — K219 Gastro-esophageal reflux disease without esophagitis: Secondary | ICD-10-CM | POA: Diagnosis present

## 2017-10-08 DIAGNOSIS — K5792 Diverticulitis of intestine, part unspecified, without perforation or abscess without bleeding: Secondary | ICD-10-CM | POA: Diagnosis present

## 2017-10-08 DIAGNOSIS — K5732 Diverticulitis of large intestine without perforation or abscess without bleeding: Secondary | ICD-10-CM | POA: Diagnosis present

## 2017-10-08 HISTORY — PX: COLON RESECTION: SHX5231

## 2017-10-08 LAB — POCT I-STAT 4, (NA,K, GLUC, HGB,HCT)
Glucose, Bld: 104 mg/dL — ABNORMAL HIGH (ref 65–99)
HCT: 41 % (ref 36.0–46.0)
Hemoglobin: 13.9 g/dL (ref 12.0–15.0)
Potassium: 4 mmol/L (ref 3.5–5.1)
Sodium: 140 mmol/L (ref 135–145)

## 2017-10-08 SURGERY — LAPAROSCOPIC SIGMOID COLON RESECTION
Anesthesia: General | Wound class: Clean Contaminated

## 2017-10-08 MED ORDER — ALVIMOPAN 12 MG PO CAPS
12.0000 mg | ORAL_CAPSULE | ORAL | Status: AC
  Administered 2017-10-08: 12 mg via ORAL

## 2017-10-08 MED ORDER — LABETALOL HCL 5 MG/ML IV SOLN
INTRAVENOUS | Status: AC
  Filled 2017-10-08: qty 4

## 2017-10-08 MED ORDER — LIDOCAINE HCL (CARDIAC) 20 MG/ML IV SOLN
INTRAVENOUS | Status: DC | PRN
  Administered 2017-10-08: 40 mg via INTRAVENOUS

## 2017-10-08 MED ORDER — ROCURONIUM BROMIDE 50 MG/5ML IV SOLN
INTRAVENOUS | Status: AC
  Filled 2017-10-08: qty 1

## 2017-10-08 MED ORDER — LIDOCAINE HCL (PF) 2 % IJ SOLN
INTRAMUSCULAR | Status: AC
  Filled 2017-10-08: qty 10

## 2017-10-08 MED ORDER — OXYCODONE HCL 5 MG/5ML PO SOLN: 5.0000 mg | Freq: Once | ORAL | Status: DC | PRN

## 2017-10-08 MED ORDER — KETOROLAC TROMETHAMINE 15 MG/ML IJ SOLN
15.0000 mg | Freq: Three times a day (TID) | INTRAMUSCULAR | Status: DC
  Administered 2017-10-08 – 2017-10-09 (×2): 15 mg via INTRAVENOUS
  Filled 2017-10-08 (×7): qty 1

## 2017-10-08 MED ORDER — PROPOFOL 10 MG/ML IV BOLUS
INTRAVENOUS | Status: AC
  Filled 2017-10-08: qty 20

## 2017-10-08 MED ORDER — ONDANSETRON HCL 4 MG/2ML IJ SOLN
INTRAMUSCULAR | Status: DC | PRN
  Administered 2017-10-08: 4 mg via INTRAVENOUS

## 2017-10-08 MED ORDER — ALVIMOPAN 12 MG PO CAPS
12.0000 mg | ORAL_CAPSULE | Freq: Two times a day (BID) | ORAL | Status: DC
  Administered 2017-10-09 – 2017-10-12 (×7): 12 mg via ORAL
  Filled 2017-10-08 (×8): qty 1

## 2017-10-08 MED ORDER — ENOXAPARIN SODIUM 30 MG/0.3ML ~~LOC~~ SOLN
30.0000 mg | SUBCUTANEOUS | Status: DC
  Administered 2017-10-09 – 2017-10-12 (×4): 30 mg via SUBCUTANEOUS
  Filled 2017-10-08 (×4): qty 0.3

## 2017-10-08 MED ORDER — LACTATED RINGERS IV SOLN
INTRAVENOUS | Status: DC
  Administered 2017-10-08 (×2): via INTRAVENOUS

## 2017-10-08 MED ORDER — KETOROLAC TROMETHAMINE 15 MG/ML IJ SOLN
INTRAMUSCULAR | Status: AC
  Administered 2017-10-08: 15 mg via INTRAVENOUS
  Filled 2017-10-08: qty 1

## 2017-10-08 MED ORDER — ACETAMINOPHEN 10 MG/ML IV SOLN
INTRAVENOUS | Status: DC | PRN
  Administered 2017-10-08: 1000 mg via INTRAVENOUS

## 2017-10-08 MED ORDER — FENTANYL CITRATE (PF) 100 MCG/2ML IJ SOLN
INTRAMUSCULAR | Status: AC
  Filled 2017-10-08: qty 2

## 2017-10-08 MED ORDER — LABETALOL HCL 5 MG/ML IV SOLN
INTRAVENOUS | Status: DC | PRN
  Administered 2017-10-08 (×2): 10 mg via INTRAVENOUS

## 2017-10-08 MED ORDER — ACETAMINOPHEN 10 MG/ML IV SOLN
500.0000 mg | Freq: Four times a day (QID) | INTRAVENOUS | Status: AC
  Administered 2017-10-09 (×4): 500 mg via INTRAVENOUS
  Filled 2017-10-08 (×4): qty 50

## 2017-10-08 MED ORDER — OXYCODONE HCL 5 MG PO TABS: 5.0000 mg | ORAL_TABLET | Freq: Once | ORAL | Status: DC | PRN

## 2017-10-08 MED ORDER — HYDROMORPHONE HCL 1 MG/ML IJ SOLN
INTRAMUSCULAR | Status: AC
  Administered 2017-10-08: 0.5 mg via INTRAVENOUS
  Filled 2017-10-08: qty 1

## 2017-10-08 MED ORDER — BUPIVACAINE-EPINEPHRINE (PF) 0.25% -1:200000 IJ SOLN
INTRAMUSCULAR | Status: DC | PRN
  Administered 2017-10-08: 30 mL via PERINEURAL

## 2017-10-08 MED ORDER — DEXAMETHASONE SODIUM PHOSPHATE 10 MG/ML IJ SOLN
INTRAMUSCULAR | Status: DC | PRN
  Administered 2017-10-08: 5 mg via INTRAVENOUS

## 2017-10-08 MED ORDER — PROMETHAZINE HCL 25 MG/ML IJ SOLN
12.5000 mg | INTRAMUSCULAR | Status: AC | PRN
  Administered 2017-10-08 (×2): 12.5 mg via INTRAVENOUS

## 2017-10-08 MED ORDER — IPRATROPIUM-ALBUTEROL 0.5-2.5 (3) MG/3ML IN SOLN: 3.0000 mL | Freq: Once | RESPIRATORY_TRACT | Status: DC

## 2017-10-08 MED ORDER — HYDROMORPHONE HCL 1 MG/ML IJ SOLN
0.5000 mg | INTRAMUSCULAR | Status: DC | PRN
  Administered 2017-10-08: 0.5 mg via INTRAVENOUS

## 2017-10-08 MED ORDER — FENTANYL CITRATE (PF) 250 MCG/5ML IJ SOLN
INTRAMUSCULAR | Status: AC
  Filled 2017-10-08: qty 5

## 2017-10-08 MED ORDER — FENTANYL CITRATE (PF) 100 MCG/2ML IJ SOLN
INTRAMUSCULAR | Status: DC | PRN
  Administered 2017-10-08 (×7): 50 ug via INTRAVENOUS

## 2017-10-08 MED ORDER — IPRATROPIUM-ALBUTEROL 0.5-2.5 (3) MG/3ML IN SOLN
3.0000 mL | Freq: Once | RESPIRATORY_TRACT | Status: DC
  Filled 2017-10-08: qty 3

## 2017-10-08 MED ORDER — SUGAMMADEX SODIUM 200 MG/2ML IV SOLN
INTRAVENOUS | Status: DC | PRN
  Administered 2017-10-08: 83.4 mg via INTRAVENOUS

## 2017-10-08 MED ORDER — HYDRALAZINE HCL 20 MG/ML IJ SOLN
INTRAMUSCULAR | Status: AC
  Filled 2017-10-08: qty 1

## 2017-10-08 MED ORDER — FENTANYL CITRATE (PF) 100 MCG/2ML IJ SOLN
INTRAMUSCULAR | Status: AC
  Administered 2017-10-08: 25 ug via INTRAVENOUS
  Filled 2017-10-08: qty 2

## 2017-10-08 MED ORDER — ROCURONIUM BROMIDE 100 MG/10ML IV SOLN
INTRAVENOUS | Status: DC | PRN
  Administered 2017-10-08 (×2): 10 mg via INTRAVENOUS
  Administered 2017-10-08: 20 mg via INTRAVENOUS
  Administered 2017-10-08: 30 mg via INTRAVENOUS

## 2017-10-08 MED ORDER — ONDANSETRON HCL 4 MG/2ML IJ SOLN
INTRAMUSCULAR | Status: AC
  Filled 2017-10-08: qty 2

## 2017-10-08 MED ORDER — OXYCODONE HCL 5 MG PO TABS
5.0000 mg | ORAL_TABLET | ORAL | Status: DC | PRN
  Administered 2017-10-08 – 2017-10-12 (×14): 5 mg via ORAL
  Filled 2017-10-08 (×14): qty 1

## 2017-10-08 MED ORDER — POTASSIUM CHLORIDE 2 MEQ/ML IV SOLN
INTRAVENOUS | Status: DC
  Administered 2017-10-08 – 2017-10-09 (×2): via INTRAVENOUS
  Filled 2017-10-08 (×4): qty 1000

## 2017-10-08 MED ORDER — LORAZEPAM 0.5 MG PO TABS
0.5000 mg | ORAL_TABLET | Freq: Two times a day (BID) | ORAL | Status: DC
  Administered 2017-10-08 – 2017-10-12 (×8): 0.5 mg via ORAL
  Filled 2017-10-08 (×8): qty 1

## 2017-10-08 MED ORDER — LIDOCAINE HCL (PF) 1 % IJ SOLN
INTRAMUSCULAR | Status: AC
  Filled 2017-10-08: qty 2

## 2017-10-08 MED ORDER — PROMETHAZINE HCL 25 MG/ML IJ SOLN
6.2500 mg | INTRAMUSCULAR | Status: DC | PRN
  Administered 2017-10-08 – 2017-10-10 (×2): 6.25 mg via INTRAVENOUS
  Filled 2017-10-08 (×2): qty 1

## 2017-10-08 MED ORDER — SUGAMMADEX SODIUM 200 MG/2ML IV SOLN
INTRAVENOUS | Status: AC
  Filled 2017-10-08: qty 2

## 2017-10-08 MED ORDER — BUPIVACAINE-EPINEPHRINE (PF) 0.25% -1:200000 IJ SOLN
INTRAMUSCULAR | Status: AC
  Filled 2017-10-08: qty 10

## 2017-10-08 MED ORDER — MIDAZOLAM HCL 2 MG/2ML IJ SOLN
INTRAMUSCULAR | Status: AC
  Administered 2017-10-08: 2 mg via INTRAVENOUS
  Filled 2017-10-08: qty 2

## 2017-10-08 MED ORDER — FENTANYL CITRATE (PF) 100 MCG/2ML IJ SOLN
25.0000 ug | INTRAMUSCULAR | Status: AC | PRN
  Administered 2017-10-08 (×6): 25 ug via INTRAVENOUS

## 2017-10-08 MED ORDER — MIDAZOLAM HCL 2 MG/2ML IJ SOLN
INTRAMUSCULAR | Status: AC
  Filled 2017-10-08: qty 2

## 2017-10-08 MED ORDER — MIDAZOLAM HCL 2 MG/2ML IJ SOLN
2.0000 mg | Freq: Once | INTRAMUSCULAR | Status: AC
  Administered 2017-10-08: 2 mg via INTRAVENOUS

## 2017-10-08 MED ORDER — HYDRALAZINE HCL 20 MG/ML IJ SOLN
INTRAMUSCULAR | Status: DC | PRN
  Administered 2017-10-08: 4 mg via INTRAVENOUS

## 2017-10-08 MED ORDER — SODIUM CHLORIDE FLUSH 0.9 % IV SOLN
INTRAVENOUS | Status: AC
  Filled 2017-10-08: qty 10

## 2017-10-08 MED ORDER — ACETAMINOPHEN 10 MG/ML IV SOLN
INTRAVENOUS | Status: AC
  Filled 2017-10-08: qty 100

## 2017-10-08 MED ORDER — PROMETHAZINE HCL 25 MG/ML IJ SOLN
INTRAMUSCULAR | Status: AC
  Administered 2017-10-08: 12.5 mg via INTRAVENOUS
  Filled 2017-10-08: qty 1

## 2017-10-08 MED ORDER — MIRTAZAPINE 15 MG PO TABS
30.0000 mg | ORAL_TABLET | Freq: Every day | ORAL | Status: DC
  Administered 2017-10-08 – 2017-10-11 (×4): 30 mg via ORAL
  Filled 2017-10-08 (×4): qty 2

## 2017-10-08 MED ORDER — SODIUM CHLORIDE 0.9 % IV SOLN
1.0000 g | INTRAVENOUS | Status: DC
  Administered 2017-10-09: 1 g via INTRAVENOUS
  Filled 2017-10-08 (×2): qty 1

## 2017-10-08 MED ORDER — ALVIMOPAN 12 MG PO CAPS
ORAL_CAPSULE | ORAL | Status: AC
  Filled 2017-10-08: qty 1

## 2017-10-08 MED ORDER — METOPROLOL SUCCINATE ER 50 MG PO TB24: 50.0000 mg | ORAL_TABLET | Freq: Every day | ORAL | Status: DC

## 2017-10-08 MED ORDER — MORPHINE SULFATE (PF) 2 MG/ML IV SOLN
2.0000 mg | INTRAVENOUS | Status: DC | PRN
  Administered 2017-10-08 – 2017-10-12 (×14): 2 mg via INTRAVENOUS
  Filled 2017-10-08 (×14): qty 1

## 2017-10-08 MED ORDER — DEXAMETHASONE SODIUM PHOSPHATE 10 MG/ML IJ SOLN
INTRAMUSCULAR | Status: AC
  Filled 2017-10-08: qty 1

## 2017-10-08 MED ORDER — PROPOFOL 10 MG/ML IV BOLUS
INTRAVENOUS | Status: DC | PRN
  Administered 2017-10-08: 80 mg via INTRAVENOUS

## 2017-10-08 MED ORDER — PANTOPRAZOLE SODIUM 40 MG PO TBEC
40.0000 mg | DELAYED_RELEASE_TABLET | Freq: Every day | ORAL | Status: DC
  Administered 2017-10-09 – 2017-10-12 (×4): 40 mg via ORAL
  Filled 2017-10-08 (×4): qty 1

## 2017-10-08 SURGICAL SUPPLY — 82 items
BAG COUNTER SPONGE EZ (MISCELLANEOUS) IMPLANT
BLADE SURG 11 STRL SS SAFETY (MISCELLANEOUS) ×3 IMPLANT
BLADE SURG 15 STRL SS SAFETY (BLADE) ×3 IMPLANT
BRUSH SCRUB EZ  4% CHG (MISCELLANEOUS)
BRUSH SCRUB EZ 4% CHG (MISCELLANEOUS) IMPLANT
CANISTER SUCT 1200ML W/VALVE (MISCELLANEOUS) ×3 IMPLANT
CANNULA DILATOR  5MM W/SLV (CANNULA) ×2
CANNULA DILATOR 10 W/SLV (CANNULA) ×2 IMPLANT
CANNULA DILATOR 10MM W/SLV (CANNULA) ×1
CANNULA DILATOR 5 W/SLV (CANNULA) ×4 IMPLANT
CHLORAPREP W/TINT 26ML (MISCELLANEOUS) ×3 IMPLANT
CLOSURE WOUND 1/2 X4 (GAUZE/BANDAGES/DRESSINGS) ×1
COUNTER NEEDLE 20/40 LG (NEEDLE) ×3 IMPLANT
COUNTER SPONGE BAG EZ (MISCELLANEOUS)
COVER CLAMP SIL LG PBX B (MISCELLANEOUS) ×3 IMPLANT
COVER LIGHT HANDLE STERIS (MISCELLANEOUS) ×3 IMPLANT
COVER MAYO STAND STRL (DRAPES) ×3 IMPLANT
DRAPE LAP W/FLUID (DRAPES) ×3 IMPLANT
DRAPE LEGGINS SURG 28X43 STRL (DRAPES) ×3 IMPLANT
DRAPE UNDER BUTTOCK W/FLU (DRAPES) ×3 IMPLANT
DRSG OPSITE POSTOP 4X10 (GAUZE/BANDAGES/DRESSINGS) IMPLANT
DRSG OPSITE POSTOP 4X8 (GAUZE/BANDAGES/DRESSINGS) IMPLANT
DRSG TEGADERM 2-3/8X2-3/4 SM (GAUZE/BANDAGES/DRESSINGS) IMPLANT
DRSG TELFA 4X3 1S NADH ST (GAUZE/BANDAGES/DRESSINGS) ×3 IMPLANT
ELECT BLADE 6.5 EXT (BLADE) ×3 IMPLANT
ELECT REM PT RETURN 9FT ADLT (ELECTROSURGICAL) ×3
ELECTRODE REM PT RTRN 9FT ADLT (ELECTROSURGICAL) ×1 IMPLANT
GAUZE SPONGE 4X4 12PLY STRL (GAUZE/BANDAGES/DRESSINGS) IMPLANT
GLOVE BIO SURGEON STRL SZ7 (GLOVE) ×9 IMPLANT
GLOVE BIO SURGEON STRL SZ7.5 (GLOVE) ×9 IMPLANT
GLOVE INDICATOR 8.0 STRL GRN (GLOVE) ×6 IMPLANT
GOWN STRL REUS W/ TWL LRG LVL3 (GOWN DISPOSABLE) ×6 IMPLANT
GOWN STRL REUS W/TWL LRG LVL3 (GOWN DISPOSABLE) ×12
HOLDER FOLEY CATH W/STRAP (MISCELLANEOUS) ×3 IMPLANT
KIT TURNOVER KIT A (KITS) ×3 IMPLANT
LABEL OR SOLS (LABEL) ×3 IMPLANT
LIGASURE LAP MARYLAND 5MM 37CM (ELECTROSURGICAL) ×3 IMPLANT
NEEDLE HYPO 22GX1.5 SAFETY (NEEDLE) ×3 IMPLANT
NS IRRIG 1000ML POUR BTL (IV SOLUTION) ×9 IMPLANT
NS IRRIG 500ML POUR BTL (IV SOLUTION) ×3 IMPLANT
PACK BASIN MAJOR ARMC (MISCELLANEOUS) IMPLANT
PACK COLON CLEAN CLOSURE (MISCELLANEOUS) ×3 IMPLANT
PACK LAP CHOLECYSTECTOMY (MISCELLANEOUS) ×3 IMPLANT
POUCH ENDO CATCH 10MM SPEC (MISCELLANEOUS) IMPLANT
RELOAD PROXIMATE 75MM BLUE (ENDOMECHANICALS) IMPLANT
SEAL FOR SCOPE WARMER C3101 (MISCELLANEOUS) IMPLANT
SET YANKAUER POOLE SUCT (MISCELLANEOUS) ×3 IMPLANT
SHEARS FOC LG CVD HARMONIC 17C (MISCELLANEOUS) IMPLANT
SHEARS HARMONIC 9CM CVD (BLADE) IMPLANT
SOL PREP PVP 2OZ (MISCELLANEOUS) ×3
SOLUTION PREP PVP 2OZ (MISCELLANEOUS) ×1 IMPLANT
SPONGE KITTNER 5P (MISCELLANEOUS) ×3 IMPLANT
SPONGE LAP 18X18 5 PK (GAUZE/BANDAGES/DRESSINGS) ×6 IMPLANT
STAPLER PROXIMATE 75MM BLUE (STAPLE) IMPLANT
STAPLER SKIN PROX 35W (STAPLE) ×3 IMPLANT
STRIP CLOSURE SKIN 1/2X4 (GAUZE/BANDAGES/DRESSINGS) ×2 IMPLANT
SURGILUBE 2OZ TUBE FLIPTOP (MISCELLANEOUS) IMPLANT
SUT MAXON 0 T 12 3 (SUTURE) ×9 IMPLANT
SUT PDS AB 0 CT1 27 (SUTURE) IMPLANT
SUT PROLENE 0 CT 1 30 (SUTURE) IMPLANT
SUT SILK 2 0 (SUTURE)
SUT SILK 2-0 18XBRD TIE 12 (SUTURE) IMPLANT
SUT SILK 3 0 (SUTURE) ×2
SUT SILK 3-0 (SUTURE) ×9 IMPLANT
SUT SILK 3-0 18XBRD TIE 12 (SUTURE) ×1 IMPLANT
SUT VIC AB 0 CT1 36 (SUTURE) IMPLANT
SUT VIC AB 0 SH 27 (SUTURE) ×3 IMPLANT
SUT VIC AB 2-0 BRD 54 (SUTURE) IMPLANT
SUT VIC AB 2-0 CT1 27 (SUTURE)
SUT VIC AB 2-0 CT1 TAPERPNT 27 (SUTURE) IMPLANT
SUT VIC AB 2-0 SH 27 (SUTURE)
SUT VIC AB 2-0 SH 27XBRD (SUTURE) IMPLANT
SUT VIC AB 3-0 54X BRD REEL (SUTURE) ×1 IMPLANT
SUT VIC AB 3-0 BRD 54 (SUTURE) ×2
SUT VIC AB 3-0 SH 27 (SUTURE) ×8
SUT VIC AB 3-0 SH 27X BRD (SUTURE) ×4 IMPLANT
SUT VIC AB 4-0 FS2 27 (SUTURE) ×3 IMPLANT
SWAB CULTURE AMIES ANAERIB BLU (MISCELLANEOUS) ×3 IMPLANT
SWABSTK COMLB BENZOIN TINCTURE (MISCELLANEOUS) ×3 IMPLANT
SYR BULB IRRIG 60ML STRL (SYRINGE) ×3 IMPLANT
TRAY FOLEY W/METER SILVER 16FR (SET/KITS/TRAYS/PACK) ×3 IMPLANT
TUBING INSUFFLATION (TUBING) ×3 IMPLANT

## 2017-10-08 NOTE — Anesthesia Postprocedure Evaluation (Signed)
**Note De-Identified Rimel Obfuscation** Anesthesia Post Note  Patient: Tanaia Hawkey Lebeau  Procedure(s) Performed: LAPAROSCOPIC SIGMOID COLON RESECTION Converted to Open Sigmoid Colectomy (N/A )  Patient location during evaluation: PACU Anesthesia Type: General Level of consciousness: awake and alert Pain management: pain level controlled Vital Signs Assessment: post-procedure vital signs reviewed and stable Respiratory status: spontaneous breathing, nonlabored ventilation, respiratory function stable and patient connected to nasal cannula oxygen Cardiovascular status: blood pressure returned to baseline and stable Postop Assessment: no apparent nausea or vomiting Anesthetic complications: no     Last Vitals:  Vitals:   10/08/17 1925 10/08/17 1945  BP: (!) 92/57 (!) 78/49  Pulse: 70 77  Resp: 14 18  Temp:  36.5 C  SpO2: 97% 96%    Last Pain:  Vitals:   10/08/17 1948  TempSrc:   PainSc: 10-Worst pain ever                 Molli Barrows

## 2017-10-08 NOTE — Anesthesia Preprocedure Evaluation (Signed)
**Note De-Identified Febres Obfuscation** Anesthesia Evaluation  Patient identified by MRN, date of birth, ID band Patient awake    Reviewed: Allergy & Precautions, H&P , NPO status , Patient's Chart, lab work & pertinent test results  History of Anesthesia Complications Negative for: history of anesthetic complications  Airway Mallampati: III  TM Distance: <3 FB Neck ROM: limited    Dental  (+) Chipped, Poor Dentition, Missing   Pulmonary shortness of breath and with exertion, pneumonia, COPD, Current Smoker,           Cardiovascular Exercise Tolerance: Good hypertension, (-) angina+CHF  (-) Past MI and (-) DOE      Neuro/Psych PSYCHIATRIC DISORDERS Anxiety Depression negative neurological ROS     GI/Hepatic Neg liver ROS, GERD  Controlled and Medicated,  Endo/Other  negative endocrine ROS  Renal/GU      Musculoskeletal   Abdominal   Peds  Hematology negative hematology ROS (+)   Anesthesia Other Findings Past Medical History: 04/29/2015: Abdominal pain 03/18/2017: Acute diverticulitis of intestine 01/13/2017: Acute pancreatitis 09/01/2015: Acute sinusitis No date: Anemia No date: Anxiety No date: Benign neoplasm of sigmoid colon 07/08/2015: Breast cancer screening No date: CHF (congestive heart failure) ([facility])     Comment:  while hospitalized with pneumonia No date: Chronic pain syndrome No date: COPD (chronic obstructive pulmonary disease) ([facility])     Comment:  while hospitalized No date: Cyclic vomiting syndrome No date: Cyclical vomiting No date: Diverticulitis of large intestine with abscess without  bleeding No date: Dysphagia No date: GERD (gastroesophageal reflux disease)     Comment:  controlled 04/26/2015: Hematuria, microscopic     Comment:  Urine Sept 2016; rechecked still present; refer to               urologist; hx of "polyps" in bladder 25 years ago               (1991-ish)  No date: History of pneumonia No date: History of  sepsis No date: Hypertension     Comment:  controlled on meds 04/24/2015: Hypokalemia 02/02/2017: IBS (irritable bowel syndrome) No date: Leukocytosis 01/13/2017: Low serum potassium 07/12/2015: Marital stress     Comment:  Separated from husband Fall 2016  No date: Moderate COPD (chronic obstructive pulmonary disease) ([place]) Nov 2015: Osteopenia 05/09/2015: Pain due to dental caries 4 years ago: Pneumonia 04/24/2015: Recurrent depressive disorder, current episode moderate  ([place]) 05/12/2016: Severe depression ([place]) No date: Special screening for malignant neoplasms, colon No date: Tachycardia No date: Thrombocytosis ([place]) No date: Vitamin D deficiency disease  Past Surgical History: 09/27/79, 08/29/83: CESAREAN SECTION     Comment:  x 2 01/09/2016: COLONOSCOPY WITH PROPOFOL; N/A     Comment:  Procedure: COLONOSCOPY WITH PROPOFOL;  Surgeon: Lucilla Lame, MD;  Location: [facility];  Service:               Endoscopy;  Laterality: N/A; 01/09/2016: POLYPECTOMY     Comment:  Procedure: POLYPECTOMY;  Surgeon: Lucilla Lame, MD;                Location: [facility];  Service: Endoscopy;;     Reproductive/Obstetrics negative OB ROS                             Anesthesia Physical Anesthesia Plan  ASA: III  Anesthesia Plan: General ETT   Post-op Pain Management: **Note De-Identified Carlile Obfuscation** Induction: Intravenous  PONV Risk Score and Plan: Ondansetron, Dexamethasone and Midazolam  Airway Management Planned: Oral ETT  Additional Equipment:   Intra-op Plan:   Post-operative Plan: Extubation in OR and Possible Post-op intubation/ventilation  Informed Consent: I have reviewed the patients History and Physical, chart, labs and discussed the procedure including the risks, benefits and alternatives for the proposed anesthesia with the patient or authorized representative who has indicated his/her understanding and acceptance.   Dental Advisory Given  Plan  Discussed with: Anesthesiologist, CRNA and Surgeon  Anesthesia Plan Comments: (Patient consented for risks of anesthesia including but not limited to:  - adverse reactions to medications - damage to teeth, lips or other oral mucosa - sore throat or hoarseness - Damage to heart, brain, lungs or loss of life  Patient voiced understanding.)        Anesthesia Quick Evaluation

## 2017-10-08 NOTE — Transfer of Care (Signed)
**Note De-Identified [place]out[facility] Obfuscation** Immediate Anesthesia Transfer of Care Note  Patient: Haley [name]  Procedure(s) Performed: LAPAROSCOPIC SIGMOID COLON RESECTION Converted to Open Sigmoid Colectomy (N/A )  Patient Location: PACU  Anesthesia Type:General  Level of Consciousness: awake  Airway & Oxygen Therapy: Patient Spontanous Breathing and Patient connected to face mask oxygen  Post-op Assessment: Report given to RN and Post -op Vital signs reviewed and stable  Post vital signs: Reviewed and stable  Last Vitals:  Vitals:   10/08/17 1135 10/08/17 1804  BP: 106/76 140/85  Pulse: 89 89  Resp: 16 12  Temp: (!) 35.7 C 36.6 C  SpO2: 99% 100%    Last Pain:  Vitals:   10/08/17 1135  TempSrc: Tympanic  PainSc: 4          Complications: No apparent anesthesia complications

## 2017-10-08 NOTE — Op Note (Signed)
**Note De-Identified Lewis Obfuscation** Preoperative diagnosis: Chronic diverticulitis of the sigmoid colon.  Postoperative diagnosis: Same.  Operative procedure: Laparoscopic mobilization of the left colon and splenic flexure, open sigmoid colectomy with handsewn anastomosis.  Operating Surgeon: Hervey Ard, MD.  Assistant: Arvilla Meres, RNFA  Anesthesia: General endotracheal.  Marcaine 0.25% with 1-200,000 units of epinephrine, 30 cc.  Estimated blood loss: 350 cc.  Fluid replacement 1100 cc crystalloid.  Clinical note: This 57 year old woman had her first episode of diverticulitis in November 2018.  The patient has persistent symptoms of pain and was recently admitted with what was thought to be a new abscess.  She is has a stenotic segment in the sigmoid colon present on multiple scans and she is felt to have failed conservative treatment.  The patient underwent formal bowel prep with oral antibiotics.  She received Invanz prior to the procedure.  SCD stockings for DVT prevention.  Operative note: After the induction of general endotracheal anesthesia she was placed in dorsal lithotomy position and appropriately padded with the right arm at her side.  A Foley catheter was placed by the nurse.  The perineum was prepped with Betadine and the abdomen prepped with ChloraPrep.  After draping a varies needle was placed through a trans-umbilical incision.  After assuring intra-abdominal location with a hanging drop test the abdomen was insufflated with CO2 at 10 mmHg pressure.  A 10 mm Step port was expanded and inspection showed no evidence of injury from initial port placement.  A 5 mm Step port was placed in the left lower quadrant and an 11 mm XL port placed into the area between the umbilicus and the xiphoid.  Making use of the LigaSure device the left colon was mobilized including the splenic flexure to just about the midline.  This easily brought the colon down into the pelvis for anticipated low anastomosis.  The left ureter  was identified and protected.    There was noted to be an inflammatory process involving the sigmoid colon and left tube and ovary.  Gentle dissection showed that this was firmly adherent to the structures and at this point with full mobilization of the left colon it was elected to convert to an open procedure.  An 8 cm incision below the umbilicus was made after infiltration of local anesthetic for postoperative analgesia.  The skin was incised sharply and the remaining dissection completed with electrocautery.  An [place] wound protector was placed.  The inflammatory mass was adherent to the left tube and ovary.  The left tube was somewhat prominent and this may have accounted for the linear structure on recent CT suggesting an abscess.  Using blunt dissection the rectum was freed and the adherent mass to the anterior abdominal wall and posterior aspect of the uterus was freed.  A very small, 2-3 mm collection of purulent fluid without odor was identified while freeing the sigmoid from the left tube and ovary.  A culture was obtained.  The volume was not felt to be enough to mandate stoma formation.  Bleeding was controlled with 3-0 silk suture ligatures, 3-0 Vicryl ties and electrocautery.  A dry pack was placed in the pelvis.  The upper rectum was mobilized circumferentially and a Glassman clamp placed.  The bowel was divided after a Coker clamp was placed for control and the mesocolon divided with the LigaSure device.  The segment involved was perhaps no more than 10-12 cm in length.  The proximal bowel was normal although mildly dilated compared to the rectum.  The specimen **Note De-Identified Hummel Obfuscation** was handed off and later opened showing normal mucosa.  This was sent for routine histology.  The rectum was grasped in a three-point fashion with Allis clamps and a 25 mm dilator passed.  The 29 mm dilator would not pass.  The proximal bowel was perhaps easily 29-31 mm in diameter.  Transfixion sutures of 3-0 silk seromuscular were  placed and a handsewn 2 layer anastomosis completed.  Posterior row of 3-0 seromuscular silk.  An inner row of 3-0 running, locking Vicryl switching to a Connell stitch on the anterior wall.  A second layer of 3-0 silk sutures were used to complete the anastomosis.  The anastomosis was rotated and 2 additional silk sutures placed to complete the anastomosis.  A rubber shod clamp was placed proximally and a rigid sigmoidoscope passed per anus.  The pelvis was filled with saline and the rectum insufflated.  No leakage noted.  Final inspection of the pelvis was completed after change of gloves.  Small area on the posterior surface of the uterus required cautery for hemostasis.  Final irrigation was undertaken.  Absolutely no tension on the anastomosis.  Gowns and gloves were changed, Betadine applied to the abdominal wound and the area redraped.  With the sponge tape and instrument count correct the fascia was closed in a single layer with interrupted 0 Maxon figure-of-eight sutures.  Final wound irrigation with saline.  Subcutaneous fat was approximated with a 2-0 Vicryl suture and the skin closed with a running 4-0 Vicryl subarticular suture.  The port sites were closed with a 4-0 Vicryl subarticular suture.  Benzoin and Steri-Strip followed by a honeycomb dressing to the primary wound and Telfa and Tegaderm dressings to the port sites were applied.  Foley catheter was removed and the patient was taken to the recovery room in stable condition.

## 2017-10-08 NOTE — Anesthesia Procedure Notes (Signed)
**Note De-Identified Provencio Obfuscation** Procedure Name: Intubation Date/Time: 10/08/2017 2:50 PM Performed by: Eben Burow, CRNA Pre-anesthesia Checklist: Patient identified, Emergency Drugs available, Suction available, Patient being monitored and Timeout performed Patient Re-evaluated:Patient Re-evaluated prior to induction Oxygen Delivery Method: Circle system utilized Preoxygenation: Pre-oxygenation with 100% oxygen Induction Type: IV induction Ventilation: Mask ventilation without difficulty Laryngoscope Size: McGraph and 3 Grade View: Grade I Tube type: Oral Tube size: 6.5 mm Number of attempts: 1 Airway Equipment and Method: Stylet and LTA kit utilized Placement Confirmation: ETT inserted through vocal cords under direct vision,  positive ETCO2 and breath sounds checked- equal and bilateral Secured at: 19 cm Tube secured with: Tape Dental Injury: Teeth and Oropharynx as per pre-operative assessment

## 2017-10-08 NOTE — Anesthesia Post-op Follow-up Note (Signed)
**Note De-identified Naval Obfuscation** Anesthesia QCDR form completed.        

## 2017-10-08 NOTE — H&P (Signed)
**Note De-Identified Dames Obfuscation** No change in clinical history or exam.  Plans for sigmoid colectomy.  Possibility of colostomy if intra-abdominal abscess is identified reviewed with the patient and her family.  Stoma site marked.

## 2017-10-09 ENCOUNTER — Encounter: Payer: Self-pay | Admitting: General Surgery

## 2017-10-09 MED ORDER — METOPROLOL TARTRATE 25 MG PO TABS
12.5000 mg | ORAL_TABLET | Freq: Every day | ORAL | Status: DC
  Administered 2017-10-10: 12.5 mg via ORAL
  Filled 2017-10-09 (×3): qty 1

## 2017-10-09 MED ORDER — ACETAMINOPHEN 500 MG PO TABS
500.0000 mg | ORAL_TABLET | ORAL | Status: DC
  Administered 2017-10-10 – 2017-10-12 (×11): 500 mg via ORAL
  Filled 2017-10-09 (×12): qty 1

## 2017-10-09 NOTE — Progress Notes (Signed)
**Note De-Identified Branam Obfuscation** Afebrile. BP running low, but urine output is excellent. Small amount of belching, no flatus. Pain manageable, runs from sleeping to "7". Lungs: Clear. (No incentive spirometer in room). Cardio: RR. ABD: Minimal distension, soft, excellent BS. Wound: Clean and dry.  Modest pelvic discomfort when urinating, but voiding well. IMP: Doing well. Reviewed intra-operative findings. Plan: Hold diet a clears today.  Ambulate. Incentive.  Decrease Metoprolol to 12.5 pending BP improvement.

## 2017-10-10 LAB — CBC WITH DIFFERENTIAL/PLATELET
Basophils Absolute: 0 10*3/uL (ref 0–0.1)
Basophils Relative: 1 %
EOS PCT: 1 %
Eosinophils Absolute: 0.1 10*3/uL (ref 0–0.7)
HEMATOCRIT: 27.8 % — AB (ref 35.0–47.0)
HEMOGLOBIN: 9.9 g/dL — AB (ref 12.0–16.0)
LYMPHS ABS: 2.2 10*3/uL (ref 1.0–3.6)
LYMPHS PCT: 24 %
MCH: 32.3 pg (ref 26.0–34.0)
MCHC: 35.7 g/dL (ref 32.0–36.0)
MCV: 90.4 fL (ref 80.0–100.0)
Monocytes Absolute: 0.5 10*3/uL (ref 0.2–0.9)
Monocytes Relative: 6 %
NEUTROS ABS: 6.2 10*3/uL (ref 1.4–6.5)
NEUTROS PCT: 68 %
PLATELETS: 277 10*3/uL (ref 150–440)
RBC: 3.08 MIL/uL — AB (ref 3.80–5.20)
RDW: 14.5 % (ref 11.5–14.5)
WBC: 9.1 10*3/uL (ref 3.6–11.0)

## 2017-10-10 LAB — BASIC METABOLIC PANEL WITH GFR
Anion gap: 7 (ref 5–15)
BUN: 6 mg/dL (ref 6–20)
CO2: 25 mmol/L (ref 22–32)
Calcium: 8.3 mg/dL — ABNORMAL LOW (ref 8.9–10.3)
Chloride: 108 mmol/L (ref 101–111)
Creatinine, Ser: 0.47 mg/dL (ref 0.44–1.00)
GFR calc Af Amer: >60 mL/min
GFR calc non Af Amer: >60 mL/min
Glucose, Bld: 90 mg/dL (ref 65–99)
Potassium: 3.6 mmol/L (ref 3.5–5.1)
Sodium: 140 mmol/L (ref 135–145)

## 2017-10-10 NOTE — Progress Notes (Signed)
**Note De-identified Peralta Obfuscation** Time hr behind 

## 2017-10-10 NOTE — Progress Notes (Addendum)
**Note De-Identified Raucci Obfuscation** AVSS. BP better with lower dose of metoprolol. Slow to ambulate, has not been in the hall yet. Up frequently to urinate. No flatus. Not really hungry. No nausea. Belching from yesterday resolved. Lungs: Clear. Cardio: RR. ABD: Soft, non-tender, good BS. Minimal distension. Extrem: Soft. U/O: Excellent. Labs: [place], [place].  CBC pending. Culture: No organisms. Few WBC.  IMP: Doing well. Plan: Slow diet advance.  Ambulation. D/C Invanz and IV fluids.    CBC results reviewed. HGB down from 13.9 preop to 9.9. Patient without symptoms. WBC: Normal at 9,100. Normal differential. Platelets down to 277K from 575K prior to surgery.

## 2017-10-11 MED ORDER — SODIUM CHLORIDE 0.9% FLUSH
10.0000 mL | Freq: Two times a day (BID) | INTRAVENOUS | Status: DC
  Administered 2017-10-11: 10 mL
  Administered 2017-10-12: 20 mL

## 2017-10-11 MED ORDER — SODIUM CHLORIDE 0.9% FLUSH: 10.0000 mL | INTRAVENOUS | Status: DC | PRN

## 2017-10-11 MED ORDER — POTASSIUM CHLORIDE CRYS ER 20 MEQ PO TBCR
20.0000 meq | EXTENDED_RELEASE_TABLET | Freq: Every day | ORAL | Status: DC
  Administered 2017-10-11 – 2017-10-12 (×2): 20 meq via ORAL
  Filled 2017-10-11 (×2): qty 1

## 2017-10-11 NOTE — Progress Notes (Signed)
**Note De-Identified Massoud Obfuscation** AVSS. Outstanding u/o.  Tolerated diet advance.  Passing some flatus. No BM yet. Ambulating with [facility] encouragement. Lungs: Clear. Incentive > 1200. Cardio: RR. ABD: Soft, minimal distension, BS+. Wound: Clean. Extrem: Soft. Nausea w/ pain (incisional) last night. Otherwise, doing well. IMP: Steady progress. Plan: Ambulate more. Advance diet. Tentative d/c home in AM.

## 2017-10-12 LAB — SURGICAL PATHOLOGY

## 2017-10-12 MED ORDER — POTASSIUM CHLORIDE ER 10 MEQ PO TBCR: 10.0000 meq | EXTENDED_RELEASE_TABLET | Freq: Every day | ORAL | 0 refills | Status: DC

## 2017-10-12 MED ORDER — OXYCODONE-ACETAMINOPHEN 5-325 MG PO TABS: 1.0000 | ORAL_TABLET | Freq: Four times a day (QID) | ORAL | 0 refills | Status: DC | PRN

## 2017-10-12 MED ORDER — METOPROLOL TARTRATE 25 MG PO TABS: 12.5000 mg | ORAL_TABLET | Freq: Every day | ORAL | 0 refills | Status: DC

## 2017-10-12 NOTE — Progress Notes (Signed)
**Note De-Identified Hillery Obfuscation** Pt discharged per MD order. Discharge instructions reviewed with pt. All questions answered to pt satisfaction. Pt taken to car in wheelchair by volunteer.

## 2017-10-12 NOTE — Final Progress Note (Addendum)
**Note De-Identified Cornacchia Obfuscation** AVSS. Tolerating diet well.  + BM. Ambulating well.  Inspirex at 1500. U/O: Great. Lungs: Clear. Cardio: RR. ABD: Soft, scaphoid, non-tender. Wound: Clean Extrem: Soft.  Plan: Home today.   Culture report came in after rounds yesterday. Light growth of e. Coli, sens to all.  Patient had 48 hours of IV antibiotic coverage.   Will not resume antibiotics at this time.

## 2017-10-13 ENCOUNTER — Telehealth: Payer: Self-pay | Admitting: *Deleted

## 2017-10-13 LAB — AEROBIC/ANAEROBIC CULTURE W GRAM STAIN (SURGICAL/DEEP WOUND)

## 2017-10-13 LAB — AEROBIC/ANAEROBIC CULTURE (SURGICAL/DEEP WOUND)

## 2017-10-13 NOTE — Telephone Encounter (Signed)
**Note De-Identified Bottoms Obfuscation** Notified patient as instructed, patient pleased. Discussed follow-up appointments, patient agrees. She is sore and has had one formed Bm. Denies nausea or vomiting.

## 2017-10-13 NOTE — Telephone Encounter (Signed)
**Note De-identified Courtney Obfuscation** -----  **Note De-Identified Bernales Obfuscation** Message from [name], MD sent at 10/13/2017 10:56 AM EDT ----- Patient discharged yesterday. Let her know the path was fine (diverticulitis only). See how she is getting along. Thanks.  ----- Message ----- From: Interface, Lab In Three Zero One Sent: 10/12/2017   6:28 PM To: [name], MD

## 2017-10-16 NOTE — Discharge Summary (Signed)
**Note De-Identified Papania Obfuscation** Physician Discharge Summary  Patient ID: Haley [name] MRN: [id number] DOB/AGE: Dec 13, 1960 57 y.o.  Admit date: 10/08/2017 Discharge date: 10/16/2017  Admission Diagnoses: Sigmoid diverticulitis with stricture  Discharge Diagnoses:  Active Problems:   Diverticulitis   Discharged Condition: good  Hospital Course: Patient underwent laparoscopically assisted sigmoid colectomy.  Early ambulation.  Diet advanced without difficulty.  Some efforts at pain control necessary due to long-standing illness.  Clear cardiopulmonary exam at discharge.  Consults: None  Significant Diagnostic Studies: None  Treatments: IV hydration  Discharge Exam: Blood pressure 109/72, pulse 74, temperature 98.4 F (36.9 C), temperature source Oral, resp. rate 17, height 4\' 11"  (1.499 m), weight 102 lb (46.3 kg), SpO2 98 %. General appearance: alert and cooperative Resp: clear to auscultation bilaterally Cardio: regular rate and rhythm, S1, S2 normal, no murmur, click, rub or gallop GI: soft, non-tender; bowel sounds normal; no masses,  no organomegaly Incision/Wound: Wound healing well without evidence of induration or thickening.  Disposition:   Discharge Instructions    Diet - low sodium heart healthy   Complete by:  As directed    Discharge instructions   Complete by:  As directed    OK to shower.  No driving until pain free.  No lifting over 10 pounds.  Diet as tolerated. Avoid large quantities of raw vegetables for the next week.  Tylenol: If needed for soreness.  Percocet (oxycodone): If needed for pain.   NOTE: New metoprolol dose (lower).   Increase activity slowly   Complete by:  As directed      Allergies as of 10/12/2017      Reactions   Doxycycline Nausea And Vomiting   Sulfur Diarrhea, Nausea And Vomiting      Medication List    STOP taking these medications   amoxicillin-clavulanate 500-125 MG tablet Commonly known as:  AUGMENTIN   dicyclomine 10 MG capsule Commonly  known as:  BENTYL   metoprolol succinate 50 MG 24 hr tablet Commonly known as:  TOPROL-XL   metroNIDAZOLE 500 MG tablet Commonly known as:  FLAGYL   neomycin 500 MG tablet Commonly known as:  MYCIFRADIN     TAKE these medications   Glycopyrrolate-Formoterol 9-4.8 MCG/ACT Aero Commonly known as:  BEVESPI AEROSPHERE Inhale 2 puffs into the lungs 2 (two) times daily.   LORazepam 1 MG tablet Commonly known as:  ATIVAN TAKE 1 TABLET BY MOUTH ONCE DAILY AS NEEDED FOR ANXIETY   meloxicam 7.5 MG tablet Commonly known as:  MOBIC Take 1 tablet (7.5 mg total) by mouth daily.   metoprolol tartrate 25 MG tablet Commonly known as:  LOPRESSOR Take 0.5 tablets (12.5 mg total) by mouth daily.   mirtazapine 30 MG tablet Commonly known as:  REMERON Take 1 tablet (30 mg total) by mouth at bedtime.   multivitamin tablet Take 1 tablet by mouth daily.   omeprazole 20 MG capsule Commonly known as:  PRILOSEC Take 20 mg by mouth daily.   ondansetron 4 MG disintegrating tablet Commonly known as:  ZOFRAN ODT Allow 1-2 tablets to dissolve in your mouth every 8 hours as needed for nausea/vomiting   oxyCODONE-acetaminophen 5-325 MG tablet Commonly known as:  PERCOCET Take 1 tablet by mouth every 6 (six) hours as needed for severe pain.   polyethylene glycol packet Commonly known as:  MIRALAX / GLYCOLAX Take 17 g by mouth daily. What changed:  Another medication with the same name was removed. Continue taking this medication, and follow the directions you see here. **Note De-Identified Tenaglia Obfuscation** potassium chloride 10 MEQ tablet Commonly known as:  K-DUR Take 1 tablet (10 mEq total) by mouth daily. What changed:  when to take this      Follow-up Information    Doniven Vanpatten, Forest Gleason, MD Follow up in 1 week(s).   Specialties:  General Surgery, Radiology Contact information: [address] [place] [address] [phone]           Signed: [name] 10/16/2017, 10:16 AM

## 2017-10-19 ENCOUNTER — Ambulatory Visit (INDEPENDENT_AMBULATORY_CARE_PROVIDER_SITE_OTHER): Payer: PRIVATE HEALTH INSURANCE | Admitting: General Surgery

## 2017-10-19 ENCOUNTER — Encounter: Payer: Self-pay | Admitting: General Surgery

## 2017-10-19 VITALS — BP 118/70 | HR 79 | Resp 12 | Ht 59.0 in | Wt 90.5 lb

## 2017-10-19 DIAGNOSIS — K5732 Diverticulitis of large intestine without perforation or abscess without bleeding: Secondary | ICD-10-CM

## 2017-10-19 NOTE — Progress Notes (Signed)
**Note De-Identified Gallion Obfuscation** Patient ID: Haley [name], female   DOB: 12-25-1960, 57 y.o.   MRN: [id number]  Chief Complaint  Patient presents with  . Routine Post Op    HPI Haley [name] is a 57 y.o. female here today for her post op colectomy done on 10/08/2017. Patient states she is doing well.  Patient is staying with her mother.  Both are recovering from the recent loss of their father's/spouse.  She reports she is eating fairly well.  Did have some mild constipation requiring milk of magnesia x1.  No fever, chills or dysuria. HPI  Past Medical History:  Diagnosis Date  . Abdominal pain 04/29/2015  . Acute diverticulitis of intestine 03/18/2017  . Acute pancreatitis 01/13/2017  . Acute sinusitis 09/01/2015  . Anemia   . Anxiety   . Benign neoplasm of sigmoid colon   . Breast cancer screening 07/08/2015  . CHF (congestive heart failure) ([place])    while hospitalized with pneumonia  . Chronic pain syndrome   . COPD (chronic obstructive pulmonary disease) ([place])    while hospitalized  . Cyclic vomiting syndrome   . Cyclical vomiting   . Diverticulitis of large intestine with abscess without bleeding   . Dysphagia   . GERD (gastroesophageal reflux disease)    controlled  . Hematuria, microscopic 04/26/2015   Urine Sept 2016; rechecked still present; refer to urologist; hx of "polyps" in bladder 25 years ago (1991-ish)   . History of pneumonia   . History of sepsis   . Hypertension    controlled on meds  . Hypokalemia 04/24/2015  . IBS (irritable bowel syndrome) 02/02/2017  . Leukocytosis   . Low serum potassium 01/13/2017  . Marital stress 07/12/2015   Separated from husband Fall 2016   . Moderate COPD (chronic obstructive pulmonary disease) ([place])   . Osteopenia Nov 2015  . Pain due to dental caries 05/09/2015  . Pneumonia 4 years ago  . Recurrent depressive disorder, current episode moderate ([place]) 04/24/2015  . Severe depression ([place]) 05/12/2016  . Special screening for malignant neoplasms, colon   .  Tachycardia   . Thrombocytosis ([place])   . Vitamin D deficiency disease     Past Surgical History:  Procedure Laterality Date  . CESAREAN SECTION  09/27/79, 08/29/83   x 2  . COLON RESECTION N/A 10/08/2017   Procedure: LAPAROSCOPIC SIGMOID COLON RESECTION Converted to Open Sigmoid Colectomy;  Surgeon: [name], MD;  Location: [facility];  Service: General;  Laterality: N/A;  . COLONOSCOPY WITH PROPOFOL N/A 01/09/2016   Procedure: COLONOSCOPY WITH PROPOFOL;  Surgeon: Lucilla Lame, MD;  Location: [place];  Service: Endoscopy;  Laterality: N/A;  . POLYPECTOMY  01/09/2016   Procedure: POLYPECTOMY;  Surgeon: Lucilla Lame, MD;  Location: [place];  Service: Endoscopy;;    Family History  Problem Relation Age of Onset  . Cancer Father        prostate  . Heart disease Father   . Heart attack Father   . Hypertension Father   . Stroke Neg Hx   . COPD Neg Hx     Social History Social History   Tobacco Use  . Smoking status: Current Every Day Smoker    Packs/day: 1.00    Years: 35.00    Pack years: 35.00    Types: Cigarettes    Last attempt to quit: 08/03/2013    Years since quitting: 4.2  . Smokeless tobacco: Never Used  Substance Use Topics  . Alcohol use: No **Note De-Identified Fitzsimons Obfuscation** Frequency: Never    Comment: occasional  . Drug use: No    Allergies  Allergen Reactions  . Doxycycline Nausea And Vomiting  . Sulfur Diarrhea and Nausea And Vomiting    Current Outpatient Medications  Medication Sig Dispense Refill  . Glycopyrrolate-Formoterol (BEVESPI AEROSPHERE) 9-4.8 MCG/ACT AERO Inhale 2 puffs into the lungs 2 (two) times daily. 3 Inhaler 3  . LORazepam (ATIVAN) 1 MG tablet TAKE 1 TABLET BY MOUTH ONCE DAILY AS NEEDED FOR ANXIETY 30 tablet 0  . meloxicam (MOBIC) 7.5 MG tablet Take 1 tablet (7.5 mg total) by mouth daily. 30 tablet 1  . metoprolol tartrate (LOPRESSOR) 25 MG tablet Take 0.5 tablets (12.5 mg total) by mouth daily. 30 tablet 0  . mirtazapine (REMERON) 30 MG  tablet Take 1 tablet (30 mg total) by mouth at bedtime. 30 tablet 6  . Multiple Vitamin (MULTIVITAMIN) tablet Take 1 tablet by mouth daily.    Marland Kitchen omeprazole (PRILOSEC) 20 MG capsule Take 20 mg by mouth daily.    . ondansetron (ZOFRAN ODT) 4 MG disintegrating tablet Allow 1-2 tablets to dissolve in your mouth every 8 hours as needed for nausea/vomiting 30 tablet 0  . oxyCODONE-acetaminophen (PERCOCET) 5-325 MG tablet Take 1 tablet by mouth every 6 (six) hours as needed for severe pain. 30 tablet 0  . polyethylene glycol (MIRALAX / GLYCOLAX) packet Take 17 g by mouth daily. 14 each 6  . potassium chloride (K-DUR) 10 MEQ tablet Take 1 tablet (10 mEq total) by mouth daily. 40 tablet 0   No current facility-administered medications for this visit.     Review of Systems Review of Systems  Constitutional: Negative.   Respiratory: Negative.   Cardiovascular: Negative.   Gastrointestinal: Negative.     Blood pressure 118/70, pulse 79, resp. rate 12, height 4\' 11"  (1.499 m), weight 90 lb 8 oz (41.1 kg).  Physical Exam Physical Exam  Constitutional: She is oriented to person, place, and time. She appears well-developed and well-nourished.  Cardiovascular: Normal rate, regular rhythm and normal heart sounds.  Pulmonary/Chest: Effort normal and breath sounds normal.  Abdominal:    Incision site is clean and healing well.   Neurological: She is alert and oriented to person, place, and time.  Skin: Skin is warm and dry.    Data Reviewed October 08, 2017 sigmoid resection:  DIAGNOSIS:  A. SIGMOID COLON; SIGMOID COLECTOMY:  - DIVERTICULITIS WITH ADJACENT ABSCESS, FAT NECROSIS AND GIANT CELL  REACTION, SUGGESTIVE OF PERFORATION.  - HYPERPLASTIC POLYP.  - NEGATIVE FOR DYSPLASIA AND MALIGNANCY.  - UNREMARKABLE RESECTION MARGINS.   Assessment    Doing well post sigmoid colectomy.    Plan  The patient reports she has had some occasional cramping in her buttocks, lower abdomen similar to  what she experienced prior to menses.  I anticipate this will resolve, likely some postinflammatory changes in the pelvis.  Patient to return in one month.Work on gaining some weight. The patient is aware to call back for any questions or concerns.   HPI, Physical Exam, Assessment and Plan have been scribed under the direction and in the presence of Hervey Ard, MD.  Gaspar Cola, CMA  I have completed the exam and reviewed the above documentation for accuracy and completeness.  I agree with the above.  Haematologist has been used and any errors in dictation or transcription are unintentional.  Hervey Ard, M.D., F.A.C.S.  Forest Gleason Byrnett 10/19/2017, 1:06 PM

## 2017-10-19 NOTE — Patient Instructions (Signed)
**Note De-Identified Villamizar Obfuscation** Patient to return in one month.Work on gaining some weight. The patient is aware to call back for any questions or concerns.

## 2017-10-29 ENCOUNTER — Ambulatory Visit: Payer: 59 | Admitting: Unknown Physician Specialty

## 2017-11-03 ENCOUNTER — Ambulatory Visit (INDEPENDENT_AMBULATORY_CARE_PROVIDER_SITE_OTHER): Payer: 59 | Admitting: Unknown Physician Specialty

## 2017-11-03 ENCOUNTER — Encounter: Payer: Self-pay | Admitting: Unknown Physician Specialty

## 2017-11-03 DIAGNOSIS — E876 Hypokalemia: Secondary | ICD-10-CM | POA: Diagnosis not present

## 2017-11-03 DIAGNOSIS — K5720 Diverticulitis of large intestine with perforation and abscess without bleeding: Secondary | ICD-10-CM

## 2017-11-03 DIAGNOSIS — J449 Chronic obstructive pulmonary disease, unspecified: Secondary | ICD-10-CM | POA: Diagnosis not present

## 2017-11-03 DIAGNOSIS — D649 Anemia, unspecified: Secondary | ICD-10-CM

## 2017-11-03 DIAGNOSIS — F322 Major depressive disorder, single episode, severe without psychotic features: Secondary | ICD-10-CM | POA: Diagnosis not present

## 2017-11-03 NOTE — Assessment & Plan Note (Signed)
**Note De-Identified Sullivant Obfuscation** S/p colectomy.  Doing well

## 2017-11-03 NOTE — Assessment & Plan Note (Signed)
**Note De-Identified Grays Obfuscation** Stable.  Inhalers as tolerated

## 2017-11-03 NOTE — Assessment & Plan Note (Signed)
**Note De-Identified Bendorf Obfuscation** Probably due to surgery.  Recheck today

## 2017-11-03 NOTE — Progress Notes (Signed)
**Note De-Identified Pyon Obfuscation** BP 105/74   Pulse (!) 101   Temp 98.7 F (37.1 C) (Oral)   Ht 4\' 11"  (1.499 m)   Wt 90 lb 9.6 oz (41.1 kg)   SpO2 97%   BMI 18.30 kg/m    Subjective:    Patient ID: Haley [name], female    DOB: [birth year], 57 y.o.   MRN: [id number]  HPI: Haley [name] is a 57 y.o. female  Chief Complaint  Patient presents with  . Follow-up   Hospital f/u delayed due to father's illness.    Abdominal surgery Pt with significant history of diverticulitis.  Had colectomy by Dr. Fleet Contras done without need of colostomy. She is slowly improving   Notes reviewed.  Anemia on discharge s/p major surgery.  Energy level mostly tired but some days better.    Depression Doing much better.  Has a good self-help book.  Depression screen [facility] 2/9 11/03/2017 05/28/2017 02/02/2017 11/13/2016 10/13/2016  Decreased Interest 1 3 2  0 3  Down, Depressed, Hopeless 0 3 1 0 3  PHQ - 2 Score 1 6 3  0 6  Altered sleeping 1 2 2  - 3  Tired, decreased energy 1 3 1  - 3  Change in appetite 1 2 1  - 3  Feeling bad or failure about yourself  0 0 0 - 1  Trouble concentrating 0 2 1 - 1  Moving slowly or fidgety/restless 0 0 0 - 0  Suicidal thoughts 0 0 0 - 0  PHQ-9 Score 4 15 8  - 17  Difficult doing work/chores - - - - -   COPD Not taking inhalers but feeling OK.  Breathing not a problem at this time  Relevant past medical, surgical, family and social history reviewed and updated as indicated. Interim medical history since our last visit reviewed. Allergies and medications reviewed and updated.  Review of Systems  Constitutional: Positive for fatigue.  Respiratory: Negative.   Cardiovascular: Negative.     Per HPI unless specifically indicated above     Objective:    BP 105/74   Pulse (!) 101   Temp 98.7 F (37.1 C) (Oral)   Ht 4\' 11"  (1.499 m)   Wt 90 lb 9.6 oz (41.1 kg)   SpO2 97%   BMI 18.30 kg/m   Wt Readings from Last 3 Encounters:  11/03/17 90 lb 9.6 oz (41.1 kg)  10/19/17 90 lb 8 oz (41.1 kg)    10/08/17 102 lb (46.3 kg)    Physical Exam  Constitutional: She is oriented to person, place, and time. She appears well-developed and well-nourished. No distress.  HENT:  Head: Normocephalic and atraumatic.  Eyes: Conjunctivae and lids are normal. Right eye exhibits no discharge. Left eye exhibits no discharge. No scleral icterus.  Neck: Normal range of motion. Neck supple. No JVD present. Carotid bruit is not present.  Cardiovascular: Normal rate, regular rhythm and normal heart sounds.  Pulmonary/Chest: Effort normal and breath sounds normal.  Abdominal: Normal appearance. There is no splenomegaly or hepatomegaly.  Musculoskeletal: Normal range of motion.  Neurological: She is alert and oriented to person, place, and time.  Skin: Skin is warm, dry and intact. No rash noted. No pallor.  Well healed abdominal scar  Psychiatric: She has a normal mood and affect. Her behavior is normal. Judgment and thought content normal.    Results for orders placed or performed during the hospital encounter of 10/08/17  Aerobic/Anaerobic Culture (surgical/deep wound)  Result Value Ref Range   Specimen Description **Note De-Identified Rocha Obfuscation** ABSCESS Performed at [facility], [address] [place], [place] [address]    Special Requests      PELVIC Performed at [facility], [place], [place] [address]    Gram Stain      FEW WBC PRESENT,BOTH PMN AND MONONUCLEAR NO ORGANISMS SEEN    Culture      RARE ESCHERICHIA COLI NO ANAEROBES ISOLATED Performed at [facility], [place] [address], [place], [place] [address]    Report Status 10/13/2017 FINAL    Organism ID, Bacteria ESCHERICHIA COLI       Susceptibility   Escherichia coli - MIC*    AMPICILLIN <=2 SENSITIVE Sensitive     CEFAZOLIN <=4 SENSITIVE Sensitive     CEFEPIME <=1 SENSITIVE Sensitive     CEFTAZIDIME <=1 SENSITIVE Sensitive     CEFTRIAXONE <=1 SENSITIVE Sensitive     CIPROFLOXACIN <=0.25 SENSITIVE Sensitive      GENTAMICIN <=1 SENSITIVE Sensitive     IMIPENEM <=0.25 SENSITIVE Sensitive     TRIMETH/SULFA <=20 SENSITIVE Sensitive     AMPICILLIN/SULBACTAM <=2 SENSITIVE Sensitive     PIP/TAZO <=4 SENSITIVE Sensitive     Extended ESBL NEGATIVE Sensitive     * RARE ESCHERICHIA COLI  CBC with Differential/Platelet  Result Value Ref Range   WBC 9.1 3.6 - 11.0 K/uL   RBC 3.08 (L) 3.80 - 5.20 MIL/uL   Hemoglobin 9.9 (L) 12.0 - 16.0 g/dL   HCT 27.8 (L) 35.0 - 47.0 %   MCV 90.4 80.0 - 100.0 fL   MCH 32.3 26.0 - 34.0 pg   MCHC 35.7 32.0 - 36.0 g/dL   RDW 14.5 11.5 - 14.5 %   Platelets 277 150 - 440 K/uL   Neutrophils Relative % 68 %   Neutro Abs 6.2 1.4 - 6.5 K/uL   Lymphocytes Relative 24 %   Lymphs Abs 2.2 1.0 - 3.6 K/uL   Monocytes Relative 6 %   Monocytes Absolute 0.5 0.2 - 0.9 K/uL   Eosinophils Relative 1 %   Eosinophils Absolute 0.1 0 - 0.7 K/uL   Basophils Relative 1 %   Basophils Absolute 0.0 0 - 0.1 K/uL  Basic metabolic panel  Result Value Ref Range   Sodium 140 135 - 145 mmol/L   Potassium 3.6 3.5 - 5.1 mmol/L   Chloride 108 101 - 111 mmol/L   CO2 25 22 - 32 mmol/L   Glucose, Bld 90 65 - 99 mg/dL   BUN 6 6 - 20 mg/dL   Creatinine, Ser 0.47 0.44 - 1.00 mg/dL   Calcium 8.3 (L) 8.9 - 10.3 mg/dL   GFR calc non Af Amer >60 >60 mL/min   GFR calc Af Amer >60 >60 mL/min   Anion gap 7 5 - 15  I-STAT 4, (NA,K, GLUC, HGB,HCT)  Result Value Ref Range   Sodium 140 135 - 145 mmol/L   Potassium 4.0 3.5 - 5.1 mmol/L   Glucose, Bld 104 (H) 65 - 99 mg/dL   HCT 41.0 36.0 - 46.0 %   Hemoglobin 13.9 12.0 - 15.0 g/dL  Surgical pathology  Result Value Ref Range   SURGICAL PATHOLOGY      Surgical Pathology CASE: [id number] PATIENT: Haley [name] Surgical Pathology Report     SPECIMEN SUBMITTED: A. Colon, sigmoid  CLINICAL HISTORY: None provided  PRE-OPERATIVE DIAGNOSIS: Diverticulitis  POST-OPERATIVE DIAGNOSIS: Same as pre-op     DIAGNOSIS: A.  SIGMOID COLON; SIGMOID  COLECTOMY: - DIVERTICULITIS WITH ADJACENT ABSCESS, FAT **Note De-Identified Nesler Obfuscation** NECROSIS AND GIANT CELL REACTION, SUGGESTIVE OF PERFORATION. - HYPERPLASTIC POLYP. - NEGATIVE FOR DYSPLASIA AND MALIGNANCY. - UNREMARKABLE RESECTION MARGINS.   GROSS DESCRIPTION:  A. Labeled: sigmoid colon  Measurement : 12 cm long 4.5 cm in circumference  Specimen Integrity (Describe any perforations): received open  Orientation: no orientation  External surface: diffusely shaggy with multiple fibrous adhesions and ecchymosis  Other remarkable findings: multiple diverticuli, a 0.3 x 0.2 cm polyp 3.3 cm from closest margin  Lymph nodes: none grossly identified  Block Summary: 1-2-en face mucosal margin s 3-entire polyp 4-5-representative diverticuli  Final Diagnosis performed by Delorse Lek, MD.  Electronically signed 10/12/2017 6:13:11PM    The electronic signature indicates that the named Attending Pathologist has evaluated the specimen  Technical component performed at [facility], [address] [place], [place] [address] Lab: [phone] Dir: Rush Farmer, MD, MMM  Professional component performed at [facility], [facility], [place], [place], [place] [address] Lab: [phone] Dir: Dellia Nims. Reuel Derby, MD        Assessment & Plan:   Problem List Items Addressed This Visit      Unprioritized   Anemia    Probably due to surgery.  Recheck today      Relevant Orders   CBC with Differential/Platelet   Diverticulitis of large intestine with abscess without bleeding    S/p colectomy.  Doing well      Hypokalemia    Taking K supplements.  Wondering if she should continue.  Check K level      Relevant Orders   Comprehensive metabolic panel   Moderate COPD (chronic obstructive pulmonary disease) (HCC)    Stable.  Inhalers as tolerated      Severe depression ([place])    In remission.  Taking Remeron          Follow up plan: Return for physical.

## 2017-11-03 NOTE — Assessment & Plan Note (Addendum)
**Note De-Identified Otterness Obfuscation** Taking K supplements.  Wondering if she should continue.  Check K level

## 2017-11-03 NOTE — Assessment & Plan Note (Signed)
**Note De-Identified Provence Obfuscation** In remission.  Taking Remeron

## 2017-11-04 LAB — COMPREHENSIVE METABOLIC PANEL
ALBUMIN: 4.3 g/dL (ref 3.5–5.5)
ALT: 11 IU/L (ref 0–32)
AST: 16 IU/L (ref 0–40)
Albumin/Globulin Ratio: 1.5 (ref 1.2–2.2)
Alkaline Phosphatase: 94 IU/L (ref 39–117)
BILIRUBIN TOTAL: 0.3 mg/dL (ref 0.0–1.2)
BUN/Creatinine Ratio: 13 (ref 9–23)
BUN: 9 mg/dL (ref 6–24)
CHLORIDE: 103 mmol/L (ref 96–106)
CO2: 23 mmol/L (ref 20–29)
Calcium: 9.6 mg/dL (ref 8.7–10.2)
Creatinine, Ser: 0.67 mg/dL (ref 0.57–1.00)
GFR calc non Af Amer: 97 mL/min/{1.73_m2} (ref >59)
GFR, EST AFRICAN AMERICAN: 112 mL/min/{1.73_m2} (ref >59)
Globulin, Total: 2.8 g/dL (ref 1.5–4.5)
Glucose: 83 mg/dL (ref 65–99)
Potassium: 4.9 mmol/L (ref 3.5–5.2)
Sodium: 140 mmol/L (ref 134–144)
TOTAL PROTEIN: 7.1 g/dL (ref 6.0–8.5)

## 2017-11-04 LAB — CBC WITH DIFFERENTIAL/PLATELET
BASOS ABS: 0.1 10*3/uL (ref 0.0–0.2)
Basos: 1 %
EOS (ABSOLUTE): 0.2 10*3/uL (ref 0.0–0.4)
Eos: 3 %
HEMOGLOBIN: 12.9 g/dL (ref 11.1–15.9)
Hematocrit: 38.3 % (ref 34.0–46.6)
IMMATURE GRANS (ABS): 0 10*3/uL (ref 0.0–0.1)
Immature Granulocytes: 0 %
LYMPHS: 26 %
Lymphocytes Absolute: 1.7 10*3/uL (ref 0.7–3.1)
MCH: 31.1 pg (ref 26.6–33.0)
MCHC: 33.7 g/dL (ref 31.5–35.7)
MCV: 92 fL (ref 79–97)
MONOCYTES: 7 %
Monocytes Absolute: 0.5 10*3/uL (ref 0.1–0.9)
NEUTROS PCT: 63 %
Neutrophils Absolute: 4.2 10*3/uL (ref 1.4–7.0)
PLATELETS: 471 10*3/uL — AB (ref 150–379)
RBC: 4.15 x10E6/uL (ref 3.77–5.28)
RDW: 14.9 % (ref 12.3–15.4)
WBC: 6.7 10*3/uL (ref 3.4–10.8)

## 2017-11-05 ENCOUNTER — Encounter: Payer: Self-pay | Admitting: Unknown Physician Specialty

## 2017-11-18 ENCOUNTER — Ambulatory Visit (INDEPENDENT_AMBULATORY_CARE_PROVIDER_SITE_OTHER): Payer: PRIVATE HEALTH INSURANCE | Admitting: General Surgery

## 2017-11-18 ENCOUNTER — Encounter: Payer: Self-pay | Admitting: General Surgery

## 2017-11-18 VITALS — BP 116/78 | HR 89 | Resp 12 | Ht 60.0 in | Wt 91.0 lb

## 2017-11-18 DIAGNOSIS — K5732 Diverticulitis of large intestine without perforation or abscess without bleeding: Secondary | ICD-10-CM

## 2017-11-18 NOTE — Patient Instructions (Signed)
**Note De-identified Waldorf Obfuscation** Return in three months.

## 2017-11-18 NOTE — Progress Notes (Signed)
**Note De-Identified Dave Obfuscation** Patient ID: Haley [name], female   DOB: [birth year], 57 y.o.   MRN: [id number]  Chief Complaint  Patient presents with  . Follow-up    HPI Haley [name] is a 57 y.o. female here today for her post op sigmoid colectomy done on 10/08/2017.Patient states she is doing much better. Feels a pain in her left inguinal area. Pain comes and goes Moves her bowels every two days.  HPI  Past Medical History:  Diagnosis Date  . Abdominal pain 04/29/2015  . Acute diverticulitis of intestine 03/18/2017  . Acute pancreatitis 01/13/2017  . Acute sinusitis 09/01/2015  . Anemia   . Anxiety   . Benign neoplasm of sigmoid colon   . Breast cancer screening 07/08/2015  . CHF (congestive heart failure) ([place])    while hospitalized with pneumonia  . Chronic pain syndrome   . COPD (chronic obstructive pulmonary disease) ([place])    while hospitalized  . Cyclic vomiting syndrome   . Cyclical vomiting   . Diverticulitis of large intestine with abscess without bleeding   . Dysphagia   . GERD (gastroesophageal reflux disease)    controlled  . Hematuria, microscopic 04/26/2015   Urine Sept 2016; rechecked still present; refer to urologist; hx of "polyps" in bladder 25 years ago (1991-ish)   . History of pneumonia   . History of sepsis   . Hypertension    controlled on meds  . Hypokalemia 04/24/2015  . IBS (irritable bowel syndrome) 02/02/2017  . Leukocytosis   . Low serum potassium 01/13/2017  . Marital stress 07/12/2015   Separated from husband Fall 2016   . Moderate COPD (chronic obstructive pulmonary disease) ([place])   . Osteopenia Nov 2015  . Pain due to dental caries 05/09/2015  . Pneumonia 4 years ago  . Recurrent depressive disorder, current episode moderate ([place]) 04/24/2015  . Severe depression ([place]) 05/12/2016  . Special screening for malignant neoplasms, colon   . Tachycardia   . Thrombocytosis ([place])   . Vitamin D deficiency disease     Past Surgical History:  Procedure Laterality Date  . CESAREAN SECTION   09/27/79, 08/29/83   x 2  . COLON RESECTION N/A 10/08/2017   Procedure: LAPAROSCOPIC SIGMOID COLON RESECTION Converted to Open Sigmoid Colectomy;  Surgeon: [name], MD;  Location: [facility];  Service: General;  Laterality: N/A;  . COLONOSCOPY WITH PROPOFOL N/A 01/09/2016   Procedure: COLONOSCOPY WITH PROPOFOL;  Surgeon: Lucilla Lame, MD;  Location: [facility];  Service: Endoscopy;  Laterality: N/A;  . POLYPECTOMY  01/09/2016   Procedure: POLYPECTOMY;  Surgeon: Lucilla Lame, MD;  Location: [facility];  Service: Endoscopy;;    Family History  Problem Relation Age of Onset  . Cancer Father        prostate  . Heart disease Father   . Heart attack Father   . Hypertension Father   . Stroke Neg Hx   . COPD Neg Hx     Social History Social History   Tobacco Use  . Smoking status: Current Every Day Smoker    Packs/day: 1.00    Years: 35.00    Pack years: 35.00    Types: Cigarettes    Last attempt to quit: 08/03/2013    Years since quitting: 4.2  . Smokeless tobacco: Never Used  Substance Use Topics  . Alcohol use: No    Frequency: Never    Comment: occasional  . Drug use: No    Allergies  Allergen Reactions  . Doxycycline **Note De-Identified Frumkin Obfuscation** Nausea And Vomiting  . Sulfur Diarrhea and Nausea And Vomiting    Current Outpatient Medications  Medication Sig Dispense Refill  . Glycopyrrolate-Formoterol (BEVESPI AEROSPHERE) 9-4.8 MCG/ACT AERO Inhale 2 puffs into the lungs 2 (two) times daily. 3 Inhaler 3  . LORazepam (ATIVAN) 1 MG tablet TAKE 1 TABLET BY MOUTH ONCE DAILY AS NEEDED FOR ANXIETY 30 tablet 0  . metoprolol tartrate (LOPRESSOR) 25 MG tablet Take 0.5 tablets (12.5 mg total) by mouth daily. 30 tablet 0  . mirtazapine (REMERON) 30 MG tablet Take 1 tablet (30 mg total) by mouth at bedtime. 30 tablet 6  . Multiple Vitamin (MULTIVITAMIN) tablet Take 1 tablet by mouth daily.    Marland Kitchen omeprazole (PRILOSEC) 20 MG capsule Take 20 mg by mouth daily.    . potassium chloride (K-DUR) 10  MEQ tablet Take 1 tablet (10 mEq total) by mouth daily. 40 tablet 0   No current facility-administered medications for this visit.     Review of Systems Review of Systems  Constitutional: Negative.   Respiratory: Negative.   Cardiovascular: Negative.     Blood pressure 116/78, pulse 89, resp. rate 12, height 5' (1.524 m), weight 91 lb (41.3 kg).  Physical Exam Physical Exam  Constitutional: She is oriented to person, place, and time. She appears well-developed and well-nourished.  Eyes: Conjunctivae are normal. No scleral icterus.  Neck: Neck supple.  Cardiovascular: Normal rate, regular rhythm and normal heart sounds.  Pulmonary/Chest: Effort normal and breath sounds normal.  Abdominal: Soft. Bowel sounds are normal. There is no tenderness.    Lymphadenopathy:    She has no cervical adenopathy.  Neurological: She is alert and oriented to person, place, and time.  Skin: Skin is warm and dry.    Data Reviewed Left ovary was seen at the time of surgery and is atrophic consistent with age.  No dilatation of the periovarian tissue.  Assessment    Doing well post sigmoid colectomy for chronic diverticulitis.  Left lower quadrant pain, likely musculoskeletal rather than ovarian source.    Plan  Return in three months The patient is aware to call back for any questions or concerns.   HPI, Physical Exam, Assessment and Plan have been scribed under the direction and in the presence of Hervey Ard, MD.  Gaspar Cola, CMA  I have completed the exam and reviewed the above documentation for accuracy and completeness.  I agree with the above.  Haematologist has been used and any errors in dictation or transcription are unintentional.  Hervey Ard, M.D., F.A.C.S.  Forest Gleason Byrnett 11/18/2017, 2:31 PM

## 2017-12-27 ENCOUNTER — Other Ambulatory Visit: Payer: Self-pay | Admitting: Unknown Physician Specialty

## 2017-12-30 ENCOUNTER — Other Ambulatory Visit: Payer: Self-pay | Admitting: Unknown Physician Specialty

## 2017-12-30 ENCOUNTER — Other Ambulatory Visit: Payer: Self-pay | Admitting: General Surgery

## 2017-12-30 NOTE — Telephone Encounter (Signed)
**Note De-Identified Miler Obfuscation** Refilled.  No further refills without office visit

## 2017-12-30 NOTE — Telephone Encounter (Signed)
**Note De-Identified Gockel Obfuscation** Refill request lorazepam 1 mg  LOV 11/03/2017 Haley [name]  Last filled 09/28/2017 30 tabs NR  Pharmacy on File

## 2017-12-31 ENCOUNTER — Other Ambulatory Visit: Payer: Self-pay

## 2017-12-31 MED ORDER — METOPROLOL TARTRATE 25 MG PO TABS: 12.5000 mg | ORAL_TABLET | Freq: Every day | ORAL | 0 refills | Status: DC

## 2017-12-31 NOTE — Telephone Encounter (Signed)
**Note De-Identified Panjwani Obfuscation** Patient last seen 11/03/17 and has appointment 01/05/18.

## 2018-01-05 ENCOUNTER — Ambulatory Visit: Payer: 59 | Admitting: Unknown Physician Specialty

## 2018-01-10 ENCOUNTER — Encounter: Payer: Self-pay | Admitting: Unknown Physician Specialty

## 2018-01-10 ENCOUNTER — Ambulatory Visit (INDEPENDENT_AMBULATORY_CARE_PROVIDER_SITE_OTHER): Payer: 59 | Admitting: Unknown Physician Specialty

## 2018-01-10 VITALS — BP 136/86 | HR 96 | Temp 98.4°F | Ht 60.0 in | Wt 93.0 lb

## 2018-01-10 DIAGNOSIS — F419 Anxiety disorder, unspecified: Secondary | ICD-10-CM

## 2018-01-10 DIAGNOSIS — J301 Allergic rhinitis due to pollen: Secondary | ICD-10-CM

## 2018-01-10 MED ORDER — FLUTICASONE PROPIONATE 50 MCG/ACT NA SUSP: 2.0000 | Freq: Every day | NASAL | 6 refills | Status: DC

## 2018-01-10 NOTE — Progress Notes (Signed)
**Note De-Identified Chimenti Obfuscation** BP 136/86   Pulse 96   Temp 98.4 F (36.9 C) (Oral)   Ht 5' (1.524 m)   Wt 93 lb (42.2 kg)   SpO2 97%   BMI 18.16 kg/m    Subjective:    Patient ID: Haley [name], female    DOB: [birth year], 57 y.o.   MRN: [id number]  HPI: Haley [name] is a 57 y.o. female  Chief Complaint  Patient presents with  . URI    pt states she has had congestion, sinus pressure, ear pain, and drainage for about 2 weeks    Sinusitis  This is a new problem. Episode onset: 2 weeks. The problem is unchanged. There has been no fever. She is experiencing no pain. Associated symptoms include congestion, ear pain, sinus pressure and sneezing. Pertinent negatives include no chills, coughing, diaphoresis, headaches, hoarse voice, neck pain, shortness of breath, sore throat or swollen glands. Treatments tried: benadryl. The treatment provided no relief.   Anxiety/depression Takes Mirtazapine and working well.  Uses Lorazepam on occasion.  #30 lasts 3 months Depression screen [facility] 2/9 11/03/2017 05/28/2017 02/02/2017 11/13/2016 10/13/2016  Decreased Interest 1 3 2  0 3  Down, Depressed, Hopeless 0 3 1 0 3  PHQ - 2 Score 1 6 3  0 6  Altered sleeping 1 2 2  - 3  Tired, decreased energy 1 3 1  - 3  Change in appetite 1 2 1  - 3  Feeling bad or failure about yourself  0 0 0 - 1  Trouble concentrating 0 2 1 - 1  Moving slowly or fidgety/restless 0 0 0 - 0  Suicidal thoughts 0 0 0 - 0  PHQ-9 Score 4 15 8  - 17  Difficult doing work/chores - - - - -     Relevant past medical, surgical, family and social history reviewed and updated as indicated. Interim medical history since our last visit reviewed. Allergies and medications reviewed and updated.  Review of Systems  Constitutional: Negative for chills and diaphoresis.  HENT: Positive for congestion, ear pain, sinus pressure and sneezing. Negative for hoarse voice and sore throat.   Respiratory: Negative for cough and shortness of breath.   Musculoskeletal: Negative for  neck pain.  Neurological: Negative for headaches.    Per HPI unless specifically indicated above     Objective:    BP 136/86   Pulse 96   Temp 98.4 F (36.9 C) (Oral)   Ht 5' (1.524 m)   Wt 93 lb (42.2 kg)   SpO2 97%   BMI 18.16 kg/m   Wt Readings from Last 3 Encounters:  01/10/18 93 lb (42.2 kg)  11/18/17 91 lb (41.3 kg)  11/03/17 90 lb 9.6 oz (41.1 kg)    Physical Exam  Constitutional: She is oriented to person, place, and time. She appears well-developed and well-nourished. No distress.  HENT:  Head: Normocephalic and atraumatic.  Right Ear: Tympanic membrane and ear canal normal.  Left Ear: Tympanic membrane and ear canal normal.  Nose: No rhinorrhea. Right sinus exhibits maxillary sinus tenderness. Right sinus exhibits no frontal sinus tenderness. Left sinus exhibits maxillary sinus tenderness. Left sinus exhibits no frontal sinus tenderness.  Eyes: Conjunctivae and lids are normal. Right eye exhibits no discharge. Left eye exhibits no discharge. No scleral icterus.  Cardiovascular: Normal rate and regular rhythm.  Pulmonary/Chest: Effort normal and breath sounds normal. No respiratory distress.  Abdominal: Normal appearance. There is no splenomegaly or hepatomegaly.  Musculoskeletal: Normal range of motion.  Neurological: She **Note De-Identified Pe Obfuscation** is alert and oriented to person, place, and time.  Skin: Skin is intact. No rash noted. No pallor.  Psychiatric: She has a normal mood and affect. Her behavior is normal. Judgment and thought content normal.    Results for orders placed or performed in visit on 11/03/17  CBC with Differential/Platelet  Result Value Ref Range   WBC 6.7 3.4 - 10.8 x10E3/uL   RBC 4.15 3.77 - 5.28 x10E6/uL   Hemoglobin 12.9 11.1 - 15.9 g/dL   Hematocrit 38.3 34.0 - 46.6 %   MCV 92 79 - 97 fL   MCH 31.1 26.6 - 33.0 pg   MCHC 33.7 31.5 - 35.7 g/dL   RDW 14.9 12.3 - 15.4 %   Platelets 471 (H) 150 - 379 x10E3/uL   Neutrophils 63 Not Estab. %   Lymphs 26 Not  Estab. %   Monocytes 7 Not Estab. %   Eos 3 Not Estab. %   Basos 1 Not Estab. %   Neutrophils Absolute 4.2 1.4 - 7.0 x10E3/uL   Lymphocytes Absolute 1.7 0.7 - 3.1 x10E3/uL   Monocytes Absolute 0.5 0.1 - 0.9 x10E3/uL   EOS (ABSOLUTE) 0.2 0.0 - 0.4 x10E3/uL   Basophils Absolute 0.1 0.0 - 0.2 x10E3/uL   Immature Granulocytes 0 Not Estab. %   Immature Grans (Abs) 0.0 0.0 - 0.1 x10E3/uL  Comprehensive metabolic panel  Result Value Ref Range   Glucose 83 65 - 99 mg/dL   BUN 9 6 - 24 mg/dL   Creatinine, Ser 0.67 0.57 - 1.00 mg/dL   GFR calc non Af Amer 97 >59 mL/min/1.73   GFR calc Af Amer 112 >59 mL/min/1.73   BUN/Creatinine Ratio 13 9 - 23   Sodium 140 134 - 144 mmol/L   Potassium 4.9 3.5 - 5.2 mmol/L   Chloride 103 96 - 106 mmol/L   CO2 23 20 - 29 mmol/L   Calcium 9.6 8.7 - 10.2 mg/dL   Total Protein 7.1 6.0 - 8.5 g/dL   Albumin 4.3 3.5 - 5.5 g/dL   Globulin, Total 2.8 1.5 - 4.5 g/dL   Albumin/Globulin Ratio 1.5 1.2 - 2.2   Bilirubin Total 0.3 0.0 - 1.2 mg/dL   Alkaline Phosphatase 94 39 - 117 IU/L   AST 16 0 - 40 IU/L   ALT 11 0 - 32 IU/L      Assessment & Plan:   Problem List Items Addressed This Visit      Unprioritized   Anxiety    Stable on current Lorzazepam of #30 in a 3 month period of time.         Other Visit Diagnoses    Allergic rhinitis due to pollen, unspecified seasonality    -  Primary   New problem.  Rx for Flonase.  Take a daily Clariton.  Saline nasal spray and neti pot.         Follow up plan: Return if symptoms worsen or fail to improve.

## 2018-01-10 NOTE — Assessment & Plan Note (Signed)
**Note De-Identified Rocque Obfuscation** Stable on current Lorzazepam of #30 in a 3 month period of time.

## 2018-02-14 ENCOUNTER — Encounter: Payer: Self-pay | Admitting: Unknown Physician Specialty

## 2018-02-14 ENCOUNTER — Other Ambulatory Visit: Payer: Self-pay

## 2018-02-14 ENCOUNTER — Ambulatory Visit (INDEPENDENT_AMBULATORY_CARE_PROVIDER_SITE_OTHER): Payer: 59 | Admitting: Unknown Physician Specialty

## 2018-02-14 DIAGNOSIS — J3089 Other allergic rhinitis: Secondary | ICD-10-CM | POA: Diagnosis not present

## 2018-02-14 DIAGNOSIS — K859 Acute pancreatitis without necrosis or infection, unspecified: Secondary | ICD-10-CM

## 2018-02-14 DIAGNOSIS — F321 Major depressive disorder, single episode, moderate: Secondary | ICD-10-CM | POA: Insufficient documentation

## 2018-02-14 DIAGNOSIS — F419 Anxiety disorder, unspecified: Secondary | ICD-10-CM

## 2018-02-14 DIAGNOSIS — J309 Allergic rhinitis, unspecified: Secondary | ICD-10-CM | POA: Insufficient documentation

## 2018-02-14 MED ORDER — MIRTAZAPINE 30 MG PO TABS: 30.0000 mg | ORAL_TABLET | Freq: Every day | ORAL | 6 refills | Status: DC

## 2018-02-14 MED ORDER — CITALOPRAM HYDROBROMIDE 20 MG PO TABS: 20.0000 mg | ORAL_TABLET | Freq: Every day | ORAL | 3 refills | Status: DC

## 2018-02-14 NOTE — Assessment & Plan Note (Addendum)
**Note De-Identified Szymborski Obfuscation** Add Citalopram to present treatment.  Takes Lorazepam 1/2 on most nights.

## 2018-02-14 NOTE — Assessment & Plan Note (Signed)
**Note De-Identified Balint Obfuscation** Encouraged to switch to Fexofenadine which helped in the past

## 2018-02-14 NOTE — Progress Notes (Signed)
**Note De-Identified Halpin Obfuscation** BP 131/82   Pulse 71   Temp 98.6 F (37 C) (Oral)   Ht 5' (1.524 m)   Wt 92 lb 6 oz (41.9 kg)   SpO2 96%   BMI 18.04 kg/m    Subjective:    Patient ID: Haley [name], female    DOB: 09-05-1960, 57 y.o.   MRN: [id number]  HPI: Laquanda Bick Barz is a 57 y.o. female  Chief Complaint  Patient presents with  . Depression  . Anxiety  . Allergies    pt states she thinks medication for allergies is not working well    Depression Pt states ever since she can remember she had problems with anxiety and depression.  She states she worries and catastrophizes.  She admits to OCD.  She feels that her anxiety causes IBS with diarrhea and vomiting.  She has been on Serzone in the past which helped.  Taking Mirtazapine which has helped.  Has #30 Lorazepam which she takes #30 every few nights.  States she came off the Mirtazapine at one time and got worse.  Feels if her bowels and anxiety were under control she would be better.    Depression screen [facility] 2/9 02/14/2018 11/03/2017 05/28/2017 02/02/2017 11/13/2016  Decreased Interest 3 1 3 2  0  Down, Depressed, Hopeless 2 0 3 1 0  PHQ - 2 Score 5 1 6 3  0  Altered sleeping 3 1 2 2  -  Tired, decreased energy 3 1 3 1  -  Change in appetite 3 1 2 1  -  Feeling bad or failure about yourself  2 0 0 0 -  Trouble concentrating 2 0 2 1 -  Moving slowly or fidgety/restless 0 0 0 0 -  Suicidal thoughts 0 0 0 0 -  PHQ-9 Score 18 4 15 8  -  Difficult doing work/chores - - - - -   Allergies Describes constant drainage down the back of the throat.  Taking Clariton but Allegra helped at one time.    Relevant past medical, surgical, family and social history reviewed and updated as indicated. Interim medical history since our last visit reviewed. Allergies and medications reviewed and updated.  Review of Systems  Per HPI unless specifically indicated above     Objective:    BP 131/82   Pulse 71   Temp 98.6 F (37 C) (Oral)   Ht 5' (1.524 m)   Wt 92 lb 6 oz  (41.9 kg)   SpO2 96%   BMI 18.04 kg/m   Wt Readings from Last 3 Encounters:  02/14/18 92 lb 6 oz (41.9 kg)  01/10/18 93 lb (42.2 kg)  11/18/17 91 lb (41.3 kg)    Physical Exam  Constitutional: She is oriented to person, place, and time. She appears well-developed and well-nourished. No distress.  HENT:  Head: Normocephalic and atraumatic.  Eyes: Conjunctivae and lids are normal. Right eye exhibits no discharge. Left eye exhibits no discharge. No scleral icterus.  Neck: Normal range of motion. Neck supple. No JVD present. Carotid bruit is not present.  Cardiovascular: Normal rate, regular rhythm and normal heart sounds.  Pulmonary/Chest: Effort normal and breath sounds normal.  Abdominal: Normal appearance. There is no splenomegaly or hepatomegaly.  Musculoskeletal: Normal range of motion.  Neurological: She is alert and oriented to person, place, and time.  Skin: Skin is warm, dry and intact. No rash noted. No pallor.  Psychiatric: She has a normal mood and affect. Her behavior is normal. Judgment and thought content normal. **Note De-Identified Tacker Obfuscation** Results for orders placed or performed in visit on 11/03/17  CBC with Differential/Platelet  Result Value Ref Range   WBC 6.7 3.4 - 10.8 x10E3/uL   RBC 4.15 3.77 - 5.28 x10E6/uL   Hemoglobin 12.9 11.1 - 15.9 g/dL   Hematocrit 38.3 34.0 - 46.6 %   MCV 92 79 - 97 fL   MCH 31.1 26.6 - 33.0 pg   MCHC 33.7 31.5 - 35.7 g/dL   RDW 14.9 12.3 - 15.4 %   Platelets 471 (H) 150 - 379 x10E3/uL   Neutrophils 63 Not Estab. %   Lymphs 26 Not Estab. %   Monocytes 7 Not Estab. %   Eos 3 Not Estab. %   Basos 1 Not Estab. %   Neutrophils Absolute 4.2 1.4 - 7.0 x10E3/uL   Lymphocytes Absolute 1.7 0.7 - 3.1 x10E3/uL   Monocytes Absolute 0.5 0.1 - 0.9 x10E3/uL   EOS (ABSOLUTE) 0.2 0.0 - 0.4 x10E3/uL   Basophils Absolute 0.1 0.0 - 0.2 x10E3/uL   Immature Granulocytes 0 Not Estab. %   Immature Grans (Abs) 0.0 0.0 - 0.1 x10E3/uL  Comprehensive metabolic panel  Result  Value Ref Range   Glucose 83 65 - 99 mg/dL   BUN 9 6 - 24 mg/dL   Creatinine, Ser 0.67 0.57 - 1.00 mg/dL   GFR calc non Af Amer 97 >59 mL/min/1.73   GFR calc Af Amer 112 >59 mL/min/1.73   BUN/Creatinine Ratio 13 9 - 23   Sodium 140 134 - 144 mmol/L   Potassium 4.9 3.5 - 5.2 mmol/L   Chloride 103 96 - 106 mmol/L   CO2 23 20 - 29 mmol/L   Calcium 9.6 8.7 - 10.2 mg/dL   Total Protein 7.1 6.0 - 8.5 g/dL   Albumin 4.3 3.5 - 5.5 g/dL   Globulin, Total 2.8 1.5 - 4.5 g/dL   Albumin/Globulin Ratio 1.5 1.2 - 2.2   Bilirubin Total 0.3 0.0 - 1.2 mg/dL   Alkaline Phosphatase 94 39 - 117 IU/L   AST 16 0 - 40 IU/L   ALT 11 0 - 32 IU/L      Assessment & Plan:   Problem List Items Addressed This Visit      Unprioritized   Allergic rhinitis    Encouraged to switch to Fexofenadine which helped in the past      Anxiety    Add Citalopram to present treatment.  Takes Lorazepam 1/2 on most nights.        Depression, major, single episode, moderate ([facility])    Add Citalopram 20 mg to present treatment           Follow up plan: Return in about 1 month (around 03/14/2018) for With Adrianna.

## 2018-02-14 NOTE — Assessment & Plan Note (Signed)
**Note De-Identified Jenifer Obfuscation** Add Citalopram 20 mg to present treatment

## 2018-02-20 ENCOUNTER — Other Ambulatory Visit: Payer: Self-pay | Admitting: Unknown Physician Specialty

## 2018-02-22 ENCOUNTER — Ambulatory Visit (INDEPENDENT_AMBULATORY_CARE_PROVIDER_SITE_OTHER): Payer: PRIVATE HEALTH INSURANCE | Admitting: General Surgery

## 2018-02-22 ENCOUNTER — Encounter: Payer: Self-pay | Admitting: General Surgery

## 2018-02-22 VITALS — BP 130/70 | HR 97 | Resp 14 | Ht 60.0 in | Wt 97.0 lb

## 2018-02-22 DIAGNOSIS — K5732 Diverticulitis of large intestine without perforation or abscess without bleeding: Secondary | ICD-10-CM

## 2018-02-22 NOTE — Telephone Encounter (Signed)
**Note De-Identified Barbee Obfuscation** Rx refill request:   Lorazepam 1 mg        Last filled: 12/30/17  LOV: 02/14/18  PCP: [place]: verified

## 2018-02-22 NOTE — Patient Instructions (Addendum)
**Note De-identified Eunice Obfuscation** The patient is aware to call back for any questions or new concerns.  

## 2018-02-22 NOTE — Progress Notes (Signed)
**Note De-Identified [place] [facility]** Patient ID: Haley [name], female   DOB: 1960-09-24, 57 y.o.   MRN: [id number]  Chief Complaint  Patient presents with  . Follow-up    HPI Haley [name] is a 57 y.o. female.  Here for her 3 month follow up, sigmoid colectomy on 10-08-17. She states she is doing well. Bowels moving every 2 days. Denies any pain, diarrhea or constipation. She is avoiding corn, slaw, sesame seeds, and raw carrots. Weight is up 7 pounds since 11-03-17.  HPI  Past Medical History:  Diagnosis Date  . Abdominal pain 04/29/2015  . Acute diverticulitis of intestine 03/18/2017  . Acute pancreatitis 01/13/2017  . Acute sinusitis 09/01/2015  . Anemia   . Anxiety   . Benign neoplasm of sigmoid colon   . Breast cancer screening 07/08/2015  . CHF (congestive heart failure) ([place])    while hospitalized with pneumonia  . Chronic pain syndrome   . COPD (chronic obstructive pulmonary disease) ([name])    while hospitalized  . Cyclic vomiting syndrome   . Cyclical vomiting   . Diverticulitis of large intestine with abscess without bleeding   . Dysphagia   . GERD (gastroesophageal reflux disease)    controlled  . Hematuria, microscopic 04/26/2015   Urine Sept 2016; rechecked still present; refer to urologist; hx of "polyps" in bladder 25 years ago (1991-ish)   . History of pneumonia   . History of sepsis   . Hypertension    controlled on meds  . Hypokalemia 04/24/2015  . IBS (irritable bowel syndrome) 02/02/2017  . Leukocytosis   . Low serum potassium 01/13/2017  . Marital stress 07/12/2015   Separated from husband Fall 2016   . Moderate COPD (chronic obstructive pulmonary disease) ([place])   . Osteopenia Nov 2015  . Pain due to dental caries 05/09/2015  . Pneumonia 4 years ago  . Recurrent depressive disorder, current episode moderate ([place]) 04/24/2015  . Severe depression ([place]) 05/12/2016  . Special screening for malignant neoplasms, colon   . Tachycardia   . Thrombocytosis ([place])   . Vitamin D deficiency disease     Past  Surgical History:  Procedure Laterality Date  . CESAREAN SECTION  09/27/79, 08/29/83   x 2  . COLON RESECTION N/A 10/08/2017   Procedure: LAPAROSCOPIC SIGMOID COLON RESECTION Converted to Open Sigmoid Colectomy;  Surgeon: [name], MD;  Location: [facility];  Service: General;  Laterality: N/A;  . COLONOSCOPY WITH PROPOFOL N/A 01/09/2016   Procedure: COLONOSCOPY WITH PROPOFOL;  Surgeon: Lucilla Lame, MD;  Location: [facility];  Service: Endoscopy;  Laterality: N/A;  . POLYPECTOMY  01/09/2016   Procedure: POLYPECTOMY;  Surgeon: Lucilla Lame, MD;  Location: [facility];  Service: Endoscopy;;    Family History  Problem Relation Age of Onset  . Cancer Father        prostate  . Heart disease Father   . Heart attack Father   . Hypertension Father   . Stroke Neg Hx   . COPD Neg Hx     Social History Social History   Tobacco Use  . Smoking status: Current Every Day Smoker    Packs/day: 1.00    Years: 35.00    Pack years: 35.00    Types: Cigarettes    Last attempt to quit: 08/03/2013    Years since quitting: 4.5  . Smokeless tobacco: Never Used  Substance Use Topics  . Alcohol use: No    Frequency: Never    Comment: occasional  . Drug use: **Note De-Identified [place] [facility]** No    Allergies  Allergen Reactions  . Doxycycline Nausea And Vomiting  . Sulfur Diarrhea and Nausea And Vomiting    Current Outpatient Medications  Medication Sig Dispense Refill  . citalopram (CELEXA) 20 MG tablet Take 1 tablet (20 mg total) by mouth daily. 30 tablet 3  . fluticasone (FLONASE) 50 MCG/ACT nasal spray Place 2 sprays into both nostrils daily. 16 g 6  . metoprolol tartrate (LOPRESSOR) 25 MG tablet Take 0.5 tablets (12.5 mg total) by mouth daily. 30 tablet 0  . mirtazapine (REMERON) 30 MG tablet Take 1 tablet (30 mg total) by mouth at bedtime. 30 tablet 6  . Multiple Vitamin (MULTIVITAMIN) tablet Take 1 tablet by mouth daily.    Marland Kitchen omeprazole (PRILOSEC) 20 MG capsule Take 20 mg by mouth daily.    . potassium  chloride (K-DUR) 10 MEQ tablet Take 1 tablet (10 mEq total) by mouth daily. 40 tablet 0  . LORazepam (ATIVAN) 1 MG tablet TAKE 1 TABLET BY MOUTH ONCE DAILY AS NEEDED FOR ANXIETY 30 tablet 0   No current facility-administered medications for this visit.     Review of Systems Review of Systems  Constitutional: Negative.   Respiratory: Negative.   Cardiovascular: Negative.   Psychiatric/Behavioral: Hallucinations: .asascb.    Blood pressure 130/70, pulse 97, resp. rate 14, height 5' (1.524 m), weight 97 lb (44 kg).  Physical Exam Physical Exam  Constitutional: She is oriented to person, place, and time. She appears well-developed and well-nourished.  HENT:  Mouth/Throat: Oropharynx is clear and moist.  Eyes: No scleral icterus.  Neck: Neck supple.  Cardiovascular: Normal rate, regular rhythm and normal heart sounds.  Pulmonary/Chest: Effort normal and breath sounds normal.  Abdominal: Soft. Normal appearance. No hernia.  Neurological: She is alert and oriented to person, place, and time.  Skin: Skin is warm and dry.  Psychiatric: Her behavior is normal.    Data Reviewed October 08, 2017 surgical pathology: DIAGNOSIS:  A. SIGMOID COLON; SIGMOID COLECTOMY:  - DIVERTICULITIS WITH ADJACENT ABSCESS, FAT NECROSIS AND GIANT CELL  REACTION, SUGGESTIVE OF PERFORATION.  - HYPERPLASTIC POLYP.  - NEGATIVE FOR DYSPLASIA AND MALIGNANCY.  - UNREMARKABLE RESECTION MARGINS.   Outpatient laboratory studies dated November 03, 2017 showed resolution of the mild postoperative anemia with hemoglobin of 12.9.  Modest elevation of the platelet count of 471,000, improved from February of this year when it was 575,000.  Normal white blood cell count.  Comprehensive metabolic exam of the same date was normal.  Hyperplastic polyps removed from the sigmoid colon at the time of the January 09, 2016 colonoscopy completed by Lucilla Lame, MD  Assessment    Doing well post sigmoid resection for chronic  diverticulitis.    Plan    Follow up as needed. The patient is aware to call back for any questions or new concerns.      HPI, Physical Exam, Assessment and Plan have been scribed under the direction and in the presence of [name], MD. Karie Fetch, RN   I have completed the exam and reviewed the above documentation for accuracy and completeness.  I agree with the above.  Haematologist has been used and any errors in dictation or transcription are unintentional.  Hervey Ard, M.D., F.A.C.S.  Haley [name] 02/23/2018, 7:49 AM

## 2018-03-17 ENCOUNTER — Other Ambulatory Visit: Payer: Self-pay

## 2018-03-17 ENCOUNTER — Ambulatory Visit (INDEPENDENT_AMBULATORY_CARE_PROVIDER_SITE_OTHER): Payer: 59 | Admitting: Physician Assistant

## 2018-03-17 ENCOUNTER — Encounter: Payer: Self-pay | Admitting: Physician Assistant

## 2018-03-17 VITALS — BP 117/76 | HR 97 | Temp 99.2°F | Ht 60.0 in | Wt 97.2 lb

## 2018-03-17 DIAGNOSIS — F419 Anxiety disorder, unspecified: Secondary | ICD-10-CM

## 2018-03-17 DIAGNOSIS — I10 Essential (primary) hypertension: Secondary | ICD-10-CM

## 2018-03-17 DIAGNOSIS — G4700 Insomnia, unspecified: Secondary | ICD-10-CM

## 2018-03-17 MED ORDER — CITALOPRAM HYDROBROMIDE 40 MG PO TABS: 40.0000 mg | ORAL_TABLET | Freq: Every day | ORAL | 0 refills | Status: DC

## 2018-03-17 NOTE — Progress Notes (Signed)
**Note De-Identified [place] [facility]** Subjective:    Patient ID: Haley [name], female    DOB: 1960/12/23, 58 y.o.   MRN: [id number]  Haley [name] is a 57 y.o. female presenting on 03/17/2018 for Anxiety (1 m f/u); Depression; and Allergies   HPI   Remeron 30 mg at bed time, celexa 20 mg one month ago. Citalopram is helping. Still having trouble sleeping. Father died 6 mo ago. Not currently doing therapy. Currently lorazepam 30 tabs over three months.   Social History   Tobacco Use  . Smoking status: Current Every Day Smoker    Packs/day: 1.00    Years: 35.00    Pack years: 35.00    Types: Cigarettes    Last attempt to quit: 08/03/2013    Years since quitting: 4.6  . Smokeless tobacco: Never Used  Substance Use Topics  . Alcohol use: No    Frequency: Never    Comment: occasional  . Drug use: No    Review of Systems Per HPI unless specifically indicated above     Objective:    BP 117/76   Pulse 97   Temp 99.2 F (37.3 C) (Oral)   Ht 5' (1.524 m)   Wt 97 lb 3.2 oz (44.1 kg)   SpO2 95%   BMI 18.98 kg/m   Wt Readings from Last 3 Encounters:  03/17/18 97 lb 3.2 oz (44.1 kg)  02/22/18 97 lb (44 kg)  02/14/18 92 lb 6 oz (41.9 kg)    Physical Exam  Constitutional: She is oriented to person, place, and time. She appears well-developed and well-nourished.  Cardiovascular: Normal rate and regular rhythm.  Pulmonary/Chest: Effort normal and breath sounds normal.  Neurological: She is alert and oriented to person, place, and time.  Skin: Skin is warm and dry.  Psychiatric: She exhibits a depressed mood.   Results for orders placed or performed in visit on 11/03/17  CBC with Differential/Platelet  Result Value Ref Range   WBC 6.7 3.4 - 10.8 x10E3/uL   RBC 4.15 3.77 - 5.28 x10E6/uL   Hemoglobin 12.9 11.1 - 15.9 g/dL   Hematocrit 38.3 34.0 - 46.6 %   MCV 92 79 - 97 fL   MCH 31.1 26.6 - 33.0 pg   MCHC 33.7 31.5 - 35.7 g/dL   RDW 14.9 12.3 - 15.4 %   Platelets 471 (H) 150 - 379 x10E3/uL   Neutrophils 63  Not Estab. %   Lymphs 26 Not Estab. %   Monocytes 7 Not Estab. %   Eos 3 Not Estab. %   Basos 1 Not Estab. %   Neutrophils Absolute 4.2 1.4 - 7.0 x10E3/uL   Lymphocytes Absolute 1.7 0.7 - 3.1 x10E3/uL   Monocytes Absolute 0.5 0.1 - 0.9 x10E3/uL   EOS (ABSOLUTE) 0.2 0.0 - 0.4 x10E3/uL   Basophils Absolute 0.1 0.0 - 0.2 x10E3/uL   Immature Granulocytes 0 Not Estab. %   Immature Grans (Abs) 0.0 0.0 - 0.1 x10E3/uL  Comprehensive metabolic panel  Result Value Ref Range   Glucose 83 65 - 99 mg/dL   BUN 9 6 - 24 mg/dL   Creatinine, Ser 0.67 0.57 - 1.00 mg/dL   GFR calc non Af Amer 97 >59 mL/min/1.73   GFR calc Af Amer 112 >59 mL/min/1.73   BUN/Creatinine Ratio 13 9 - 23   Sodium 140 134 - 144 mmol/L   Potassium 4.9 3.5 - 5.2 mmol/L   Chloride 103 96 - 106 mmol/L   CO2 23 20 - 29 mmol/L **Note De-Identified [place] [facility]uscation** Calcium 9.6 8.7 - 10.2 mg/dL   Total Protein 7.1 6.0 - 8.5 g/dL   Albumin 4.3 3.5 - 5.5 g/dL   Globulin, Total 2.8 1.5 - 4.5 g/dL   Albumin/Globulin Ratio 1.5 1.2 - 2.2   Bilirubin Total 0.3 0.0 - 1.2 mg/dL   Alkaline Phosphatase 94 39 - 117 IU/L   AST 16 0 - 40 IU/L   ALT 11 0 - 32 IU/L      Assessment & Plan:  1. Insomnia, unspecified type  Increased Celexa. Provided information for [facility] for free counseling services.  2. Essential hypertension  Normotensive today. She is on 12.5 mg metoprolol but hasn't taken this in 2 days. Trial off this medication as she is normotensive today and had hypotension during her colonoscopy.   3. Anxiety  - citalopram (CELEXA) 40 MG tablet; Take 1 tablet (40 mg total) by mouth daily.  Dispense: 90 tablet; Refill: 0    Follow up plan: Return in about 2 months (around 05/17/2018) for anxiety and bp and cpe.  Carles Collet, PA-C [facility] 03/17/2018, 4:20 PM

## 2018-03-17 NOTE — Patient Instructions (Signed)
**Note De-Identified [place] [facility]** General Information  Phone: [phone] Toll-free: [phone] Email: [email or web address]

## 2018-06-01 ENCOUNTER — Encounter: Payer: 59 | Admitting: Nurse Practitioner

## 2018-06-01 ENCOUNTER — Encounter: Payer: 59 | Admitting: Unknown Physician Specialty

## 2018-06-22 ENCOUNTER — Other Ambulatory Visit: Payer: Self-pay | Admitting: Physician Assistant

## 2018-06-22 DIAGNOSIS — F419 Anxiety disorder, unspecified: Secondary | ICD-10-CM

## 2018-06-22 NOTE — Telephone Encounter (Signed)
**Note De-Identified [place] [facility]** I last saw patient in 03/2018 for anxiety, insomnia, and HTN.

## 2018-06-22 NOTE — Telephone Encounter (Signed)
**Note De-Identified [place] [facility]** Refill request approved. Will need appointment for next refill.

## 2018-07-12 ENCOUNTER — Ambulatory Visit: Payer: 59 | Admitting: Nurse Practitioner

## 2018-07-13 ENCOUNTER — Encounter: Payer: Self-pay | Admitting: Nurse Practitioner

## 2018-07-13 ENCOUNTER — Ambulatory Visit (INDEPENDENT_AMBULATORY_CARE_PROVIDER_SITE_OTHER): Payer: 59 | Admitting: Nurse Practitioner

## 2018-07-13 ENCOUNTER — Other Ambulatory Visit: Payer: Self-pay

## 2018-07-13 ENCOUNTER — Ambulatory Visit: Payer: 59 | Admitting: Family Medicine

## 2018-07-13 VITALS — BP 136/81 | HR 94 | Temp 98.9°F | Ht 59.0 in | Wt 103.0 lb

## 2018-07-13 DIAGNOSIS — J0101 Acute recurrent maxillary sinusitis: Secondary | ICD-10-CM

## 2018-07-13 DIAGNOSIS — J449 Chronic obstructive pulmonary disease, unspecified: Secondary | ICD-10-CM | POA: Diagnosis not present

## 2018-07-13 MED ORDER — FLUTICASONE PROPIONATE 50 MCG/ACT NA SUSP: 2.0000 | Freq: Every day | NASAL | 6 refills | Status: DC

## 2018-07-13 MED ORDER — AMOXICILLIN-POT CLAVULANATE 875-125 MG PO TABS: 1.0000 | ORAL_TABLET | Freq: Two times a day (BID) | ORAL | 0 refills | Status: DC

## 2018-07-13 MED ORDER — MONTELUKAST SODIUM 10 MG PO TABS: 10.0000 mg | ORAL_TABLET | Freq: Every day | ORAL | 5 refills | Status: DC

## 2018-07-13 MED ORDER — ALBUTEROL SULFATE HFA 108 (90 BASE) MCG/ACT IN AERS
2.0000 | INHALATION_SPRAY | Freq: Four times a day (QID) | RESPIRATORY_TRACT | 5 refills | Status: DC | PRN
Start: 2018-07-13 — End: 2020-01-08

## 2018-07-13 NOTE — Progress Notes (Signed)
**Note De-Identified [place] [facility]** BP 136/81   Pulse 94   Temp 98.9 F (37.2 C) (Oral)   Ht 4\' 11"  (1.499 m)   Wt 103 lb (46.7 kg)   SpO2 96%   BMI 20.80 kg/m    Subjective:    Patient ID: Haley [name], female    DOB: 31-Aug-1960, 57 y.o.   MRN: [id number]  HPI: Haley [name] is a 57 y.o. female presents for URI symptoms  Chief Complaint  Patient presents with  . Cough    x about 4 weeks on and off  . Headache  . Ear Pain  . Sore Throat  . Fever    over the weekend   UPPER RESPIRATORY TRACT INFECTION Had a head/chest cold about 4 weeks ago that fluctuated with severity.  She reports symptoms have now become worse.  Now reports green nasal drainage.  States she gets sinus infections often during this time of year or spring time.  Has not been using Flonase, reports it has been awhile since she has used.  Last sinusitis was in April last year, treated Augmentin.  Is presently a smoker, 1/2 PPD.   Not interested in quitting at this time.  Has underlying COPD, reports no inhalers present at home but does have occasional episodes of SOB/wheezing throughout year (none recently). Worst symptom: nasal drainage and sinus pressure Fever: yes, over the weekend low grade Cough: yes, worse when she is lying down Shortness of breath: no Wheezing: no Chest pain: no Chest tightness: no Chest congestion: no Nasal congestion: yes Runny nose: yes Post nasal drip: yes Sneezing: yes Sore throat: only in morning Swollen glands: no Sinus pressure: yes Headache: yes Face pain: yes, under eyes Toothache: no Ear pain: yes left Ear pressure: yes bilateral Eyes red/itching:no Eye drainage/crusting: no  Vomiting: no Rash: no Fatigue: yes Sick contacts: no Strep contacts: no  Context: fluctuating Recurrent sinusitis: yes, this time of year and spring Relief with OTC cold/cough medications: no  Treatments attempted: cold/sinus , Mucinex  Relevant past medical, surgical, family and social history reviewed and updated as  indicated. Interim medical history since our last visit reviewed. Allergies and medications reviewed and updated.  Review of Systems  Constitutional: Positive for fatigue and fever. Negative for activity change, appetite change and diaphoresis.  HENT: Positive for congestion, ear pain, postnasal drip, rhinorrhea, sinus pressure, sinus pain, sneezing and sore throat. Negative for ear discharge, facial swelling, tinnitus and voice change.   Eyes: Negative for pain, itching and visual disturbance.  Respiratory: Negative for cough, chest tightness, shortness of breath and wheezing.   Cardiovascular: Negative for chest pain, palpitations and leg swelling.  Gastrointestinal: Negative for abdominal distention, abdominal pain, constipation, diarrhea, nausea and vomiting.  Neurological: Negative for dizziness, syncope, weakness, light-headedness, numbness and headaches.  Psychiatric/Behavioral: Negative.     Per HPI unless specifically indicated above     Objective:    BP 136/81   Pulse 94   Temp 98.9 F (37.2 C) (Oral)   Ht 4\' 11"  (1.499 m)   Wt 103 lb (46.7 kg)   SpO2 96%   BMI 20.80 kg/m   Wt Readings from Last 3 Encounters:  07/13/18 103 lb (46.7 kg)  03/17/18 97 lb 3.2 oz (44.1 kg)  02/22/18 97 lb (44 kg)    Physical Exam  Constitutional: She is oriented to person, place, and time. She appears well-developed and well-nourished.  HENT:  Head: Normocephalic. Head is without raccoon's eyes.  Right Ear: Hearing, external ear and **Note De-Identified [place] [facility]uscation** ear canal normal. A middle ear effusion is present.  Left Ear: Hearing, external ear and ear canal normal. A middle ear effusion is present.  Nose: Mucosal edema and rhinorrhea present. Right sinus exhibits maxillary sinus tenderness. Right sinus exhibits no frontal sinus tenderness. Left sinus exhibits maxillary sinus tenderness. Left sinus exhibits no frontal sinus tenderness.  Mouth/Throat: Uvula is midline, oropharynx is clear and moist and mucous  membranes are normal. No oropharyngeal exudate, posterior oropharyngeal edema or posterior oropharyngeal erythema.  Eyes: Pupils are equal, round, and reactive to light. Conjunctivae and EOM are normal. Right eye exhibits no discharge. Left eye exhibits no discharge.  Neck: Normal range of motion. Neck supple. No JVD present. Carotid bruit is not present. No thyromegaly present.  Cardiovascular: Normal rate, regular rhythm and normal heart sounds.  Pulmonary/Chest: Effort normal and breath sounds normal.  Abdominal: Soft. Bowel sounds are normal.  Lymphadenopathy:       Head (right side): No submental, no submandibular and no tonsillar adenopathy present.       Head (left side): No submental, no submandibular and no tonsillar adenopathy present.    She has no cervical adenopathy.  Neurological: She is alert and oriented to person, place, and time.  Skin: Skin is warm and dry.  Psychiatric: She has a normal mood and affect. Her behavior is normal. Judgment and thought content normal.  Nursing note and vitals reviewed.   Results for orders placed or performed in visit on 11/03/17  CBC with Differential/Platelet  Result Value Ref Range   WBC 6.7 3.4 - 10.8 x10E3/uL   RBC 4.15 3.77 - 5.28 x10E6/uL   Hemoglobin 12.9 11.1 - 15.9 g/dL   Hematocrit 38.3 34.0 - 46.6 %   MCV 92 79 - 97 fL   MCH 31.1 26.6 - 33.0 pg   MCHC 33.7 31.5 - 35.7 g/dL   RDW 14.9 12.3 - 15.4 %   Platelets 471 (H) 150 - 379 x10E3/uL   Neutrophils 63 Not Estab. %   Lymphs 26 Not Estab. %   Monocytes 7 Not Estab. %   Eos 3 Not Estab. %   Basos 1 Not Estab. %   Neutrophils Absolute 4.2 1.4 - 7.0 x10E3/uL   Lymphocytes Absolute 1.7 0.7 - 3.1 x10E3/uL   Monocytes Absolute 0.5 0.1 - 0.9 x10E3/uL   EOS (ABSOLUTE) 0.2 0.0 - 0.4 x10E3/uL   Basophils Absolute 0.1 0.0 - 0.2 x10E3/uL   Immature Granulocytes 0 Not Estab. %   Immature Grans (Abs) 0.0 0.0 - 0.1 x10E3/uL  Comprehensive metabolic panel  Result Value Ref Range    Glucose 83 65 - 99 mg/dL   BUN 9 6 - 24 mg/dL   Creatinine, Ser 0.67 0.57 - 1.00 mg/dL   GFR calc non Af Amer 97 >59 mL/min/1.73   GFR calc Af Amer 112 >59 mL/min/1.73   BUN/Creatinine Ratio 13 9 - 23   Sodium 140 134 - 144 mmol/L   Potassium 4.9 3.5 - 5.2 mmol/L   Chloride 103 96 - 106 mmol/L   CO2 23 20 - 29 mmol/L   Calcium 9.6 8.7 - 10.2 mg/dL   Total Protein 7.1 6.0 - 8.5 g/dL   Albumin 4.3 3.5 - 5.5 g/dL   Globulin, Total 2.8 1.5 - 4.5 g/dL   Albumin/Globulin Ratio 1.5 1.2 - 2.2   Bilirubin Total 0.3 0.0 - 1.2 mg/dL   Alkaline Phosphatase 94 39 - 117 IU/L   AST 16 0 - 40 IU/L   ALT 11 **Note De-Identified [place] [facility]bfuscation** 0 - 32 IU/L      Assessment & Plan:   Problem List Items Addressed This Visit      Respiratory   Moderate COPD (chronic obstructive pulmonary disease) (HCC)    No current inhalers, but report intermittent SOB/wheezing t/o year.  Albuterol inhaler PRN script sent.  Montelukast script sent, as patient appears to have seasonal symptoms d/t allergies.  Adjust inhaler regimen as needed.      Relevant Medications   montelukast (SINGULAIR) 10 MG tablet   albuterol (PROVENTIL HFA;VENTOLIN HFA) 108 (90 Base) MCG/ACT inhaler   fluticasone (FLONASE) 50 MCG/ACT nasal spray   Sinusitis, acute, maxillary - Primary    Augmentin script sent to pharmacy.  Recommended use of Claritin 10 MG daily during acute phase and use of Flonase daily.  Sinus rinses and humidifier use at home.  Take daily probiotic or probiotic yogurt while on abx treatment.  Return for worsening or continued symptoms.      Relevant Medications   amoxicillin-clavulanate (AUGMENTIN) 875-125 MG tablet   fluticasone (FLONASE) 50 MCG/ACT nasal spray       Follow up plan: Return if symptoms worsen or fail to improve.

## 2018-07-13 NOTE — Assessment & Plan Note (Signed)
**Note De-Identified [place] [facility]uscation** No current inhalers, but report intermittent SOB/wheezing t/o year.  Albuterol inhaler PRN script sent.  Montelukast script sent, as patient appears to have seasonal symptoms d/t allergies.  Adjust inhaler regimen as needed.

## 2018-07-13 NOTE — Assessment & Plan Note (Signed)
**Note De-Identified [place] [facility]bfuscation** Augmentin script sent to pharmacy.  Recommended use of Claritin 10 MG daily during acute phase and use of Flonase daily.  Sinus rinses and humidifier use at home.  Take daily probiotic or probiotic yogurt while on abx treatment.  Return for worsening or continued symptoms.

## 2018-07-13 NOTE — Patient Instructions (Signed)
**Note De-identified [place] [facility]** Sinusitis, Adult Sinusitis is soreness and inflammation of your sinuses. Sinuses are hollow spaces in the bones around your face. They are located:  Around your eyes.  In the middle of your forehead.  Behind your nose.  In your cheekbones.  Your sinuses and nasal passages are lined with a stringy fluid (mucus). Mucus normally drains out of your sinuses. When your nasal tissues get inflamed or swollen, the mucus can get trapped or blocked so air cannot flow through your sinuses. This lets bacteria, viruses, and funguses grow, and that leads to infection. Follow these instructions at home: Medicines  Take, use, or apply over-the-counter and prescription medicines only as told by your doctor. These may include nasal sprays.  If you were prescribed an antibiotic medicine, take it as told by your doctor. Do not stop taking the antibiotic even if you start to feel better. Hydrate and Humidify  Drink enough water to keep your pee (urine) clear or pale yellow.  Use a cool mist humidifier to keep the humidity level in your home above 50%.  Breathe in steam for 10-15 minutes, 3-4 times a day or as told by your doctor. You can do this in the bathroom while a hot shower is running.  Try not to spend time in cool or dry air. Rest  Rest as much as possible.  Sleep with your head raised (elevated).  Make sure to get enough sleep each night. General instructions  Put a warm, moist washcloth on your face 3-4 times a day or as told by your doctor. This will help with discomfort.  Wash your hands often with soap and water. If there is no soap and water, use hand sanitizer.  Do not smoke. Avoid being around people who are smoking (secondhand smoke).  Keep all follow-up visits as told by your doctor. This is important. Contact a doctor if:  You have a fever.  Your symptoms get worse.  Your symptoms do not get better within 10 days. Get help right away if:  You have a very bad  headache.  You cannot stop throwing up (vomiting).  You have pain or swelling around your face or eyes.  You have trouble seeing.  You feel confused.  Your neck is stiff.  You have trouble breathing. This information is not intended to replace advice given to you by your health care provider. Make sure you discuss any questions you have with your health care provider. Document Released: 01/06/2008 Document Revised: 03/15/2016 Document Reviewed: 05/15/2015 Elsevier Interactive Patient Education  2018 [employer]  

## 2018-08-10 ENCOUNTER — Emergency Department: Payer: 59

## 2018-08-10 ENCOUNTER — Encounter: Payer: Self-pay | Admitting: Emergency Medicine

## 2018-08-10 ENCOUNTER — Emergency Department
Admission: EM | Admit: 2018-08-10 | Discharge: 2018-08-10 | Disposition: A | Discharge status: Home / Self Care | Payer: 59 | Attending: Emergency Medicine | Admitting: Emergency Medicine

## 2018-08-10 DIAGNOSIS — Z79899 Other long term (current) drug therapy: Secondary | ICD-10-CM | POA: Diagnosis not present

## 2018-08-10 DIAGNOSIS — I110 Hypertensive heart disease with heart failure: Secondary | ICD-10-CM | POA: Insufficient documentation

## 2018-08-10 DIAGNOSIS — R0789 Other chest pain: Secondary | ICD-10-CM | POA: Diagnosis present

## 2018-08-10 DIAGNOSIS — I509 Heart failure, unspecified: Secondary | ICD-10-CM | POA: Diagnosis not present

## 2018-08-10 DIAGNOSIS — J449 Chronic obstructive pulmonary disease, unspecified: Secondary | ICD-10-CM | POA: Insufficient documentation

## 2018-08-10 DIAGNOSIS — F17210 Nicotine dependence, cigarettes, uncomplicated: Secondary | ICD-10-CM | POA: Insufficient documentation

## 2018-08-10 LAB — BASIC METABOLIC PANEL
Anion gap: 12 (ref 5–15)
BUN: 12 mg/dL (ref 6–20)
CALCIUM: 9.1 mg/dL (ref 8.9–10.3)
CO2: 21 mmol/L — ABNORMAL LOW (ref 22–32)
CREATININE: 0.69 mg/dL (ref 0.44–1.00)
Chloride: 106 mmol/L (ref 98–111)
GFR calc Af Amer: >60 mL/min (ref >60)
GFR calc non Af Amer: >60 mL/min (ref >60)
Glucose, Bld: 124 mg/dL — ABNORMAL HIGH (ref 70–99)
Potassium: 3.4 mmol/L — ABNORMAL LOW (ref 3.5–5.1)
Sodium: 139 mmol/L (ref 135–145)

## 2018-08-10 LAB — CBC
HCT: 42.4 % (ref 36.0–46.0)
Hemoglobin: 13.9 g/dL (ref 12.0–15.0)
MCH: 31.5 pg (ref 26.0–34.0)
MCHC: 32.8 g/dL (ref 30.0–36.0)
MCV: 96.1 fL (ref 80.0–100.0)
NRBC: 0 % (ref 0.0–0.2)
PLATELETS: 388 10*3/uL (ref 150–400)
RBC: 4.41 MIL/uL (ref 3.87–5.11)
RDW: 14 % (ref 11.5–15.5)
WBC: 10.3 10*3/uL (ref 4.0–10.5)

## 2018-08-10 LAB — TROPONIN I: Troponin I: <0.03 ng/mL (ref <0.03)

## 2018-08-10 MED ORDER — CYCLOBENZAPRINE HCL 10 MG PO TABS: 10.0000 mg | ORAL_TABLET | Freq: Three times a day (TID) | ORAL | 0 refills | Status: AC | PRN

## 2018-08-10 MED ORDER — CYCLOBENZAPRINE HCL 10 MG PO TABS
10.0000 mg | ORAL_TABLET | Freq: Once | ORAL | Status: AC
  Administered 2018-08-10: 10 mg via ORAL
  Filled 2018-08-10: qty 1

## 2018-08-10 MED ORDER — NAPROXEN 500 MG PO TABS: 500.0000 mg | ORAL_TABLET | Freq: Two times a day (BID) | ORAL | 0 refills | Status: AC

## 2018-08-10 MED ORDER — IBUPROFEN 600 MG PO TABS
600.0000 mg | ORAL_TABLET | Freq: Once | ORAL | Status: AC
  Administered 2018-08-10: 600 mg via ORAL
  Filled 2018-08-10: qty 1

## 2018-08-10 NOTE — ED Provider Notes (Signed)
**Note De-Identified Aymond Obfuscation** [facility] Emergency Department Provider Note ____________________________________________   First MD Initiated Contact with Patient 08/10/18 2206     (approximate)  I have reviewed the triage vital signs and the nursing notes.   HISTORY  Chief Complaint Chest Pain and Shortness of Breath    HPI Haley [name] is a 58 y.o. female with PMH as noted below who presents with right lateral chest pain radiating towards the right side of her back, occurring intermittently over the last week, and worse with certain positions as well as with taking a deep breath.  The patient states that she has had persistent cough and congestion for about the last 2 months and has been on a few courses of medications.  She states that the symptoms are not any worse than they have been.  She denies any acute shortness of breath.  The patient also denies any nausea or vomiting and states that the pain she is here for today is not related to eating.  The patient is concerned that she had "double pneumonia" in the past and whether the pain could be due to this now.  Past Medical History:  Diagnosis Date  . Abdominal pain 04/29/2015  . Acute diverticulitis of intestine 03/18/2017  . Acute pancreatitis 01/13/2017  . Acute sinusitis 09/01/2015  . Anemia   . Anxiety   . Benign neoplasm of sigmoid colon   . Breast cancer screening 07/08/2015  . CHF (congestive heart failure) ([place])    while hospitalized with pneumonia  . Chronic pain syndrome   . COPD (chronic obstructive pulmonary disease) ([place])    while hospitalized  . Cyclic vomiting syndrome   . Cyclical vomiting   . Diverticulitis of large intestine with abscess without bleeding   . Dysphagia   . GERD (gastroesophageal reflux disease)    controlled  . Hematuria, microscopic 04/26/2015   Urine Sept 2016; rechecked still present; refer to urologist; hx of "polyps" in bladder 25 years ago (1991-ish)   . History of pneumonia   . History  of sepsis   . Hypertension    controlled on meds  . Hypokalemia 04/24/2015  . IBS (irritable bowel syndrome) 02/02/2017  . Leukocytosis   . Low serum potassium 01/13/2017  . Marital stress 07/12/2015   Separated from husband Fall 2016   . Moderate COPD (chronic obstructive pulmonary disease) ([place])   . Osteopenia Nov 2015  . Pain due to dental caries 05/09/2015  . Pneumonia 4 years ago  . Recurrent depressive disorder, current episode moderate ([place]) 04/24/2015  . Severe depression ([place]) 05/12/2016  . Special screening for malignant neoplasms, colon   . Tachycardia   . Thrombocytosis ([place])   . Vitamin D deficiency disease     Patient Active Problem List   Diagnosis Date Noted  . Depression, major, single episode, moderate ([place]) 02/14/2018  . Allergic rhinitis 02/14/2018  . Anemia 11/03/2017  . Diverticulitis of colon 09/15/2017  . Diverticulitis of large intestine with abscess without bleeding   . IBS (irritable bowel syndrome) 02/02/2017  . Anxiety 02/02/2017  . Special screening for malignant neoplasms, colon   . Benign neoplasm of sigmoid colon   . GERD (gastroesophageal reflux disease) 01/04/2016  . Sinusitis, acute, maxillary 09/01/2015  . Marital stress 07/12/2015  . Pain due to dental caries 05/09/2015  . Hematuria, microscopic 04/26/2015  . Vitamin D deficiency disease   . Moderate COPD (chronic obstructive pulmonary disease) ([place])   . Osteopenia 06/03/2014    Past Surgical **Note De-Identified [place]pson Obfuscation** History:  Procedure Laterality Date  . CESAREAN SECTION  09/27/79, 08/29/83   x 2  . COLON RESECTION N/A 10/08/2017   Procedure: LAPAROSCOPIC SIGMOID COLON RESECTION Converted to Open Sigmoid Colectomy;  Surgeon: [name], MD;  Location: [facility] ORS;  Service: General;  Laterality: N/A;  . COLONOSCOPY WITH PROPOFOL N/A 01/09/2016   Procedure: COLONOSCOPY WITH PROPOFOL;  Surgeon: Lucilla Lame, MD;  Location: [place];  Service: Endoscopy;  Laterality: N/A;  . POLYPECTOMY  01/09/2016    Procedure: POLYPECTOMY;  Surgeon: Lucilla Lame, MD;  Location: [place];  Service: Endoscopy;;    Prior to Admission medications   Medication Sig Start Date End Date Taking? Authorizing Provider  albuterol (PROVENTIL HFA;VENTOLIN HFA) 108 (90 Base) MCG/ACT inhaler Inhale 2 puffs into the lungs every 6 (six) hours as needed for wheezing or shortness of breath. 07/13/18   Cannady, Henrine Screws T, NP  amoxicillin-clavulanate (AUGMENTIN) 875-125 MG tablet Take 1 tablet by mouth 2 (two) times daily. 07/13/18   Cannady, Henrine Screws T, NP  citalopram (CELEXA) 40 MG tablet TAKE 1 TABLET BY MOUTH ONCE DAILY 06/22/18   Cannady, Henrine Screws T, NP  cyclobenzaprine (FLEXERIL) 10 MG tablet Take 1 tablet (10 mg total) by mouth 3 (three) times daily as needed for up to 5 days for muscle spasms. 08/10/18 08/15/18  Arta Silence, MD  fluticasone (FLONASE) 50 MCG/ACT nasal spray Place 2 sprays into both nostrils daily. 07/13/18   Cannady, Henrine Screws T, NP  mirtazapine (REMERON) 30 MG tablet Take 1 tablet (30 mg total) by mouth at bedtime. 02/14/18   Kathrine Haddock, NP  montelukast (SINGULAIR) 10 MG tablet Take 1 tablet (10 mg total) by mouth at bedtime. 07/13/18   Cannady, Henrine Screws T, NP  naproxen (NAPROSYN) 500 MG tablet Take 1 tablet (500 mg total) by mouth 2 (two) times daily with a meal for 15 days. 08/10/18 08/25/18  Arta Silence, MD  omeprazole (PRILOSEC) 20 MG capsule Take 20 mg by mouth daily.    [provider]    Allergies Doxycycline and Sulfur  Family History  Problem Relation Age of Onset  . Cancer Father        prostate  . Heart disease Father   . Heart attack Father   . Hypertension Father   . Stroke Neg Hx   . COPD Neg Hx     Social History Social History   Tobacco Use  . Smoking status: Current Every Day Smoker    Packs/day: 1.00    Years: 35.00    Pack years: 35.00    Types: Cigarettes    Last attempt to quit: 08/03/2013    Years since quitting: 5.0  . Smokeless tobacco:  Never Used  Substance Use Topics  . Alcohol use: No    Frequency: Never    Comment: occasional  . Drug use: No    Review of Systems  Constitutional: No fever. Eyes: No redness. ENT: No sore throat. Cardiovascular: Positive for chest wall pain. Respiratory: Denies shortness of breath. Gastrointestinal: No nausea, no vomiting. Genitourinary: Negative for dysuria.  Musculoskeletal: Negative for back pain. Skin: Negative for rash. Neurological: Negative for headache.   ____________________________________________   PHYSICAL EXAM:  VITAL SIGNS: ED Triage Vitals  Enc Vitals Group     BP 08/10/18 1719 (!) 148/78     Pulse Rate 08/10/18 1719 94     Resp 08/10/18 1719 18     Temp 08/10/18 1719 98.9 F (37.2 C)     Temp Source 08/10/18 **Note De-Identified [place] Obfuscation** 1719 Oral     SpO2 08/10/18 1719 97 %     Weight 08/10/18 1720 103 lb (46.7 kg)     Height 08/10/18 1720 4\' 11"  (1.499 m)     Head Circumference --      Peak Flow --      Pain Score 08/10/18 1720 8     Pain Loc --      Pain Edu? --      Excl. in [place]? --     Constitutional: Alert and oriented.  Relatively well appearing and in no acute distress. Eyes: Conjunctivae are normal.  Head: Atraumatic. Nose: No congestion/rhinnorhea. Mouth/Throat: Mucous membranes are moist.   Neck: Normal range of motion.  Cardiovascular: Normal rate, regular rhythm. Grossly normal heart sounds.  Good peripheral circulation.  Right lateral chest wall tenderness reproducing the pain.  No step-offs or crepitus. Respiratory: Normal respiratory effort.  No retractions. Lungs CTAB. Gastrointestinal: Soft and nontender. No distention.  Genitourinary: No CVA tenderness. Musculoskeletal: No lower extremity edema.  Extremities warm and well perfused.  Neurologic:  Normal speech and language. No gross focal neurologic deficits are appreciated.  Skin:  Skin is warm and dry. No rash noted. Psychiatric: Mood and affect are normal. Speech and behavior are  normal.  ____________________________________________   LABS (all labs ordered are listed, but only abnormal results are displayed)  Labs Reviewed  BASIC METABOLIC PANEL - Abnormal; Notable for the following components:      Result Value   Potassium 3.4 (*)    CO2 21 (*)    Glucose, Bld 124 (*)    All other components within normal limits  CBC  TROPONIN I   ____________________________________________  EKG  ED ECG REPORT I, Arta Silence, the attending physician, personally viewed and interpreted this ECG.  Date: 08/10/2018 EKG Time: 1716 Rate: 91 Rhythm: normal sinus rhythm QRS Axis: normal Intervals: normal ST/T Wave abnormalities: normal Narrative Interpretation: no evidence of acute ischemia  ____________________________________________  RADIOLOGY  CXR: No focal infiltrate or other acute abnormality  ____________________________________________   PROCEDURES  Procedure(s) performed: No  Procedures  Critical Care performed: No ____________________________________________   INITIAL IMPRESSION / ASSESSMENT AND PLAN / ED COURSE  Pertinent labs & imaging results that were available during my care of the patient were reviewed by me and considered in my medical decision making (see chart for details).  58 year old female presents with right lateral chest wall pain radiating to the right mid back occurring after she has had persistent cough and congestion over the last few months.  The pain is not related to any GI symptoms.  It is positional and somewhat pleuritic.  It has been intermittent over these last few weeks.  On exam she is well-appearing and her vital signs are normal except for slight hypertension.  She has no hypoxia.  She does have reproducible right lateral chest wall tenderness corresponding to the pain.  The abdomen is nontender in the right upper quadrant.  Her EKG is nonischemic.  Lab work-up obtained from triage is within normal limits  except for slightly low bicarbonate.  Troponin is negative.  Overall the pain is consistent with chest wall pain related to her persistent cough.  It is reproducible on exam.  It is extremely atypical for ACS; given the duration of the symptoms and the normal EKG, there is no indication for repeat troponin.  The patient also has no findings consistent with DVT or PE.  She has no tachycardia or hypoxia and no leg **Note De-Identified Nam Obfuscation** swelling.  PE also would not be consistent with intermittent pain.  I also considered the gallbladder as a source of the pain, however this is also not consistent with the patient's symptoms.  She is not tender in the right upper quadrant of the abdomen and the pain is not associated with eating.  She has no fever or elevated WBC count.  I discussed the possibility of getting a d-dimer spite our low suspicion for PE, however the patient is mainly relieved to know that she does not have pneumonia.  She also does not think that there could be a blood clot, and she would like to go home.  I will prescribe Naprosyn and Flexeril for chest wall pain.  I counseled her on the results of the work-up.  Return precautions were given, and the patient expressed understanding.  ____________________________________________   FINAL CLINICAL IMPRESSION(S) / ED DIAGNOSES  Final diagnoses:  Chest wall pain      NEW MEDICATIONS STARTED DURING THIS VISIT:  Discharge Medication List as of 08/10/2018 10:38 PM    START taking these medications   Details  cyclobenzaprine (FLEXERIL) 10 MG tablet Take 1 tablet (10 mg total) by mouth 3 (three) times daily as needed for up to 5 days for muscle spasms., Starting Wed 08/10/2018, Until Mon 08/15/2018, Normal    naproxen (NAPROSYN) 500 MG tablet Take 1 tablet (500 mg total) by mouth 2 (two) times daily with a meal for 15 days., Starting Wed 08/10/2018, Until Thu 08/25/2018, Normal         Note:  This document was prepared using Dragon voice recognition software and may  include unintentional dictation errors.   Arta Silence, MD 08/10/18 2258

## 2018-08-10 NOTE — Discharge Instructions (Addendum)
**Note De-Identified [place]astasia Obfuscation** Return to the ER for new, worsening, or persistent pain, cough, fever, difficulty breathing, or any other new or worsening symptoms that concern you.  Follow-up with your regular doctor and continue your other regular medications as prescribed.  You should take the Naprosyn twice daily over the next several days, and the Flexeril only if needed for spasms especially at night.

## 2018-08-10 NOTE — ED Triage Notes (Addendum)
**Note De-Identified [place] Obfuscation** Patient reports cough/congestion x 2 months that has been worse for the past week.  Today, patient reports she is having increased right sided chest pain radiating into her back with increased shortness of breath.  Patient appears somewhat uncomfortable during triage.  Speaking in full sentences without difficulty.

## 2018-08-26 ENCOUNTER — Ambulatory Visit (INDEPENDENT_AMBULATORY_CARE_PROVIDER_SITE_OTHER): Payer: 59 | Admitting: Family Medicine

## 2018-08-26 ENCOUNTER — Encounter: Payer: Self-pay | Admitting: Family Medicine

## 2018-08-26 VITALS — BP 148/95 | HR 113 | Temp 98.2°F | Wt 107.4 lb

## 2018-08-26 DIAGNOSIS — R0781 Pleurodynia: Secondary | ICD-10-CM | POA: Diagnosis not present

## 2018-08-26 MED ORDER — CYCLOBENZAPRINE HCL 10 MG PO TABS: 10.0000 mg | ORAL_TABLET | Freq: Three times a day (TID) | ORAL | 0 refills | Status: DC | PRN

## 2018-08-26 MED ORDER — PREDNISONE 10 MG PO TABS: ORAL_TABLET | ORAL | 0 refills | Status: DC

## 2018-08-26 NOTE — Progress Notes (Signed)
**Note De-Identified [place] Obfuscation** BP (!) 148/95   Pulse (!) 113   Temp 98.2 F (36.8 C) (Oral)   Wt 107 lb 6.4 oz (48.7 kg)   SpO2 95%   BMI 21.69 kg/m    Subjective:    Patient ID: Haley [name], female    DOB: 06/13/1961, 58 y.o.   MRN: [id number]  HPI: Haley [name] is a 58 y.o. female  Chief Complaint  Patient presents with  . Pain    pt states she has been hurting on her right side for about 2 weeks in the rib area    Here today with 2 weeks of right rib pain. Was seen in the ER for this issue initially with extensive workup. EKG, CXR and labs benign. Pain reproducible to touch. Had some relief on aleve and flexeril but since running out of the flexeril pain has returned even worse. Denies SOB, injury, fevers, cough, palpitations.   Relevant past medical, surgical, family and social history reviewed and updated as indicated. Interim medical history since our last visit reviewed. Allergies and medications reviewed and updated.  Review of Systems  Per HPI unless specifically indicated above     Objective:    BP (!) 148/95   Pulse (!) 113   Temp 98.2 F (36.8 C) (Oral)   Wt 107 lb 6.4 oz (48.7 kg)   SpO2 95%   BMI 21.69 kg/m   Wt Readings from Last 3 Encounters:  08/26/18 107 lb 6.4 oz (48.7 kg)  08/10/18 103 lb (46.7 kg)  07/13/18 103 lb (46.7 kg)    Physical Exam Vitals signs and nursing note reviewed.  Constitutional:      Appearance: Normal appearance. She is not ill-appearing.  HENT:     Head: Atraumatic.  Eyes:     Extraocular Movements: Extraocular movements intact.     Conjunctiva/sclera: Conjunctivae normal.  Neck:     Musculoskeletal: Normal range of motion and neck supple.  Cardiovascular:     Rate and Rhythm: Normal rate and regular rhythm.     Heart sounds: Normal heart sounds.  Pulmonary:     Effort: Pulmonary effort is normal.     Breath sounds: Normal breath sounds. No wheezing or rales.  Abdominal:     General: Bowel sounds are normal.     Palpations: Abdomen is  soft. There is no mass.     Tenderness: There is no abdominal tenderness. There is no guarding.  Musculoskeletal: Normal range of motion.        General: Tenderness (ttp right lower ribs anteriorly) present.  Skin:    General: Skin is warm and dry.  Neurological:     Mental Status: She is alert and oriented to person, place, and time.  Psychiatric:        Mood and Affect: Mood normal.        Thought Content: Thought content normal.        Judgment: Judgment normal.     Results for orders placed or performed during the hospital encounter of 08/04/70  Basic metabolic panel  Result Value Ref Range   Sodium 139 135 - 145 mmol/L   Potassium 3.4 (L) 3.5 - 5.1 mmol/L   Chloride 106 98 - 111 mmol/L   CO2 21 (L) 22 - 32 mmol/L   Glucose, Bld 124 (H) 70 - 99 mg/dL   BUN 12 6 - 20 mg/dL   Creatinine, Ser 0.69 0.44 - 1.00 mg/dL   Calcium 9.1 8.9 - 10.3 mg/dL **Note De-Identified [place]ch Obfuscation** GFR calc non Af Amer >60 >60 mL/min   GFR calc Af Amer >60 >60 mL/min   Anion gap 12 5 - 15  CBC  Result Value Ref Range   WBC 10.3 4.0 - 10.5 K/uL   RBC 4.41 3.87 - 5.11 MIL/uL   Hemoglobin 13.9 12.0 - 15.0 g/dL   HCT 42.4 36.0 - 46.0 %   MCV 96.1 80.0 - 100.0 fL   MCH 31.5 26.0 - 34.0 pg   MCHC 32.8 30.0 - 36.0 g/dL   RDW 14.0 11.5 - 15.5 %   Platelets 388 150 - 400 K/uL   nRBC 0.0 0.0 - 0.2 %  Troponin I - ONCE - STAT  Result Value Ref Range   Troponin I <0.03 <0.03 ng/mL      Assessment & Plan:   Problem List Items Addressed This Visit    None    Visit Diagnoses    Rib pain on right side    -  Primary   Obtain rib film for more detailed view. Restart flexeril, add prednisone and heat, lidocaine patches prn. F/u if not improving   Relevant Orders   DG Ribs Unilateral Right       Follow up plan: Return if symptoms worsen or fail to improve.

## 2018-08-29 ENCOUNTER — Ambulatory Visit
Admission: RE | Admit: 2018-08-29 | Discharge: 2018-08-29 | Disposition: A | Discharge status: Home / Self Care | Payer: 59 | Source: Ambulatory Visit | Attending: Family Medicine | Admitting: Family Medicine

## 2018-08-29 DIAGNOSIS — R0781 Pleurodynia: Secondary | ICD-10-CM | POA: Insufficient documentation

## 2018-08-30 ENCOUNTER — Other Ambulatory Visit: Payer: Self-pay | Admitting: Family Medicine

## 2018-08-30 DIAGNOSIS — R0781 Pleurodynia: Secondary | ICD-10-CM

## 2018-08-31 ENCOUNTER — Telehealth: Payer: Self-pay | Admitting: Nurse Practitioner

## 2018-08-31 NOTE — Telephone Encounter (Signed)
**Note De-identified Carley Obfuscation** Patient notified of x-ray results.

## 2018-08-31 NOTE — Telephone Encounter (Signed)
**Note De-Identified [place] Obfuscation** Looks like Haley [name] saw her, but please inform her all imaging was negative.  No pneumonia and no acute findings on ribs or lung imaging.

## 2018-08-31 NOTE — Telephone Encounter (Signed)
**Note De-Identified [place] Obfuscation** Copied from [place] [phone]. Topic: Quick Communication - See Telephone Encounter >> Aug 31, 2018  4:20 PM Vernona Rieger wrote: CRM for notification. See Telephone encounter for: 08/31/18.  Patient would like her xray results from Monday 1/27

## 2018-10-11 ENCOUNTER — Other Ambulatory Visit: Payer: Self-pay | Admitting: Nurse Practitioner

## 2018-10-11 DIAGNOSIS — F419 Anxiety disorder, unspecified: Secondary | ICD-10-CM

## 2018-12-09 ENCOUNTER — Other Ambulatory Visit: Payer: Self-pay

## 2018-12-09 ENCOUNTER — Ambulatory Visit (INDEPENDENT_AMBULATORY_CARE_PROVIDER_SITE_OTHER): Payer: 59 | Admitting: Nurse Practitioner

## 2018-12-09 ENCOUNTER — Encounter: Payer: Self-pay | Admitting: Nurse Practitioner

## 2018-12-09 DIAGNOSIS — F321 Major depressive disorder, single episode, moderate: Secondary | ICD-10-CM

## 2018-12-09 DIAGNOSIS — F419 Anxiety disorder, unspecified: Secondary | ICD-10-CM

## 2018-12-09 MED ORDER — OMEPRAZOLE 20 MG PO CPDR: 20.0000 mg | DELAYED_RELEASE_CAPSULE | Freq: Every day | ORAL | 2 refills | Status: DC

## 2018-12-09 MED ORDER — HYDROXYZINE PAMOATE 25 MG PO CAPS: 25.0000 mg | ORAL_CAPSULE | Freq: Three times a day (TID) | ORAL | 1 refills | Status: DC | PRN

## 2018-12-09 NOTE — Assessment & Plan Note (Signed)
**Note De-Identified Kon Obfuscation** Chronic, ongoing.  Continue current medication regimen.  Add Hydroxyzine as needed for increased anxiety.

## 2018-12-09 NOTE — Assessment & Plan Note (Signed)
**Note De-Identified Poffenberger Obfuscation** Chronic, ongoing.  Continue current medication regimen.  Add Hydroxyzine as needed for anxiety.

## 2018-12-09 NOTE — Patient Instructions (Signed)
**Note De-identified Zulauf Obfuscation** Generalized Anxiety Disorder, Adult Generalized anxiety disorder (GAD) is a mental health disorder. People with this condition constantly worry about everyday events. Unlike normal anxiety, worry related to GAD is not triggered by a specific event. These worries also do not fade or get better with time. GAD interferes with life functions, including relationships, work, and school. GAD can vary from mild to severe. People with severe GAD can have intense waves of anxiety with physical symptoms (panic attacks). What are the causes? The exact cause of GAD is not known. What increases the risk? This condition is more likely to develop in:  Women.  People who have a family history of anxiety disorders.  People who are very shy.  People who experience very stressful life events, such as the death of a loved one.  People who have a very stressful family environment. What are the signs or symptoms? People with GAD often worry excessively about many things in their lives, such as their health and family. They may also be overly concerned about:  Doing well at work.  Being on time.  Natural disasters.  Friendships. Physical symptoms of GAD include:  Fatigue.  Muscle tension or having muscle twitches.  Trembling or feeling shaky.  Being easily startled.  Feeling like your heart is pounding or racing.  Feeling out of breath or like you cannot take a deep breath.  Having trouble falling asleep or staying asleep.  Sweating.  Nausea, diarrhea, or irritable bowel syndrome (IBS).  Headaches.  Trouble concentrating or remembering facts.  Restlessness.  Irritability. How is this diagnosed? Your health care provider can diagnose GAD based on your symptoms and medical history. You will also have a physical exam. The health care provider will ask specific questions about your symptoms, including how severe they are, when they started, and if they come and go. Your health care  provider may ask you about your use of alcohol or drugs, including prescription medicines. Your health care provider may refer you to a mental health specialist for further evaluation. Your health care provider will do a thorough examination and may perform additional tests to rule out other possible causes of your symptoms. To be diagnosed with GAD, a person must have anxiety that:  Is out of his or her control.  Affects several different aspects of his or her life, such as work and relationships.  Causes distress that makes him or her unable to take part in normal activities.  Includes at least three physical symptoms of GAD, such as restlessness, fatigue, trouble concentrating, irritability, muscle tension, or sleep problems. Before your health care provider can confirm a diagnosis of GAD, these symptoms must be present more days than they are not, and they must last for six months or longer. How is this treated? The following therapies are usually used to treat GAD:  Medicine. Antidepressant medicine is usually prescribed for long-term daily control. Antianxiety medicines may be added in severe cases, especially when panic attacks occur.  Talk therapy (psychotherapy). Certain types of talk therapy can be helpful in treating GAD by providing support, education, and guidance. Options include: ? Cognitive behavioral therapy (CBT). People learn coping skills and techniques to ease their anxiety. They learn to identify unrealistic or negative thoughts and behaviors and to replace them with positive ones. ? Acceptance and commitment therapy (ACT). This treatment teaches people how to be mindful as a way to cope with unwanted thoughts and feelings. ? Biofeedback. This process trains you to manage your body's response ( **Note De-identified Saez Obfuscation** physiological response) through breathing techniques and relaxation methods. You will work with a therapist while machines are used to monitor your physical symptoms.  Stress  management techniques. These include yoga, meditation, and exercise. A mental health specialist can help determine which treatment is best for you. Some people see improvement with one type of therapy. However, other people require a combination of therapies. Follow these instructions at home:  Take over-the-counter and prescription medicines only as told by your health care provider.  Try to maintain a normal routine.  Try to anticipate stressful situations and allow extra time to manage them.  Practice any stress management or self-calming techniques as taught by your health care provider.  Do not punish yourself for setbacks or for not making progress.  Try to recognize your accomplishments, even if they are small.  Keep all follow-up visits as told by your health care provider. This is important. Contact a health care provider if:  Your symptoms do not get better.  Your symptoms get worse.  You have signs of depression, such as: ? A persistently sad, cranky, or irritable mood. ? Loss of enjoyment in activities that used to bring you joy. ? Change in weight or eating. ? Changes in sleeping habits. ? Avoiding friends or family members. ? Loss of energy for normal tasks. ? Feelings of guilt or worthlessness. Get help right away if:  You have serious thoughts about hurting yourself or others. If you ever feel like you may hurt yourself or others, or have thoughts about taking your own life, get help right away. You can go to your nearest emergency department or call:  Your local emergency services (911 in the [place]  A suicide crisis helpline, such as the National Suicide Prevention Lifeline at 1-800-273-8255. This is open 24 hours a day. Summary  Generalized anxiety disorder (GAD) is a mental health disorder that involves worry that is not triggered by a specific event.  People with GAD often worry excessively about many things in their lives, such as their health and  family.  GAD may cause physical symptoms such as restlessness, trouble concentrating, sleep problems, frequent sweating, nausea, diarrhea, headaches, and trembling or muscle twitching.  A mental health specialist can help determine which treatment is best for you. Some people see improvement with one type of therapy. However, other people require a combination of therapies. This information is not intended to replace advice given to you by your health care provider. Make sure you discuss any questions you have with your health care provider. Document Released: 11/14/2012 Document Revised: 06/09/2016 Document Reviewed: 06/09/2016 Elsevier Interactive Patient Education  2019 [employer] Inc[employer]  

## 2018-12-09 NOTE — Progress Notes (Signed)
**Note De-Identified Massie Obfuscation** There were no vitals taken for this visit.   Subjective:    Patient ID: Haley [name], female    DOB: 1961-01-20, 58 y.o.   MRN: [id number]  HPI: Tawan Corkern Goodspeed is a 58 y.o. female  Chief Complaint  Patient presents with  . Anxiety    . This visit was completed [place] telephone due to the restrictions of the COVID-19 pandemic. All issues as above were discussed and addressed but no physical exam was performed. If it was felt that the patient should be evaluated in the office, they were directed there. The patient verbally consented to this visit. Patient was unable to complete an audio/visual visit due to Lack of equipment. Due to the catastrophic nature of the COVID-19 pandemic, this visit was done through audio contact only. . Location of the patient: home . Location of the provider: home . Those involved with this call:  . Provider: Marnee Guarneri, DNP . CMA: Yvonna Alanis, CMA . Front Desk/Registration: Jill Side  . Time spent on call: 15 minutes on the phone discussing health concerns. 10 minutes total spent in review of patient's record and preparation of their chart. I verified patient identity using two factors (patient name and date of birth). Patient consents verbally to being seen [facility] telemedicine visit today.   ANXIETY/STRESS She reports he family tells her she is a hypochondriac.  Continues on Citalopram 40 MG and Mirtazapine 30 MG.  Reports some anxiety and when she "gets real upset it all goes to my belly and I throw-up".  States that with the current COVID pandemic she has had increased anxiety and some panic attacks, inquired as what she could do for this.  Discussed medications like Buspar and Hydroxyzine with patient.  She would like to try Hydroxyzine. Duration:exacerbated Anxious mood: yes  Excessive worrying: yes Irritability: no  Sweating: no Nausea: yes Palpitations:no Hyperventilation: no Panic attacks: yes Agoraphobia: no  Obscessions/compulsions: no  Depressed mood: no Depression screen [facility] 2/9 12/09/2018 03/17/2018 02/14/2018 11/03/2017 05/28/2017  Decreased Interest 2 1 3 1 3   Down, Depressed, Hopeless 3 0 2 0 3  PHQ - 2 Score 5 1 5 1 6   Altered sleeping 3 2 3 1 2   Tired, decreased energy 2 1 3 1 3   Change in appetite 2 0 3 1 2   Feeling bad or failure about yourself  0 0 2 0 0  Trouble concentrating 0 0 2 0 2  Moving slowly or fidgety/restless 0 0 0 0 0  Suicidal thoughts 0 0 0 0 0  PHQ-9 Score 12 4 18 4 15   Difficult doing work/chores Not difficult at all - - - -  Some recent data might be hidden   Anhedonia: no Weight changes: no Insomnia: yes hard to fall asleep  Hypersomnia: no Fatigue/loss of energy: no Feelings of worthlessness: no Feelings of guilt: no Impaired concentration/indecisiveness: yes Suicidal ideations: no  Crying spells: no Recent Stressors/Life Changes: no   Relationship problems: no   Family stress: no     Financial stress: no    Job stress: no    Recent death/loss: no  GAD 7 : Generalized Anxiety Score 12/09/2018 03/17/2018  Nervous, Anxious, on Edge 2 3  Control/stop worrying 2 2  Worry too much - different things 2 2  Trouble relaxing 2 2  Restless 0 1  Easily annoyed or irritable 0 1  Afraid - awful might happen 2 1  Total GAD 7 Score 10 12  Anxiety Difficulty Not **Note De-Identified [place]on Obfuscation** difficult at all -     Relevant past medical, surgical, family and social history reviewed and updated as indicated. Interim medical history since our last visit reviewed. Allergies and medications reviewed and updated.  Review of Systems  Constitutional: Negative for activity change, appetite change, diaphoresis, fatigue and fever.  Respiratory: Negative for cough, chest tightness and shortness of breath.   Cardiovascular: Negative for chest pain, palpitations and leg swelling.  Gastrointestinal: Negative for abdominal distention, abdominal pain, constipation, diarrhea, nausea and vomiting.  Neurological: Negative for dizziness,  syncope, weakness, light-headedness, numbness and headaches.  Psychiatric/Behavioral: Negative.     Per HPI unless specifically indicated above     Objective:    There were no vitals taken for this visit.  Wt Readings from Last 3 Encounters:  08/26/18 107 lb 6.4 oz (48.7 kg)  08/10/18 103 lb (46.7 kg)  07/13/18 103 lb (46.7 kg)    Physical Exam   Unable to obtain due to patient with no visual access.  Results for orders placed or performed during the hospital encounter of 32/20/25  Basic metabolic panel  Result Value Ref Range   Sodium 139 135 - 145 mmol/L   Potassium 3.4 (L) 3.5 - 5.1 mmol/L   Chloride 106 98 - 111 mmol/L   CO2 21 (L) 22 - 32 mmol/L   Glucose, Bld 124 (H) 70 - 99 mg/dL   BUN 12 6 - 20 mg/dL   Creatinine, Ser 0.69 0.44 - 1.00 mg/dL   Calcium 9.1 8.9 - 10.3 mg/dL   GFR calc non Af Amer >60 >60 mL/min   GFR calc Af Amer >60 >60 mL/min   Anion gap 12 5 - 15  CBC  Result Value Ref Range   WBC 10.3 4.0 - 10.5 K/uL   RBC 4.41 3.87 - 5.11 MIL/uL   Hemoglobin 13.9 12.0 - 15.0 g/dL   HCT 42.4 36.0 - 46.0 %   MCV 96.1 80.0 - 100.0 fL   MCH 31.5 26.0 - 34.0 pg   MCHC 32.8 30.0 - 36.0 g/dL   RDW 14.0 11.5 - 15.5 %   Platelets 388 150 - 400 K/uL   nRBC 0.0 0.0 - 0.2 %  Troponin I - ONCE - STAT  Result Value Ref Range   Troponin I <0.03 <0.03 ng/mL      Assessment & Plan:   Problem List Items Addressed This Visit      Other   Anxiety    Chronic, ongoing.  Continue current medication regimen.  Add Hydroxyzine as needed for increased anxiety.      Relevant Medications   hydrOXYzine (VISTARIL) 25 MG capsule   Depression, major, single episode, moderate (HCC) - Primary    Chronic, ongoing.  Continue current medication regimen.  Add Hydroxyzine as needed for anxiety.      Relevant Medications   hydrOXYzine (VISTARIL) 25 MG capsule      I discussed the assessment and treatment plan with the patient. The patient was provided an opportunity to ask  questions and all were answered. The patient agreed with the plan and demonstrated an understanding of the instructions.   The patient was advised to call back or seek an in-person evaluation if the symptoms worsen or if the condition fails to improve as anticipated.   I provided 15 minutes of time during this encounter.  Follow up plan: Return in about 6 months (around 06/11/2019) for Mood, COPD, GERD.

## 2019-01-30 ENCOUNTER — Other Ambulatory Visit: Payer: Self-pay | Admitting: Nurse Practitioner

## 2019-02-10 ENCOUNTER — Ambulatory Visit
Admission: RE | Admit: 2019-02-10 | Discharge: 2019-02-10 | Disposition: A | Discharge status: Home / Self Care | Payer: 59 | Source: Ambulatory Visit | Attending: Nurse Practitioner | Admitting: Nurse Practitioner

## 2019-02-10 ENCOUNTER — Ambulatory Visit (INDEPENDENT_AMBULATORY_CARE_PROVIDER_SITE_OTHER): Payer: 59 | Admitting: Nurse Practitioner

## 2019-02-10 ENCOUNTER — Encounter: Payer: Self-pay | Admitting: Nurse Practitioner

## 2019-02-10 ENCOUNTER — Other Ambulatory Visit: Payer: Self-pay

## 2019-02-10 VITALS — Wt 112.0 lb

## 2019-02-10 DIAGNOSIS — M79671 Pain in right foot: Secondary | ICD-10-CM | POA: Insufficient documentation
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
## 2019-02-10 NOTE — Patient Instructions (Signed)
**Note De-Identified Schaff Obfuscation** Foot Pain Many things can cause foot pain. Some common causes are:  An injury.  A sprain.  Arthritis.  Blisters.  Bunions. Follow these instructions at home: Managing pain, stiffness, and swelling If directed, put ice on the painful area:  Put ice in a plastic bag.  Place a towel between your skin and the bag.  Leave the ice on for 20 minutes, 2-3 times a day.  Activity  Do not stand or walk for long periods.  Return to your normal activities as told by your health care provider. Ask your health care provider what activities are safe for you.  Do stretches to relieve foot pain and stiffness as told by your health care provider.  Do not lift anything that is heavier than 10 lb (4.5 kg), or the limit that you are told, until your health care provider says that it is safe. Lifting a lot of weight can put added pressure on your feet. Lifestyle  Wear comfortable, supportive shoes that fit you well. Do not wear high heels.  Keep your feet clean and dry. General instructions  Take over-the-counter and prescription medicines only as told by your health care provider.  Rub your foot gently.  Pay attention to any changes in your symptoms.  Keep all follow-up visits as told by your health care provider. This is important. Contact a health care provider if:  Your pain does not get better after a few days of self-care.  Your pain gets worse.  You cannot stand on your foot. Get help right away if:  Your foot is numb or tingling.  Your foot or toes are swollen.  Your foot or toes turn white or blue.  You have warmth and redness along your foot. Summary  Common causes of foot pain are injury, sprain, arthritis, blisters or bunions.  Ice, medicines, and comfortable shoes may help foot pain.  Contact your health care provider if your pain does not get better after a few days of self-care. This information is not intended to replace advice given to you by your health  care provider. Make sure you discuss any questions you have with your health care provider. Document Released: 08/16/2015 Document Revised: 05/05/2018 Document Reviewed: 05/05/2018 Elsevier Patient Education  2020 [employer].

## 2019-02-10 NOTE — Progress Notes (Signed)
**Note De-Identified [place] Obfuscation** Spoke to patient Haley [name] telephone.  Imaging noting no fracture of foot.  Discussed with her most likely soft tissue injury only.  She is to continue to ice, rest, and take APAP as needed.  If worsening symptoms or continued symptoms to return to office.

## 2019-02-11 ENCOUNTER — Other Ambulatory Visit: Payer: Self-pay | Admitting: Unknown Physician Specialty

## 2019-02-11 DIAGNOSIS — K859 Acute pancreatitis without necrosis or infection, unspecified: Secondary | ICD-10-CM

## 2019-02-12 NOTE — Telephone Encounter (Signed)
**Note De-Identified [place] Obfuscation** Requested Prescriptions  Pending Prescriptions Disp Refills  . mirtazapine (REMERON) 30 MG tablet [Pharmacy Med Name: Mirtazapine 30 MG Oral Tablet] 30 tablet 0    Sig: TAKE 1 TABLET BY MOUTH AT BEDTIME     Psychiatry: Antidepressants - mirtazapine Failed - 02/11/2019  4:14 PM      Failed - AST in normal range and within 360 days    AST  Date Value Ref Range Status  11/03/2017 16 0 - 40 IU/L Final   SGOT(AST)  Date Value Ref Range Status  05/17/2014 19 15 - 37 Unit/L Final         Failed - ALT in normal range and within 360 days    ALT  Date Value Ref Range Status  11/03/2017 11 0 - 32 IU/L Final   SGPT (ALT)  Date Value Ref Range Status  05/17/2014 28 U/L Final    Comment:    14-63 NOTE: New Reference Range 02/20/14          Failed - Triglycerides in normal range and within 360 days    Triglycerides  Date Value Ref Range Status  01/13/2017 189 (H) 0 - 149 mg/dL Final  01/12/2014 238 (H) 0 - 200 mg/dL Final         Failed - Total Cholesterol in normal range and within 360 days    Cholesterol, Total  Date Value Ref Range Status  01/13/2017 162 100 - 199 mg/dL Final   Cholesterol  Date Value Ref Range Status  01/12/2014 154 0 - 200 mg/dL Final         Passed - WBC in normal range and within 360 days    WBC  Date Value Ref Range Status  08/10/2018 10.3 4.0 - 10.5 K/uL Final         Passed - Valid encounter within last 6 months    Recent Outpatient Visits          2 days ago Acute foot pain, right   [place], Jolene T, NP   2 months ago Depression, major, single episode, moderate ([place])   [place] Pella, [place] T, NP   5 months ago Rib pain on right side   [place], [place], PA-C   7 months ago Acute recurrent maxillary sinusitis   [place], Fairway T, NP   11 months ago Insomnia, unspecified type   [facility] Trinna Post, PA-C      Future  Appointments            In 4 months [place], Barbaraann Faster, NP [facility]           Passed - Completed PHQ-2 or PHQ-9 in the last 360 days.

## 2019-02-25 ENCOUNTER — Other Ambulatory Visit: Payer: Self-pay | Admitting: Nurse Practitioner

## 2019-02-25 DIAGNOSIS — F419 Anxiety disorder, unspecified: Secondary | ICD-10-CM

## 2019-02-25 NOTE — Telephone Encounter (Signed)
**Note De-Identified [place] Obfuscation** Requested Prescriptions  Pending Prescriptions Disp Refills  . citalopram (CELEXA) 40 MG tablet [Pharmacy Med Name: Citalopram Hydrobromide 40 MG Oral Tablet] 90 tablet 0    Sig: Take 1 tablet by mouth once daily     Psychiatry:  Antidepressants - SSRI Passed - 02/25/2019  2:19 PM      Passed - Valid encounter within last 6 months    Recent Outpatient Visits          2 weeks ago Acute foot pain, right   [facility] [place], Jolene T, NP   2 months ago Depression, major, single episode, moderate ([place])   [place], Lilly T, NP   6 months ago Rib pain on right side   [place], [place], [place]   7 months ago Acute recurrent maxillary sinusitis   [place], Beebe T, NP   11 months ago Insomnia, unspecified type   [facility] Trinna Post, PA-C      Future Appointments            In 4 days [place] Desanctis, Lilia Argue, PA-C [facility], [place]   In 3 months [place], Barbaraann Faster, NP [facility]           Passed - Completed PHQ-2 or PHQ-9 in the last 360 days.

## 2019-03-01 ENCOUNTER — Encounter: Payer: Self-pay | Admitting: Family Medicine

## 2019-03-01 ENCOUNTER — Ambulatory Visit (INDEPENDENT_AMBULATORY_CARE_PROVIDER_SITE_OTHER): Payer: 59 | Admitting: Family Medicine

## 2019-03-01 ENCOUNTER — Other Ambulatory Visit: Payer: Self-pay

## 2019-03-01 VITALS — BP 151/88 | HR 90 | Temp 99.2°F | Ht 60.0 in | Wt 115.0 lb

## 2019-03-01 DIAGNOSIS — M79671 Pain in right foot: Secondary | ICD-10-CM

## 2019-03-01 DIAGNOSIS — R05 Cough: Secondary | ICD-10-CM

## 2019-03-01 DIAGNOSIS — F419 Anxiety disorder, unspecified: Secondary | ICD-10-CM

## 2019-03-01 DIAGNOSIS — R059 Cough, unspecified: Secondary | ICD-10-CM

## 2019-03-01 MED ORDER — QUETIAPINE FUMARATE 50 MG PO TABS: 50.0000 mg | ORAL_TABLET | Freq: Every day | ORAL | 0 refills | Status: DC
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
## 2019-03-15 ENCOUNTER — Ambulatory Visit: Payer: 59 | Admitting: Nurse Practitioner

## 2019-03-24 ENCOUNTER — Other Ambulatory Visit: Payer: Self-pay | Admitting: Nurse Practitioner

## 2019-03-24 DIAGNOSIS — K859 Acute pancreatitis without necrosis or infection, unspecified: Secondary | ICD-10-CM

## 2019-03-24 NOTE — Telephone Encounter (Signed)
**Note De-identified [place] Obfuscation** Forwarding medication refill request to PCP for review. 

## 2019-03-31 ENCOUNTER — Ambulatory Visit (INDEPENDENT_AMBULATORY_CARE_PROVIDER_SITE_OTHER): Payer: 59 | Admitting: Nurse Practitioner

## 2019-03-31 ENCOUNTER — Encounter: Payer: Self-pay | Admitting: Nurse Practitioner

## 2019-03-31 ENCOUNTER — Other Ambulatory Visit: Payer: Self-pay

## 2019-03-31 DIAGNOSIS — F321 Major depressive disorder, single episode, moderate: Secondary | ICD-10-CM

## 2019-03-31 DIAGNOSIS — M79671 Pain in right foot: Secondary | ICD-10-CM

## 2019-03-31 DIAGNOSIS — F411 Generalized anxiety disorder: Secondary | ICD-10-CM | POA: Diagnosis not present

## 2019-03-31 MED ORDER — TRAZODONE HCL 50 MG PO TABS: 50.0000 mg | ORAL_TABLET | Freq: Every day | ORAL | 1 refills | Status: DC

## 2019-03-31 NOTE — Assessment & Plan Note (Signed)
**Note De-Identified [place] Obfuscation** Chronic, ongoing.  Will discontinue Seroquel and Mirtazapine at night and start Trazodone 25 MG QHS + continue Citalopram daily and Vistaril as needed.  Trazodone may benefit sleep and mood.  Return in 4 weeks for follow-up.

## 2019-03-31 NOTE — Assessment & Plan Note (Addendum)
**Note De-Identified [place] Obfuscation** Has not followed up with podiatry, provided number to her today to schedule this and recommended further follow-up with them for ongoing discomfort and mild edema.  Continue rest and simple treatment at home.
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
## 2019-03-31 NOTE — Patient Instructions (Signed)
**Note De-identified Tunks Obfuscation** Generalized Anxiety Disorder, Adult Generalized anxiety disorder (GAD) is a mental health disorder. People with this condition constantly worry about everyday events. Unlike normal anxiety, worry related to GAD is not triggered by a specific event. These worries also do not fade or get better with time. GAD interferes with life functions, including relationships, work, and school. GAD can vary from mild to severe. People with severe GAD can have intense waves of anxiety with physical symptoms (panic attacks). What are the causes? The exact cause of GAD is not known. What increases the risk? This condition is more likely to develop in:  Women.  People who have a family history of anxiety disorders.  People who are very shy.  People who experience very stressful life events, such as the death of a loved one.  People who have a very stressful family environment. What are the signs or symptoms? People with GAD often worry excessively about many things in their lives, such as their health and family. They may also be overly concerned about:  Doing well at work.  Being on time.  Natural disasters.  Friendships. Physical symptoms of GAD include:  Fatigue.  Muscle tension or having muscle twitches.  Trembling or feeling shaky.  Being easily startled.  Feeling like your heart is pounding or racing.  Feeling out of breath or like you cannot take a deep breath.  Having trouble falling asleep or staying asleep.  Sweating.  Nausea, diarrhea, or irritable bowel syndrome (IBS).  Headaches.  Trouble concentrating or remembering facts.  Restlessness.  Irritability. How is this diagnosed? Your health care provider can diagnose GAD based on your symptoms and medical history. You will also have a physical exam. The health care provider will ask specific questions about your symptoms, including how severe they are, when they started, and if they come and go. Your health care  provider may ask you about your use of alcohol or drugs, including prescription medicines. Your health care provider may refer you to a mental health specialist for further evaluation. Your health care provider will do a thorough examination and may perform additional tests to rule out other possible causes of your symptoms. To be diagnosed with GAD, a person must have anxiety that:  Is out of his or her control.  Affects several different aspects of his or her life, such as work and relationships.  Causes distress that makes him or her unable to take part in normal activities.  Includes at least three physical symptoms of GAD, such as restlessness, fatigue, trouble concentrating, irritability, muscle tension, or sleep problems. Before your health care provider can confirm a diagnosis of GAD, these symptoms must be present more days than they are not, and they must last for six months or longer. How is this treated? The following therapies are usually used to treat GAD:  Medicine. Antidepressant medicine is usually prescribed for long-term daily control. Antianxiety medicines may be added in severe cases, especially when panic attacks occur.  Talk therapy (psychotherapy). Certain types of talk therapy can be helpful in treating GAD by providing support, education, and guidance. Options include: ? Cognitive behavioral therapy (CBT). People learn coping skills and techniques to ease their anxiety. They learn to identify unrealistic or negative thoughts and behaviors and to replace them with positive ones. ? Acceptance and commitment therapy (ACT). This treatment teaches people how to be mindful as a way to cope with unwanted thoughts and feelings. ? Biofeedback. This process trains you to manage your body's response ( **Note De-identified [place]ing Obfuscation** physiological response) through breathing techniques and relaxation methods. You will work with a therapist while machines are used to monitor your physical symptoms.  Stress  management techniques. These include yoga, meditation, and exercise. A mental health specialist can help determine which treatment is best for you. Some people see improvement with one type of therapy. However, other people require a combination of therapies. Follow these instructions at home:  Take over-the-counter and prescription medicines only as told by your health care provider.  Try to maintain a normal routine.  Try to anticipate stressful situations and allow extra time to manage them.  Practice any stress management or self-calming techniques as taught by your health care provider.  Do not punish yourself for setbacks or for not making progress.  Try to recognize your accomplishments, even if they are small.  Keep all follow-up visits as told by your health care provider. This is important. Contact a health care provider if:  Your symptoms do not get better.  Your symptoms get worse.  You have signs of depression, such as: ? A persistently sad, cranky, or irritable mood. ? Loss of enjoyment in activities that used to bring you joy. ? Change in weight or eating. ? Changes in sleeping habits. ? Avoiding friends or family members. ? Loss of energy for normal tasks. ? Feelings of guilt or worthlessness. Get help right away if:  You have serious thoughts about hurting yourself or others. If you ever feel like you may hurt yourself or others, or have thoughts about taking your own life, get help right away. You can go to your nearest emergency department or call:  Your local emergency services (911 in the [place]  A suicide crisis helpline, such as the National Suicide Prevention Lifeline at 1-800-273-8255. This is open 24 hours a day. Summary  Generalized anxiety disorder (GAD) is a mental health disorder that involves worry that is not triggered by a specific event.  People with GAD often worry excessively about many things in their lives, such as their health and  family.  GAD may cause physical symptoms such as restlessness, trouble concentrating, sleep problems, frequent sweating, nausea, diarrhea, headaches, and trembling or muscle twitching.  A mental health specialist can help determine which treatment is best for you. Some people see improvement with one type of therapy. However, other people require a combination of therapies. This information is not intended to replace advice given to you by your health care provider. Make sure you discuss any questions you have with your health care provider. Document Released: 11/14/2012 Document Revised: 07/02/2017 Document Reviewed: 06/09/2016 Elsevier Patient Education  2020 [employer]  
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
## 2019-04-11 ENCOUNTER — Encounter

## 2019-04-11 ENCOUNTER — Ambulatory Visit (INDEPENDENT_AMBULATORY_CARE_PROVIDER_SITE_OTHER): Payer: PRIVATE HEALTH INSURANCE

## 2019-04-11 ENCOUNTER — Other Ambulatory Visit: Payer: Self-pay

## 2019-04-11 ENCOUNTER — Encounter: Payer: Self-pay | Admitting: Podiatry

## 2019-04-11 ENCOUNTER — Other Ambulatory Visit: Payer: Self-pay | Admitting: Podiatry

## 2019-04-11 ENCOUNTER — Ambulatory Visit (INDEPENDENT_AMBULATORY_CARE_PROVIDER_SITE_OTHER): Payer: PRIVATE HEALTH INSURANCE | Admitting: Podiatry

## 2019-04-11 DIAGNOSIS — S92334D Nondisplaced fracture of third metatarsal bone, right foot, subsequent encounter for fracture with routine healing: Secondary | ICD-10-CM

## 2019-04-11 DIAGNOSIS — M779 Enthesopathy, unspecified: Secondary | ICD-10-CM
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
## 2019-05-01 ENCOUNTER — Other Ambulatory Visit: Payer: Self-pay

## 2019-05-01 ENCOUNTER — Encounter: Payer: Self-pay | Admitting: Nurse Practitioner

## 2019-05-01 ENCOUNTER — Ambulatory Visit (INDEPENDENT_AMBULATORY_CARE_PROVIDER_SITE_OTHER): Payer: 59 | Admitting: Nurse Practitioner

## 2019-05-01 VITALS — BP 128/84 | HR 82 | Temp 99.0°F

## 2019-05-01 DIAGNOSIS — F411 Generalized anxiety disorder: Secondary | ICD-10-CM

## 2019-05-01 DIAGNOSIS — G4700 Insomnia, unspecified: Secondary | ICD-10-CM | POA: Insufficient documentation

## 2019-05-01 DIAGNOSIS — F5101 Primary insomnia: Secondary | ICD-10-CM

## 2019-05-01 DIAGNOSIS — F321 Major depressive disorder, single episode, moderate: Secondary | ICD-10-CM | POA: Diagnosis not present

## 2019-05-01 MED ORDER — OMEPRAZOLE 20 MG PO CPDR: 20.0000 mg | DELAYED_RELEASE_CAPSULE | Freq: Every day | ORAL | 3 refills | Status: DC

## 2019-05-01 MED ORDER — SERTRALINE HCL 50 MG PO TABS: 50.0000 mg | ORAL_TABLET | Freq: Every day | ORAL | 3 refills | Status: DC

## 2019-05-01 MED ORDER — HYDROXYZINE PAMOATE 25 MG PO CAPS: ORAL_CAPSULE | ORAL | 1 refills | Status: DC

## 2019-05-01 MED ORDER — MONTELUKAST SODIUM 10 MG PO TABS: 10.0000 mg | ORAL_TABLET | Freq: Every day | ORAL | 3 refills | Status: DC

## 2019-05-01 NOTE — Assessment & Plan Note (Signed)
**Note De-Identified Melfi Obfuscation** Chronic, stable with addition of PRN Trazodone.  She reports benefit from this as needed and less grogginess.  Continue this regimen and adjust as needed.

## 2019-05-01 NOTE — Progress Notes (Addendum)
**Note De-Identified [place] Obfuscation** BP 128/84   Pulse 82   Temp 99 F (37.2 C) (Oral)   SpO2 96%    Subjective:    Patient ID: Haley [name], female    DOB: [birth year], 58 y.o.   MRN: [id number]  HPI: Haley [name] is a 58 y.o. female  Chief Complaint  Patient presents with  . Depression  . Insomnia   DEPRESSION Continues on Celexa 40 MG daily, Trazodone, and Vistaril 25 MG PRN, takes once or twice a day.  Patient states her sister and her daughter both take Sertraline, which works very well for her.  She has not tried this in past and wonders if it will work for her.   Mood status: exacerbated Satisfied with current treatment?: at times Symptom severity: moderate  Duration of current treatment : chronic Side effects: no Medication compliance: good compliance Psychotherapy/counseling: in past Previous psychiatric medications: Ativan, Celexa, Seroquel Depressed mood: no Anxious mood: yes Anhedonia: no Significant weight loss or gain: no Insomnia: yes hard to fall asleep Fatigue: no Feelings of worthlessness or guilt: no Impaired concentration/indecisiveness: yes Suicidal ideations: no Hopelessness: no Crying spells: no Depression screen [facility] 2/9 05/01/2019 03/31/2019 03/01/2019 12/09/2018 03/17/2018  Decreased Interest 3 2 2 2 1   Down, Depressed, Hopeless 2 2 2 3  0  PHQ - 2 Score 5 4 4 5 1   Altered sleeping 3 3 3 3 2   Tired, decreased energy 3 3 3 2 1   Change in appetite 2 2 2 2  0  Feeling bad or failure about yourself  0 0 0 0 0  Trouble concentrating 2 3 1  0 0  Moving slowly or fidgety/restless 0 0 0 0 0  Suicidal thoughts 0 0 0 0 0  PHQ-9 Score 15 15 13 12 4   Difficult doing work/chores Somewhat difficult Somewhat difficult - Not difficult at all -  Some recent data might be hidden   GAD 7 : Generalized Anxiety Score 05/01/2019 03/31/2019 03/01/2019 12/09/2018  Nervous, Anxious, on Edge 3 2 3 2   Control/stop worrying 2 3 3 2   Worry too much - different things 3 3 3 2   Trouble relaxing 3 3 3 2   Restless  2 3 3  0  Easily annoyed or irritable 3 3 2  0  Afraid - awful might happen 1 0 3 2  Total GAD 7 Score 17 17 20 10   Anxiety Difficulty Somewhat difficult Somewhat difficult Very difficult Not difficult at all   INSOMNIA Continues on Trazodone, started at last visit.  She states this is helping some with sleep and causes her to be less groggy when she wakes up. Duration: chronic Satisfied with sleep quality: no Difficulty falling asleep: yes Difficulty staying asleep: yes Waking a few hours after sleep onset: yes Early morning awakenings: no Daytime hypersomnolence: no Wakes feeling refreshed: no Good sleep hygiene: no Apnea: no Snoring: no Depressed/anxious mood: yes Recent stress: no Restless legs/nocturnal leg cramps: no Chronic pain/arthritis: no History of sleep study: no Treatments attempted: melatonin   Relevant past medical, surgical, family and social history reviewed and updated as indicated. Interim medical history since our last visit reviewed. Allergies and medications reviewed and updated.  Review of Systems  Constitutional: Negative for activity change, appetite change, diaphoresis, fatigue and fever.  Respiratory: Negative for cough, chest tightness and shortness of breath.   Cardiovascular: Negative for chest pain, palpitations and leg swelling.  Gastrointestinal: Negative for abdominal distention, abdominal pain, constipation, diarrhea, nausea and vomiting.  Endocrine: Negative for cold intolerance, **Note De-Identified [place] Obfuscation** heat intolerance, polydipsia, polyphagia and polyuria.  Neurological: Negative for dizziness, syncope, weakness, light-headedness, numbness and headaches.  Psychiatric/Behavioral: Positive for decreased concentration and sleep disturbance. Negative for self-injury and suicidal ideas. The patient is nervous/anxious.     Per HPI unless specifically indicated above     Objective:    BP 128/84   Pulse 82   Temp 99 F (37.2 C) (Oral)   SpO2 96%   Wt Readings from  Last 3 Encounters:  03/01/19 115 lb (52.2 kg)  02/10/19 112 lb (50.8 kg)  08/26/18 107 lb 6.4 oz (48.7 kg)    Physical Exam Vitals signs and nursing note reviewed.  Constitutional:      General: She is awake. She is not in acute distress.    Appearance: She is well-developed and overweight. She is not ill-appearing.  HENT:     Head: Normocephalic.     Right Ear: Hearing normal.     Left Ear: Hearing normal.  Eyes:     General: Lids are normal.        Right eye: No discharge.        Left eye: No discharge.     Conjunctiva/sclera: Conjunctivae normal.     Pupils: Pupils are equal, round, and reactive to light.  Neck:     Musculoskeletal: Normal range of motion and neck supple.     Thyroid: No thyromegaly.     Vascular: No carotid bruit.  Cardiovascular:     Rate and Rhythm: Normal rate and regular rhythm.     Heart sounds: Normal heart sounds. No murmur. No gallop.   Pulmonary:     Effort: Pulmonary effort is normal. No accessory muscle usage or respiratory distress.     Breath sounds: Normal breath sounds.  Abdominal:     General: Bowel sounds are normal.     Palpations: Abdomen is soft.  Musculoskeletal:     Right lower leg: No edema.     Left lower leg: No edema.  Skin:    General: Skin is warm and dry.  Neurological:     Mental Status: She is alert and oriented to person, place, and time.  Psychiatric:        Attention and Perception: Attention normal.        Mood and Affect: Mood normal.        Behavior: Behavior normal. Behavior is cooperative.        Thought Content: Thought content normal.        Judgment: Judgment normal.     Results for orders placed or performed during the hospital encounter of 0000000  Basic metabolic panel  Result Value Ref Range   Sodium 139 135 - 145 mmol/L   Potassium 3.4 (L) 3.5 - 5.1 mmol/L   Chloride 106 98 - 111 mmol/L   CO2 21 (L) 22 - 32 mmol/L   Glucose, Bld 124 (H) 70 - 99 mg/dL   BUN 12 6 - 20 mg/dL   Creatinine, Ser  0.69 0.44 - 1.00 mg/dL   Calcium 9.1 8.9 - 10.3 mg/dL   GFR calc non Af Amer >60 >60 mL/min   GFR calc Af Amer >60 >60 mL/min   Anion gap 12 5 - 15  CBC  Result Value Ref Range   WBC 10.3 4.0 - 10.5 K/uL   RBC 4.41 3.87 - 5.11 MIL/uL   Hemoglobin 13.9 12.0 - 15.0 g/dL   HCT 42.4 36.0 - 46.0 %   MCV 96.1 80.0 - 100.0 fL **Note De-Identified Wojdyla Obfuscation** MCH 31.5 26.0 - 34.0 pg   MCHC 32.8 30.0 - 36.0 g/dL   RDW 14.0 11.5 - 15.5 %   Platelets 388 150 - 400 K/uL   nRBC 0.0 0.0 - 0.2 %  Troponin I - ONCE - STAT  Result Value Ref Range   Troponin I <0.03 <0.03 ng/mL      Assessment & Plan:   Problem List Items Addressed This Visit      Other   Generalized anxiety disorder    Chronic, ongoing.  Will trial direct switch from Celexa to Sertraline 50 MG daily, this medication has benefited her daughter and sister.  This would benefit both anxiety and depression elements.  Continue Vistaril as needed.  Follow-up in 4 weeks and adjust dose as needed.      Relevant Medications   hydrOXYzine (VISTARIL) 25 MG capsule   sertraline (ZOLOFT) 50 MG tablet   Depression, major, single episode, moderate (HCC) - Primary    Chronic with recent exacerbation from traumatic event. Her sister and daughter have success with Zoloft, we discussed at length genetic studies in mental health.  Will trial off Celexa and do direct switch to Zoloft 50 MG, plus continue Vistaril as needed for acute anxiety. Not interested in therapy at this time.  Return in 4 weeks for follow-up.      Relevant Medications   hydrOXYzine (VISTARIL) 25 MG capsule   sertraline (ZOLOFT) 50 MG tablet   Insomnia    Chronic, stable with addition of PRN Trazodone.  She reports benefit from this as needed and less grogginess.  Continue this regimen and adjust as needed.            Follow up plan: Return in about 4 weeks (around 05/29/2019) for Mood follow-up.

## 2019-05-01 NOTE — Assessment & Plan Note (Signed)
**Note De-Identified [place]rippi Obfuscation** Chronic with recent exacerbation from traumatic event. Her sister and daughter have success with Zoloft, we discussed at length genetic studies in mental health.  Will trial off Celexa and do direct switch to Zoloft 50 MG, plus continue Vistaril as needed for acute anxiety. Not interested in therapy at this time.  Return in 4 weeks for follow-up.

## 2019-05-01 NOTE — Assessment & Plan Note (Signed)
**Note De-Identified [place] Obfuscation** Chronic, ongoing.  Will trial direct switch from Celexa to Sertraline 50 MG daily, this medication has benefited her daughter and sister.  This would benefit both anxiety and depression elements.  Continue Vistaril as needed.  Follow-up in 4 weeks and adjust dose as needed.

## 2019-05-01 NOTE — Patient Instructions (Signed)
**Note De-identified [place] Obfuscation** Living With Depression Everyone experiences occasional disappointment, sadness, and loss in their lives. When you are feeling down, blue, or sad for at least 2 weeks in a row, it may mean that you have depression. Depression can affect your thoughts and feelings, relationships, daily activities, and physical health. It is caused by changes in the way your brain functions. If you receive a diagnosis of depression, your health care provider will tell you which type of depression you have and what treatment options are available to you. If you are living with depression, there are ways to help you recover from it and also ways to prevent it from coming back. How to cope with lifestyle changes Coping with stress     Stress is your body's reaction to life changes and events, both good and bad. Stressful situations may include:  Getting married.  The death of a spouse.  Losing a job.  Retiring.  Having a baby. Stress can last just a few hours or it can be ongoing. Stress can play a major role in depression, so it is important to learn both how to cope with stress and how to think about it differently. Talk with your health care provider or a counselor if you would like to learn more about stress reduction. He or she may suggest some stress reduction techniques, such as:  Music therapy. This can include creating music or listening to music. Choose music that you enjoy and that inspires you.  Mindfulness-based meditation. This kind of meditation can be done while sitting or walking. It involves being aware of your normal breaths, rather than trying to control your breathing.  Centering prayer. This is a kind of meditation that involves focusing on a spiritual word or phrase. Choose a word, phrase, or sacred image that is meaningful to you and that brings you peace.  Deep breathing. To do this, expand your stomach and inhale slowly through your nose. Hold your breath for 3-5 seconds, then exhale  slowly, allowing your stomach muscles to relax.  Muscle relaxation. This involves intentionally tensing muscles then relaxing them. Choose a stress reduction technique that fits your lifestyle and personality. Stress reduction techniques take time and practice to develop. Set aside 5-15 minutes a day to do them. Therapists can offer training in these techniques. The training may be covered by some insurance plans. Other things you can do to manage stress include:  Keeping a stress diary. This can help you learn what triggers your stress and ways to control your response.  Understanding what your limits are and saying no to requests or events that lead to a schedule that is too full.  Thinking about how you respond to certain situations. You may not be able to control everything, but you can control how you react.  Adding humor to your life by watching funny films or TV shows.  Making time for activities that help you relax and not feeling guilty about spending your time this way.  Medicines Your health care provider may suggest certain medicines if he or she feels that they will help improve your condition. Avoid using alcohol and other substances that may prevent your medicines from working properly (may interact). It is also important to:  Talk with your pharmacist or health care provider about all the medicines that you take, their possible side effects, and what medicines are safe to take together.  Make it your goal to take part in all treatment decisions (shared decision-making). This includes giving input on  **Note De-identified Hoon Obfuscation** the side effects of medicines. It is best if shared decision-making with your health care provider is part of your total treatment plan. If your health care provider prescribes a medicine, you may not notice the full benefits of it for 4-8 weeks. Most people who are treated for depression need to be on medicine for at least 6-12 months after they feel better. If you are taking  medicines as part of your treatment, do not stop taking medicines without first talking to your health care provider. You may need to have the medicine slowly decreased (tapered) over time to decrease the risk of harmful side effects. Relationships Your health care provider may suggest family therapy along with individual therapy and drug therapy. While there may not be family problems that are causing you to feel depressed, it is still important to make sure your family learns as much as they can about your mental health. Having your family's support can help make your treatment successful. How to recognize changes in your condition Everyone has a different response to treatment for depression. Recovery from major depression happens when you have not had signs of major depression for two months. This may mean that you will start to:  Have more interest in doing activities.  Feel less hopeless than you did 2 months ago.  Have more energy.  Overeat less often, or have better or improving appetite.  Have better concentration. Your health care provider will work with you to decide the next steps in your recovery. It is also important to recognize when your condition is getting worse. Watch for these signs:  Having fatigue or low energy.  Eating too much or too little.  Sleeping too much or too little.  Feeling restless, agitated, or hopeless.  Having trouble concentrating or making decisions.  Having unexplained physical complaints.  Feeling irritable, angry, or aggressive. Get help as soon as you or your family members notice these symptoms coming back. How to get support and help from others How to talk with friends and family members about your condition  Talking to friends and family members about your condition can provide you with one way to get support and guidance. Reach out to trusted friends or family members, explain your symptoms to them, and let them know that you are  working with a health care provider to treat your depression. Financial resources Not all insurance plans cover mental health care, so it is important to check with your insurance carrier. If paying for co-pays or counseling services is a problem, search for a local or county mental health care center. They may be able to offer public mental health care services at low or no cost when you are not able to see a private health care provider. If you are taking medicine for depression, you may be able to get the generic form, which may be less expensive. Some makers of prescription medicines also offer help to patients who cannot afford the medicines they need. Follow these instructions at home:   Get the right amount and quality of sleep.  Cut down on using caffeine, tobacco, alcohol, and other potentially harmful substances.  Try to exercise, such as walking or lifting small weights.  Take over-the-counter and prescription medicines only as told by your health care provider.  Eat a healthy diet that includes plenty of vegetables, fruits, whole grains, low-fat dairy products, and lean protein. Do not eat a lot of foods that are high in solid fats, added sugars, or salt.   **Note De-identified [place] Obfuscation** Keep all follow-up visits as told by your health care provider. This is important. Contact a health care provider if:  You stop taking your antidepressant medicines, and you have any of these symptoms: ? Nausea. ? Headache. ? Feeling lightheaded. ? Chills and body aches. ? Not being able to sleep (insomnia).  You or your friends and family think your depression is getting worse. Get help right away if:  You have thoughts of hurting yourself or others. If you ever feel like you may hurt yourself or others, or have thoughts about taking your own life, get help right away. You can go to your nearest emergency department or call:  Your local emergency services (911 in the [place]  A suicide crisis helpline, such as the  National Suicide Prevention Lifeline at 1-800-273-8255. This is open 24-hours a day. Summary  If you are living with depression, there are ways to help you recover from it and also ways to prevent it from coming back.  Work with your health care team to create a management plan that includes counseling, stress management techniques, and healthy lifestyle habits. This information is not intended to replace advice given to you by your health care provider. Make sure you discuss any questions you have with your health care provider. Document Released: 06/22/2016 Document Revised: 11/11/2018 Document Reviewed: 06/22/2016 Elsevier Patient Education  2020 [employer]  

## 2019-05-23 ENCOUNTER — Ambulatory Visit: Payer: PRIVATE HEALTH INSURANCE | Admitting: Podiatry

## 2019-05-26 ENCOUNTER — Encounter: Payer: Self-pay | Admitting: Nurse Practitioner

## 2019-05-26 ENCOUNTER — Ambulatory Visit (INDEPENDENT_AMBULATORY_CARE_PROVIDER_SITE_OTHER): Payer: 59 | Admitting: Nurse Practitioner

## 2019-05-26 VITALS — BP 138/93 | HR 96 | Temp 99.7°F

## 2019-05-26 DIAGNOSIS — F411 Generalized anxiety disorder: Secondary | ICD-10-CM

## 2019-05-26 DIAGNOSIS — F321 Major depressive disorder, single episode, moderate: Secondary | ICD-10-CM | POA: Diagnosis not present

## 2019-05-26 MED ORDER — SERTRALINE HCL 100 MG PO TABS: 100.0000 mg | ORAL_TABLET | Freq: Every day | ORAL | 3 refills | Status: DC

## 2019-05-26 NOTE — Assessment & Plan Note (Addendum)
**Note De-Identified [place] Obfuscation** Chronic, ongoing.  Denies SI/HI.  Will increase Sertraline to 100 MG, this medication has benefited her daughter and sister.  Script sent.  This would benefit both anxiety and depression elements.  Continue Vistaril as needed.  Referral placed for therapy, patient also agrees with would be beneficial.  Obtain CMP today. Follow-up in 4 weeks and adjust dose as needed.

## 2019-05-26 NOTE — Progress Notes (Signed)
**Note De-Identified [place] Obfuscation** BP (!) 138/93 (BP Location: Left Arm, Cuff Size: Normal)   Pulse 96   Temp 99.7 F (37.6 C) (Oral)   SpO2 97%    Subjective:    Patient ID: Haley [name], female    DOB: 03/06/1961, 58 y.o.   MRN: [id number]  HPI: Haley [name] is a 58 y.o. female  Chief Complaint  Patient presents with  . Depression    4 week f.up  . Labs Only    pt states she would like her electrolytes checked today    DEPRESSION Switched from Celexa to Sertraline at last visit, 50 MG, and Vistaril as needed.  Reports the Vistaril works well when she needs it, does not noticed much difference with Sertraline yet but reports this works with her sister and daughter.  Takes Trazodone at bedtime 50 MG for sleep.  Would like electrolytes checked today and states Dr. Bary Castilla needs these sent to him once returned, as she saw him recently for abdominal pain which he feels is stress related due to her recent home break in and loss of her home. Mood status: stable Satisfied with current treatment?: yes Symptom severity: moderate  Duration of current treatment : chronic Side effects: no Medication compliance: good compliance Psychotherapy/counseling: yes in the past Previous psychiatric medications: Celexa Depressed mood: yes Anxious mood: yes Anhedonia: no Significant weight loss or gain: no Insomnia: none Fatigue: no Feelings of worthlessness or guilt: no Impaired concentration/indecisiveness: yes Suicidal ideations: no Hopelessness: no Crying spells: no Depression screen [facility] 2/9 05/26/2019 05/01/2019 03/31/2019 03/01/2019 12/09/2018  Decreased Interest 2 3 2 2 2   Down, Depressed, Hopeless 1 2 2 2 3   PHQ - 2 Score 3 5 4 4 5   Altered sleeping 3 3 3 3 3   Tired, decreased energy 3 3 3 3 2   Change in appetite 3 2 2 2 2   Feeling bad or failure about yourself  0 0 0 0 0  Trouble concentrating 0 2 3 1  0  Moving slowly or fidgety/restless 0 0 0 0 0  Suicidal thoughts 0 0 0 0 0  PHQ-9 Score 12 15 15 13 12   Difficult  doing work/chores Somewhat difficult Somewhat difficult Somewhat difficult - Not difficult at all  Some recent data might be hidden   GAD 7 : Generalized Anxiety Score 05/26/2019 05/01/2019 03/31/2019 03/01/2019  Nervous, Anxious, on Edge 3 3 2 3   Control/stop worrying 2 2 3 3   Worry too much - different things 3 3 3 3   Trouble relaxing 3 3 3 3   Restless 0 2 3 3   Easily annoyed or irritable 2 3 3 2   Afraid - awful might happen 0 1 0 3  Total GAD 7 Score 13 17 17 20   Anxiety Difficulty Somewhat difficult Somewhat difficult Somewhat difficult Very difficult   Relevant past medical, surgical, family and social history reviewed and updated as indicated. Interim medical history since our last visit reviewed. Allergies and medications reviewed and updated.  Review of Systems  Constitutional: Negative for activity change, appetite change, diaphoresis, fatigue and fever.  Respiratory: Negative for cough, chest tightness and shortness of breath.   Cardiovascular: Negative for chest pain, palpitations and leg swelling.  Gastrointestinal: Negative for abdominal distention, abdominal pain, constipation, diarrhea, nausea and vomiting.  Neurological: Negative for dizziness, syncope, weakness, light-headedness, numbness and headaches.  Psychiatric/Behavioral: Positive for decreased concentration. Negative for self-injury, sleep disturbance and suicidal ideas. The patient is nervous/anxious.     Per HPI unless specifically indicated **Note De-Identified [place]ella Obfuscation** above     Objective:    BP (!) 138/93 (BP Location: Left Arm, Cuff Size: Normal)   Pulse 96   Temp 99.7 F (37.6 C) (Oral)   SpO2 97%   Wt Readings from Last 3 Encounters:  03/01/19 115 lb (52.2 kg)  02/10/19 112 lb (50.8 kg)  08/26/18 107 lb 6.4 oz (48.7 kg)    Physical Exam Vitals signs and nursing note reviewed.  Constitutional:      General: She is awake. She is not in acute distress.    Appearance: She is well-developed. She is not ill-appearing.  HENT:      Head: Normocephalic.     Right Ear: Hearing normal.     Left Ear: Hearing normal.  Eyes:     General: Lids are normal.        Right eye: No discharge.        Left eye: No discharge.     Conjunctiva/sclera: Conjunctivae normal.     Pupils: Pupils are equal, round, and reactive to light.  Neck:     Musculoskeletal: Normal range of motion and neck supple.     Vascular: No carotid bruit.  Cardiovascular:     Rate and Rhythm: Normal rate and regular rhythm.     Heart sounds: Normal heart sounds. No murmur. No gallop.   Pulmonary:     Effort: Pulmonary effort is normal. No accessory muscle usage or respiratory distress.     Breath sounds: Normal breath sounds.  Abdominal:     General: Bowel sounds are normal.     Palpations: Abdomen is soft.  Musculoskeletal:     Right lower leg: No edema.     Left lower leg: No edema.  Skin:    General: Skin is warm and dry.  Neurological:     Mental Status: She is alert and oriented to person, place, and time.  Psychiatric:        Attention and Perception: Attention normal.        Mood and Affect: Mood normal.        Behavior: Behavior normal. Behavior is cooperative.        Thought Content: Thought content normal.        Judgment: Judgment normal.     Results for orders placed or performed during the hospital encounter of 0000000  Basic metabolic panel  Result Value Ref Range   Sodium 139 135 - 145 mmol/L   Potassium 3.4 (L) 3.5 - 5.1 mmol/L   Chloride 106 98 - 111 mmol/L   CO2 21 (L) 22 - 32 mmol/L   Glucose, Bld 124 (H) 70 - 99 mg/dL   BUN 12 6 - 20 mg/dL   Creatinine, Ser 0.69 0.44 - 1.00 mg/dL   Calcium 9.1 8.9 - 10.3 mg/dL   GFR calc non Af Amer >60 >60 mL/min   GFR calc Af Amer >60 >60 mL/min   Anion gap 12 5 - 15  CBC  Result Value Ref Range   WBC 10.3 4.0 - 10.5 K/uL   RBC 4.41 3.87 - 5.11 MIL/uL   Hemoglobin 13.9 12.0 - 15.0 g/dL   HCT 42.4 36.0 - 46.0 %   MCV 96.1 80.0 - 100.0 fL   MCH 31.5 26.0 - 34.0 pg   MCHC  32.8 30.0 - 36.0 g/dL   RDW 14.0 11.5 - 15.5 %   Platelets 388 150 - 400 K/uL   nRBC 0.0 0.0 - 0.2 %  Troponin I - ONCE - **Note De-Identified Donze Obfuscation** STAT  Result Value Ref Range   Troponin I <0.03 <0.03 ng/mL      Assessment & Plan:   Problem List Items Addressed This Visit      Other   Generalized anxiety disorder    Chronic with recent exacerbation from traumatic event. Increase Sertraline to 100 MG, plus continue Vistaril as needed for acute anxiety. Referral for therapy placed.  Return in 4 weeks for follow-up.      Relevant Medications   sertraline (ZOLOFT) 100 MG tablet   Other Relevant Orders   Ambulatory referral to Psychology   Depression, major, single episode, moderate ([place]) - Primary    Chronic, ongoing.  Denies SI/HI.  Will increase Sertraline to 100 MG, this medication has benefited her daughter and sister.  Script sent.  This would benefit both anxiety and depression elements.  Continue Vistaril as needed.  Referral placed for therapy, patient also agrees with would be beneficial.  Obtain CMP today. Follow-up in 4 weeks and adjust dose as needed.      Relevant Medications   sertraline (ZOLOFT) 100 MG tablet   Other Relevant Orders   Ambulatory referral to Psychology   Comprehensive metabolic panel       Follow up plan: Return in about 4 weeks (around 06/23/2019) for is already schedule for physical.

## 2019-05-26 NOTE — Patient Instructions (Signed)
**Note De-identified Hamidi Obfuscation** Living With Depression Everyone experiences occasional disappointment, sadness, and loss in their lives. When you are feeling down, blue, or sad for at least 2 weeks in a row, it may mean that you have depression. Depression can affect your thoughts and feelings, relationships, daily activities, and physical health. It is caused by changes in the way your brain functions. If you receive a diagnosis of depression, your health care provider will tell you which type of depression you have and what treatment options are available to you. If you are living with depression, there are ways to help you recover from it and also ways to prevent it from coming back. How to cope with lifestyle changes Coping with stress     Stress is your body's reaction to life changes and events, both good and bad. Stressful situations may include:  Getting married.  The death of a spouse.  Losing a job.  Retiring.  Having a baby. Stress can last just a few hours or it can be ongoing. Stress can play a major role in depression, so it is important to learn both how to cope with stress and how to think about it differently. Talk with your health care provider or a counselor if you would like to learn more about stress reduction. He or she may suggest some stress reduction techniques, such as:  Music therapy. This can include creating music or listening to music. Choose music that you enjoy and that inspires you.  Mindfulness-based meditation. This kind of meditation can be done while sitting or walking. It involves being aware of your normal breaths, rather than trying to control your breathing.  Centering prayer. This is a kind of meditation that involves focusing on a spiritual word or phrase. Choose a word, phrase, or sacred image that is meaningful to you and that brings you peace.  Deep breathing. To do this, expand your stomach and inhale slowly through your nose. Hold your breath for 3-5 seconds, then exhale  slowly, allowing your stomach muscles to relax.  Muscle relaxation. This involves intentionally tensing muscles then relaxing them. Choose a stress reduction technique that fits your lifestyle and personality. Stress reduction techniques take time and practice to develop. Set aside 5-15 minutes a day to do them. Therapists can offer training in these techniques. The training may be covered by some insurance plans. Other things you can do to manage stress include:  Keeping a stress diary. This can help you learn what triggers your stress and ways to control your response.  Understanding what your limits are and saying no to requests or events that lead to a schedule that is too full.  Thinking about how you respond to certain situations. You may not be able to control everything, but you can control how you react.  Adding humor to your life by watching funny films or TV shows.  Making time for activities that help you relax and not feeling guilty about spending your time this way.  Medicines Your health care provider may suggest certain medicines if he or she feels that they will help improve your condition. Avoid using alcohol and other substances that may prevent your medicines from working properly (may interact). It is also important to:  Talk with your pharmacist or health care provider about all the medicines that you take, their possible side effects, and what medicines are safe to take together.  Make it your goal to take part in all treatment decisions (shared decision-making). This includes giving input on  **Note De-identified Vandevander Obfuscation** the side effects of medicines. It is best if shared decision-making with your health care provider is part of your total treatment plan. If your health care provider prescribes a medicine, you may not notice the full benefits of it for 4-8 weeks. Most people who are treated for depression need to be on medicine for at least 6-12 months after they feel better. If you are taking  medicines as part of your treatment, do not stop taking medicines without first talking to your health care provider. You may need to have the medicine slowly decreased (tapered) over time to decrease the risk of harmful side effects. Relationships Your health care provider may suggest family therapy along with individual therapy and drug therapy. While there may not be family problems that are causing you to feel depressed, it is still important to make sure your family learns as much as they can about your mental health. Having your family's support can help make your treatment successful. How to recognize changes in your condition Everyone has a different response to treatment for depression. Recovery from major depression happens when you have not had signs of major depression for two months. This may mean that you will start to:  Have more interest in doing activities.  Feel less hopeless than you did 2 months ago.  Have more energy.  Overeat less often, or have better or improving appetite.  Have better concentration. Your health care provider will work with you to decide the next steps in your recovery. It is also important to recognize when your condition is getting worse. Watch for these signs:  Having fatigue or low energy.  Eating too much or too little.  Sleeping too much or too little.  Feeling restless, agitated, or hopeless.  Having trouble concentrating or making decisions.  Having unexplained physical complaints.  Feeling irritable, angry, or aggressive. Get help as soon as you or your family members notice these symptoms coming back. How to get support and help from others How to talk with friends and family members about your condition  Talking to friends and family members about your condition can provide you with one way to get support and guidance. Reach out to trusted friends or family members, explain your symptoms to them, and let them know that you are  working with a health care provider to treat your depression. Financial resources Not all insurance plans cover mental health care, so it is important to check with your insurance carrier. If paying for co-pays or counseling services is a problem, search for a local or county mental health care center. They may be able to offer public mental health care services at low or no cost when you are not able to see a private health care provider. If you are taking medicine for depression, you may be able to get the generic form, which may be less expensive. Some makers of prescription medicines also offer help to patients who cannot afford the medicines they need. Follow these instructions at home:   Get the right amount and quality of sleep.  Cut down on using caffeine, tobacco, alcohol, and other potentially harmful substances.  Try to exercise, such as walking or lifting small weights.  Take over-the-counter and prescription medicines only as told by your health care provider.  Eat a healthy diet that includes plenty of vegetables, fruits, whole grains, low-fat dairy products, and lean protein. Do not eat a lot of foods that are high in solid fats, added sugars, or salt.   **Note De-identified Pardue Obfuscation** Keep all follow-up visits as told by your health care provider. This is important. Contact a health care provider if:  You stop taking your antidepressant medicines, and you have any of these symptoms: ? Nausea. ? Headache. ? Feeling lightheaded. ? Chills and body aches. ? Not being able to sleep (insomnia).  You or your friends and family think your depression is getting worse. Get help right away if:  You have thoughts of hurting yourself or others. If you ever feel like you may hurt yourself or others, or have thoughts about taking your own life, get help right away. You can go to your nearest emergency department or call:  Your local emergency services (911 in the [place]  A suicide crisis helpline, such as the  National Suicide Prevention Lifeline at 1-800-273-8255. This is open 24-hours a day. Summary  If you are living with depression, there are ways to help you recover from it and also ways to prevent it from coming back.  Work with your health care team to create a management plan that includes counseling, stress management techniques, and healthy lifestyle habits. This information is not intended to replace advice given to you by your health care provider. Make sure you discuss any questions you have with your health care provider. Document Released: 06/22/2016 Document Revised: 11/11/2018 Document Reviewed: 06/22/2016 Elsevier Patient Education  2020 [employer]  

## 2019-05-26 NOTE — Assessment & Plan Note (Signed)
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 Increase Sertraline to 100 MG, plus continue Vistaril as needed for acute anxiety. Referral for therapy placed.  Return in 4 weeks for follow-up.

## 2019-05-27 LAB — COMPREHENSIVE METABOLIC PANEL
ALT: 36 IU/L — ABNORMAL HIGH (ref 0–32)
AST: 44 IU/L — ABNORMAL HIGH (ref 0–40)
Albumin/Globulin Ratio: 1.8 (ref 1.2–2.2)
Albumin: 4.7 g/dL (ref 3.8–4.9)
Alkaline Phosphatase: 125 IU/L — ABNORMAL HIGH (ref 39–117)
BUN/Creatinine Ratio: 13 (ref 9–23)
BUN: 11 mg/dL (ref 6–24)
Bilirubin Total: 0.9 mg/dL (ref 0.0–1.2)
CO2: 23 mmol/L (ref 20–29)
Calcium: 9.8 mg/dL (ref 8.7–10.2)
Chloride: 103 mmol/L (ref 96–106)
Creatinine, Ser: 0.84 mg/dL (ref 0.57–1.00)
GFR calc Af Amer: 88 mL/min/{1.73_m2} (ref >59)
GFR calc non Af Amer: 76 mL/min/{1.73_m2} (ref >59)
Globulin, Total: 2.6 g/dL (ref 1.5–4.5)
Glucose: 100 mg/dL — ABNORMAL HIGH (ref 65–99)
Potassium: 4.2 mmol/L (ref 3.5–5.2)
Sodium: 140 mmol/L (ref 134–144)
Total Protein: 7.3 g/dL (ref 6.0–8.5)

## 2019-06-13 ENCOUNTER — Other Ambulatory Visit: Payer: Self-pay | Admitting: Nurse Practitioner

## 2019-06-15 ENCOUNTER — Encounter: Payer: 59 | Admitting: Nurse Practitioner

## 2019-06-16 ENCOUNTER — Encounter: Payer: Self-pay | Admitting: Nurse Practitioner

## 2019-07-18 ENCOUNTER — Other Ambulatory Visit: Payer: Self-pay | Admitting: Nurse Practitioner

## 2019-08-28 ENCOUNTER — Other Ambulatory Visit: Payer: Self-pay | Admitting: Nurse Practitioner

## 2019-09-11 ENCOUNTER — Ambulatory Visit: Payer: Self-pay | Admitting: *Deleted

## 2019-09-11 NOTE — Telephone Encounter (Signed)
**Note De-Identified Kundinger Obfuscation** Pt stated stated she has had pain in her neck and upper back that started about 2 or 3 weeks ago.  The pain now goes down her left arm to her index finger. Making it feel numb.. She has taken Tylenol and Ibuprofen for the pain but is not helping. It hurts to try to turn her head from one side to the other.  She denies inability to walk or to use her extremities, no problems with bowel or bladder, no changes in speech, vision or shortness of breath. She stated that she will have a headache and dizziness when the pain goes up her neck. Per protocol, appointment is scheduled and per pt request. She requested an afternoon appointment. Routing to [facility] for review. Advised pt to go to the ED if she has symptoms mentioned above. She voiced understanding.  Reason for Disposition . [1] Age > 71 AND [2] no history of prior similar back pain  Answer Assessment - Initial Assessment Questions 1. SYMPTOM: "What is the main symptom you are concerned about?" (e.g., weakness, numbness)     pain 2. ONSET: "When did this start?" (minutes, hours, days; while sleeping)     About 2 or 3 weeks 3. LAST NORMAL: "When was the last time you were normal (no symptoms)?"     Before then 4. PATTERN "Does this come and go, or has it been constant since it started?"  "Is it present now?"     constant 5. CARDIAC SYMPTOMS: "Have you had any of the following symptoms: chest pain, difficulty breathing, palpitations?"     no 6. NEUROLOGIC SYMPTOMS: "Have you had any of the following symptoms: headache, dizziness, vision loss, double vision, changes in speech, unsteady on your feet?"     Headache, a little dizzy 7. OTHER SYMPTOMS: "Do you have any other symptoms?"     no 8. PREGNANCY: "Is there any chance you are pregnant?" "When was your last menstrual period?"     n/a  Protocols used: BACK PAIN-A-AH, NEUROLOGIC DEFICIT-A-AH

## 2019-09-11 NOTE — Telephone Encounter (Signed)
**Note De-Identified Sarvis Obfuscation** Please see if can get scheduled for afternoon appointment this week with either me or another provider in office.  Thank you.  If worsening before appointment she needs to immediately go to ER.

## 2019-09-12 NOTE — Telephone Encounter (Signed)
**Note De-Identified Yeagley Obfuscation** Spoke with pt and she stated that she is ok to wait until 02/12 but will go to the ER if it gets worse.

## 2019-09-12 NOTE — Telephone Encounter (Signed)
**Note De-identified Deguire Obfuscation** Called pt no answer, vm full

## 2019-09-15 ENCOUNTER — Ambulatory Visit (INDEPENDENT_AMBULATORY_CARE_PROVIDER_SITE_OTHER): Payer: 59 | Admitting: Nurse Practitioner

## 2019-09-15 ENCOUNTER — Other Ambulatory Visit: Payer: Self-pay

## 2019-09-15 ENCOUNTER — Encounter: Payer: Self-pay | Admitting: Nurse Practitioner

## 2019-09-15 VITALS — BP 136/80 | HR 94 | Temp 98.9°F

## 2019-09-15 DIAGNOSIS — M542 Cervicalgia: Secondary | ICD-10-CM

## 2019-09-15 DIAGNOSIS — F321 Major depressive disorder, single episode, moderate: Secondary | ICD-10-CM

## 2019-09-15 DIAGNOSIS — F411 Generalized anxiety disorder: Secondary | ICD-10-CM

## 2019-09-15 MED ORDER — CYCLOBENZAPRINE HCL 10 MG PO TABS: 10.0000 mg | ORAL_TABLET | Freq: Three times a day (TID) | ORAL | 0 refills | Status: DC | PRN

## 2019-09-15 MED ORDER — SERTRALINE HCL 100 MG PO TABS: 150.0000 mg | ORAL_TABLET | Freq: Every day | ORAL | 6 refills | Status: DC

## 2019-09-15 MED ORDER — KETOROLAC TROMETHAMINE 60 MG/2ML IM SOLN: 30.0000 mg | Freq: Once | INTRAMUSCULAR | Status: AC

## 2019-09-15 MED ORDER — PREDNISONE 10 MG PO TABS: ORAL_TABLET | ORAL | 0 refills | Status: DC

## 2019-09-15 NOTE — Assessment & Plan Note (Signed)
**Note De-Identified Schriever Obfuscation** Chronic, ongoing.  Denies SI/HI.  Will increase Sertraline to 150 MG.  Script sent.  This benefits both anxiety and depression elements.  Continue Vistaril as needed.   Follow-up in 4 weeks and adjust dose as needed.

## 2019-09-15 NOTE — Patient Instructions (Signed)
**Note De-identified Tippets Obfuscation** Living With Depression Everyone experiences occasional disappointment, sadness, and loss in their lives. When you are feeling down, blue, or sad for at least 2 weeks in a row, it may mean that you have depression. Depression can affect your thoughts and feelings, relationships, daily activities, and physical health. It is caused by changes in the way your brain functions. If you receive a diagnosis of depression, your health care provider will tell you which type of depression you have and what treatment options are available to you. If you are living with depression, there are ways to help you recover from it and also ways to prevent it from coming back. How to cope with lifestyle changes Coping with stress     Stress is your body's reaction to life changes and events, both good and bad. Stressful situations may include:  Getting married.  The death of a spouse.  Losing a job.  Retiring.  Having a baby. Stress can last just a few hours or it can be ongoing. Stress can play a major role in depression, so it is important to learn both how to cope with stress and how to think about it differently. Talk with your health care provider or a counselor if you would like to learn more about stress reduction. He or she may suggest some stress reduction techniques, such as:  Music therapy. This can include creating music or listening to music. Choose music that you enjoy and that inspires you.  Mindfulness-based meditation. This kind of meditation can be done while sitting or walking. It involves being aware of your normal breaths, rather than trying to control your breathing.  Centering prayer. This is a kind of meditation that involves focusing on a spiritual word or phrase. Choose a word, phrase, or sacred image that is meaningful to you and that brings you peace.  Deep breathing. To do this, expand your stomach and inhale slowly through your nose. Hold your breath for 3-5 seconds, then exhale  slowly, allowing your stomach muscles to relax.  Muscle relaxation. This involves intentionally tensing muscles then relaxing them. Choose a stress reduction technique that fits your lifestyle and personality. Stress reduction techniques take time and practice to develop. Set aside 5-15 minutes a day to do them. Therapists can offer training in these techniques. The training may be covered by some insurance plans. Other things you can do to manage stress include:  Keeping a stress diary. This can help you learn what triggers your stress and ways to control your response.  Understanding what your limits are and saying no to requests or events that lead to a schedule that is too full.  Thinking about how you respond to certain situations. You may not be able to control everything, but you can control how you react.  Adding humor to your life by watching funny films or TV shows.  Making time for activities that help you relax and not feeling guilty about spending your time this way.  Medicines Your health care provider may suggest certain medicines if he or she feels that they will help improve your condition. Avoid using alcohol and other substances that may prevent your medicines from working properly (may interact). It is also important to:  Talk with your pharmacist or health care provider about all the medicines that you take, their possible side effects, and what medicines are safe to take together.  Make it your goal to take part in all treatment decisions (shared decision-making). This includes giving input on  **Note De-identified Kraai Obfuscation** the side effects of medicines. It is best if shared decision-making with your health care provider is part of your total treatment plan. If your health care provider prescribes a medicine, you may not notice the full benefits of it for 4-8 weeks. Most people who are treated for depression need to be on medicine for at least 6-12 months after they feel better. If you are taking  medicines as part of your treatment, do not stop taking medicines without first talking to your health care provider. You may need to have the medicine slowly decreased (tapered) over time to decrease the risk of harmful side effects. Relationships Your health care provider may suggest family therapy along with individual therapy and drug therapy. While there may not be family problems that are causing you to feel depressed, it is still important to make sure your family learns as much as they can about your mental health. Having your family's support can help make your treatment successful. How to recognize changes in your condition Everyone has a different response to treatment for depression. Recovery from major depression happens when you have not had signs of major depression for two months. This may mean that you will start to:  Have more interest in doing activities.  Feel less hopeless than you did 2 months ago.  Have more energy.  Overeat less often, or have better or improving appetite.  Have better concentration. Your health care provider will work with you to decide the next steps in your recovery. It is also important to recognize when your condition is getting worse. Watch for these signs:  Having fatigue or low energy.  Eating too much or too little.  Sleeping too much or too little.  Feeling restless, agitated, or hopeless.  Having trouble concentrating or making decisions.  Having unexplained physical complaints.  Feeling irritable, angry, or aggressive. Get help as soon as you or your family members notice these symptoms coming back. How to get support and help from others How to talk with friends and family members about your condition  Talking to friends and family members about your condition can provide you with one way to get support and guidance. Reach out to trusted friends or family members, explain your symptoms to them, and let them know that you are  working with a health care provider to treat your depression. Financial resources Not all insurance plans cover mental health care, so it is important to check with your insurance carrier. If paying for co-pays or counseling services is a problem, search for a local or county mental health care center. They may be able to offer public mental health care services at low or no cost when you are not able to see a private health care provider. If you are taking medicine for depression, you may be able to get the generic form, which may be less expensive. Some makers of prescription medicines also offer help to patients who cannot afford the medicines they need. Follow these instructions at home:   Get the right amount and quality of sleep.  Cut down on using caffeine, tobacco, alcohol, and other potentially harmful substances.  Try to exercise, such as walking or lifting small weights.  Take over-the-counter and prescription medicines only as told by your health care provider.  Eat a healthy diet that includes plenty of vegetables, fruits, whole grains, low-fat dairy products, and lean protein. Do not eat a lot of foods that are high in solid fats, added sugars, or salt.   **Note De-identified Amparo Obfuscation** Keep all follow-up visits as told by your health care provider. This is important. Contact a health care provider if:  You stop taking your antidepressant medicines, and you have any of these symptoms: ? Nausea. ? Headache. ? Feeling lightheaded. ? Chills and body aches. ? Not being able to sleep (insomnia).  You or your friends and family think your depression is getting worse. Get help right away if:  You have thoughts of hurting yourself or others. If you ever feel like you may hurt yourself or others, or have thoughts about taking your own life, get help right away. You can go to your nearest emergency department or call:  Your local emergency services (911 in the [place]  A suicide crisis helpline, such as the  National Suicide Prevention Lifeline at 1-800-273-8255. This is open 24-hours a day. Summary  If you are living with depression, there are ways to help you recover from it and also ways to prevent it from coming back.  Work with your health care team to create a management plan that includes counseling, stress management techniques, and healthy lifestyle habits. This information is not intended to replace advice given to you by your health care provider. Make sure you discuss any questions you have with your health care provider. Document Revised: 11/11/2018 Document Reviewed: 06/22/2016 Elsevier Patient Education  2020 [employer].  

## 2019-09-15 NOTE — Progress Notes (Signed)
**Note De-Identified Brier Obfuscation** BP 136/80 (BP Location: Left Arm)   Pulse 94   Temp 98.9 F (37.2 C) (Oral)   SpO2 98%    Subjective:    Patient ID: Haley [name], female    DOB: 1961-07-26, 59 y.o.   MRN: [id number]  HPI: Haley [name] is a 59 y.o. female  Chief Complaint  Patient presents with  . Depression    pt states she wants to increase sertraline  . Neck Pain    started 2 to 3 weeks ago, neck pain that moves into upper back in between shoulder blades    DEPRESSION Continues on Sertraline 100 MG daily and Trazodone 50 MG for sleep + takes Vistaril as needed (does not take often, has 1/2 bottle left).  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  Has about 80% good days with mood and 20% bad days with mood, feels needs a little higher dose of Sertraline.  Mood status: uncontrolled Satisfied with current treatment?: yes Symptom severity: moderate  Duration of current treatment : chronic Side effects: no Medication compliance: good compliance Psychotherapy/counseling: none Depressed mood: yes Anxious mood: yes Anhedonia: no Significant weight loss or gain: no Insomnia: yes hard to fall asleep Fatigue: no Feelings of worthlessness or guilt: no Impaired concentration/indecisiveness: yes Suicidal ideations: no Hopelessness: no Crying spells: no Depression screen [facility] 2/9 09/15/2019 05/26/2019 05/01/2019 03/31/2019 03/01/2019  Decreased Interest 2 2 3 2 2   Down, Depressed, Hopeless 1 1 2 2 2   PHQ - 2 Score 3 3 5 4 4   Altered sleeping 3 3 3 3 3   Tired, decreased energy 0 3 3 3 3   Change in appetite 0 3 2 2 2   Feeling bad or failure about yourself  0 0 0 0 0  Trouble concentrating 0 0 2 3 1   Moving slowly or fidgety/restless 0 0 0 0 0  Suicidal thoughts 0 0 0 0 0  PHQ-9 Score 6 12 15 15 13   Difficult doing work/chores Not difficult at all Somewhat difficult Somewhat difficult Somewhat difficult -  Some recent data might be hidden   GAD 7 : Generalized Anxiety Score 05/26/2019  05/01/2019 03/31/2019 03/01/2019  Nervous, Anxious, on Edge 3 3 2 3   Control/stop worrying 2 2 3 3   Worry too much - different things 3 3 3 3   Trouble relaxing 3 3 3 3   Restless 0 2 3 3   Easily annoyed or irritable 2 3 3 2   Afraid - awful might happen 0 1 0 3  Total GAD 7 Score 13 17 17 20   Anxiety Difficulty Somewhat difficult Somewhat difficult Somewhat difficult Very difficult    NECK PAIN  Neck pain started 2-3 weeks ago, radiates down her left arm with numbness to thumb and second finger.  Also radiates into upper back.  Caught her mom during a fall on Monday and this made pain worse.  Denies headache, CP, SOB, or vision changes. Status: worse Treatments attempted: rest, ice, heat and APAP , Advil Relief with NSAIDs?:  moderate Location:Left Duration:weeks Severity: 10/10 Quality: sharp, dull, aching and throbbing Frequency: constant Radiation: L arm Aggravating factors: lifting, movement and bending Alleviating factors: ice, heat, NSAIDs and APAP Weakness:  no Paresthesias / decreased sensation:  yes  Fevers:  no  Relevant past medical, surgical, family and social history reviewed and updated as indicated. Interim medical history since our last visit reviewed. Allergies and medications reviewed and updated.  Review of Systems  Constitutional: Negative for activity **Note De-Identified Levan Obfuscation** change, appetite change, diaphoresis, fatigue and fever.  Respiratory: Negative for cough, chest tightness and shortness of breath.   Cardiovascular: Negative for chest pain, palpitations and leg swelling.  Gastrointestinal: Negative.   Musculoskeletal: Positive for neck pain.  Neurological: Negative.   Psychiatric/Behavioral: Positive for decreased concentration and sleep disturbance. Negative for self-injury and suicidal ideas. The patient is nervous/anxious.     Per HPI unless specifically indicated above     Objective:    BP 136/80 (BP Location: Left Arm)   Pulse 94   Temp 98.9 F (37.2 C) (Oral)    SpO2 98%   Wt Readings from Last 3 Encounters:  03/01/19 115 lb (52.2 kg)  02/10/19 112 lb (50.8 kg)  08/26/18 107 lb 6.4 oz (48.7 kg)    Physical Exam Vitals and nursing note reviewed.  Constitutional:      General: She is awake. She is not in acute distress.    Appearance: She is well-developed. She is not ill-appearing.  HENT:     Head: Normocephalic.     Right Ear: Hearing normal.     Left Ear: Hearing normal.  Eyes:     General: Lids are normal.        Right eye: No discharge.        Left eye: No discharge.     Conjunctiva/sclera: Conjunctivae normal.     Pupils: Pupils are equal, round, and reactive to light.  Neck:     Thyroid: No thyromegaly.     Vascular: No carotid bruit.     Comments: No rashes noted to neck Cardiovascular:     Rate and Rhythm: Normal rate and regular rhythm.     Heart sounds: Normal heart sounds. No murmur. No gallop.   Pulmonary:     Effort: Pulmonary effort is normal. No accessory muscle usage or respiratory distress.     Breath sounds: Normal breath sounds.  Abdominal:     General: Bowel sounds are normal.     Palpations: Abdomen is soft.  Musculoskeletal:     Cervical back: Neck supple. No edema, erythema, rigidity or crepitus. No spinous process tenderness or muscular tenderness. Decreased range of motion.     Right lower leg: No edema.     Left lower leg: No edema.  Skin:    General: Skin is warm and dry.  Neurological:     Mental Status: She is alert and oriented to person, place, and time.  Psychiatric:        Attention and Perception: Attention normal.        Mood and Affect: Mood normal.        Speech: Speech normal.        Behavior: Behavior normal. Behavior is cooperative.    Neck Exam:    Tenderness to Palpation: no -- felt better with palpation    Midline cervical spine: no    Paraspinal neck musculature: no    Trapezius: no    Sternocleidomastoid: no     Range of Motion:     Flexion: Decreased    Extension:  Decreased    Lateral rotation: Decreased    Lateral bending: Decreased     Neuro Examination: Upper extremity DTRs normal & symmetric.  Strength and sensation intact.    C5   Reflex ( Biceps): normal            Muscle (Deltoid and biceps): normal            Sensation (Lateral arm): normal **Note De-Identified Wassmer Obfuscation** C6   Reflex (Brachioradialis): normal            Muscle (Wrist extension and biceps): Within Normal Limits            Sensation (Lateral forearm):Within Normal Limits     C7   Reflex (Triceps): Normal            Muscle ( Wrist flexors, finger extension, triceps): Within Normal Limits            Sensation (Middle finger): Within Normal Limits      C8   Muscle (Finger extension, hand intrinsics): Within Normal Limits            Sensation (Medial forearm):Within Normal Limits       T1   Muscle (Hand intrinsics):Within Normal Limits            Sensation (Medial arm):Within Normal Limits   Results for orders placed or performed in visit on 05/26/19  Comprehensive metabolic panel  Result Value Ref Range   Glucose 100 (H) 65 - 99 mg/dL   BUN 11 6 - 24 mg/dL   Creatinine, Ser 0.84 0.57 - 1.00 mg/dL   GFR calc non Af Amer 76 >59 mL/min/1.73   GFR calc Af Amer 88 >59 mL/min/1.73   BUN/Creatinine Ratio 13 9 - 23   Sodium 140 134 - 144 mmol/L   Potassium 4.2 3.5 - 5.2 mmol/L   Chloride 103 96 - 106 mmol/L   CO2 23 20 - 29 mmol/L   Calcium 9.8 8.7 - 10.2 mg/dL   Total Protein 7.3 6.0 - 8.5 g/dL   Albumin 4.7 3.8 - 4.9 g/dL   Globulin, Total 2.6 1.5 - 4.5 g/dL   Albumin/Globulin Ratio 1.8 1.2 - 2.2   Bilirubin Total 0.9 0.0 - 1.2 mg/dL   Alkaline Phosphatase 125 (H) 39 - 117 IU/L   AST 44 (H) 0 - 40 IU/L   ALT 36 (H) 0 - 32 IU/L      Assessment & Plan:   Problem List Items Addressed This Visit      Other   Generalized anxiety disorder    Chronic, ongoing.  Denies SI/HI.  Will increase Sertraline to 150 MG.  Script sent.  This benefits both anxiety and depression elements.  Continue  Vistaril as needed.   Follow-up in 4 weeks and adjust dose as needed.      Relevant Medications   sertraline (ZOLOFT) 100 MG tablet   Depression, major, single episode, moderate (HCC) - Primary    Chronic with recent exacerbation due to seasonal changes. Increase Sertraline to 150 MG, plus continue Vistaril as needed for acute anxiety. Referral for therapy placed in October, has not attended.  Return in 4 weeks.      Relevant Medications   sertraline (ZOLOFT) 100 MG tablet   Acute neck pain    With radiculopathy, prefers to defer imaging.  Will send script for Prednisone taper and Flexeril PRN.  Toradol in office today, 30 MG.  Recommend continued use of ice and heat as needed + Tylenol and Advil as needed.  Discussed use of OTC lidocaine patches and gels, Voltaren gel.  Rest this weekend, no heavy lifting.  Return to office for worsening or ongoing pain.      Relevant Medications   ketorolac (TORADOL) injection 30 mg       Follow up plan: Return in about 4 weeks (around 10/13/2019) for Mood.

## 2019-09-15 NOTE — Assessment & Plan Note (Signed)
**Note De-Identified Sc[place]ino Obfuscation** Chronic with recent exacerbation due to seasonal changes. Increase Sertraline to 150 MG, plus continue Vistaril as needed for acute anxiety. Referral for therapy placed in October, has not attended.  Return in 4 weeks.

## 2019-09-15 NOTE — Assessment & Plan Note (Signed)
**Note De-Identified Koob Obfuscation** With radiculopathy, prefers to defer imaging.  Will send script for Prednisone taper and Flexeril PRN.  Toradol in office today, 30 MG.  Recommend continued use of ice and heat as needed + Tylenol and Advil as needed.  Discussed use of OTC lidocaine patches and gels, Voltaren gel.  Rest this weekend, no heavy lifting.  Return to office for worsening or ongoing pain.

## 2019-10-06 ENCOUNTER — Encounter: Payer: Self-pay | Admitting: Nurse Practitioner

## 2019-10-06 ENCOUNTER — Other Ambulatory Visit: Payer: Self-pay

## 2019-10-06 ENCOUNTER — Ambulatory Visit (INDEPENDENT_AMBULATORY_CARE_PROVIDER_SITE_OTHER): Payer: 59 | Admitting: Nurse Practitioner

## 2019-10-06 VITALS — BP 132/80 | HR 98 | Temp 99.1°F

## 2019-10-06 DIAGNOSIS — F411 Generalized anxiety disorder: Secondary | ICD-10-CM | POA: Diagnosis not present

## 2019-10-06 DIAGNOSIS — F321 Major depressive disorder, single episode, moderate: Secondary | ICD-10-CM | POA: Diagnosis not present

## 2019-10-06 DIAGNOSIS — F5101 Primary insomnia: Secondary | ICD-10-CM | POA: Diagnosis not present

## 2019-10-06 DIAGNOSIS — M25512 Pain in left shoulder: Secondary | ICD-10-CM | POA: Diagnosis not present

## 2019-10-06 MED ORDER — KETOROLAC TROMETHAMINE 60 MG/2ML IM SOLN
60.0000 mg | Freq: Once | INTRAMUSCULAR | Status: AC
  Administered 2019-10-06: 60 mg via INTRAMUSCULAR

## 2019-10-06 MED ORDER — CYCLOBENZAPRINE HCL 10 MG PO TABS: 10.0000 mg | ORAL_TABLET | Freq: Three times a day (TID) | ORAL | 0 refills | Status: DC | PRN

## 2019-10-06 MED ORDER — TIZANIDINE HCL 4 MG PO TABS: 4.0000 mg | ORAL_TABLET | Freq: Four times a day (QID) | ORAL | 0 refills | Status: DC | PRN

## 2019-10-06 NOTE — Assessment & Plan Note (Addendum)
**Note De-Identified [place] Obfuscation** Chronic, stable with PRN Trazodone.  She reports benefit from this as needed and less grogginess.  Continue this regimen and adjust as needed.

## 2019-10-06 NOTE — Assessment & Plan Note (Signed)
**Note De-Identified Butsch Obfuscation** Chronic, ongoing.  Denies SI/HI.  Will continue Sertraline 150 MG, as mood has improved and scores decreased.  This benefits both anxiety and depression elements.  Continue Vistaril as needed.   Follow-up in 3 months for annual physical.

## 2019-10-06 NOTE — Progress Notes (Signed)
**Note De-Identified Gagliardo Obfuscation** BP 132/80 (BP Location: Left Arm, Patient Position: Sitting)   Pulse 98   Temp 99.1 F (37.3 C) (Oral)   SpO2 96%    Subjective:    Patient ID: Haley [name], female    DOB: 05-18-1961, 59 y.o.   MRN: [id number]  HPI: Haley [name] is a 59 y.o. female  Chief Complaint  Patient presents with  . Depression  . Shoulder Pain   DEPRESSION At last visit in February increased Sertraline to 150 MG + continues Vistaril as needed. Takes Trazodone 50 MG for sleep.  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  Has about 100% good days with mood. Mood status: stable Satisfied with current treatment?: yes Symptom severity: mild  Duration of current treatment : chronic Side effects: no Medication compliance: good compliance Psychotherapy/counseling: none Previous psychiatric medications: Zoloft, Trazodone, Vistaril Depressed mood: no Anxious mood: no Anhedonia: no Significant weight loss or gain: no Insomnia: none Fatigue: no Feelings of worthlessness or guilt: no Impaired concentration/indecisiveness: no Suicidal ideations: no Hopelessness: no Crying spells: no Depression screen [facility] 2/9 10/06/2019 09/15/2019 05/26/2019 05/01/2019 03/31/2019  Decreased Interest 1 2 2 3 2   Down, Depressed, Hopeless 0 1 1 2 2   PHQ - 2 Score 1 3 3 5 4   Altered sleeping 2 3 3 3 3   Tired, decreased energy 1 0 3 3 3   Change in appetite 0 0 3 2 2   Feeling bad or failure about yourself  0 0 0 0 0  Trouble concentrating 0 0 0 2 3  Moving slowly or fidgety/restless 0 0 0 0 0  Suicidal thoughts 0 0 0 0 0  PHQ-9 Score 4 6 12 15 15   Difficult doing work/chores Not difficult at all Not difficult at all Somewhat difficult Somewhat difficult Somewhat difficult  Some recent data might be hidden   SHOULDER PAIN Seen at last visit for neck pain, which continues to be present but is more in her shoulder.  Reports she can not sleep at night because the pain is so bad.  Denies headache, CP, SOB, or  vision changes.  Radiates down her left arm with numbness to thumb. Duration: weeks Involved shoulder: left Mechanism of injury: started after she caught her mom during a fall Location: posterior Onset:gradual Severity: 10/10  Quality: sharp, dull, aching and throbbing Frequency: constant Radiation: L arm Aggravating factors: lifting, movement and bending, lying down at night Alleviating factors: ice, heat, NSAIDs and APAP Paresthesias / decreased sensation:  yes  Weakness: no Numbness: yes Decreased grip strength: no Redness: no Swelling: no Bruising: no Fevers: no  Relevant past medical, surgical, family and social history reviewed and updated as indicated. Interim medical history since our last visit reviewed. Allergies and medications reviewed and updated.  Review of Systems  Constitutional: Negative for activity change, appetite change, diaphoresis, fatigue and fever.  Respiratory: Negative for cough, chest tightness and shortness of breath.   Cardiovascular: Negative for chest pain, palpitations and leg swelling.  Gastrointestinal: Negative.   Neurological: Negative.   Psychiatric/Behavioral: Positive for decreased concentration and sleep disturbance. Negative for self-injury and suicidal ideas. The patient is nervous/anxious.     Per HPI unless specifically indicated above     Objective:    BP 132/80 (BP Location: Left Arm, Patient Position: Sitting)   Pulse 98   Temp 99.1 F (37.3 C) (Oral)   SpO2 96%   Wt Readings from Last 3 Encounters:  03/01/19 115 lb (52.2 kg) **Note De-Identified Girardot Obfuscation** 02/10/19 112 lb (50.8 kg)  08/26/18 107 lb 6.4 oz (48.7 kg)    Physical Exam Vitals and nursing note reviewed.  Constitutional:      General: She is awake. She is not in acute distress.    Appearance: She is well-developed and well-groomed. She is not ill-appearing.  HENT:     Head: Normocephalic.     Right Ear: Hearing normal.     Left Ear: Hearing normal.  Eyes:     General: Lids are  normal.        Right eye: No discharge.        Left eye: No discharge.     Conjunctiva/sclera: Conjunctivae normal.     Pupils: Pupils are equal, round, and reactive to light.  Neck:     Thyroid: No thyromegaly.     Vascular: No carotid bruit.     Comments: No rashes noted Cardiovascular:     Rate and Rhythm: Normal rate and regular rhythm.     Heart sounds: Normal heart sounds. No murmur. No gallop.   Pulmonary:     Effort: Pulmonary effort is normal. No accessory muscle usage or respiratory distress.     Breath sounds: Normal breath sounds.  Abdominal:     General: Bowel sounds are normal.     Palpations: Abdomen is soft.  Musculoskeletal:     Right shoulder: Normal.     Left shoulder: No swelling, deformity, tenderness or bony tenderness. Normal range of motion. Normal strength.     Cervical back: Neck supple. No edema, erythema, signs of trauma, rigidity or crepitus. Pain with movement (to left) present. No spinous process tenderness or muscular tenderness. Decreased range of motion (to left mild).     Right lower leg: No edema.     Left lower leg: No edema.  Skin:    General: Skin is warm and dry.  Neurological:     Mental Status: She is alert and oriented to person, place, and time.  Psychiatric:        Attention and Perception: Attention normal.        Mood and Affect: Mood normal.        Behavior: Behavior normal. Behavior is cooperative.        Thought Content: Thought content normal.        Judgment: Judgment normal.       Shoulder: left    Inspection:  no swelling, ecchymosis, erythema or step off deformity.  Swelling: no none  Ecchymosis: no none  Erythema: none  Deformity: none      Tenderness to Palpation:    Acromion: no    AC joint:no    Clavicle: no    Bicipital groove: no    Scapular spine: no    Coracoid process: no    Humeral head: no    Supraspinatus tendon: no     Range of Motion:  Full Range of Motion bilaterally    Abduction:Normal     Adduction: Normal -- with tenderness    Flexion: Normal    Extension: Normal - with tenderness    Internal rotation: Normal    External rotation: Normal with tenderness    Painful arc: no     Muscle Strength: 5/5 bilaterally    Flexion: Normal8    Extension: Normal    Abduction: Normal    Adduction: Normal    External rotation: Normal    Internal rotation: Normal     Neuro: Sensation WNL. and Upper extremity reflexes WNL. **Note De-Identified Siemon Obfuscation** Special Tests:     Neer sign: Negative    Hawkins sign: Positive    Cross arm adduction: Positive    Yergason sign: Negative    O'brien sign: Negative     Speed sign: Negative  Results for orders placed or performed in visit on 05/26/19  Comprehensive metabolic panel  Result Value Ref Range   Glucose 100 (H) 65 - 99 mg/dL   BUN 11 6 - 24 mg/dL   Creatinine, Ser 0.84 0.57 - 1.00 mg/dL   GFR calc non Af Amer 76 >59 mL/min/1.73   GFR calc Af Amer 88 >59 mL/min/1.73   BUN/Creatinine Ratio 13 9 - 23   Sodium 140 134 - 144 mmol/L   Potassium 4.2 3.5 - 5.2 mmol/L   Chloride 103 96 - 106 mmol/L   CO2 23 20 - 29 mmol/L   Calcium 9.8 8.7 - 10.2 mg/dL   Total Protein 7.3 6.0 - 8.5 g/dL   Albumin 4.7 3.8 - 4.9 g/dL   Globulin, Total 2.6 1.5 - 4.5 g/dL   Albumin/Globulin Ratio 1.8 1.2 - 2.2   Bilirubin Total 0.9 0.0 - 1.2 mg/dL   Alkaline Phosphatase 125 (H) 39 - 117 IU/L   AST 44 (H) 0 - 40 IU/L   ALT 36 (H) 0 - 32 IU/L      Assessment & Plan:   Problem List Items Addressed This Visit      Other   Generalized anxiety disorder    Chronic, ongoing.  Denies SI/HI.  Will continue Sertraline to 150 MG.  This benefits both anxiety and depression elements.  Continue Vistaril as needed.   Return in 3 months for annual physical.      Depression, major, single episode, moderate ([place]) - Primary    Chronic, ongoing.  Denies SI/HI.  Will continue Sertraline 150 MG, as mood has improved and scores decreased.  This benefits both anxiety and depression elements.   Continue Vistaril as needed.   Follow-up in 3 months for annual physical.      Insomnia    Chronic, stable with PRN Trazodone.  She reports benefit from this as needed and less grogginess.  Continue this regimen and adjust as needed.        Acute pain of left shoulder    Ongoing, will obtain imaging neck and shoulder, left.  Will send script for Tizanidine.  Toradol in office today, 60 MG.  Recommend continued use of ice and heat as needed + Tylenol and Advil as needed (minimize Advil).  Discussed use of OTC lidocaine patches and gels, Voltaren gel.  Rest this weekend, no heavy lifting.  Referral to ortho due to ongoing pain.  Return to office for worsening or continued issues.      Relevant Medications   ketorolac (TORADOL) injection 60 mg   Other Relevant Orders   DG Cervical Spine Complete   DG Shoulder Left   Ambulatory referral to Orthopedics       Follow up plan: Return in about 3 months (around 01/06/2020) for Annual physical.

## 2019-10-06 NOTE — Assessment & Plan Note (Signed)
**Note De-Identified Maroney Obfuscation** Chronic, ongoing.  Denies SI/HI.  Will continue Sertraline to 150 MG.  This benefits both anxiety and depression elements.  Continue Vistaril as needed.   Return in 3 months for annual physical.

## 2019-10-06 NOTE — Patient Instructions (Signed)
**Note De-identified Choo Obfuscation** Living With Depression Everyone experiences occasional disappointment, sadness, and loss in their lives. When you are feeling down, blue, or sad for at least 2 weeks in a row, it may mean that you have depression. Depression can affect your thoughts and feelings, relationships, daily activities, and physical health. It is caused by changes in the way your brain functions. If you receive a diagnosis of depression, your health care provider will tell you which type of depression you have and what treatment options are available to you. If you are living with depression, there are ways to help you recover from it and also ways to prevent it from coming back. How to cope with lifestyle changes Coping with stress     Stress is your body's reaction to life changes and events, both good and bad. Stressful situations may include:  Getting married.  The death of a spouse.  Losing a job.  Retiring.  Having a baby. Stress can last just a few hours or it can be ongoing. Stress can play a major role in depression, so it is important to learn both how to cope with stress and how to think about it differently. Talk with your health care provider or a counselor if you would like to learn more about stress reduction. He or she may suggest some stress reduction techniques, such as:  Music therapy. This can include creating music or listening to music. Choose music that you enjoy and that inspires you.  Mindfulness-based meditation. This kind of meditation can be done while sitting or walking. It involves being aware of your normal breaths, rather than trying to control your breathing.  Centering prayer. This is a kind of meditation that involves focusing on a spiritual word or phrase. Choose a word, phrase, or sacred image that is meaningful to you and that brings you peace.  Deep breathing. To do this, expand your stomach and inhale slowly through your nose. Hold your breath for 3-5 seconds, then exhale  slowly, allowing your stomach muscles to relax.  Muscle relaxation. This involves intentionally tensing muscles then relaxing them. Choose a stress reduction technique that fits your lifestyle and personality. Stress reduction techniques take time and practice to develop. Set aside 5-15 minutes a day to do them. Therapists can offer training in these techniques. The training may be covered by some insurance plans. Other things you can do to manage stress include:  Keeping a stress diary. This can help you learn what triggers your stress and ways to control your response.  Understanding what your limits are and saying no to requests or events that lead to a schedule that is too full.  Thinking about how you respond to certain situations. You may not be able to control everything, but you can control how you react.  Adding humor to your life by watching funny films or TV shows.  Making time for activities that help you relax and not feeling guilty about spending your time this way.  Medicines Your health care provider may suggest certain medicines if he or she feels that they will help improve your condition. Avoid using alcohol and other substances that may prevent your medicines from working properly (may interact). It is also important to:  Talk with your pharmacist or health care provider about all the medicines that you take, their possible side effects, and what medicines are safe to take together.  Make it your goal to take part in all treatment decisions (shared decision-making). This includes giving input on  **Note De-identified Tsuchiya Obfuscation** the side effects of medicines. It is best if shared decision-making with your health care provider is part of your total treatment plan. If your health care provider prescribes a medicine, you may not notice the full benefits of it for 4-8 weeks. Most people who are treated for depression need to be on medicine for at least 6-12 months after they feel better. If you are taking  medicines as part of your treatment, do not stop taking medicines without first talking to your health care provider. You may need to have the medicine slowly decreased (tapered) over time to decrease the risk of harmful side effects. Relationships Your health care provider may suggest family therapy along with individual therapy and drug therapy. While there may not be family problems that are causing you to feel depressed, it is still important to make sure your family learns as much as they can about your mental health. Having your family's support can help make your treatment successful. How to recognize changes in your condition Everyone has a different response to treatment for depression. Recovery from major depression happens when you have not had signs of major depression for two months. This may mean that you will start to:  Have more interest in doing activities.  Feel less hopeless than you did 2 months ago.  Have more energy.  Overeat less often, or have better or improving appetite.  Have better concentration. Your health care provider will work with you to decide the next steps in your recovery. It is also important to recognize when your condition is getting worse. Watch for these signs:  Having fatigue or low energy.  Eating too much or too little.  Sleeping too much or too little.  Feeling restless, agitated, or hopeless.  Having trouble concentrating or making decisions.  Having unexplained physical complaints.  Feeling irritable, angry, or aggressive. Get help as soon as you or your family members notice these symptoms coming back. How to get support and help from others How to talk with friends and family members about your condition  Talking to friends and family members about your condition can provide you with one way to get support and guidance. Reach out to trusted friends or family members, explain your symptoms to them, and let them know that you are  working with a health care provider to treat your depression. Financial resources Not all insurance plans cover mental health care, so it is important to check with your insurance carrier. If paying for co-pays or counseling services is a problem, search for a local or county mental health care center. They may be able to offer public mental health care services at low or no cost when you are not able to see a private health care provider. If you are taking medicine for depression, you may be able to get the generic form, which may be less expensive. Some makers of prescription medicines also offer help to patients who cannot afford the medicines they need. Follow these instructions at home:   Get the right amount and quality of sleep.  Cut down on using caffeine, tobacco, alcohol, and other potentially harmful substances.  Try to exercise, such as walking or lifting small weights.  Take over-the-counter and prescription medicines only as told by your health care provider.  Eat a healthy diet that includes plenty of vegetables, fruits, whole grains, low-fat dairy products, and lean protein. Do not eat a lot of foods that are high in solid fats, added sugars, or salt.   **Note De-identified Engelbrecht Obfuscation** Keep all follow-up visits as told by your health care provider. This is important. Contact a health care provider if:  You stop taking your antidepressant medicines, and you have any of these symptoms: ? Nausea. ? Headache. ? Feeling lightheaded. ? Chills and body aches. ? Not being able to sleep (insomnia).  You or your friends and family think your depression is getting worse. Get help right away if:  You have thoughts of hurting yourself or others. If you ever feel like you may hurt yourself or others, or have thoughts about taking your own life, get help right away. You can go to your nearest emergency department or call:  Your local emergency services (911 in the [place]  A suicide crisis helpline, such as the  National Suicide Prevention Lifeline at 1-800-273-8255. This is open 24-hours a day. Summary  If you are living with depression, there are ways to help you recover from it and also ways to prevent it from coming back.  Work with your health care team to create a management plan that includes counseling, stress management techniques, and healthy lifestyle habits. This information is not intended to replace advice given to you by your health care provider. Make sure you discuss any questions you have with your health care provider. Document Revised: 11/11/2018 Document Reviewed: 06/22/2016 Elsevier Patient Education  2020 Else[employer] Inc.  

## 2019-10-06 NOTE — Assessment & Plan Note (Addendum)
**Note De-Identified Kennard Obfuscation** Ongoing, will obtain imaging neck and shoulder, left.  Will send script for Tizanidine.  Toradol in office today, 60 MG.  Recommend continued use of ice and heat as needed + Tylenol and Advil as needed (minimize Advil).  Discussed use of OTC lidocaine patches and gels, Voltaren gel.  Rest this weekend, no heavy lifting.  Referral to ortho due to ongoing pain.  Return to office for worsening or continued issues.

## 2019-10-13 ENCOUNTER — Ambulatory Visit: Payer: 59 | Admitting: Nurse Practitioner

## 2019-12-20 ENCOUNTER — Other Ambulatory Visit: Payer: Self-pay | Admitting: Nurse Practitioner

## 2019-12-20 NOTE — Telephone Encounter (Signed)
**Note De-Identified Schoonmaker Obfuscation** Requested Prescriptions  Pending Prescriptions Disp Refills  . traZODone (DESYREL) 50 MG tablet [Pharmacy Med Name: traZODone HCl 50 MG Oral Tablet] 90 tablet 0    Sig: TAKE 1 TABLET BY MOUTH AT BEDTIME     Psychiatry: Antidepressants - Serotonin Modulator Passed - 12/20/2019  5:31 AM      Passed - Completed PHQ-2 or PHQ-9 in the last 360 days.      Passed - Valid encounter within last 6 months    Recent Outpatient Visits          2 months ago Depression, major, single episode, moderate ([place])   [place], Jolene T, NP   3 months ago Depression, major, single episode, moderate ([place])   [place], Jolene T, NP   6 months ago Depression, major, single episode, moderate ([facility])   [place], Jolene T, NP   7 months ago Depression, major, single episode, moderate ([place])   [place], Jolene T, NP   8 months ago Depression, major, single episode, moderate ([place])   [place], Barbaraann Faster, NP      Future Appointments            In 2 weeks Cann[place], Barbaraann Faster, NP [facility]

## 2020-01-05 ENCOUNTER — Other Ambulatory Visit: Payer: Self-pay | Admitting: Nurse Practitioner

## 2020-01-08 ENCOUNTER — Ambulatory Visit (INDEPENDENT_AMBULATORY_CARE_PROVIDER_SITE_OTHER): Payer: 59 | Admitting: Nurse Practitioner

## 2020-01-08 ENCOUNTER — Other Ambulatory Visit: Payer: Self-pay

## 2020-01-08 ENCOUNTER — Other Ambulatory Visit (HOSPITAL_COMMUNITY)
Admission: RE | Admit: 2020-01-08 | Discharge: 2020-01-08 | Disposition: A | Discharge status: Home / Self Care | Payer: 59 | Source: Ambulatory Visit | Attending: Nurse Practitioner | Admitting: Nurse Practitioner

## 2020-01-08 ENCOUNTER — Encounter: Payer: Self-pay | Admitting: Nurse Practitioner

## 2020-01-08 VITALS — BP 127/79 | HR 84 | Temp 98.4°F | Ht 59.0 in | Wt 104.2 lb

## 2020-01-08 DIAGNOSIS — E559 Vitamin D deficiency, unspecified: Secondary | ICD-10-CM

## 2020-01-08 DIAGNOSIS — Z0000 Encounter for general adult medical examination without abnormal findings: Secondary | ICD-10-CM

## 2020-01-08 DIAGNOSIS — Z124 Encounter for screening for malignant neoplasm of cervix: Secondary | ICD-10-CM

## 2020-01-08 DIAGNOSIS — F17210 Nicotine dependence, cigarettes, uncomplicated: Secondary | ICD-10-CM

## 2020-01-08 DIAGNOSIS — F411 Generalized anxiety disorder: Secondary | ICD-10-CM | POA: Diagnosis not present

## 2020-01-08 DIAGNOSIS — F5101 Primary insomnia: Secondary | ICD-10-CM | POA: Diagnosis not present

## 2020-01-08 DIAGNOSIS — Z13220 Encounter for screening for lipoid disorders: Secondary | ICD-10-CM

## 2020-01-08 DIAGNOSIS — Z1231 Encounter for screening mammogram for malignant neoplasm of breast: Secondary | ICD-10-CM | POA: Diagnosis not present

## 2020-01-08 DIAGNOSIS — F321 Major depressive disorder, single episode, moderate: Secondary | ICD-10-CM | POA: Diagnosis not present

## 2020-01-08 DIAGNOSIS — J449 Chronic obstructive pulmonary disease, unspecified: Secondary | ICD-10-CM | POA: Diagnosis not present

## 2020-01-08 DIAGNOSIS — K219 Gastro-esophageal reflux disease without esophagitis: Secondary | ICD-10-CM

## 2020-01-08 DIAGNOSIS — R1032 Left lower quadrant pain: Secondary | ICD-10-CM | POA: Insufficient documentation

## 2020-01-08 MED ORDER — AMOXICILLIN-POT CLAVULANATE 875-125 MG PO TABS: 1.0000 | ORAL_TABLET | Freq: Two times a day (BID) | ORAL | 0 refills | Status: AC

## 2020-01-08 MED ORDER — OMEPRAZOLE 20 MG PO CPDR: 20.0000 mg | DELAYED_RELEASE_CAPSULE | Freq: Two times a day (BID) | ORAL | 3 refills | Status: DC

## 2020-01-08 MED ORDER — ALBUTEROL SULFATE HFA 108 (90 BASE) MCG/ACT IN AERS: 2.0000 | INHALATION_SPRAY | Freq: Four times a day (QID) | RESPIRATORY_TRACT | 5 refills | Status: DC | PRN

## 2020-01-08 NOTE — Patient Instructions (Signed)
**Note De-identified Lapre Obfuscation** Diverticulitis  Diverticulitis is when small pockets in your large intestine (colon) get infected or swollen. This causes stomach pain and watery poop (diarrhea). These pouches are called diverticula. They form in people who have a condition called diverticulosis. Follow these instructions at home: Medicines  Take over-the-counter and prescription medicines only as told by your doctor. These include: ? Antibiotics. ? Pain medicines. ? Fiber pills. ? Probiotics. ? Stool softeners.  Do not drive or use heavy machinery while taking prescription pain medicine.  If you were prescribed an antibiotic, take it as told. Do not stop taking it even if you feel better. General instructions   Follow a diet as told by your doctor.  When you feel better, your doctor may tell you to change your diet. You may need to eat a lot of fiber. Fiber makes it easier to poop (have bowel movements). Healthy foods with fiber include: ? Berries. ? Beans. ? Lentils. ? Green vegetables.  Exercise 3 or more times a week. Aim for 30 minutes each time. Exercise enough to sweat and make your heart beat faster.  Keep all follow-up visits as told. This is important. You may need to have an exam of the large intestine. This is called a colonoscopy. Contact a doctor if:  Your pain does not get better.  You have a hard time eating or drinking.  You are not pooping like normal. Get help right away if:  Your pain gets worse.  Your problems do not get better.  Your problems get worse very fast.  You have a fever.  You throw up (vomit) more than one time.  You have poop that is: ? Bloody. ? Black. ? Tarry. Summary  Diverticulitis is when small pockets in your large intestine (colon) get infected or swollen.  Take medicines only as told by your doctor.  Follow a diet as told by your doctor. This information is not intended to replace advice given to you by your health care provider. Make sure you  discuss any questions you have with your health care provider. Document Revised: 07/02/2017 Document Reviewed: 08/06/2016 Elsevier Patient Education  2020 Elsevier Inc.  

## 2020-01-08 NOTE — Assessment & Plan Note (Signed)
**Note De-Identified Buczkowski Obfuscation** Chronic, ongoing.  Denies SI/HI.  Will continue Sertraline 150 MG daily + Vistaril as needed (change to Buspar in future).  This benefits both anxiety and depression elements. Return to office in 6 months.

## 2020-01-08 NOTE — Assessment & Plan Note (Signed)
**Note De-Identified [place] Obfuscation** Chronic, ongoing.  Denies SI/HI.  Will continue Sertraline 150 MG, as mood has improved and scores decreased.  This benefits both anxiety and depression elements.  Continue Vistaril as needed, consider change to Buspar in future.   Follow-up in 6 months.

## 2020-01-08 NOTE — Assessment & Plan Note (Signed)
**Note De-identified Range Obfuscation** Chronic, stable with PRN Trazodone.  She reports benefit from this as needed and less grogginess.  Continue this regimen and adjust as needed.   

## 2020-01-08 NOTE — Assessment & Plan Note (Signed)
**Note De-identified Prunty Obfuscation** Chronic, ongoing.  Continue daily supplement and recheck Vit D level today.  Adjust as needed.  Next DEXA at age 59. 

## 2020-01-08 NOTE — Assessment & Plan Note (Signed)
**Note De-Identified Suski Obfuscation** Chronic, ongoing.  Has been taking Prilosec often 20 MG BID, will change prescription to this.  Recommend heavy focus on diet and keeping food journal, eliminating foods that cause discomfort.  Mag level next visit.

## 2020-01-08 NOTE — Assessment & Plan Note (Signed)
**Note De-identified Spear Obfuscation** I have recommended complete cessation of tobacco use. I have discussed various options available for assistance with tobacco cessation including over the counter methods (Nicotine gum, patch and lozenges). We also discussed prescription options (Chantix, Nicotine Inhaler / Nasal Spray). The patient is not interested in pursuing any prescription tobacco cessation options at this time.  Referral for lung screening placed.  

## 2020-01-08 NOTE — Assessment & Plan Note (Signed)
**Note De-Identified Mikulski Obfuscation** Acute with waxing and waning.  Is tender to LLQ and has significant history of diverticulitis and abscess issues.  Obtain CBC and CMP.  Has poor diet adherence, recommend heavy focus on reducing foods that cause flares.  Due to past history will treat at this time with Augmentin, script sent.  Return in 10 days for follow-up, sooner if worsening issues.

## 2020-01-08 NOTE — Assessment & Plan Note (Signed)
**Note De-Identified [place] Obfuscation** Chronic, stable. Continue Albuterol inhaler PRN, script sent, and Singulair daily.  Adjust inhaler regimen as needed, consider maintenance if worsening.  Consider spirometry at next visit.  No recent exacerbations.

## 2020-01-08 NOTE — Progress Notes (Signed)
**Note De-Identified [place] [facility]bfuscation** BP 127/79 (BP Location: Left Arm, Patient Position: Sitting, Cuff Size: Normal)   Pulse 84   Temp 98.4 F (36.9 C) (Oral)   Ht 4\' 11"  (1.499 m)   Wt 104 lb 3.2 oz (47.3 kg)   BMI 21.05 kg/m    Subjective:    Patient ID: Haley [name], female    DOB: 1961-02-07, 59 y.o.   MRN: [id number]  HPI: Haley [name] is a 59 y.o. female presenting on 01/08/2020 for comprehensive medical examination. Current medical complaints include: LLQ pain  She currently lives with: mother Menopausal Symptoms: no   COPD Continues on Albuterol as needed. Continues to smoke 1 PPD, does endorse needing to quit but not at this time.  Has quit cold [place] in past, but this caused irritability.  She started smoking at age 49.  She is interested in annual lung CT scan, has not had as of yet. COPD status: stable Satisfied with current treatment?: yes Oxygen use: no Dyspnea frequency: very rare Cough frequency: none Rescue inhaler frequency:  rarely Limitation of activity: no Productive cough:  Last Spirometry:  Pneumovax: Up to Date Influenza: Up to Date   GERD Continues on Prilosec 20 MG daily, she feels she needs to take this twice a day and is doing so at home with benefit. GERD control status: stable  Satisfied with current treatment? yes Heartburn frequency: in evening if no second dose Medication side effects: no  Medication compliance: fluctuating Previous GERD medications: none Antacid use frequency:  Mylanta Dysphagia: no Odynophagia:  no Hematemesis: no Blood in stool: no EGD: yes   ABDOMINAL PAIN  Reports waxing and waning pain to LLQ -- reports feeling like when she had diverticulitis years ago.  Had colonoscopy in 2017 noting diverticulosis of sigmoid colon.  Saw Dr. Bary Castilla a few months ago and was told to take Miralax, which she reports has helped when she takes consistently, but when stops pain returns.  She does endorse poor diet choices -- tomatoes, squash, strawberries.    Has  regular BM every day.  Had bout of diarrhea this morning.  Has history of surgery for this in February 2019.  Then in March 2019 had laparoscopic surgery due to abscess. Duration:waxing and waning Onset: gradual Severity: 5/10 Quality: dull, aching, tearing and throbbing Location:  LLQ  Episode duration:  Radiation: no Frequency: intermittent Alleviating factors:  Aggravating factors: Status: stable Treatments attempted: Miralax Fever: no Nausea: yes Vomiting: no Weight loss: no Decreased appetite: no Diarrhea: yes Constipation: no Blood in stool: no Heartburn: no Jaundice: no Rash: no Dysuria/urinary frequency: no Hematuria: no History of sexually transmitted disease: no Recurrent NSAID use: no   DEPRESSION Continues on Sertraline 150 MG + continues Vistaril as needed. Takes Trazodone 50 MG for sleep.  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 Has about 100% good days with mood.  Has history of Vitamin D deficiency, not recent level on chart.  Continues supplement. Mood status: stable Satisfied with current treatment?: yes Symptom severity: mild  Duration of current treatment : chronic Side effects: no Medication compliance: good compliance Psychotherapy/counseling: none Previous psychiatric medications: Zoloft, Trazodone, Vistaril Depressed mood: no Anxious mood: no Anhedonia: no Significant weight loss or gain: no Insomnia: none Fatigue: no Feelings of worthlessness or guilt: no Impaired concentration/indecisiveness: no Suicidal ideations: no Hopelessness: no Crying spells: no Depression screen [facility] 2/9 01/08/2020 10/06/2019 09/15/2019 05/26/2019 05/01/2019  Decreased Interest 1 1 2 2 3   Down, Depressed, Hopeless 0 **Note De-Identified Demetrius Obfuscation** 0 1 1 2   PHQ - 2 Score 1 1 3 3 5   Altered sleeping 1 2 3 3 3   Tired, decreased energy 3 1 0 3 3  Change in appetite 0 0 0 3 2  Feeling bad or failure about yourself  0 0 0 0 0  Trouble concentrating 0 0 0 0 2  Moving slowly or  fidgety/restless 0 0 0 0 0  Suicidal thoughts 0 0 0 0 0  PHQ-9 Score 5 4 6 12 15   Difficult doing work/chores Not difficult at all Not difficult at all Not difficult at all Somewhat difficult Somewhat difficult  Some recent data might be hidden    The patient does not have a history of falls. I did not complete a risk assessment for falls. A plan of care for falls was not documented.   Past Medical History:  Past Medical History:  Diagnosis Date  . Abdominal pain 04/29/2015  . Acute diverticulitis of intestine 03/18/2017  . Acute pancreatitis 01/13/2017  . Acute sinusitis 09/01/2015  . Anemia   . Anxiety   . Benign neoplasm of sigmoid colon   . Breast cancer screening 07/08/2015  . CHF (congestive heart failure) ([place])    while hospitalized with pneumonia  . Chronic pain syndrome   . COPD (chronic obstructive pulmonary disease) ([place])    while hospitalized  . Cyclic vomiting syndrome   . Cyclical vomiting   . Diverticulitis of large intestine with abscess without bleeding   . Dysphagia   . GERD (gastroesophageal reflux disease)    controlled  . Hematuria, microscopic 04/26/2015   Urine Sept 2016; rechecked still present; refer to urologist; hx of "polyps" in bladder 25 years ago (1991-ish)   . History of pneumonia   . History of sepsis   . Hypertension    controlled on meds  . Hypokalemia 04/24/2015  . IBS (irritable bowel syndrome) 02/02/2017  . Leukocytosis   . Low serum potassium 01/13/2017  . Marital stress 07/12/2015   Separated from husband Fall 2016   . Moderate COPD (chronic obstructive pulmonary disease) ([place])   . Osteopenia Nov 2015  . Pain due to dental caries 05/09/2015  . Pneumonia 4 years ago  . Recurrent depressive disorder, current episode moderate ([place]) 04/24/2015  . Severe depression ([place]) 05/12/2016  . Special screening for malignant neoplasms, colon   . Tachycardia   . Thrombocytosis ([place])   . Vitamin D deficiency disease     Surgical History:  Past  Surgical History:  Procedure Laterality Date  . CESAREAN SECTION  09/27/79, 08/29/83   x 2  . COLON RESECTION N/A 10/08/2017   Procedure: LAPAROSCOPIC SIGMOID COLON RESECTION Converted to Open Sigmoid Colectomy;  Surgeon: [name], MD;  Location: [facility];  Service: General;  Laterality: N/A;  . COLONOSCOPY WITH PROPOFOL N/A 01/09/2016   Procedure: COLONOSCOPY WITH PROPOFOL;  Surgeon: Lucilla Lame, MD;  Location: [place];  Service: Endoscopy;  Laterality: N/A;  . POLYPECTOMY  01/09/2016   Procedure: POLYPECTOMY;  Surgeon: Lucilla Lame, MD;  Location: [place];  Service: Endoscopy;;    Medications:  Current Outpatient Medications on File Prior to Visit  Medication Sig  . fluticasone (FLONASE) 50 MCG/ACT nasal spray Place 2 sprays into both nostrils daily.  . hydrOXYzine (VISTARIL) 25 MG capsule TAKE 1 CAPSULE BY MOUTH THREE TIMES DAILY AS NEEDED FOR ANXIETY  . montelukast (SINGULAIR) 10 MG tablet Take 1 tablet (10 mg total) by mouth at bedtime.  Marland Kitchen **Note De-Identified Sollars Obfuscation** sertraline (ZOLOFT) 100 MG tablet Take 1.5 tablets (150 mg total) by mouth daily.  Marland Kitchen tiZANidine (ZANAFLEX) 4 MG tablet Take 1 tablet (4 mg total) by mouth every 6 (six) hours as needed for muscle spasms.  . traZODone (DESYREL) 50 MG tablet TAKE 1 TABLET BY MOUTH AT BEDTIME   No current facility-administered medications on file prior to visit.    Allergies:  Allergies  Allergen Reactions  . Doxycycline Nausea And Vomiting  . Sulfur Diarrhea and Nausea And Vomiting    Social History:  Social History   Socioeconomic History  . Marital status: Married    Spouse name: Not on file  . Number of children: Not on file  . Years of education: Not on file  . Highest education level: Not on file  Occupational History  . Not on file  Tobacco Use  . Smoking status: Current Every Day Smoker    Packs/day: 1.00    Years: 35.00    Pack years: 35.00    Types: Cigarettes    Last attempt to quit: 08/03/2013    Years since  quitting: 6.4  . Smokeless tobacco: Never Used  Substance and Sexual Activity  . Alcohol use: No    Comment: occasional  . Drug use: No  . Sexual activity: Not Currently  Other Topics Concern  . Not on file  Social History Narrative   Separated from husband Fall 2016   Social Determinants of Health   Financial Resource Strain:   . Difficulty of Paying Living Expenses:   Food Insecurity:   . Worried About Charity fundraiser in the Last Year:   . Arboriculturist in the Last Year:   Transportation Needs:   . Film/video editor (Medical):   Marland Kitchen Lack of Transportation (Non-Medical):   Physical Activity:   . Days of Exercise per Week:   . Minutes of Exercise per Session:   Stress:   . Feeling of Stress :   Social Connections:   . Frequency of Communication with Friends and Family:   . Frequency of Social Gatherings with Friends and Family:   . Attends Religious Services:   . Active Member of Clubs or Organizations:   . Attends Archivist Meetings:   Marland Kitchen Marital Status:   Intimate Partner Violence:   . Fear of Current or Ex-Partner:   . Emotionally Abused:   Marland Kitchen Physically Abused:   . Sexually Abused:    Social History   Tobacco Use  Smoking Status Current Every Day Smoker  . Packs/day: 1.00  . Years: 35.00  . Pack years: 35.00  . Types: Cigarettes  . Last attempt to quit: 08/03/2013  . Years since quitting: 6.4  Smokeless Tobacco Never Used   Social History   Substance and Sexual Activity  Alcohol Use No   Comment: occasional    Family History:  Family History  Problem Relation Age of Onset  . Cancer Father        prostate  . Heart disease Father   . Heart attack Father   . Hypertension Father   . Stroke Neg Hx   . COPD Neg Hx     Past medical history, surgical history, medications, allergies, family history and social history reviewed with patient today and changes made to appropriate areas of the chart.   Review of Systems - negative All  other ROS negative except what is listed above and in the HPI.      Objective: **Note De-Identified [place] Obfuscation** BP 127/79 (BP Location: Left Arm, Patient Position: Sitting, Cuff Size: Normal)   Pulse 84   Temp 98.4 F (36.9 C) (Oral)   Ht 4\' 11"  (1.499 m)   Wt 104 lb 3.2 oz (47.3 kg)   BMI 21.05 kg/m   Wt Readings from Last 3 Encounters:  01/08/20 104 lb 3.2 oz (47.3 kg)  03/01/19 115 lb (52.2 kg)  02/10/19 112 lb (50.8 kg)    Physical Exam Constitutional:      General: She is awake. She is not in acute distress.    Appearance: She is well-developed. She is not ill-appearing.  HENT:     Head: Normocephalic and atraumatic.     Right Ear: Hearing, tympanic membrane, ear canal and external ear normal. No drainage.     Left Ear: Hearing, tympanic membrane, ear canal and external ear normal. No drainage.     Nose: Nose normal.     Right Sinus: No maxillary sinus tenderness or frontal sinus tenderness.     Left Sinus: No maxillary sinus tenderness or frontal sinus tenderness.     Mouth/Throat:     Mouth: Mucous membranes are moist.     Pharynx: Oropharynx is clear. Uvula midline. No pharyngeal swelling, oropharyngeal exudate or posterior oropharyngeal erythema.  Eyes:     General: Lids are normal.        Right eye: No discharge.        Left eye: No discharge.     Extraocular Movements: Extraocular movements intact.     Conjunctiva/sclera: Conjunctivae normal.     Pupils: Pupils are equal, round, and reactive to light.     Visual Fields: Right eye visual fields normal and left eye visual fields normal.  Neck:     Thyroid: No thyromegaly.     Vascular: No carotid bruit.     Trachea: Trachea normal.  Cardiovascular:     Rate and Rhythm: Normal rate and regular rhythm.     Heart sounds: Normal heart sounds. No murmur. No gallop.   Pulmonary:     Effort: Pulmonary effort is normal. No accessory muscle usage or respiratory distress.     Breath sounds: Normal breath sounds.  Chest:     Breasts:         Right: Normal.        Left: Normal.  Abdominal:     General: Bowel sounds are normal. There is no distension or abdominal bruit.     Palpations: Abdomen is soft. There is no hepatomegaly or splenomegaly.     Tenderness: There is abdominal tenderness in the left lower quadrant. There is no right CVA tenderness, left CVA tenderness, guarding or rebound.     Hernia: No hernia is present. There is no hernia in the left inguinal area or right inguinal area.  Genitourinary:    Exam position: Lithotomy position.     Labia:        Right: No rash.        Left: No rash.      Vagina: Normal.     Cervix: Normal.     Uterus: Normal.      Adnexa: Right adnexa normal and left adnexa normal.     Rectum: Normal.     Comments: Cervix anterior, pap obtained.  Declined chaperone. Musculoskeletal:        General: Normal range of motion.     Cervical back: Normal range of motion and neck supple.     Right lower leg: No edema. **Note De-Identified [place] Obfuscation** Left lower leg: No edema.  Lymphadenopathy:     Head:     Right side of head: No submental, submandibular, tonsillar, preauricular or posterior auricular adenopathy.     Left side of head: No submental, submandibular, tonsillar, preauricular or posterior auricular adenopathy.     Cervical: No cervical adenopathy.     Upper Body:     Right upper body: No supraclavicular, axillary or pectoral adenopathy.     Left upper body: No supraclavicular, axillary or pectoral adenopathy.  Skin:    General: Skin is warm and dry.     Capillary Refill: Capillary refill takes less than 2 seconds.     Findings: No rash.  Neurological:     Mental Status: She is alert and oriented to person, place, and time.     Cranial Nerves: Cranial nerves are intact.     Gait: Gait is intact.     Deep Tendon Reflexes: Reflexes are normal and symmetric.     Reflex Scores:      Brachioradialis reflexes are 2+ on the right side and 2+ on the left side.      Patellar reflexes are 2+ on the right side and 2+  on the left side. Psychiatric:        Attention and Perception: Attention normal.        Mood and Affect: Mood normal.        Speech: Speech normal.        Behavior: Behavior normal. Behavior is cooperative.        Thought Content: Thought content normal.        Judgment: Judgment normal.    Results for orders placed or performed in visit on 05/26/19  Comprehensive metabolic panel  Result Value Ref Range   Glucose 100 (H) 65 - 99 mg/dL   BUN 11 6 - 24 mg/dL   Creatinine, Ser 0.84 0.57 - 1.00 mg/dL   GFR calc non Af Amer 76 >59 mL/min/1.73   GFR calc Af Amer 88 >59 mL/min/1.73   BUN/Creatinine Ratio 13 9 - 23   Sodium 140 134 - 144 mmol/L   Potassium 4.2 3.5 - 5.2 mmol/L   Chloride 103 96 - 106 mmol/L   CO2 23 20 - 29 mmol/L   Calcium 9.8 8.7 - 10.2 mg/dL   Total Protein 7.3 6.0 - 8.5 g/dL   Albumin 4.7 3.8 - 4.9 g/dL   Globulin, Total 2.6 1.5 - 4.5 g/dL   Albumin/Globulin Ratio 1.8 1.2 - 2.2   Bilirubin Total 0.9 0.0 - 1.2 mg/dL   Alkaline Phosphatase 125 (H) 39 - 117 IU/L   AST 44 (H) 0 - 40 IU/L   ALT 36 (H) 0 - 32 IU/L      Assessment & Plan:   Problem List Items Addressed This Visit      Respiratory   Moderate COPD (chronic obstructive pulmonary disease) (HCC)    Chronic, stable. Continue Albuterol inhaler PRN, script sent, and Singulair daily.  Adjust inhaler regimen as needed, consider maintenance if worsening.  Consider spirometry at next visit.  No recent exacerbations.      Relevant Medications   albuterol (VENTOLIN HFA) 108 (90 Base) MCG/ACT inhaler     Digestive   GERD (gastroesophageal reflux disease)    Chronic, ongoing.  Has been taking Prilosec often 20 MG BID, will change prescription to this.  Recommend heavy focus on diet and keeping food journal, eliminating foods that cause discomfort.  Mag level next visit. **Note De-Identified [place] Obfuscation** Relevant Medications   omeprazole (PRILOSEC) 20 MG capsule     Other   Vitamin D deficiency disease    Chronic, ongoing.   Continue daily supplement and recheck Vit D level today.  Adjust as needed.  Next DEXA at age 80.      Relevant Orders   VITAMIN D 25 Hydroxy (Vit-D Deficiency, Fractures)   Generalized anxiety disorder    Chronic, ongoing.  Denies SI/HI.  Will continue Sertraline 150 MG daily + Vistaril as needed (change to Buspar in future).  This benefits both anxiety and depression elements. Return to office in 6 months.      Depression, major, single episode, moderate (HCC)    Chronic, ongoing.  Denies SI/HI.  Will continue Sertraline 150 MG, as mood has improved and scores decreased.  This benefits both anxiety and depression elements.  Continue Vistaril as needed, consider change to Buspar in future.   Follow-up in 6 months.      Insomnia    Chronic, stable with PRN Trazodone.  She reports benefit from this as needed and less grogginess.  Continue this regimen and adjust as needed.        Left lower quadrant abdominal pain    Acute with waxing and waning.  Is tender to LLQ and has significant history of diverticulitis and abscess issues.  Obtain CBC and CMP.  Has poor diet adherence, recommend heavy focus on reducing foods that cause flares.  Due to past history will treat at this time with Augmentin, script sent.  Return in 10 days for follow-up, sooner if worsening issues.        Relevant Orders   CBC with Differential/Platelet   Comprehensive metabolic panel   TSH   Cigarette nicotine dependence without complication    I have recommended complete cessation of tobacco use. I have discussed various options available for assistance with tobacco cessation including over the counter methods (Nicotine gum, patch and lozenges). We also discussed prescription options (Chantix, Nicotine Inhaler / Nasal Spray). The patient is not interested in pursuing any prescription tobacco cessation options at this time.  Referral for lung screening placed.       Relevant Orders   Ambulatory Referral for Lung  Cancer Scre    Other Visit Diagnoses    Encounter for annual physical exam    -  Primary   Relevant Orders   Lipid Panel w/o Chol/HDL Ratio   Cervical cancer screening       Pap performed   Relevant Orders   Cytology - PAP   Screening cholesterol level       Lipid panel today   Relevant Orders   Lipid Panel w/o Chol/HDL Ratio   Encounter for screening mammogram for malignant neoplasm of breast       Mammogram ordered   Relevant Orders   MM DIGITAL SCREENING BILATERAL       Follow up plan: Return in about 10 days (around 01/18/2020) for Abdominal pain.   LABORATORY TESTING:  - Pap smear: pap done  IMMUNIZATIONS:   - Tdap: Tetanus vaccination status reviewed: last tetanus booster within 10 years. - Influenza: Up to date - Pneumovax: Refused - Prevnar: Refused - HPV: Not applicable - Zostavax vaccine: Refused  SCREENING: -Mammogram: Ordered today  - Colonoscopy: Up to date  - Bone Density: Not applicable  -Hearing Test: Not applicable  -Spirometry: Not applicable   PATIENT COUNSELING:   Advised to take 1 mg of folate supplement per day if capable of pregnancy. **Note De-Identified [place] Obfuscation** Sexuality: Discussed sexually transmitted diseases, partner selection, use of condoms, avoidance of unintended pregnancy  and contraceptive alternatives.   Advised to avoid cigarette smoking.  I discussed with the patient that most people either abstain from alcohol or drink within safe limits (<=14/week and <=4 drinks/occasion for males, <=7/weeks and <= 3 drinks/occasion for females) and that the risk for alcohol disorders and other health effects rises proportionally with the number of drinks per week and how often a drinker exceeds daily limits.  Discussed cessation/primary prevention of drug use and availability of treatment for abuse.   Diet: Encouraged to adjust caloric intake to maintain  or achieve ideal body weight, to reduce intake of dietary saturated fat and total fat, to limit sodium intake by  avoiding high sodium foods and not adding table salt, and to maintain adequate dietary potassium and calcium preferably from fresh fruits, vegetables, and low-fat dairy products.    stressed the importance of regular exercise  Injury prevention: Discussed safety belts, safety helmets, smoke detector, smoking near bedding or upholstery.   Dental health: Discussed importance of regular tooth brushing, flossing, and dental visits.    NEXT PREVENTATIVE PHYSICAL DUE IN 1 YEAR. Return in about 10 days (around 01/18/2020) for Abdominal pain.

## 2020-01-09 ENCOUNTER — Telehealth: Payer: Self-pay

## 2020-01-09 LAB — CBC WITH DIFFERENTIAL/PLATELET
Basophils Absolute: 0.1 10*3/uL (ref 0.0–0.2)
Basos: 1 %
EOS (ABSOLUTE): 0.1 10*3/uL (ref 0.0–0.4)
Eos: 1 %
Hematocrit: 48.1 % — ABNORMAL HIGH (ref 34.0–46.6)
Hemoglobin: 16.3 g/dL — ABNORMAL HIGH (ref 11.1–15.9)
Immature Grans (Abs): 0 10*3/uL (ref 0.0–0.1)
Immature Granulocytes: 0 %
Lymphocytes Absolute: 1.5 10*3/uL (ref 0.7–3.1)
Lymphs: 16 %
MCH: 31.1 pg (ref 26.6–33.0)
MCHC: 33.9 g/dL (ref 31.5–35.7)
MCV: 92 fL (ref 79–97)
Monocytes Absolute: 0.4 10*3/uL (ref 0.1–0.9)
Monocytes: 5 %
Neutrophils Absolute: 7.3 10*3/uL — ABNORMAL HIGH (ref 1.4–7.0)
Neutrophils: 77 %
Platelets: 337 10*3/uL (ref 150–450)
RBC: 5.24 x10E6/uL (ref 3.77–5.28)
RDW: 13.4 % (ref 11.7–15.4)
WBC: 9.4 10*3/uL (ref 3.4–10.8)

## 2020-01-09 LAB — COMPREHENSIVE METABOLIC PANEL
ALT: 18 IU/L (ref 0–32)
AST: 24 IU/L (ref 0–40)
Albumin/Globulin Ratio: 1.6 (ref 1.2–2.2)
Albumin: 4.7 g/dL (ref 3.8–4.9)
Alkaline Phosphatase: 141 IU/L — ABNORMAL HIGH (ref 48–121)
BUN/Creatinine Ratio: 18 (ref 12–28)
BUN: 14 mg/dL (ref 8–27)
Bilirubin Total: 0.6 mg/dL (ref 0.0–1.2)
CO2: 20 mmol/L (ref 20–29)
Calcium: 9.9 mg/dL (ref 8.7–10.3)
Chloride: 103 mmol/L (ref 96–106)
Creatinine, Ser: 0.79 mg/dL (ref 0.57–1.00)
GFR calc Af Amer: 94 mL/min/{1.73_m2} (ref >59)
GFR calc non Af Amer: 82 mL/min/{1.73_m2} (ref >59)
Globulin, Total: 3 g/dL (ref 1.5–4.5)
Glucose: 99 mg/dL (ref 65–99)
Potassium: 4.3 mmol/L (ref 3.5–5.2)
Sodium: 140 mmol/L (ref 134–144)
Total Protein: 7.7 g/dL (ref 6.0–8.5)

## 2020-01-09 LAB — LIPID PANEL W/O CHOL/HDL RATIO
Cholesterol, Total: 244 mg/dL — ABNORMAL HIGH (ref 100–199)
HDL: 69 mg/dL (ref >39)
LDL Chol Calc (NIH): 140 mg/dL — ABNORMAL HIGH (ref 0–99)
Triglycerides: 196 mg/dL — ABNORMAL HIGH (ref 0–149)
VLDL Cholesterol Cal: 35 mg/dL (ref 5–40)

## 2020-01-09 LAB — TSH: TSH: 2.03 u[IU]/mL (ref 0.450–4.500)

## 2020-01-09 LAB — VITAMIN D 25 HYDROXY (VIT D DEFICIENCY, FRACTURES): Vit D, 25-Hydroxy: 22.8 ng/mL — ABNORMAL LOW (ref 30.0–100.0)

## 2020-01-09 NOTE — Telephone Encounter (Signed)
**Note De-Identified Cong Obfuscation** PA for Omeprazole initiated and submitted PA with [employer]. Prior Auth ID: [id number]

## 2020-01-09 NOTE — Progress Notes (Signed)
**Note De-Identified [place] Obfuscation** Contacted [facility] evening Haley [name], your labs have returned and overall look great with a couple exceptions: - Cholesterol levels remain elevated, you may benefit from statin to help lower cholesterol in future, especially since you smoke.  This would decrease risk for stroke or MI.  If interested we could start at a low dose and work way up as needed based on levels.  Side effects with these are things like muscle aches and fatigue, but I find with starting low and going slow this helps decrease these effects.  If interested in trying one let me know.  If not then focus heavily on diet changes with lower cholesterol. - Vitamin D on lower side, I would recommend starting Vitamin D3 supplement 1000 units daily, which you can get in any vitamin section.   Any questions about labs?  Let me know your thoughts. Keep being awesome!! Kindest regards, Haley [name]

## 2020-01-10 LAB — CYTOLOGY - PAP
Comment: NEGATIVE
Diagnosis: NEGATIVE
High risk HPV: NEGATIVE

## 2020-01-10 NOTE — Progress Notes (Signed)
**Note De-Identified [place] Obfuscation** Contacted [facility] evening Jkayla, your pap returned negative!!!  Woohoo!!  Repeat in 5 years and if negative that will be last one.  Have a great evening. Keep being awesome!! Kindest regards, Elah Avellino

## 2020-01-16 NOTE — Telephone Encounter (Signed)
**Note De-identified [place] Obfuscation** PA was approved. 

## 2020-01-19 ENCOUNTER — Ambulatory Visit: Payer: 59 | Admitting: Nurse Practitioner

## 2020-01-19 ENCOUNTER — Encounter: Payer: Self-pay | Admitting: *Deleted

## 2020-01-19 ENCOUNTER — Telehealth: Payer: Self-pay | Admitting: *Deleted

## 2020-01-19 DIAGNOSIS — Z87891 Personal history of nicotine dependence: Secondary | ICD-10-CM

## 2020-01-19 DIAGNOSIS — Z122 Encounter for screening for malignant neoplasm of respiratory organs: Secondary | ICD-10-CM

## 2020-01-19 NOTE — Telephone Encounter (Signed)
**Note De-identified [place] Obfuscation** Received referral for low dose lung cancer screening CT scan. Message left at phone number listed in EMR for patient to call me back to facilitate scheduling scan.  

## 2020-01-31 NOTE — Telephone Encounter (Signed)
**Note De-Identified [place] Obfuscation** Received referral for initial lung cancer screening scan. Contacted patient and obtained smoking history,(current, 35 pack year) as well as answering questions related to screening process. Patient denies signs of lung cancer such as weight loss or hemoptysis. Patient denies comorbidity that would prevent curative treatment if lung cancer were found. Patient is scheduled for shared decision making visit and CT scan on 02/08/20.

## 2020-02-02 ENCOUNTER — Other Ambulatory Visit: Payer: Self-pay

## 2020-02-02 ENCOUNTER — Encounter: Payer: Self-pay | Admitting: Nurse Practitioner

## 2020-02-02 ENCOUNTER — Ambulatory Visit (INDEPENDENT_AMBULATORY_CARE_PROVIDER_SITE_OTHER): Payer: 59 | Admitting: Nurse Practitioner

## 2020-02-02 VITALS — BP 136/88 | HR 76 | Temp 98.3°F | Wt 106.0 lb

## 2020-02-02 DIAGNOSIS — N898 Other specified noninflammatory disorders of vagina: Secondary | ICD-10-CM | POA: Insufficient documentation

## 2020-02-02 DIAGNOSIS — R311 Benign essential microscopic hematuria: Secondary | ICD-10-CM | POA: Diagnosis not present

## 2020-02-02 LAB — UA/M W/RFLX CULTURE, ROUTINE
Bilirubin, UA: NEGATIVE
Glucose, UA: NEGATIVE
Ketones, UA: NEGATIVE
Leukocytes,UA: NEGATIVE
Nitrite, UA: NEGATIVE
Protein,UA: NEGATIVE
Specific Gravity, UA: 1.015 (ref 1.005–1.030)
Urobilinogen, Ur: 0.2 mg/dL (ref 0.2–1.0)
pH, UA: 6.5 (ref 5.0–7.5)

## 2020-02-02 LAB — WET PREP FOR TRICH, YEAST, CLUE
Clue Cell Exam: NEGATIVE
Trichomonas Exam: NEGATIVE
Yeast Exam: NEGATIVE

## 2020-02-02 LAB — MICROSCOPIC EXAMINATION
Bacteria, UA: NONE SEEN
WBC, UA: NONE SEEN /hpf (ref 0–5)

## 2020-02-02 MED ORDER — OMEPRAZOLE 20 MG PO CPDR: 20.0000 mg | DELAYED_RELEASE_CAPSULE | Freq: Two times a day (BID) | ORAL | 3 refills | Status: DC

## 2020-02-02 MED ORDER — TIZANIDINE HCL 4 MG PO TABS: 4.0000 mg | ORAL_TABLET | Freq: Four times a day (QID) | ORAL | 0 refills | Status: DC | PRN

## 2020-02-02 MED ORDER — TAMSULOSIN HCL 0.4 MG PO CAPS: 0.4000 mg | ORAL_CAPSULE | Freq: Every day | ORAL | 3 refills | Status: DC

## 2020-02-02 NOTE — Patient Instructions (Signed)
**Note De-identified [place] Obfuscation** Hematuria, Adult Hematuria is blood in the urine. Blood may be visible in the urine, or it may be identified with a test. This condition can be caused by infections of the bladder, urethra, kidney, or prostate. Other possible causes include:  Kidney stones.  Cancer of the urinary tract.  Too much calcium in the urine.  Conditions that are passed from parent to child (inherited conditions).  Exercise that requires a lot of energy. Infections can usually be treated with medicine, and a kidney stone usually will pass through your urine. If neither of these is the cause of your hematuria, more tests may be needed to identify the cause of your symptoms. It is very important to tell your health care provider about any blood in your urine, even if it is painless or the blood stops without treatment. Blood in the urine, when it happens and then stops and then happens again, can be a symptom of a very serious condition, including cancer. There is no pain in the initial stages of many urinary cancers. Follow these instructions at home: Medicines  Take over-the-counter and prescription medicines only as told by your health care provider.  If you were prescribed an antibiotic medicine, take it as told by your health care provider. Do not stop taking the antibiotic even if you start to feel better. Eating and drinking  Drink enough fluid to keep your urine clear or pale yellow. It is recommended that you drink 3-4 quarts (2.8-3.8 L) a day. If you have been diagnosed with an infection, it is recommended that you drink cranberry juice in addition to large amounts of water.  Avoid caffeine, tea, and carbonated beverages. These tend to irritate the bladder.  Avoid alcohol because it may irritate the prostate (men). General instructions  If you have been diagnosed with a kidney stone, follow your health care provider's instructions about straining your urine to catch the stone.  Empty your bladder  often. Avoid holding urine for long periods of time.  If you are female: ? After a bowel movement, wipe from front to back and use each piece of toilet paper only once. ? Empty your bladder before and after sex.  Pay attention to any changes in your symptoms. Tell your health care provider about any changes or any new symptoms.  It is your responsibility to get your test results. Ask your health care provider, or the department performing the test, when your results will be ready.  Keep all follow-up visits as told by your health care provider. This is important. Contact a health care provider if:  You develop back pain.  You have a fever.  You have nausea or vomiting.  Your symptoms do not improve after 3 days.  Your symptoms get worse. Get help right away if:  You develop severe vomiting and are unable take medicine without vomiting.  You develop severe pain in your back or abdomen even though you are taking medicine.  You pass a large amount of blood in your urine.  You pass blood clots in your urine.  You feel very weak or like you might faint.  You faint. Summary  Hematuria is blood in the urine. It has many possible causes.  It is very important that you tell your health care provider about any blood in your urine, even if it is painless or the blood stops without treatment.  Take over-the-counter and prescription medicines only as told by your health care provider.  Drink enough fluid to keep  **Note De-identified Maresh Obfuscation** your urine clear or pale yellow. This information is not intended to replace advice given to you by your health care provider. Make sure you discuss any questions you have with your health care provider. Document Revised: 12/14/2018 Document Reviewed: 08/22/2016 Elsevier Patient Education  2020 [employer]  

## 2020-02-02 NOTE — Assessment & Plan Note (Signed)
**Note De-Identified [place] Obfuscation** Acute and noted on labs today.  UA showing 2 + BLD and 3-10 RBC, remainder of urine negative.  She is a smoker and is having some flank pain + nausea.  Will initiate Flomax (she denies allergy to sulfa -- states has had nausea with it and would like to try Flomax) and she is to utilize Tizanidine as needed at home.  Order for CT for hematuria work-up to r/o kidney stone or other underlying cause for hematuria.  Plan to return in 2 weeks for follow-up and recheck urine.  If ongoing will refer back to urology for further assessment, dependent on image findings and urine recheck.

## 2020-02-02 NOTE — Assessment & Plan Note (Signed)
**Note De-Identified [place] Obfuscation** Acute and improving with home care.  Wet prep negative today.  Recommend to continue current home regimen as appears to be improving with this.  Return to office for worsening symptoms.

## 2020-02-02 NOTE — Progress Notes (Signed)
**Note De-Identified [place] Obfuscation** BP 136/88   Pulse 76   Temp 98.3 F (36.8 C) (Oral)   Wt 106 lb (48.1 kg)   SpO2 97%   BMI 21.41 kg/m    Subjective:    Patient ID: Haley [name], female    DOB: April 12, 1961, 59 y.o.   MRN: [id number]  HPI: Haley [name] is a 59 y.o. female  Chief Complaint  Patient presents with  . Abdominal Pain  . Vaginal Itching    burning x a week   ABDOMINAL PAIN  Treated on 01/08/2020 for diverticulitis with Augmentin.  Reports abdomen feels nauseous and bloated today, generalized.  Plus reports discomfort from right side flank and around into front.  Had colonoscopy in 2017 noting diverticulosis of sigmoid colon.  Saw Dr. Bary Castilla a few months ago and was told to take Miralax, which she reports has helped when she takes consistently, but when stops pain returns.  She does endorse poor diet choices -- tomatoes, squash, strawberries. Has regular BM every day. Drinks lots of soda at home.  No history of kidney stones.  Does endorse history of polyps in bladder in the 90's and last saw urology in 2016.   Duration:days Onset: gradual Severity: mild Quality: dull and aching Location:  diffuse  Episode duration:  Radiation: no Frequency: intermittent Alleviating factors:  Aggravating factors: Status: fluctuating Treatments attempted: none Fever: no Nausea: yes Vomiting: no Weight loss: no Decreased appetite: mild decrease Diarrhea: occasional Constipation: no Blood in stool: no Heartburn: no Jaundice: no Rash: no Dysuria/urinary frequency: no Hematuria: no History of sexually transmitted disease: no Recurrent NSAID use: no   VAGINAL PRURITUS Started about 1 1/2 weeks ago, she thought it was yeast from her recent abx use.  She wore cooler underwear and clothing. Did feel better today.   Duration: weeks Discharge description: white  Pruritus: yes Dysuria: no Malodorous: no Urinary frequency: no Fevers: no Abdominal pain: as above Sexual activity: not active History of  sexually transmitted diseases: no Recent antibiotic use: yes Context: better  Treatments attempted: as above  Relevant past medical, surgical, family and social history reviewed and updated as indicated. Interim medical history since our last visit reviewed. Allergies and medications reviewed and updated.  Review of Systems  Constitutional: Negative for activity change, appetite change, diaphoresis, fatigue and fever.  Respiratory: Negative for cough, chest tightness, shortness of breath and wheezing.   Cardiovascular: Negative for chest pain, palpitations and leg swelling.  Gastrointestinal: Positive for abdominal distention (bloated at times) and nausea. Negative for abdominal pain, blood in stool, constipation, diarrhea and vomiting.  Genitourinary: Positive for flank pain and vaginal discharge. Negative for decreased urine volume, difficulty urinating, dysuria, frequency, urgency and vaginal bleeding.  Neurological: Negative.   Psychiatric/Behavioral: Negative for decreased concentration, self-injury, sleep disturbance and suicidal ideas. The patient is not nervous/anxious.     Per HPI unless specifically indicated above     Objective:    BP 136/88   Pulse 76   Temp 98.3 F (36.8 C) (Oral)   Wt 106 lb (48.1 kg)   SpO2 97%   BMI 21.41 kg/m   Wt Readings from Last 3 Encounters:  02/02/20 106 lb (48.1 kg)  01/08/20 104 lb 3.2 oz (47.3 kg)  03/01/19 115 lb (52.2 kg)    Physical Exam Vitals and nursing note reviewed.  Constitutional:      General: She is awake. She is not in acute distress.    Appearance: She is well-developed and well-groomed. She is not **Note De-Identified Christ[place]erson Obfuscation** ill-appearing.  HENT:     Head: Normocephalic.     Right Ear: Hearing normal.     Left Ear: Hearing normal.  Eyes:     General: Lids are normal.        Right eye: No discharge.        Left eye: No discharge.     Conjunctiva/sclera: Conjunctivae normal.     Pupils: Pupils are equal, round, and reactive to light.   Neck:     Thyroid: No thyromegaly.     Vascular: No carotid bruit.  Cardiovascular:     Rate and Rhythm: Normal rate and regular rhythm.     Heart sounds: Normal heart sounds. No murmur heard.  No gallop.   Pulmonary:     Effort: Pulmonary effort is normal. No accessory muscle usage or respiratory distress.     Breath sounds: Normal breath sounds.  Abdominal:     General: Bowel sounds are normal. There is no distension.     Palpations: Abdomen is soft. There is no hepatomegaly.     Tenderness: There is no abdominal tenderness. There is no right CVA tenderness, left CVA tenderness, guarding or rebound.     Comments: Improved tenderness on exam today, compared to previous visit.  Musculoskeletal:     Cervical back: Normal range of motion and neck supple.     Right lower leg: No edema.     Left lower leg: No edema.  Skin:    General: Skin is warm and dry.  Neurological:     Mental Status: She is alert and oriented to person, place, and time.  Psychiatric:        Attention and Perception: Attention normal.        Mood and Affect: Mood normal.        Behavior: Behavior normal. Behavior is cooperative.        Thought Content: Thought content normal.        Judgment: Judgment normal.     Results for orders placed or performed in visit on 02/02/20  WET PREP FOR [place], YEAST, CLUE   Specimen: Vaginal; Sterile Swab   Sterile Swab  Result Value Ref Range   Trichomonas Exam Negative Negative   Yeast Exam Negative Negative   Clue Cell Exam Negative Negative  Microscopic Examination   Urine  Result Value Ref Range   WBC, UA None seen 0 - 5 /hpf   RBC 3-10 (A) 0 - 2 /hpf   Epithelial Cells (non renal) 0-10 0 - 10 /hpf   Bacteria, UA None seen None seen/Few  UA/M w/rflx Culture, Routine   Specimen: Urine   Urine  Result Value Ref Range   Specific Gravity, UA 1.015 1.005 - 1.030   pH, UA 6.5 5.0 - 7.5   Color, UA Yellow Yellow   Appearance Ur Clear Clear   Leukocytes,UA  Negative Negative   Protein,UA Negative Negative/Trace   Glucose, UA Negative Negative   Ketones, UA Negative Negative   RBC, UA 2+ (A) Negative   Bilirubin, UA Negative Negative   Urobilinogen, Ur 0.2 0.2 - 1.0 mg/dL   Nitrite, UA Negative Negative   Microscopic Examination See below:       Assessment & Plan:   Problem List Items Addressed This Visit      Musculoskeletal and Integument   Vaginal itching    Acute and improving with home care.  Wet prep negative today.  Recommend to continue current home regimen as appears to be improving **Note De-Identified [place]man Obfuscation** with this.  Return to office for worsening symptoms.      Relevant Orders   UA/M w/rflx Culture, Routine (Completed)   WET PREP FOR [place], YEAST, CLUE (Completed)     Genitourinary   Benign essential microscopic hematuria - Primary    Acute and noted on labs today.  UA showing 2 + BLD and 3-10 RBC, remainder of urine negative.  She is a smoker and is having some flank pain + nausea.  Will initiate Flomax (she denies allergy to sulfa -- states has had nausea with it and would like to try Flomax) and she is to utilize Tizanidine as needed at home.  Order for CT for hematuria work-up to r/o kidney stone or other underlying cause for hematuria.  Plan to return in 2 weeks for follow-up and recheck urine.  If ongoing will refer back to urology for further assessment, dependent on image findings and urine recheck.        Relevant Orders   CT HEMATURIA WORKUP       Follow up plan: Return in about 2 weeks (around 02/16/2020) for Hematuria.

## 2020-02-08 ENCOUNTER — Other Ambulatory Visit: Payer: Self-pay

## 2020-02-08 ENCOUNTER — Encounter: Payer: Self-pay | Admitting: Hospice and Palliative Medicine

## 2020-02-08 ENCOUNTER — Inpatient Hospital Stay: Payer: 59 | Attending: Oncology | Admitting: Hospice and Palliative Medicine

## 2020-02-08 ENCOUNTER — Ambulatory Visit
Admission: RE | Admit: 2020-02-08 | Discharge: 2020-02-08 | Disposition: A | Discharge status: Home / Self Care | Payer: 59 | Source: Ambulatory Visit | Attending: Oncology | Admitting: Oncology

## 2020-02-08 DIAGNOSIS — Z122 Encounter for screening for malignant neoplasm of respiratory organs: Secondary | ICD-10-CM | POA: Diagnosis not present

## 2020-02-08 DIAGNOSIS — Z87891 Personal history of nicotine dependence: Secondary | ICD-10-CM | POA: Insufficient documentation

## 2020-02-08 NOTE — Progress Notes (Signed)
**Note De-Identified [place] Obfuscation** Virtual Visit [place] Telephone Note  I connected with@ on 02/08/20 at@ by telemedicine application and verified that I am speaking with the correct person using two identifiers.   I discussed the limitations of evaluation and management by telemedicine and the availability of in person appointments. The patient expressed understanding and agreed to proceed.  In accordance with CMS guidelines, patient has met eligibility criteria including age, absence of signs or symptoms of lung cancer.  Social History   Tobacco Use  . Smoking status: Current Every Day Smoker    Packs/day: 1.00    Years: 35.00    Pack years: 35.00    Types: Cigarettes    Last attempt to quit: 08/03/2013    Years since quitting: 6.5  . Smokeless tobacco: Never Used  Vaping Use  . Vaping Use: Never used  Substance Use Topics  . Alcohol use: No    Comment: occasional  . Drug use: No      A shared decision-making session was conducted prior to the performance of CT scan. This includes one or more decision aids, includes benefits and harms of screening, follow-up diagnostic testing, over-diagnosis, false positive rate, and total radiation exposure.   Counseling on the importance of adherence to annual lung cancer LDCT screening, impact of co-morbidities, and ability or willingness to undergo diagnosis and treatment is imperative for compliance of the program.   Counseling on the importance of continued smoking cessation for former smokers; the importance of smoking cessation for current smokers, and information about tobacco cessation interventions have been given to patient including [place] and [facility] quit [place] programs.   Written order for lung cancer screening with LDCT has been given to the patient and any and all questions have been answered to the best of my abilities.    Yearly follow up will be coordinated by Burgess Estelle, Thoracic Navigator.  Time Total: 15 minutes  Visit consisted of counseling  and education dealing with complex health screening. Greater than 50%  of this time was spent counseling and coordinating care related to the above assessment and plan.  Signed by: Altha Harm, PhD, NP-C

## 2020-02-09 ENCOUNTER — Telehealth: Payer: Self-pay | Admitting: Nurse Practitioner

## 2020-02-09 DIAGNOSIS — I700 Atherosclerosis of aorta: Secondary | ICD-10-CM | POA: Insufficient documentation

## 2020-02-09 MED ORDER — AZITHROMYCIN 250 MG PO TABS: ORAL_TABLET | ORAL | 0 refills | Status: DC

## 2020-02-09 NOTE — Telephone Encounter (Signed)
**Note De-Identified [place] Obfuscation** Spoke to patient on phone and reviewed her CT scan results with her.  Initial lung CT screening noted: - Mild centrilobular emphysema -- educated her on this and will do PFTs next visit to assess lung function -- may need maintenance inhaler along with her Albuterol -- recommend complete cessation of smoking - Aortic Atherosclerosis -- recommend daily Baby ASA, may need to initiate statin, and complete cessation of smoking -- educated her on what this finding is - Possibly viral pneumonia present, have recommended she get Covid testing done and self quarantine until results returned.  Will send in [place] as ground glass opacities present -- inflammatory vs viral PNA  -- discussed at length with her.  She denies SOB, fever.  Has baseline mild cough.  Continues to have some rib pain reported -- may be related to this finding.  Recommend for any worsening symptoms to immediately go to ER.    She was able to verbalize all this back to provider and has follow-up scheduled for 02/16/20.  Plan on repeat CXR in 6 weeks to follow-up.

## 2020-02-13 ENCOUNTER — Ambulatory Visit: Admission: RE | Admit: 2020-02-13 | Payer: 59 | Source: Ambulatory Visit

## 2020-02-16 ENCOUNTER — Other Ambulatory Visit: Payer: Self-pay

## 2020-02-16 ENCOUNTER — Telehealth: Payer: Self-pay | Admitting: Nurse Practitioner

## 2020-02-16 ENCOUNTER — Ambulatory Visit (INDEPENDENT_AMBULATORY_CARE_PROVIDER_SITE_OTHER): Payer: 59 | Admitting: Nurse Practitioner

## 2020-02-16 ENCOUNTER — Encounter: Payer: Self-pay | Admitting: Nurse Practitioner

## 2020-02-16 VITALS — BP 128/74 | HR 82 | Temp 98.8°F | Wt 104.8 lb

## 2020-02-16 DIAGNOSIS — R311 Benign essential microscopic hematuria: Secondary | ICD-10-CM | POA: Diagnosis not present

## 2020-02-16 DIAGNOSIS — J432 Centrilobular emphysema: Secondary | ICD-10-CM

## 2020-02-16 DIAGNOSIS — I700 Atherosclerosis of aorta: Secondary | ICD-10-CM

## 2020-02-16 DIAGNOSIS — J189 Pneumonia, unspecified organism: Secondary | ICD-10-CM

## 2020-02-16 DIAGNOSIS — F17210 Nicotine dependence, cigarettes, uncomplicated: Secondary | ICD-10-CM

## 2020-02-16 MED ORDER — TIZANIDINE HCL 4 MG PO TABS: 4.0000 mg | ORAL_TABLET | Freq: Four times a day (QID) | ORAL | 0 refills | Status: DC | PRN

## 2020-02-16 NOTE — Progress Notes (Signed)
**Note De-Identified Prazak Obfuscation** BP 128/74 (BP Location: Left Arm)    Pulse 82    Temp 98.8 F (37.1 C) (Oral)    Wt 104 lb 12.8 oz (47.5 kg)    SpO2 96%    BMI 21.17 kg/m    Subjective:    Patient ID: Haley [name], female    DOB: 1961/03/19, 59 y.o.   MRN: [id number]  HPI: Haley [name] is a 59 y.o. female  Chief Complaint  Patient presents with   Hematuria   Pain   ABDOMINAL PAIN  Treated on 01/08/2020 for diverticulitis with Augmentin. Noted on exam 02/02/2020 to have some hematuria on urine and was started on Flomax and muscle relaxer, was to go for abdominal CT but missed this.  Have recommended she reschedule this as still having discomfort to LUQ under ribs area and around into right mid-back.  Is taking Flomax and Tizanidine, but reports only mild benefit from Tizanidine.    Of note on recent lung CT screening possible PNA noted, she was started on abx treatment on 02/09/20 for this -- Azithromycin.  Also noted aortic atherosclerosis and emphysema.  She reports having pneumonia before and having similar symptoms -- 6 years ago.  Reports having some PTSD symptoms with return of this due to her past experience with pneumonia -- was intubated for 2 weeks and in hospital for 6 weeks.  Has had PNA at least 3 times.  Has not had Covid vaccines.  Denies any SOB, fever, CP, wheezing, chills.  Does endorse decreased appetite and some discomfort to right lower rib area.  Continues to smoke. Duration:days Onset: gradual Severity: mild Quality: dull and aching Location:  RUQ Episode duration:  Radiation: no Frequency: intermittent Alleviating factors:  Aggravating factors: Status: fluctuating Treatments attempted: none Fever: no Nausea: yes Vomiting: no Weight loss: no Decreased appetite: mild decrease Diarrhea: occasional Constipation: no Blood in stool: no Heartburn: no Jaundice: no Rash: no Dysuria/urinary frequency: no Hematuria: no History of sexually transmitted disease: no Recurrent NSAID use: no    Relevant past medical, surgical, family and social history reviewed and updated as indicated. Interim medical history since our last visit reviewed. Allergies and medications reviewed and updated.  Review of Systems  Constitutional: Positive for appetite change. Negative for activity change, diaphoresis, fatigue and fever.  Respiratory: Negative for cough, chest tightness, shortness of breath and wheezing.   Cardiovascular: Negative for chest pain, palpitations and leg swelling.  Gastrointestinal: Positive for abdominal pain and nausea. Negative for abdominal distention, blood in stool, constipation, diarrhea and vomiting.  Genitourinary: Positive for flank pain. Negative for decreased urine volume, difficulty urinating, dysuria, frequency, hematuria, urgency, vaginal bleeding and vaginal discharge.  Neurological: Negative.   Psychiatric/Behavioral: Negative for decreased concentration, self-injury, sleep disturbance and suicidal ideas. The patient is nervous/anxious.     Per HPI unless specifically indicated above     Objective:    BP 128/74 (BP Location: Left Arm)    Pulse 82    Temp 98.8 F (37.1 C) (Oral)    Wt 104 lb 12.8 oz (47.5 kg)    SpO2 96%    BMI 21.17 kg/m   Wt Readings from Last 3 Encounters:  02/16/20 104 lb 12.8 oz (47.5 kg)  02/08/20 105 lb (47.6 kg)  02/02/20 106 lb (48.1 kg)    Physical Exam Vitals and nursing note reviewed.  Constitutional:      General: She is awake. She is not in acute distress.    Appearance: She is well-developed and **Note De-Identified Cal[place]illa Obfuscation** well-groomed. She is not ill-appearing.  HENT:     Head: Normocephalic.     Right Ear: Hearing normal.     Left Ear: Hearing normal.  Eyes:     General: Lids are normal.        Right eye: No discharge.        Left eye: No discharge.     Conjunctiva/sclera: Conjunctivae normal.     Pupils: Pupils are equal, round, and reactive to light.  Neck:     Thyroid: No thyromegaly.     Vascular: No carotid bruit.   Cardiovascular:     Rate and Rhythm: Normal rate and regular rhythm.     Heart sounds: Normal heart sounds. No murmur heard.  No gallop.   Pulmonary:     Effort: Pulmonary effort is normal. No accessory muscle usage or respiratory distress.     Breath sounds: Normal breath sounds.  Abdominal:     General: Bowel sounds are normal. There is no distension.     Palpations: Abdomen is soft. There is no hepatomegaly.     Tenderness: There is no abdominal tenderness. There is no right CVA tenderness, left CVA tenderness, guarding or rebound. Negative signs include Murphy's sign.     Hernia: No hernia is present.  Musculoskeletal:     Cervical back: Normal range of motion and neck supple.     Right lower leg: No edema.     Left lower leg: No edema.  Skin:    General: Skin is warm and dry.  Neurological:     Mental Status: She is alert and oriented to person, place, and time.  Psychiatric:        Attention and Perception: Attention normal.        Mood and Affect: Mood normal.        Behavior: Behavior normal. Behavior is cooperative.        Thought Content: Thought content normal.        Judgment: Judgment normal.     Results for orders placed or performed in visit on 02/02/20  WET PREP FOR [place], YEAST, CLUE   Specimen: Vaginal; Sterile Swab   Sterile Swab  Result Value Ref Range   Trichomonas Exam Negative Negative   Yeast Exam Negative Negative   Clue Cell Exam Negative Negative  Microscopic Examination   Urine  Result Value Ref Range   WBC, UA None seen 0 - 5 /hpf   RBC 3-10 (A) 0 - 2 /hpf   Epithelial Cells (non renal) 0-10 0 - 10 /hpf   Bacteria, UA None seen None seen/Few  UA/M w/rflx Culture, Routine   Specimen: Urine   Urine  Result Value Ref Range   Specific Gravity, UA 1.015 1.005 - 1.030   pH, UA 6.5 5.0 - 7.5   Color, UA Yellow Yellow   Appearance Ur Clear Clear   Leukocytes,UA Negative Negative   Protein,UA Negative Negative/Trace   Glucose, UA Negative  Negative   Ketones, UA Negative Negative   RBC, UA 2+ (A) Negative   Bilirubin, UA Negative Negative   Urobilinogen, Ur 0.2 0.2 - 1.0 mg/dL   Nitrite, UA Negative Negative   Microscopic Examination See below:       Assessment & Plan:   Problem List Items Addressed This Visit      Cardiovascular and Mediastinum   Aortic atherosclerosis ([place])    New diagnosis, noted on recent lung screening.  Will continue to recommend initiation of statin and daily baby **Note De-Identified Tankard Obfuscation** ASA.  Recommend complete cessation of smoking.        Respiratory   Centrilobular emphysema ([facility]CC) - Primary    Chronic, stable. Continue Albuterol inhaler PRN, script sent, and Singulair daily.  Adjust inhaler regimen as needed, consider maintenance if worsening.  Consider spirometry at next visit.  No recent exacerbations.      Pneumonia due to infectious organism    Possible PNA noted on recent lung CT screening, she has completed her abx treatment and denies any cough or fever.  Recommend she obtain Covid testing and self quarantine until she has results.  Offered empathetic listening due to her fears of PNA due to past experiences. Will plan on repeat CXR in August.  Return to office in one week.      Relevant Orders   Comprehensive metabolic panel   CBC With Differential/Platelet     Genitourinary   Benign essential microscopic hematuria    Acute and noted on urine recheck today.  UA showing 2 + BLD and 0-5 RBC, 1+ LEUK, remainder of urine negative.  She is a smoker and is having some flank pain + nausea.  Will continue Flomax (she denies allergy to sulfa -- states has had nausea with sulfa drugs before and would like to continue Flomax) and she is to utilize Tizanidine as needed at home.  Recommend she obtain her CT for hematuria work-up to r/o kidney stone or other underlying cause for hematuria, she missed appointment.  Plan to return in 1 weeks for follow-up.  If ongoing will refer back to urology for further assessment,  dependent on image findings and urine recheck.  For worsening symptoms recommend she immediately go to ER.      Relevant Orders   UA/M w/rflx Culture, Routine (STAT)     Other   Cigarette nicotine dependence without complication    I have recommended complete cessation of tobacco use. I have discussed various options available for assistance with tobacco cessation including over the counter methods (Nicotine gum, patch and lozenges). We also discussed prescription options (Chantix, Nicotine Inhaler / Nasal Spray). The patient is not interested in pursuing any prescription tobacco cessation options at this time.           Follow up plan: Return in about 1 week (around 02/23/2020) for In office for hematuria follow-up -- can see Malachy Mood if needed while she covers for me.

## 2020-02-16 NOTE — Patient Instructions (Addendum)
**Note De-Identified Vittorio Obfuscation** Around August 20th go to [facility] medical center for chest -xray   Community-Acquired Pneumonia, Adult Pneumonia is an infection of the lungs. It causes swelling in the airways of the lungs. Mucus and fluid may also build up inside the airways. One type of pneumonia can happen while a person is in a hospital. A different type can happen when a person is not in a hospital (community-acquired pneumonia).  What are the causes?  This condition is caused by germs (viruses, bacteria, or fungi). Some types of germs can be passed from one person to another. This can happen when you breathe in droplets from the cough or sneeze of an infected person. What increases the risk? You are more likely to develop this condition if you:  Have a long-term (chronic) disease, such as: ? Chronic obstructive pulmonary disease (COPD). ? Asthma. ? Cystic fibrosis. ? Congestive heart failure. ? Diabetes. ? Kidney disease.  Have HIV.  Have sickle cell disease.  Have had your spleen removed.  Do not take good care of your teeth and mouth (poor dental hygiene).  Have a medical condition that increases the risk of breathing in droplets from your own mouth and nose.  Have a weakened body defense system (immune system).  Are a smoker.  Travel to areas where the germs that cause this illness are common.  Are around certain animals or the places they live. What are the signs or symptoms?  A dry cough.  A wet (productive) cough.  Fever.  Sweating.  Chest pain. This often happens when breathing deeply or coughing.  Fast breathing or trouble breathing.  Shortness of breath.  Shaking chills.  Feeling tired (fatigue).  Muscle aches. How is this treated? Treatment for this condition depends on many things. Most adults can be treated at home. In some cases, treatment must happen in a hospital. Treatment may include:  Medicines given by mouth or through an IV tube.  Being given extra  oxygen.  Respiratory therapy. In rare cases, treatment for very bad pneumonia may include:  Using a machine to help you breathe.  Having a procedure to remove fluid from around your lungs. Follow these instructions at home: Medicines  Take over-the-counter and prescription medicines only as told by your doctor. ? Only take cough medicine if you are losing sleep.  If you were prescribed an antibiotic medicine, take it as told by your doctor. Do not stop taking the antibiotic even if you start to feel better. General instructions   Sleep with your head and neck raised (elevated). You can do this by sleeping in a recliner or by putting a few pillows under your head.  Rest as needed. Get at least 8 hours of sleep each night.  Drink enough water to keep your pee (urine) pale yellow.  Eat a healthy diet that includes plenty of vegetables, fruits, whole grains, low-fat dairy products, and lean protein.  Do not use any products that contain nicotine or tobacco. These include cigarettes, e-cigarettes, and chewing tobacco. If you need help quitting, ask your doctor.  Keep all follow-up visits as told by your doctor. This is important. How is this prevented? A shot (vaccine) can help prevent pneumonia. Shots are often suggested for:  People older than 60 years of age.  People older than 60 years of age who: ? Are having cancer treatment. ? Have long-term (chronic) lung disease. ? Have problems with their body's defense system. You may also prevent pneumonia if you take these actions: **Note De-Identified Burgner Obfuscation** Get the flu (influenza) shot every year.  Go to the dentist as often as told.  Wash your hands often. If you cannot use soap and water, use hand sanitizer. Contact a doctor if:  You have a fever.  You lose sleep because your cough medicine does not help. Get help right away if:  You are short of breath and it gets worse.  You have more chest pain.  Your sickness gets worse. This is very  serious if: ? You are an older adult. ? Your body's defense system is weak.  You cough up blood. Summary  Pneumonia is an infection of the lungs.  Most adults can be treated at home. Some will need treatment in a hospital.  Drink enough water to keep your pee pale yellow.  Get at least 8 hours of sleep each night. This information is not intended to replace advice given to you by your health care provider. Make sure you discuss any questions you have with your health care provider. Document Revised: 11/09/2018 Document Reviewed: 03/17/2018 Elsevier Patient Education  [place].  Abdominal Pain, Adult Many things can cause belly (abdominal) pain. Most times, belly pain is not dangerous. Many cases of belly pain can be watched and treated at home. Sometimes, though, belly pain is serious. Your doctor will try to find the cause of your belly pain. Follow these instructions at home:  Medicines  Take over-the-counter and prescription medicines only as told by your doctor.  Do not take medicines that help you poop (laxatives) unless told by your doctor. General instructions  Watch your belly pain for any changes.  Drink enough fluid to keep your pee (urine) pale yellow.  Keep all follow-up visits as told by your doctor. This is important. Contact a doctor if:  Your belly pain changes or gets worse.  You are not hungry, or you lose weight without trying.  You are having trouble pooping (constipated) or have watery poop (diarrhea) for more than 2-3 days.  You have pain when you pee or poop.  Your belly pain wakes you up at night.  Your pain gets worse with meals, after eating, or with certain foods.  You are vomiting and cannot keep anything down.  You have a fever.  You have blood in your pee. Get help right away if:  Your pain does not go away as soon as your doctor says it should.  You cannot stop vomiting.  Your pain is only in areas of your belly, such  as the right side or the left lower part of the belly.  You have bloody or black poop, or poop that looks like tar.  You have very bad pain, cramping, or bloating in your belly.  You have signs of not having enough fluid or water in your body (dehydration), such as: ? Dark pee, very little pee, or no pee. ? Cracked lips. ? Dry mouth. ? Sunken eyes. ? Sleepiness. ? Weakness.  You have trouble breathing or chest pain. Summary  Many cases of belly pain can be watched and treated at home.  Watch your belly pain for any changes.  Take over-the-counter and prescription medicines only as told by your doctor.  Contact a doctor if your belly pain changes or gets worse.  Get help right away if you have very bad pain, cramping, or bloating in your belly. This information is not intended to replace advice given to you by your health care provider. Make sure you discuss any **Note De-Identified Posthumus Obfuscation** questions you have with your health care provider. Document Revised: 11/28/2018 Document Reviewed: 11/28/2018 Elsevier Patient Education  [place].

## 2020-02-16 NOTE — Assessment & Plan Note (Signed)
**Note De-Identified Orea Obfuscation** Possible PNA noted on recent lung CT screening, she has completed her abx treatment and denies any cough or fever.  Recommend she obtain Covid testing and self quarantine until she has results.  Offered empathetic listening due to her fears of PNA due to past experiences. Will plan on repeat CXR in August.  Return to office in one week.

## 2020-02-16 NOTE — Assessment & Plan Note (Signed)
**Note De-Identified Snowball Obfuscation** Acute and noted on urine recheck today.  UA showing 2 + BLD and 0-5 RBC, 1+ LEUK, remainder of urine negative.  She is a smoker and is having some flank pain + nausea.  Will continue Flomax (she denies allergy to sulfa -- states has had nausea with sulfa drugs before and would like to continue Flomax) and she is to utilize Tizanidine as needed at home.  Recommend she obtain her CT for hematuria work-up to r/o kidney stone or other underlying cause for hematuria, she missed appointment.  Plan to return in 1 weeks for follow-up.  If ongoing will refer back to urology for further assessment, dependent on image findings and urine recheck.  For worsening symptoms recommend she immediately go to ER.

## 2020-02-16 NOTE — Assessment & Plan Note (Signed)
**Note De-Identified Moncada Obfuscation** Chronic, stable. Continue Albuterol inhaler PRN, script sent, and Singulair daily.  Adjust inhaler regimen as needed, consider maintenance if worsening.  Consider spirometry at next visit.  No recent exacerbations.

## 2020-02-16 NOTE — Assessment & Plan Note (Signed)
**Note De-Identified Carder Obfuscation** New diagnosis, noted on recent lung screening.  Will continue to recommend initiation of statin and daily baby ASA.  Recommend complete cessation of smoking.

## 2020-02-16 NOTE — Assessment & Plan Note (Signed)
**Note De-identified Dorton Obfuscation** I have recommended complete cessation of tobacco use. I have discussed various options available for assistance with tobacco cessation including over the counter methods (Nicotine gum, patch and lozenges). We also discussed prescription options (Chantix, Nicotine Inhaler / Nasal Spray). The patient is not interested in pursuing any prescription tobacco cessation options at this time.  

## 2020-02-16 NOTE — Telephone Encounter (Signed)
**Note De-Identified Ambers Obfuscation** Copied from [place] [id number]. Topic: General - Other >> Feb 15, 2020 11:16 AM Leward Quan A wrote: Reason for CRM: Patient called to inquire of Marnee Guarneri does she still want her to have the chest Xray today before coming in for her visit tomorrow. Asking for a call back with an answer Ph# [phone] >> Feb 16, 2020  7:44 AM Don Perking M wrote: Pt has an appt today at 4:20, please advise.

## 2020-02-17 LAB — CBC WITH DIFFERENTIAL/PLATELET
Hematocrit: 44.8 % (ref 34.0–46.6)
Hemoglobin: 15.5 g/dL (ref 11.1–15.9)
Lymphocytes Absolute: 2.1 10*3/uL (ref 0.7–3.1)
Lymphs: 23 %
MCH: 31.8 pg (ref 26.6–33.0)
MCHC: 34.6 g/dL (ref 31.5–35.7)
MCV: 92 fL (ref 79–97)
MID (Absolute): 0.5 10*3/uL (ref 0.1–1.6)
MID: 6 %
Neutrophils Absolute: 6.3 10*3/uL (ref 1.4–7.0)
Neutrophils: 71 %
Platelets: 319 10*3/uL (ref 150–450)
RBC: 4.88 x10E6/uL (ref 3.77–5.28)
RDW: 15 % (ref 11.7–15.4)
WBC: 8.9 10*3/uL (ref 3.4–10.8)

## 2020-02-17 LAB — COMPREHENSIVE METABOLIC PANEL
ALT: 19 IU/L (ref 0–32)
AST: 19 IU/L (ref 0–40)
Albumin/Globulin Ratio: 1.6 (ref 1.2–2.2)
Albumin: 4.5 g/dL (ref 3.8–4.9)
Alkaline Phosphatase: 129 IU/L — ABNORMAL HIGH (ref 48–121)
BUN/Creatinine Ratio: 12 (ref 12–28)
BUN: 9 mg/dL (ref 8–27)
Bilirubin Total: 0.7 mg/dL (ref 0.0–1.2)
CO2: 18 mmol/L — ABNORMAL LOW (ref 20–29)
Calcium: 9.7 mg/dL (ref 8.7–10.3)
Chloride: 100 mmol/L (ref 96–106)
Creatinine, Ser: 0.74 mg/dL (ref 0.57–1.00)
GFR calc Af Amer: 102 mL/min/{1.73_m2} (ref >59)
GFR calc non Af Amer: 88 mL/min/{1.73_m2} (ref >59)
Globulin, Total: 2.8 g/dL (ref 1.5–4.5)
Glucose: 104 mg/dL — ABNORMAL HIGH (ref 65–99)
Potassium: 3.2 mmol/L — ABNORMAL LOW (ref 3.5–5.2)
Sodium: 140 mmol/L (ref 134–144)
Total Protein: 7.3 g/dL (ref 6.0–8.5)

## 2020-02-18 LAB — MICROSCOPIC EXAMINATION: Bacteria, UA: NONE SEEN

## 2020-02-18 LAB — UA/M W/RFLX CULTURE, ROUTINE
Bilirubin, UA: NEGATIVE
Glucose, UA: NEGATIVE
Ketones, UA: NEGATIVE
Nitrite, UA: NEGATIVE
Protein,UA: NEGATIVE
Specific Gravity, UA: 1.01 (ref 1.005–1.030)
Urobilinogen, Ur: 0.2 mg/dL (ref 0.2–1.0)
pH, UA: 7 (ref 5.0–7.5)

## 2020-02-18 LAB — URINE CULTURE, REFLEX

## 2020-02-18 NOTE — Progress Notes (Signed)
**Note De-Identified Mihok Obfuscation** Contacted Haley [name] evening [name], your labs have returned: - CBC looks good - Potassium is a little on low side.  I would like you to start eating some foods higher in potassium, like bananas, sweet potatoes, kidney beans, pinto beans, cooked broccoli.  I would like to recheck your potassium level this week, please make sure to schedule your follow-up for this week and get your CT scan scheduled that you recently missed so we can get a closer look at things and see if any kidney stones present.  Any questions? Keep being awesome!! Kindest regards, Deral Schellenberg

## 2020-03-01 ENCOUNTER — Other Ambulatory Visit: Payer: Self-pay

## 2020-03-01 ENCOUNTER — Ambulatory Visit
Admission: RE | Admit: 2020-03-01 | Discharge: 2020-03-01 | Disposition: A | Discharge status: Home / Self Care | Payer: 59 | Source: Ambulatory Visit | Attending: Nurse Practitioner | Admitting: Nurse Practitioner

## 2020-03-01 DIAGNOSIS — R311 Benign essential microscopic hematuria: Secondary | ICD-10-CM | POA: Diagnosis not present

## 2020-03-01 MED ORDER — IOHEXOL 300 MG/ML  SOLN
100.0000 mL | Freq: Once | INTRAMUSCULAR | Status: AC | PRN
  Administered 2020-03-01: 100 mL via INTRAVENOUS

## 2020-03-03 ENCOUNTER — Other Ambulatory Visit: Payer: Self-pay | Admitting: Nurse Practitioner

## 2020-03-06 NOTE — Progress Notes (Signed)
**Note De-Identified Karren Obfuscation** Contacted Hansson Hoffman morning [place], your CT returned and appears normal.  No findings to explain your flank pain or recent blood in urine.  This is good news, possibly more related to an infection then.  We will recheck urine at next visit to further assess.  If any questions let me know.   Keep being awesome!! Kindest regards, Kamy Poinsett

## 2020-03-20 ENCOUNTER — Ambulatory Visit: Payer: 59 | Admitting: Nurse Practitioner

## 2020-04-17 ENCOUNTER — Other Ambulatory Visit: Payer: Self-pay | Admitting: Nurse Practitioner

## 2020-04-17 NOTE — Telephone Encounter (Signed)
**Note De-Identified [place] Obfuscation** Requested Prescriptions  Pending Prescriptions Disp Refills  . hydrOXYzine (VISTARIL) 25 MG capsule [Pharmacy Med Name: hydrOXYzine Pamoate 25 MG Oral Capsule] 90 capsule 2    Sig: TAKE 1 CAPSULE BY MOUTH THREE TIMES DAILY AS NEEDED FOR ANXIETY     Ear, Nose, and Throat:  Antihistamines Passed - 04/17/2020  5:30 AM      Passed - Valid encounter within last 12 months    Recent Outpatient Visits          2 months ago Centrilobular emphysema ([place])   [place], [place] T, NP   2 months ago Benign essential microscopic hematuria   [facility] [place], Bowman T, NP   3 months ago Encounter for annual physical exam   [employer], Wenona T, NP   6 months ago Depression, major, single episode, moderate ([place])   [place], Jolene T, NP   7 months ago Depression, major, single episode, moderate ([place])   [place], Barbaraann Faster, NP

## 2020-05-18 ENCOUNTER — Other Ambulatory Visit: Payer: Self-pay | Admitting: Nurse Practitioner

## 2020-05-18 NOTE — Telephone Encounter (Signed)
**Note De-Identified Seiber Obfuscation** Requested Prescriptions  Pending Prescriptions Disp Refills   montelukast (SINGULAIR) 10 MG tablet [Pharmacy Med Name: Montelukast Sodium 10 MG Oral Tablet] 30 tablet 0    Sig: TAKE 1 TABLET BY MOUTH AT BEDTIME     Pulmonology:  Leukotriene Inhibitors Passed - 05/18/2020  6:25 PM      Passed - Valid encounter within last 12 months    Recent Outpatient Visits          3 months ago Centrilobular emphysema ([place])   [place], Walker T, NP   3 months ago Benign essential microscopic hematuria   [place], [place] T, NP   4 months ago Encounter for annual physical exam   [employer], [place] T, NP   7 months ago Depression, major, single episode, moderate ([place])   [place], Jolene T, NP   8 months ago Depression, major, single episode, moderate ([place])   [place], Jolene T, NP              fluticasone (FLONASE) 50 MCG/ACT nasal spray [Pharmacy Med Name: Fluticasone Propionate 50 MCG/ACT Nasal Suspension] 16 g 0    Sig: Use 2 spray(s) in each nostril once daily     Ear, Nose, and Throat: Nasal Preparations - Corticosteroids Passed - 05/18/2020  6:25 PM      Passed - Valid encounter within last 12 months    Recent Outpatient Visits          3 months ago Centrilobular emphysema ([place])   [place], [place] T, NP   3 months ago Benign essential microscopic hematuria   [place], Pierson T, NP   4 months ago Encounter for annual physical exam   [employer], [place] T, NP   7 months ago Depression, major, single episode, moderate ([place])   [place], Jolene T, NP   8 months ago Depression, major, single episode, moderate ([place])   [place], Barbaraann Faster, NP

## 2020-06-20 ENCOUNTER — Other Ambulatory Visit: Payer: Self-pay | Admitting: Nurse Practitioner

## 2020-08-05 ENCOUNTER — Other Ambulatory Visit: Payer: Self-pay | Admitting: Nurse Practitioner

## 2020-09-17 ENCOUNTER — Encounter: Payer: Self-pay | Admitting: Nurse Practitioner

## 2020-09-17 ENCOUNTER — Ambulatory Visit (INDEPENDENT_AMBULATORY_CARE_PROVIDER_SITE_OTHER): Payer: 59 | Admitting: Nurse Practitioner

## 2020-09-17 ENCOUNTER — Other Ambulatory Visit: Payer: Self-pay

## 2020-09-17 VITALS — BP 120/84 | HR 92 | Temp 98.7°F | Ht 60.12 in | Wt 103.8 lb

## 2020-09-17 DIAGNOSIS — F321 Major depressive disorder, single episode, moderate: Secondary | ICD-10-CM

## 2020-09-17 DIAGNOSIS — K219 Gastro-esophageal reflux disease without esophagitis: Secondary | ICD-10-CM

## 2020-09-17 DIAGNOSIS — F411 Generalized anxiety disorder: Secondary | ICD-10-CM

## 2020-09-17 DIAGNOSIS — I700 Atherosclerosis of aorta: Secondary | ICD-10-CM | POA: Diagnosis not present

## 2020-09-17 DIAGNOSIS — F17210 Nicotine dependence, cigarettes, uncomplicated: Secondary | ICD-10-CM

## 2020-09-17 DIAGNOSIS — J432 Centrilobular emphysema: Secondary | ICD-10-CM

## 2020-09-17 DIAGNOSIS — R1032 Left lower quadrant pain: Secondary | ICD-10-CM

## 2020-09-17 DIAGNOSIS — L739 Follicular disorder, unspecified: Secondary | ICD-10-CM | POA: Insufficient documentation

## 2020-09-17 DIAGNOSIS — E559 Vitamin D deficiency, unspecified: Secondary | ICD-10-CM

## 2020-09-17 DIAGNOSIS — G44229 Chronic tension-type headache, not intractable: Secondary | ICD-10-CM

## 2020-09-17 DIAGNOSIS — Z1231 Encounter for screening mammogram for malignant neoplasm of breast: Secondary | ICD-10-CM

## 2020-09-17 DIAGNOSIS — G44209 Tension-type headache, unspecified, not intractable: Secondary | ICD-10-CM | POA: Insufficient documentation

## 2020-09-17 MED ORDER — TIZANIDINE HCL 4 MG PO TABS: 4.0000 mg | ORAL_TABLET | Freq: Four times a day (QID) | ORAL | 4 refills | Status: DC | PRN

## 2020-09-17 MED ORDER — AMOXICILLIN-POT CLAVULANATE 875-125 MG PO TABS: 1.0000 | ORAL_TABLET | Freq: Two times a day (BID) | ORAL | 0 refills | Status: AC

## 2020-09-17 MED ORDER — SERTRALINE HCL 100 MG PO TABS
ORAL_TABLET | ORAL | 4 refills | Status: DC
Start: 2020-09-17 — End: 2021-01-10

## 2020-09-17 NOTE — Progress Notes (Signed)
**Note De-Identified [place] Obfuscation** BP 120/84   Pulse 92   Temp 98.7 F (37.1 C) (Oral)   Ht 5' 0.12" (1.527 m)   Wt 103 lb 12.8 oz (47.1 kg)   SpO2 97%   BMI 20.19 kg/m    Subjective:    Patient ID: Haley [name], female    DOB: 12-16-1960, 60 y.o.   MRN: [id number]  HPI: Haley [name] is a 60 y.o. female  Chief Complaint  Patient presents with  . Anxiety  . Stress  . Headache  . troubles with bowels   COPD Continues on Albuterol as needed. Continues to smoke 1 PPD, does endorse needing to quit but not at this time.  Has quit cold [place] in past, but this caused irritability.  She started smoking at age 70.  She is interested in annual lung CT scan, had initial in July 2021 noting mild centrilobular emphysema and aortic atherosclerosis. COPD status: stable Satisfied with current treatment?: yes Oxygen use: no Dyspnea frequency: very rare Cough frequency: none Rescue inhaler frequency:  rarely Limitation of activity: no Productive cough:  Last Spirometry: 2017 Pneumovax: Up to Date Influenza: Up to Date   GERD Continues on Prilosec 20 MG daily, she feels she needs to take this twice a day and is doing so at home with benefit. GERD control status: stable  Satisfied with current treatment? yes Heartburn frequency: in evening if no second dose Medication side effects: no  Medication compliance: fluctuating Previous GERD medications: none Antacid use frequency:  Mylanta Dysphagia: no Odynophagia:  no Hematemesis: no Blood in stool: no EGD: yes   HEADACHES Having issues for several months with headaches.  Feeling tense and tight in neck with stress of being caregiver and clenching teeth a lot.  Not the worst headaches of her life.  Reports these are frontal in nature and even hurts to touch eyebrow area when has one, lots of tension.   Duration: months Onset: gradual Severity: 4/10 Quality: dull and aching Frequency: intermittent Location:  Headache duration: Radiation: no Time of day headache  occurs:  varies Alleviating factors: nothing Aggravating factors: unknown Headache status at time of visit: asymptomatic Treatments attempted: Treatments attempted: APAP   Aura: no Nausea:  no Vomiting: no Photophobia:  no Phonophobia:  no Effect on social functioning:  no Numbers of missed days of school/work each month:  Confusion:  no Gait disturbance/ataxia:  no Behavioral changes:  no Fevers:  no  BOWEL ISSUES Started having issues again about a few weeks back.  Reports issues similar to when last presented for diverticulitis in June 2021.  To LLQ, feels like someone is pulling there.  Has a boil down there as well that drained on own, but has returned and festered.   Has significant history of diverticulitis and abscess issues.  Has history of surgery for this in February 2019. Then in March 2019 had laparoscopic surgery due to abscess.  Had colonoscopy with Dr. Bary Castilla in 2017 noting diverticulosis of sigmoid colon.  Denies dietary indiscretions.   Duration:waxing and waning Onset: gradual Severity: 6-8/10 Quality: dull, aching, tearing and throbbing Location:  LLQ  Episode duration:  Radiation: no Frequency: intermittent Alleviating factors:  Aggravating factors: Status: stable Treatments attempted: Miralax Fever: no Nausea: none Vomiting: no Weight loss: no Decreased appetite: no Diarrhea: yes, lots  Constipation: no Blood in stool: no Heartburn: no Jaundice: no Rash: no Dysuria/urinary frequency: no Hematuria: no History of sexually transmitted disease: no Recurrent NSAID use: no  DEPRESSION Continues on Sertraline **Note De-Identified Carias Obfuscation** 150 MG + continues Vistaril as needed. 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 Is interested in increasing Sertraline to max of 200 MG, her daughter takes this.  Has history of Vitamin D deficiency, last level in June 22.8.   Continues supplement. Is worrying more often and worrying a lot in the world with everything that is going on. Mood status:stable Satisfied with current treatment?:yes Symptom severity:mild Duration of current treatment :chronic Side effects:no Medication compliance:good compliance Psychotherapy/counseling:none Previous psychiatric medications:Zoloft, Trazodone, Vistaril Depressed mood:no Anxious mood:yes Anhedonia:no Significant weight loss or gain:no Insomnia:none Fatigue:no Feelings of worthlessness or guilt:no Impaired concentration/indecisiveness:no Suicidal ideations:no Hopelessness:no Crying spells:no Depression screen [facility] 2/9 09/17/2020 01/08/2020 10/06/2019 09/15/2019 05/26/2019  Decreased Interest 3 1 1 2 2   Down, Depressed, Hopeless 2 0 0 1 1  PHQ - 2 Score 5 1 1 3 3   Altered sleeping 3 1 2 3 3   Tired, decreased energy 3 3 1  0 3  Change in appetite 1 0 0 0 3  Feeling bad or failure about yourself  0 0 0 0 0  Trouble concentrating 0 0 0 0 0  Moving slowly or fidgety/restless 0 0 0 0 0  Suicidal thoughts 0 0 0 0 0  PHQ-9 Score 12 5 4 6 12   Difficult doing work/chores - Not difficult at all Not difficult at all Not difficult at all Somewhat difficult  Some recent data might be hidden    Relevant past medical, surgical, family and social history reviewed and updated as indicated. Interim medical history since our last visit reviewed. Allergies and medications reviewed and updated.  Review of Systems  Constitutional: Negative for activity change, appetite change, diaphoresis, fatigue and fever.  Respiratory: Negative for cough, chest tightness and shortness of breath.   Cardiovascular: Negative for chest pain, palpitations and leg swelling.  Gastrointestinal: Negative.   Neurological: Negative.   Psychiatric/Behavioral: Positive for decreased concentration and sleep disturbance. Negative for self-injury and suicidal ideas. The patient is  nervous/anxious.     Per HPI unless specifically indicated above     Objective:    BP 120/84   Pulse 92   Temp 98.7 F (37.1 C) (Oral)   Ht 5' 0.12" (1.527 m)   Wt 103 lb 12.8 oz (47.1 kg)   SpO2 97%   BMI 20.19 kg/m   Wt Readings from Last 3 Encounters:  09/17/20 103 lb 12.8 oz (47.1 kg)  02/16/20 104 lb 12.8 oz (47.5 kg)  02/08/20 105 lb (47.6 kg)    Physical Exam Vitals and nursing note reviewed.  Constitutional:      General: She is awake. She is not in acute distress.    Appearance: She is well-developed and well-groomed. She is not ill-appearing.  HENT:     Head: Normocephalic.     Right Ear: Hearing normal.     Left Ear: Hearing normal.  Eyes:     General: Lids are normal.        Right eye: No discharge.        Left eye: No discharge.     Conjunctiva/sclera: Conjunctivae normal.     Pupils: Pupils are equal, round, and reactive to light.  Neck:     Thyroid: No thyromegaly.     Vascular: No carotid bruit.  Cardiovascular:     Rate and Rhythm: Normal rate and regular rhythm. **Note De-Identified Ponciano Obfuscation** Heart sounds: Normal heart sounds. No murmur heard. No gallop.   Pulmonary:     Effort: Pulmonary effort is normal. No accessory muscle usage or respiratory distress.     Breath sounds: Normal breath sounds.  Abdominal:     General: Bowel sounds are normal. There is no distension or abdominal bruit.     Palpations: Abdomen is soft. There is no hepatomegaly.     Tenderness: There is abdominal tenderness in the left lower quadrant. There is no right CVA tenderness, left CVA tenderness, guarding or rebound. Negative signs include Murphy's sign.     Hernia: No hernia is present.  Musculoskeletal:     Cervical back: Normal range of motion and neck supple.     Right lower leg: No edema.     Left lower leg: No edema.  Skin:    General: Skin is warm and dry.       Neurological:     Mental Status: She is alert and oriented to person, place, and time.     Cranial Nerves: Cranial nerves  are intact.     Motor: Motor function is intact.     Coordination: Coordination is intact.     Gait: Gait is intact.     Deep Tendon Reflexes: Reflexes are normal and symmetric.     Reflex Scores:      Brachioradialis reflexes are 2+ on the right side and 2+ on the left side.      Patellar reflexes are 2+ on the right side and 2+ on the left side. Psychiatric:        Attention and Perception: Attention normal.        Mood and Affect: Mood normal.        Behavior: Behavior normal. Behavior is cooperative.        Thought Content: Thought content normal.        Judgment: Judgment normal.     Results for orders placed or performed in visit on 02/16/20  Microscopic Examination   Urine  Result Value Ref Range   WBC, UA 0-5 0 - 5 /hpf   RBC 3-10 (A) 0 - 2 /hpf   Epithelial Cells (non renal) 0-10 0 - 10 /hpf   Bacteria, UA None seen None seen/Few  Urine Culture, Reflex   Urine  Result Value Ref Range   Urine Culture, Routine Final report (A)    Organism ID, Bacteria Comment (A)   UA/M w/rflx Culture, Routine (STAT)   Specimen: Urine   Urine  Result Value Ref Range   Specific Gravity, UA 1.010 1.005 - 1.030   pH, UA 7.0 5.0 - 7.5   Color, UA Yellow Yellow   Appearance Ur Clear Clear   Leukocytes,UA 1+ (A) Negative   Protein,UA Negative Negative/Trace   Glucose, UA Negative Negative   Ketones, UA Negative Negative   RBC, UA 1+ (A) Negative   Bilirubin, UA Negative Negative   Urobilinogen, Ur 0.2 0.2 - 1.0 mg/dL   Nitrite, UA Negative Negative   Microscopic Examination See below:    Urinalysis Reflex Comment   Comprehensive metabolic panel  Result Value Ref Range   Glucose 104 (H) 65 - 99 mg/dL   BUN 9 8 - 27 mg/dL   Creatinine, Ser 0.74 0.57 - 1.00 mg/dL   GFR calc non Af Amer 88 >59 mL/min/1.73   GFR calc Af Amer 102 >59 mL/min/1.73   BUN/Creatinine Ratio 12 12 - 28   Sodium 140 134 - 144 mmol/L **Note De-Identified [place]eds Obfuscation** Potassium 3.2 (L) 3.5 - 5.2 mmol/L   Chloride 100 96 - 106 mmol/L    CO2 18 (L) 20 - 29 mmol/L   Calcium 9.7 8.7 - 10.3 mg/dL   Total Protein 7.3 6.0 - 8.5 g/dL   Albumin 4.5 3.8 - 4.9 g/dL   Globulin, Total 2.8 1.5 - 4.5 g/dL   Albumin/Globulin Ratio 1.6 1.2 - 2.2   Bilirubin Total 0.7 0.0 - 1.2 mg/dL   Alkaline Phosphatase 129 (H) 48 - 121 IU/L   AST 19 0 - 40 IU/L   ALT 19 0 - 32 IU/L  CBC With Differential/Platelet  Result Value Ref Range   WBC 8.9 3.4 - 10.8 x10E3/uL   RBC 4.88 3.77 - 5.28 x10E6/uL   Hemoglobin 15.5 11.1 - 15.9 g/dL   Hematocrit 44.8 34.0 - 46.6 %   MCV 92 79 - 97 fL   MCH 31.8 26.6 - 33.0 pg   MCHC 34.6 31.5 - 35.7 g/dL   RDW 15.0 11.7 - 15.4 %   Platelets 319 150 - 450 x10E3/uL   Neutrophils 71 Not Estab. %   Lymphs 23 Not Estab. %   MID 6 Not Estab. %   Neutrophils Absolute 6.3 1.4 - 7.0 x10E3/uL   Lymphocytes Absolute 2.1 0.7 - 3.1 x10E3/uL   MID (Absolute) 0.5 0.1 - 1.6 X10E3/uL      Assessment & Plan:   Problem List Items Addressed This Visit      Cardiovascular and Mediastinum   Aortic atherosclerosis ([place])    Noted on lung screening in July 2021.  Will continue to recommend initiation of statin and daily baby ASA.  Recommend complete cessation of smoking.        Respiratory   Centrilobular emphysema ([facility]) - Primary    Chronic, stable. Continue Albuterol inhaler PRN and Singulair daily.  Adjust inhaler regimen as needed, consider maintenance if worsening.  Consider spirometry at next visit.  No recent exacerbations.        Digestive   GERD (gastroesophageal reflux disease)    Chronic, ongoing.  Continue Prilosec BID dosing.  Recommend heavy focus on diet and keeping food journal, eliminating foods that cause discomfort.  Mag level today.      Relevant Orders   Magnesium     Musculoskeletal and Integument   Folliculitis    To left pelvic area, area has drained already.  Continues to have some s/s infection.  Will send in Augmentin which will benefit both abdominal pain and folliculitis.  Recommend warm  compresses applied to area 3 times a day and may take Tylenol as needed.  Return for worsening or ongoing symptoms.        Other   Vitamin D deficiency disease    Chronic, ongoing.  Continue daily supplement and recheck Vit D level today.  Adjust as needed.  Next DEXA at age 43.      Relevant Orders   VITAMIN D 25 Hydroxy (Vit-D Deficiency, Fractures)   Generalized anxiety disorder    Chronic, ongoing.  Denies SI/HI.  Will increase Sertraline to 200 MG daily + continue Vistaril as needed (change to Buspar in future).  This benefits both anxiety and depression elements. Return to office in 6 months.      Relevant Medications   sertraline (ZOLOFT) 100 MG tablet   Depression, major, single episode, moderate (HCC)    Chronic, ongoing.  Denies SI/HI.  Will increase Sertraline to 200 MG, as has some increased anxiety as caregiver.  This **Note De-Identified Luka Obfuscation** benefits both anxiety and depression elements.  Continue Vistaril as needed, consider change to Buspar in future.   Follow-up in 6 months.      Relevant Medications   sertraline (ZOLOFT) 100 MG tablet   Left lower quadrant abdominal pain    Acute with waxing and waning.  Is tender to LLQ and has significant history of diverticulitis and abscess issues.  Obtain CBC and CMP.  Has poor diet adherence, recommend heavy focus on reducing foods that cause flares.  Due to past history will treat at this time with Augmentin, script sent.  Return in 14 days for follow-up, sooner if worsening issues.  Recommend she follow-up with Dr. Bary Castilla as well.      Relevant Orders   CBC with Differential/Platelet   Comprehensive metabolic panel   TSH   Cigarette nicotine dependence without complication    I have recommended complete cessation of tobacco use. I have discussed various options available for assistance with tobacco cessation including over the counter methods (Nicotine gum, patch and lozenges). We also discussed prescription options (Chantix, Nicotine Inhaler /  Nasal Spray). The patient is not interested in pursuing any prescription tobacco cessation options at this time. Continue yearly lung CT screening.       Tension-type headache    Ongoing issue with increased stressors as caregiver.  Has tension in neck and frontal aspect head.  Will refill Tizanidine to take as needed and recommend use of Tylenol as needed.  If worsening please return to office and may adjust regimen.        Relevant Medications   sertraline (ZOLOFT) 100 MG tablet   tiZANidine (ZANAFLEX) 4 MG tablet    Other Visit Diagnoses    Encounter for screening mammogram for malignant neoplasm of breast       Mammogram ordered   Relevant Orders   MM 3D SCREEN BREAST BILATERAL       Follow up plan: Return in about 2 weeks (around 10/01/2020) for Diverticulitis.

## 2020-09-17 NOTE — Assessment & Plan Note (Signed)
**Note De-Identified Spangler Obfuscation** Chronic, ongoing.  Continue Prilosec BID dosing.  Recommend heavy focus on diet and keeping food journal, eliminating foods that cause discomfort.  Mag level today.

## 2020-09-17 NOTE — Assessment & Plan Note (Signed)
**Note De-identified Hass Obfuscation** Chronic, ongoing.  Continue daily supplement and recheck Vit D level today.  Adjust as needed.  Next DEXA at age 60. 

## 2020-09-17 NOTE — Assessment & Plan Note (Signed)
**Note De-Identified Brandenberger Obfuscation** Chronic, stable. Continue Albuterol inhaler PRN and Singulair daily.  Adjust inhaler regimen as needed, consider maintenance if worsening.  Consider spirometry at next visit.  No recent exacerbations.

## 2020-09-17 NOTE — Assessment & Plan Note (Signed)
**Note De-Identified P[place]y Obfuscation** Acute with waxing and waning.  Is tender to LLQ and has significant history of diverticulitis and abscess issues.  Obtain CBC and CMP.  Has poor diet adherence, recommend heavy focus on reducing foods that cause flares.  Due to past history will treat at this time with Augmentin, script sent.  Return in 14 days for follow-up, sooner if worsening issues.  Recommend she follow-up with Dr. Bary Castilla as well.

## 2020-09-17 NOTE — Patient Instructions (Signed)
**Note De-identified Schwall Obfuscation** Diverticulitis  Diverticulitis is when small pouches in your colon (large intestine) get infected or swollen. This causes pain in the belly (abdomen) and watery poop (diarrhea). These pouches are called diverticula. The pouches form in people who have a condition called diverticulosis. What are the causes? This condition may be caused by poop (stool) that gets trapped in the pouches in your colon. The poop lets germs (bacteria) grow in the pouches. This causes the infection. What increases the risk? You are more likely to get this condition if you have small pouches in your colon. The risk is higher if:  You are overweight or very overweight (obese).  You do not exercise enough.  You drink alcohol.  You smoke or use products with tobacco in them.  You eat a diet that has a lot of red meat such as beef, pork, or lamb.  You eat a diet that does not have enough fiber in it.  You are older than 61 years of age. What are the signs or symptoms?  Pain in the belly. Pain is often on the left side, but it may be in other areas.  Fever and feeling cold.  Feeling like you may vomit.  Vomiting.  Having cramps.  Feeling full.  Changes to how often you poop.  Blood in your poop. How is this treated? Most cases are treated at home by:  Taking over-the-counter pain medicines.  Following a clear liquid diet.  Taking antibiotic medicines.  Resting. Very bad cases may need to be treated at a hospital. This may include:  Not eating or drinking.  Taking prescription pain medicine.  Getting antibiotic medicines through an IV tube.  Getting fluid and food through an IV tube.  Having surgery. When you are feeling better, your doctor may tell you to have a test to check your colon (colonoscopy). Follow these instructions at home: Medicines  Take over-the-counter and prescription medicines only as told by your doctor. These include: ? Antibiotics. ? Pain medicines. ? Fiber  pills. ? Probiotics. ? Stool softeners.  If you were prescribed an antibiotic medicine, take it as told by your doctor. Do not stop taking the antibiotic even if you start to feel better.  Ask your doctor if the medicine prescribed to you requires you to avoid driving or using machinery. Eating and drinking  Follow a diet as told by your doctor.  When you feel better, your doctor may tell you to change your diet. You may need to eat a lot of fiber. Fiber makes it easier to poop (have a bowel movement). Foods with fiber include: ? Berries. ? Beans. ? Lentils. ? Green vegetables.  Avoid eating red meat.   General instructions  Do not use any products that contain nicotine or tobacco, such as cigarettes, e-cigarettes, and chewing tobacco. If you need help quitting, ask your doctor.  Exercise 3 or more times a week. Try to get 30 minutes each time. Exercise enough to sweat and make your heart beat faster.  Keep all follow-up visits as told by your doctor. This is important. Contact a doctor if:  Your pain does not get better.  You are not pooping like normal. Get help right away if:  Your pain gets worse.  Your symptoms do not get better.  Your symptoms get worse very fast.  You have a fever.  You vomit more than one time.  You have poop that is: ? Bloody. ? Black. ? Tarry. Summary  This condition happens when  **Note De-identified Cashen Obfuscation** small pouches in your colon get infected or swollen.  Take medicines only as told by your doctor.  Follow a diet as told by your doctor.  Keep all follow-up visits as told by your doctor. This is important. This information is not intended to replace advice given to you by your health care provider. Make sure you discuss any questions you have with your health care provider. Document Revised: 05/01/2019 Document Reviewed: 05/01/2019 Elsevier Patient Education  2021 [employer]vier Inc.  

## 2020-09-17 NOTE — Assessment & Plan Note (Signed)
**Note De-Identified Abdelrahman Obfuscation** I have recommended complete cessation of tobacco use. I have discussed various options available for assistance with tobacco cessation including over the counter methods (Nicotine gum, patch and lozenges). We also discussed prescription options (Chantix, Nicotine Inhaler / Nasal Spray). The patient is not interested in pursuing any prescription tobacco cessation options at this time. Continue yearly lung CT screening.

## 2020-09-17 NOTE — Assessment & Plan Note (Signed)
**Note De-Identified Bratz Obfuscation** Chronic, ongoing.  Denies SI/HI.  Will increase Sertraline to 200 MG daily + continue Vistaril as needed (change to Buspar in future).  This benefits both anxiety and depression elements. Return to office in 6 months.

## 2020-09-17 NOTE — Assessment & Plan Note (Signed)
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  Has tension in neck and frontal aspect head.  Will refill Tizanidine to take as needed and recommend use of Tylenol as needed.  If worsening please return to office and may adjust regimen.

## 2020-09-17 NOTE — Assessment & Plan Note (Signed)
**Note De-Identified Botten[place] Obfuscation** Chronic, ongoing.  Denies SI/HI.  Will increase Sertraline to 200 MG, as has some increased anxiety as caregiver.  This benefits both anxiety and depression elements.  Continue Vistaril as needed, consider change to Buspar in future.   Follow-up in 6 months.

## 2020-09-17 NOTE — Assessment & Plan Note (Signed)
**Note De-Identified Freitas Obfuscation** Noted on lung screening in July 2021.  Will continue to recommend initiation of statin and daily baby ASA.  Recommend complete cessation of smoking.

## 2020-09-17 NOTE — Assessment & Plan Note (Signed)
**Note De-Identified Erazo Obfuscation** To left pelvic area, area has drained already.  Continues to have some s/s infection.  Will send in Augmentin which will benefit both abdominal pain and folliculitis.  Recommend warm compresses applied to area 3 times a day and may take Tylenol as needed.  Return for worsening or ongoing symptoms.

## 2020-09-18 LAB — CBC WITH DIFFERENTIAL/PLATELET
Basophils Absolute: 0.1 10*3/uL (ref 0.0–0.2)
Basos: 2 %
EOS (ABSOLUTE): 0.2 10*3/uL (ref 0.0–0.4)
Eos: 3 %
Hematocrit: 42.3 % (ref 34.0–46.6)
Hemoglobin: 14.3 g/dL (ref 11.1–15.9)
Immature Grans (Abs): 0 10*3/uL (ref 0.0–0.1)
Immature Granulocytes: 0 %
Lymphocytes Absolute: 2.2 10*3/uL (ref 0.7–3.1)
Lymphs: 37 %
MCH: 32.6 pg (ref 26.6–33.0)
MCHC: 33.8 g/dL (ref 31.5–35.7)
MCV: 96 fL (ref 79–97)
Monocytes Absolute: 0.4 10*3/uL (ref 0.1–0.9)
Monocytes: 6 %
Neutrophils Absolute: 3.1 10*3/uL (ref 1.4–7.0)
Neutrophils: 52 %
Platelets: 343 10*3/uL (ref 150–450)
RBC: 4.39 x10E6/uL (ref 3.77–5.28)
RDW: 13.1 % (ref 11.7–15.4)
WBC: 6 10*3/uL (ref 3.4–10.8)

## 2020-09-18 LAB — COMPREHENSIVE METABOLIC PANEL
ALT: 22 IU/L (ref 0–32)
AST: 31 IU/L (ref 0–40)
Albumin/Globulin Ratio: 1.6 (ref 1.2–2.2)
Albumin: 4.5 g/dL (ref 3.8–4.8)
Alkaline Phosphatase: 139 IU/L — ABNORMAL HIGH (ref 44–121)
BUN/Creatinine Ratio: 17 (ref 12–28)
BUN: 14 mg/dL (ref 8–27)
Bilirubin Total: 0.5 mg/dL (ref 0.0–1.2)
CO2: 22 mmol/L (ref 20–29)
Calcium: 10.3 mg/dL (ref 8.7–10.3)
Chloride: 102 mmol/L (ref 96–106)
Creatinine, Ser: 0.81 mg/dL (ref 0.57–1.00)
GFR calc Af Amer: 91 mL/min/{1.73_m2} (ref >59)
GFR calc non Af Amer: 79 mL/min/{1.73_m2} (ref >59)
Globulin, Total: 2.9 g/dL (ref 1.5–4.5)
Glucose: 114 mg/dL — ABNORMAL HIGH (ref 65–99)
Potassium: 4 mmol/L (ref 3.5–5.2)
Sodium: 140 mmol/L (ref 134–144)
Total Protein: 7.4 g/dL (ref 6.0–8.5)

## 2020-09-18 LAB — TSH: TSH: 0.671 u[IU]/mL (ref 0.450–4.500)

## 2020-09-18 LAB — VITAMIN D 25 HYDROXY (VIT D DEFICIENCY, FRACTURES): Vit D, 25-Hydroxy: 26.8 ng/mL — ABNORMAL LOW (ref 30.0–100.0)

## 2020-09-18 LAB — MAGNESIUM: Magnesium: 1.7 mg/dL (ref 1.6–2.3)

## 2020-09-18 NOTE — Progress Notes (Signed)
**Note De-Identified Coin Obfuscation** Contacted Haley [name] afternoon Haley [name], your labs have returned.  Overall they are remaining stable -- CBC shows no major infection.  Kidney and liver function are normal.  Thyroid normal.  Vitamin D on lower side, please ensure you take Vitamin D3 2000 units daily.  Magnesium level is normal.  Do you still have your gall bladder?  Any questions? Keep being awesome!!  Thank you for allowing me to participate in your care. Kindest regards, Alecxis Baltzell

## 2020-10-04 ENCOUNTER — Encounter: Payer: Self-pay | Admitting: Nurse Practitioner

## 2020-10-04 ENCOUNTER — Other Ambulatory Visit: Payer: Self-pay

## 2020-10-04 ENCOUNTER — Ambulatory Visit (INDEPENDENT_AMBULATORY_CARE_PROVIDER_SITE_OTHER): Payer: 59 | Admitting: Nurse Practitioner

## 2020-10-04 DIAGNOSIS — R1032 Left lower quadrant pain: Secondary | ICD-10-CM | POA: Diagnosis not present

## 2020-10-04 NOTE — Assessment & Plan Note (Signed)
**Note De-Identified Gautney Obfuscation** Acute and improved at this time.  No further intervention needed.  Return to office for any symptom return.

## 2020-10-04 NOTE — Progress Notes (Signed)
**Note De-Identified [place] Obfuscation** BP 123/85   Pulse 85   Temp 98.7 F (37.1 C) (Oral)   Wt 102 lb 12.8 oz (46.6 kg)   SpO2 98%   BMI 20.00 kg/m    Subjective:    Patient ID: Haley [name], female    DOB: 16-Oct-1960, 60 y.o.   MRN: [id number]  HPI: Haley [name] is a 60 y.o. female  Chief Complaint  Patient presents with  . Diverticulitis    Patient states she feeling better now and states she thinks it has healed up for now.   DIVERTICULITIS Follow-up for recent treatment of diverticulitis on 09/17/20 with Augmentin.  She reports feeling a lot better today -- abdominal pain has improved.  Has significant history of diverticulitis and abscess issues.  Has history of surgery for this in February 2019. Then in March 2019 had laparoscopic surgery due to abscess.  Had colonoscopy with Dr. Bary Castilla in 2017 noting diverticulosis of sigmoid colon.  Denies dietary indiscretions.   Duration:waxing and waning Onset: gradual Severity: 0/10 Radiation: no Frequency: intermittent Alleviating factors:  Aggravating factors: Status: stable Treatments attempted: Miralax Fever: no Nausea: none Vomiting: no Weight loss: no Decreased appetite: no Diarrhea: yes, lots  Constipation: no Blood in stool: no Heartburn: no Jaundice: no Rash: no Dysuria/urinary frequency: no Hematuria: no History of sexually transmitted disease: no Recurrent NSAID use: no  Relevant past medical, surgical, family and social history reviewed and updated as indicated. Interim medical history since our last visit reviewed. Allergies and medications reviewed and updated.  Review of Systems  Constitutional: Negative for activity change, appetite change, diaphoresis, fatigue and fever.  Respiratory: Negative for cough, chest tightness and shortness of breath.   Cardiovascular: Negative for chest pain, palpitations and leg swelling.  Gastrointestinal: Negative.   Neurological: Negative.   Psychiatric/Behavioral: Negative.     Per HPI unless  specifically indicated above     Objective:    BP 123/85   Pulse 85   Temp 98.7 F (37.1 C) (Oral)   Wt 102 lb 12.8 oz (46.6 kg)   SpO2 98%   BMI 20.00 kg/m   Wt Readings from Last 3 Encounters:  10/04/20 102 lb 12.8 oz (46.6 kg)  09/17/20 103 lb 12.8 oz (47.1 kg)  02/16/20 104 lb 12.8 oz (47.5 kg)    Physical Exam Vitals and nursing note reviewed.  Constitutional:      General: She is awake. She is not in acute distress.    Appearance: She is well-developed and well-groomed. She is not ill-appearing.  HENT:     Head: Normocephalic.     Right Ear: Hearing normal.     Left Ear: Hearing normal.  Eyes:     General: Lids are normal.        Right eye: No discharge.        Left eye: No discharge.     Conjunctiva/sclera: Conjunctivae normal.     Pupils: Pupils are equal, round, and reactive to light.  Neck:     Thyroid: No thyromegaly.     Vascular: No carotid bruit.  Cardiovascular:     Rate and Rhythm: Normal rate and regular rhythm.     Heart sounds: Normal heart sounds. No murmur heard. No gallop.   Pulmonary:     Effort: Pulmonary effort is normal. No accessory muscle usage or respiratory distress.     Breath sounds: Normal breath sounds.  Abdominal:     General: Bowel sounds are normal. There is no distension or abdominal **Note De-Identified [place] Obfuscation** bruit.     Palpations: Abdomen is soft. There is no hepatomegaly.     Tenderness: There is no abdominal tenderness. There is no right CVA tenderness, left CVA tenderness, guarding or rebound. Negative signs include Murphy's sign.     Hernia: No hernia is present.  Musculoskeletal:     Cervical back: Normal range of motion and neck supple.     Right lower leg: No edema.     Left lower leg: No edema.  Skin:    General: Skin is warm and dry.  Neurological:     Mental Status: She is alert and oriented to person, place, and time.     Cranial Nerves: Cranial nerves are intact.     Motor: Motor function is intact.     Coordination: Coordination is  intact.     Gait: Gait is intact.     Deep Tendon Reflexes: Reflexes are normal and symmetric.     Reflex Scores:      Brachioradialis reflexes are 2+ on the right side and 2+ on the left side.      Patellar reflexes are 2+ on the right side and 2+ on the left side. Psychiatric:        Attention and Perception: Attention normal.        Mood and Affect: Mood normal.        Behavior: Behavior normal. Behavior is cooperative.        Thought Content: Thought content normal.        Judgment: Judgment normal.    Results for orders placed or performed in visit on 09/17/20  CBC with Differential/Platelet  Result Value Ref Range   WBC 6.0 3.4 - 10.8 x10E3/uL   RBC 4.39 3.77 - 5.28 x10E6/uL   Hemoglobin 14.3 11.1 - 15.9 g/dL   Hematocrit 42.3 34.0 - 46.6 %   MCV 96 79 - 97 fL   MCH 32.6 26.6 - 33.0 pg   MCHC 33.8 31.5 - 35.7 g/dL   RDW 13.1 11.7 - 15.4 %   Platelets 343 150 - 450 x10E3/uL   Neutrophils 52 Not Estab. %   Lymphs 37 Not Estab. %   Monocytes 6 Not Estab. %   Eos 3 Not Estab. %   Basos 2 Not Estab. %   Neutrophils Absolute 3.1 1.4 - 7.0 x10E3/uL   Lymphocytes Absolute 2.2 0.7 - 3.1 x10E3/uL   Monocytes Absolute 0.4 0.1 - 0.9 x10E3/uL   EOS (ABSOLUTE) 0.2 0.0 - 0.4 x10E3/uL   Basophils Absolute 0.1 0.0 - 0.2 x10E3/uL   Immature Granulocytes 0 Not Estab. %   Immature Grans (Abs) 0.0 0.0 - 0.1 x10E3/uL  Comprehensive metabolic panel  Result Value Ref Range   Glucose 114 (H) 65 - 99 mg/dL   BUN 14 8 - 27 mg/dL   Creatinine, Ser 0.81 0.57 - 1.00 mg/dL   GFR calc non Af Amer 79 >59 mL/min/1.73   GFR calc Af Amer 91 >59 mL/min/1.73   BUN/Creatinine Ratio 17 12 - 28   Sodium 140 134 - 144 mmol/L   Potassium 4.0 3.5 - 5.2 mmol/L   Chloride 102 96 - 106 mmol/L   CO2 22 20 - 29 mmol/L   Calcium 10.3 8.7 - 10.3 mg/dL   Total Protein 7.4 6.0 - 8.5 g/dL   Albumin 4.5 3.8 - 4.8 g/dL   Globulin, Total 2.9 1.5 - 4.5 g/dL   Albumin/Globulin Ratio 1.6 1.2 - 2.2   Bilirubin Total  0.5 0.0 - 1.2 **Note De-Identified [place]iger Obfuscation** mg/dL   Alkaline Phosphatase 139 (H) 44 - 121 IU/L   AST 31 0 - 40 IU/L   ALT 22 0 - 32 IU/L  TSH  Result Value Ref Range   TSH 0.671 0.450 - 4.500 uIU/mL  VITAMIN D 25 Hydroxy (Vit-D Deficiency, Fractures)  Result Value Ref Range   Vit D, 25-Hydroxy 26.8 (L) 30.0 - 100.0 ng/mL  Magnesium  Result Value Ref Range   Magnesium 1.7 1.6 - 2.3 mg/dL      Assessment & Plan:   Problem List Items Addressed This Visit      Other   Left lower quadrant abdominal pain    Acute and improved at this time.  No further intervention needed.  Return to office for any symptom return.          Follow up plan: Return in about 3 months (around 01/08/2021) for Annual physical -- with spirometry.

## 2020-10-04 NOTE — Patient Instructions (Signed)
**Note De-identified [place] Obfuscation** Diverticulitis  Diverticulitis is when small pouches in your colon (large intestine) get infected or swollen. This causes pain in the belly (abdomen) and watery poop (diarrhea). These pouches are called diverticula. The pouches form in people who have a condition called diverticulosis. What are the causes? This condition may be caused by poop (stool) that gets trapped in the pouches in your colon. The poop lets germs (bacteria) grow in the pouches. This causes the infection. What increases the risk? You are more likely to get this condition if you have small pouches in your colon. The risk is higher if:  You are overweight or very overweight (obese).  You do not exercise enough.  You drink alcohol.  You smoke or use products with tobacco in them.  You eat a diet that has a lot of red meat such as beef, pork, or lamb.  You eat a diet that does not have enough fiber in it.  You are older than 61 years of age. What are the signs or symptoms?  Pain in the belly. Pain is often on the left side, but it may be in other areas.  Fever and feeling cold.  Feeling like you may vomit.  Vomiting.  Having cramps.  Feeling full.  Changes to how often you poop.  Blood in your poop. How is this treated? Most cases are treated at home by:  Taking over-the-counter pain medicines.  Following a clear liquid diet.  Taking antibiotic medicines.  Resting. Very bad cases may need to be treated at a hospital. This may include:  Not eating or drinking.  Taking prescription pain medicine.  Getting antibiotic medicines through an IV tube.  Getting fluid and food through an IV tube.  Having surgery. When you are feeling better, your doctor may tell you to have a test to check your colon (colonoscopy). Follow these instructions at home: Medicines  Take over-the-counter and prescription medicines only as told by your doctor. These include: ? Antibiotics. ? Pain medicines. ? Fiber  pills. ? Probiotics. ? Stool softeners.  If you were prescribed an antibiotic medicine, take it as told by your doctor. Do not stop taking the antibiotic even if you start to feel better.  Ask your doctor if the medicine prescribed to you requires you to avoid driving or using machinery. Eating and drinking  Follow a diet as told by your doctor.  When you feel better, your doctor may tell you to change your diet. You may need to eat a lot of fiber. Fiber makes it easier to poop (have a bowel movement). Foods with fiber include: ? Berries. ? Beans. ? Lentils. ? Green vegetables.  Avoid eating red meat.   General instructions  Do not use any products that contain nicotine or tobacco, such as cigarettes, e-cigarettes, and chewing tobacco. If you need help quitting, ask your doctor.  Exercise 3 or more times a week. Try to get 30 minutes each time. Exercise enough to sweat and make your heart beat faster.  Keep all follow-up visits as told by your doctor. This is important. Contact a doctor if:  Your pain does not get better.  You are not pooping like normal. Get help right away if:  Your pain gets worse.  Your symptoms do not get better.  Your symptoms get worse very fast.  You have a fever.  You vomit more than one time.  You have poop that is: ? Bloody. ? Black. ? Tarry. Summary  This condition happens when  **Note De-identified Riggle Obfuscation** small pouches in your colon get infected or swollen.  Take medicines only as told by your doctor.  Follow a diet as told by your doctor.  Keep all follow-up visits as told by your doctor. This is important. This information is not intended to replace advice given to you by your health care provider. Make sure you discuss any questions you have with your health care provider. Document Revised: 05/01/2019 Document Reviewed: 05/01/2019 Elsevier Patient Education  2021 [employer] Inc.  

## 2020-10-28 ENCOUNTER — Other Ambulatory Visit: Payer: Self-pay | Admitting: Nurse Practitioner

## 2020-12-14 ENCOUNTER — Other Ambulatory Visit: Payer: Self-pay | Admitting: Nurse Practitioner

## 2021-01-10 ENCOUNTER — Other Ambulatory Visit: Payer: Self-pay

## 2021-01-10 ENCOUNTER — Encounter: Payer: Self-pay | Admitting: Nurse Practitioner

## 2021-01-10 ENCOUNTER — Ambulatory Visit (INDEPENDENT_AMBULATORY_CARE_PROVIDER_SITE_OTHER): Payer: 59 | Admitting: Nurse Practitioner

## 2021-01-10 VITALS — BP 137/88 | HR 73 | Temp 98.8°F | Ht 60.08 in | Wt 100.4 lb

## 2021-01-10 DIAGNOSIS — Z7689 Persons encountering health services in other specified circumstances: Secondary | ICD-10-CM

## 2021-01-10 DIAGNOSIS — Z1159 Encounter for screening for other viral diseases: Secondary | ICD-10-CM

## 2021-01-10 DIAGNOSIS — Z0000 Encounter for general adult medical examination without abnormal findings: Secondary | ICD-10-CM

## 2021-01-10 DIAGNOSIS — K219 Gastro-esophageal reflux disease without esophagitis: Secondary | ICD-10-CM

## 2021-01-10 DIAGNOSIS — F411 Generalized anxiety disorder: Secondary | ICD-10-CM

## 2021-01-10 DIAGNOSIS — L989 Disorder of the skin and subcutaneous tissue, unspecified: Secondary | ICD-10-CM | POA: Insufficient documentation

## 2021-01-10 DIAGNOSIS — I700 Atherosclerosis of aorta: Secondary | ICD-10-CM | POA: Diagnosis not present

## 2021-01-10 DIAGNOSIS — E7800 Pure hypercholesterolemia, unspecified: Secondary | ICD-10-CM

## 2021-01-10 DIAGNOSIS — F17210 Nicotine dependence, cigarettes, uncomplicated: Secondary | ICD-10-CM

## 2021-01-10 DIAGNOSIS — J432 Centrilobular emphysema: Secondary | ICD-10-CM

## 2021-01-10 DIAGNOSIS — F321 Major depressive disorder, single episode, moderate: Secondary | ICD-10-CM

## 2021-01-10 DIAGNOSIS — J3089 Other allergic rhinitis: Secondary | ICD-10-CM

## 2021-01-10 DIAGNOSIS — E782 Mixed hyperlipidemia: Secondary | ICD-10-CM | POA: Insufficient documentation

## 2021-01-10 DIAGNOSIS — E559 Vitamin D deficiency, unspecified: Secondary | ICD-10-CM

## 2021-01-10 DIAGNOSIS — F5101 Primary insomnia: Secondary | ICD-10-CM

## 2021-01-10 DIAGNOSIS — Z23 Encounter for immunization: Secondary | ICD-10-CM

## 2021-01-10 MED ORDER — OMEPRAZOLE 20 MG PO CPDR: 20.0000 mg | DELAYED_RELEASE_CAPSULE | Freq: Two times a day (BID) | ORAL | 4 refills | Status: DC

## 2021-01-10 MED ORDER — MONTELUKAST SODIUM 10 MG PO TABS: 1.0000 | ORAL_TABLET | Freq: Every day | ORAL | 4 refills | Status: DC

## 2021-01-10 MED ORDER — SERTRALINE HCL 100 MG PO TABS: ORAL_TABLET | ORAL | 4 refills | Status: DC

## 2021-01-10 MED ORDER — SHINGRIX 50 MCG/0.5ML IM SUSR: 0.5000 mL | Freq: Once | INTRAMUSCULAR | 0 refills | Status: AC

## 2021-01-10 MED ORDER — TRAZODONE HCL 50 MG PO TABS: 50.0000 mg | ORAL_TABLET | Freq: Every day | ORAL | 12 refills | Status: DC

## 2021-01-10 NOTE — Patient Instructions (Signed)
**Note De-identified [place] Obfuscation** [facility]  Address: [address], [place], [place] [address]  Phone: [phone]  Mammogram A mammogram is an X-ray of the breasts. This is done to check for changes that are not normal. This test can look for changes that may be caused by breast cancer or other problems. Mammograms are regularly done on women beginning at age 61. A man may have a mammogram if he has a lump or swelling in his breast. Tell a doctor: About any allergies you have. If you have breast implants. If you have had breast disease, biopsy, or surgery. If you have a family history of breast cancer. If you are breastfeeding. Whether you are pregnant or may be pregnant. What are the risks? Generally, this is a safe procedure. But problems may occur, including: Being exposed to radiation. Radiation levels are very low with this test. The need for more tests. The results were not read properly. Trouble finding breast cancer in women with dense breasts. What happens before the test? Have this test done about 1-2 weeks after your menstrual period. This is often when your breasts are the least tender. If you are visiting a new doctor or clinic, have any past mammogram images sent to your new doctor's office. Wash your breasts and under your arms on the day of the test. Do not use deodorants, perfumes, lotions, or powders on the day of the test. Take off any jewelry from your neck. Wear clothes that you can change into and out of easily. What happens during the test?  You will take off your clothes from the waist up. You will put on a gown. You will stand in front of the X-ray machine. Each breast will be placed between two plastic or glass plates. The plates will press down on your breast for a few seconds. Try to relax. This does not cause any harm to your breasts. It may not feel comfortable, but it will be very brief. X-rays will be taken from different angles of each  breast. The procedure may vary among doctors and hospitals. What can I expect after the test? The mammogram will be read by a specialist (radiologist). You may need to do parts of the test again. This depends on the quality of the images. You may go back to your normal activities. It is up to you to get the results of your test. Ask how to get your results when they are ready. Summary A mammogram is an X-ray of the breasts. It looks for changes that may be caused by breast cancer or other problems. A man may have this test if he has a lump or swelling in his breast. Before the test, tell your doctor about any breast problems that you have had in the past. Have this test done about 1-2 weeks after your menstrual period. Ask when your test results will be ready. Make sure you get your test results. This information is not intended to replace advice given to you by your health care provider. Make sure you discuss any questions you have with your health care provider. Document Revised: 05/20/2020 Document Reviewed: 05/20/2020 Elsevier Patient Education  2022 [employer] Inc.   

## 2021-01-10 NOTE — Assessment & Plan Note (Signed)
**Note De-Identified Guerrini Obfuscation** Noted on recent labs, ASCVD 8.5%.  At this time continue to focus on diet changes and regular activity.  Lipid panel today and continue to recommend initiation of statin.

## 2021-01-10 NOTE — Assessment & Plan Note (Signed)
**Note De-identified [place]ott Obfuscation** Chronic, ongoing.  Continue daily supplement and recheck Vit D level today.  Adjust as needed.  Next DEXA at age 60. 

## 2021-01-10 NOTE — Progress Notes (Signed)
**Note De-Identified J[place] Obfuscation** BP 137/88   Pulse 73   Temp 98.8 F (37.1 C) (Oral)   Ht 5' 0.08" (1.526 m)   Wt 100 lb 6.4 oz (45.5 kg)   SpO2 98%   BMI 19.56 kg/m    Subjective:    Patient ID: Haley [name], female    DOB: 1961/06/10, 60 y.o.   MRN: [id number]  HPI: Haley [name] is a 60 y.o. female presenting on 01/10/2021 for comprehensive medical examination. Current medical complaints include: none  She currently lives with: mother Menopausal Symptoms: no   The 10-year ASCVD risk score Mikey Bussing DC Brooke Bonito., et al., 2013) is: 8.5%   Values used to calculate the score:     Age: 89 years     Sex: Female     Is Non-Hispanic African American: No     Diabetic: No     Tobacco smoker: Yes     Systolic Blood Pressure: 106 mmHg     Is BP treated: No     HDL Cholesterol: 69 mg/dL     Total Cholesterol: 244 mg/dL   COPD Continues on Albuterol as needed + Flonase and Singulair. Continues to smoke 1 PPD, does endorse needing to quit but not at this time.  Has quit cold [place] in past, but this caused irritability.  She started smoking at age 68.  Had initial annual lung screening on 02/08/2020 -- this noted aortic atherosclerosis and emphysema. COPD status: stable Satisfied with current treatment?: yes Oxygen use: no Dyspnea frequency: very rare Cough frequency: none Rescue inhaler frequency:  rarely Limitation of activity: no Productive cough:  Last Spirometry: 06/16/2016 Pneumovax: Up to Date Influenza: Up to Date   GERD Continues on Prilosec 20 MG BID, she feels she needs to take this twice a day and is doing so at home with benefit. GERD control status: stable  Satisfied with current treatment? yes Heartburn frequency: in evening if no second dose Medication side effects: no  Medication compliance: fluctuating Previous GERD medications: none Antacid use frequency:  Mylanta Dysphagia: no Odynophagia:  no Hematemesis: no Blood in stool: no EGD: yes  DEPRESSION Continues on Sertraline 200 MG + continues  Vistaril as needed. Takes Trazodone 50 MG for sleep.  Reports winter time is worst period.  Has history of Vitamin D deficiency, recent level 26.8  Continues supplement. Mood status: stable Satisfied with current treatment?: yes Symptom severity: mild  Duration of current treatment : chronic Side effects: no Medication compliance: good compliance Psychotherapy/counseling: none Previous psychiatric medications: Zoloft, Trazodone, Vistaril Depressed mood: no Anxious mood: no Anhedonia: no Significant weight loss or gain: no Insomnia: none Fatigue: no Feelings of worthlessness or guilt: no Impaired concentration/indecisiveness: no Suicidal ideations: no Hopelessness: no Crying spells: no Depression screen [facility] 2/9 01/10/2021 09/17/2020 01/08/2020 10/06/2019 09/15/2019  Decreased Interest 0 3 1 1 2   Down, Depressed, Hopeless 0 2 0 0 1  PHQ - 2 Score 0 5 1 1 3   Altered sleeping 1 3 1 2 3   Tired, decreased energy 0 3 3 1  0  Change in appetite 0 1 0 0 0  Feeling bad or failure about yourself  0 0 0 0 0  Trouble concentrating 0 0 0 0 0  Moving slowly or fidgety/restless 0 0 0 0 0  Suicidal thoughts 0 0 0 0 0  PHQ-9 Score 1 12 5 4 6   Difficult doing work/chores Not difficult at all - Not difficult at all Not difficult at all Not difficult at all **Note De-Identified Plude Obfuscation** Some recent data might be hidden    The patient does not have a history of falls. I did not complete a risk assessment for falls. A plan of care for falls was not documented.   Past Medical History:  Past Medical History:  Diagnosis Date   Abdominal pain 04/29/2015   Acute diverticulitis of intestine 03/18/2017   Acute pancreatitis 01/13/2017   Acute sinusitis 09/01/2015   Anemia    Anxiety    Benign neoplasm of sigmoid colon    Breast cancer screening 07/08/2015   CHF (congestive heart failure) (HCC)    while hospitalized with pneumonia   Chronic pain syndrome    COPD (chronic obstructive pulmonary disease) (HCC)    while hospitalized    Cyclic vomiting syndrome    Cyclical vomiting    Diverticulitis of large intestine with abscess without bleeding    Dysphagia    GERD (gastroesophageal reflux disease)    controlled   Hematuria, microscopic 04/26/2015   Urine Sept 2016; rechecked still present; refer to urologist; hx of "polyps" in bladder 25 years ago (1991-ish)    History of pneumonia    History of sepsis    Hypertension    controlled on meds   Hypokalemia 04/24/2015   IBS (irritable bowel syndrome) 02/02/2017   Leukocytosis    Low serum potassium 01/13/2017   Marital stress 07/12/2015   Separated from husband Fall 2016    Moderate COPD (chronic obstructive pulmonary disease) ([place])    Osteopenia Nov 2015   Pain due to dental caries 05/09/2015   Pneumonia 4 years ago   Recurrent depressive disorder, current episode moderate ([place]) 04/24/2015   Severe depression ([place]) 05/12/2016   Special screening for malignant neoplasms, colon    Tachycardia    Thrombocytosis    Vitamin D deficiency disease     Surgical History:  Past Surgical History:  Procedure Laterality Date   CESAREAN SECTION  09/27/79, 08/29/83   x 2   COLON RESECTION N/A 10/08/2017   Procedure: LAPAROSCOPIC SIGMOID COLON RESECTION Converted to Open Sigmoid Colectomy;  Surgeon: [name], MD;  Location: [facility];  Service: General;  Laterality: N/A;   COLONOSCOPY WITH PROPOFOL N/A 01/09/2016   Procedure: COLONOSCOPY WITH PROPOFOL;  Surgeon: Lucilla Lame, MD;  Location: [facility];  Service: Endoscopy;  Laterality: N/A;   POLYPECTOMY  01/09/2016   Procedure: POLYPECTOMY;  Surgeon: Lucilla Lame, MD;  Location: [facility];  Service: Endoscopy;;    Medications:  Current Outpatient Medications on File Prior to Visit  Medication Sig   albuterol (VENTOLIN HFA) 108 (90 Base) MCG/ACT inhaler Inhale 2 puffs into the lungs every 6 (six) hours as needed for wheezing or shortness of breath.   fluticasone (FLONASE) 50 MCG/ACT nasal spray Use 2  spray(s) in each nostril once daily   hydrOXYzine (VISTARIL) 25 MG capsule TAKE 1 CAPSULE BY MOUTH THREE TIMES DAILY AS NEEDED FOR ANXIETY   montelukast (SINGULAIR) 10 MG tablet TAKE 1 TABLET BY MOUTH AT BEDTIME   omeprazole (PRILOSEC) 20 MG capsule Take 1 capsule (20 mg total) by mouth 2 (two) times daily before a meal.   sertraline (ZOLOFT) 100 MG tablet TAKE 2 TABLETS BY MOUTH ONCE DAILY   tamsulosin (FLOMAX) 0.4 MG CAPS capsule Take 1 capsule (0.4 mg total) by mouth daily.   tiZANidine (ZANAFLEX) 4 MG tablet Take 1 tablet (4 mg total) by mouth every 6 (six) hours as needed for muscle spasms.   traZODone (DESYREL) 50 MG tablet TAKE **Note De-Identified [place] Obfuscation** 1 TABLET BY MOUTH AT BEDTIME   No current facility-administered medications on file prior to visit.    Allergies:  Allergies  Allergen Reactions   Doxycycline Nausea And Vomiting   Elemental Sulfur Diarrhea and Nausea And Vomiting    Social History:  Social History   Socioeconomic History   Marital status: Married    Spouse name: Not on file   Number of children: Not on file   Years of education: Not on file   Highest education level: Not on file  Occupational History   Not on file  Tobacco Use   Smoking status: Every Day    Packs/day: 1.00    Years: 35.00    Pack years: 35.00    Types: Cigarettes    Last attempt to quit: 08/03/2013    Years since quitting: 7.4   Smokeless tobacco: Never  Vaping Use   Vaping Use: Never used  Substance and Sexual Activity   Alcohol use: No    Comment: occasional   Drug use: No   Sexual activity: Not Currently  Other Topics Concern   Not on file  Social History Narrative   Separated from husband Fall 2016   Social Determinants of Radio broadcast assistant Strain: Low Risk    Difficulty of Paying Living Expenses: Not hard at all  Food Insecurity: No Food Insecurity   Worried About Charity fundraiser in the Last Year: Never true   Arboriculturist in the Last Year: Never true  Transportation Needs:  No Transportation Needs   Lack of Transportation (Medical): No   Lack of Transportation (Non-Medical): No  Physical Activity: Insufficiently Active   Days of Exercise per Week: 2 days   Minutes of Exercise per Session: 30 min  Stress: Stress Concern Present   Feeling of Stress : To some extent  Social Connections: Socially Isolated   Frequency of Communication with Friends and Family: Once a week   Frequency of Social Gatherings with Friends and Family: Once a week   Attends Religious Services: Never   Printmaker: No   Attends Music therapist: Never   Marital Status: Never married  Human resources officer Violence: Not At Risk   Fear of Current or Ex-Partner: No   Emotionally Abused: No   Physically Abused: No   Sexually Abused: No   Social History   Tobacco Use  Smoking Status Every Day   Packs/day: 1.00   Years: 35.00   Pack years: 35.00   Types: Cigarettes   Last attempt to quit: 08/03/2013   Years since quitting: 7.4  Smokeless Tobacco Never   Social History   Substance and Sexual Activity  Alcohol Use No   Comment: occasional    Family History:  Family History  Problem Relation Age of Onset   Cancer Father        prostate   Heart disease Father    Heart attack Father    Hypertension Father    Stroke Neg Hx    COPD Neg Hx     Past medical history, surgical history, medications, allergies, family history and social history reviewed with patient today and changes made to appropriate areas of the chart.   Review of Systems - negative All other ROS negative except what is listed above and in the HPI.      Objective:    BP 137/88   Pulse 73   Temp 98.8 F (37.1 C) (Oral) **Note De-Identified [place] Obfuscation** Ht 5' 0.08" (1.526 m)   Wt 100 lb 6.4 oz (45.5 kg)   SpO2 98%   BMI 19.56 kg/m   Wt Readings from Last 3 Encounters:  01/10/21 100 lb 6.4 oz (45.5 kg)  10/04/20 102 lb 12.8 oz (46.6 kg)  09/17/20 103 lb 12.8 oz (47.1 kg)    Physical  Exam Constitutional:      General: She is awake. She is not in acute distress.    Appearance: She is well-developed. She is not ill-appearing.  HENT:     Head: Normocephalic and atraumatic.     Right Ear: Hearing, tympanic membrane, ear canal and external ear normal. No drainage.     Left Ear: Hearing, tympanic membrane, ear canal and external ear normal. No drainage.     Nose: Nose normal.     Right Sinus: No maxillary sinus tenderness or frontal sinus tenderness.     Left Sinus: No maxillary sinus tenderness or frontal sinus tenderness.     Mouth/Throat:     Mouth: Mucous membranes are moist.     Dentition: Abnormal dentition.     Pharynx: Oropharynx is clear. Uvula midline. No pharyngeal swelling, oropharyngeal exudate or posterior oropharyngeal erythema.  Eyes:     General: Lids are normal.        Right eye: No discharge.        Left eye: No discharge.     Extraocular Movements: Extraocular movements intact.     Conjunctiva/sclera: Conjunctivae normal.     Pupils: Pupils are equal, round, and reactive to light.     Visual Fields: Right eye visual fields normal and left eye visual fields normal.  Neck:     Thyroid: No thyromegaly.     Vascular: No carotid bruit.     Trachea: Trachea normal.  Cardiovascular:     Rate and Rhythm: Normal rate and regular rhythm.     Heart sounds: Normal heart sounds. No murmur heard.   No gallop.  Pulmonary:     Effort: Pulmonary effort is normal. No accessory muscle usage or respiratory distress.     Breath sounds: Normal breath sounds.  Chest:  Breasts:    Right: Normal. No axillary adenopathy or supraclavicular adenopathy.     Left: Normal. No axillary adenopathy or supraclavicular adenopathy.  Abdominal:     General: Bowel sounds are normal. There is no distension.     Palpations: Abdomen is soft. There is no hepatomegaly.  Musculoskeletal:        General: Normal range of motion.     Cervical back: Normal range of motion and neck  supple.     Right lower leg: No edema.     Left lower leg: No edema.  Lymphadenopathy:     Head:     Right side of head: No submental, submandibular, tonsillar, preauricular or posterior auricular adenopathy.     Left side of head: No submental, submandibular, tonsillar, preauricular or posterior auricular adenopathy.     Cervical: No cervical adenopathy.     Upper Body:     Right upper body: No supraclavicular, axillary or pectoral adenopathy.     Left upper body: No supraclavicular, axillary or pectoral adenopathy.  Skin:    General: Skin is warm and dry.     Capillary Refill: Capillary refill takes less than 2 seconds.     Findings: Lesion present. No rash.     Comments: One small, firm, non mobile lesion above right side upper lip.  Shiny in appearance **Note De-Identified [place] Obfuscation** and pearly.  One small, firm, non mobile mass in inner corner of right eye, pearly in appearance.  Neurological:     Mental Status: She is alert and oriented to person, place, and time.     Cranial Nerves: Cranial nerves are intact.     Gait: Gait is intact.     Deep Tendon Reflexes: Reflexes are normal and symmetric.     Reflex Scores:      Brachioradialis reflexes are 2+ on the right side and 2+ on the left side.      Patellar reflexes are 2+ on the right side and 2+ on the left side. Psychiatric:        Attention and Perception: Attention normal.        Mood and Affect: Mood normal.        Speech: Speech normal.        Behavior: Behavior normal. Behavior is cooperative.        Thought Content: Thought content normal.        Judgment: Judgment normal.   Results for orders placed or performed in visit on 09/17/20  CBC with Differential/Platelet  Result Value Ref Range   WBC 6.0 3.4 - 10.8 x10E3/uL   RBC 4.39 3.77 - 5.28 x10E6/uL   Hemoglobin 14.3 11.1 - 15.9 g/dL   Hematocrit 42.3 34.0 - 46.6 %   MCV 96 79 - 97 fL   MCH 32.6 26.6 - 33.0 pg   MCHC 33.8 31.5 - 35.7 g/dL   RDW 13.1 11.7 - 15.4 %   Platelets 343 150 - 450  x10E3/uL   Neutrophils 52 Not Estab. %   Lymphs 37 Not Estab. %   Monocytes 6 Not Estab. %   Eos 3 Not Estab. %   Basos 2 Not Estab. %   Neutrophils Absolute 3.1 1.4 - 7.0 x10E3/uL   Lymphocytes Absolute 2.2 0.7 - 3.1 x10E3/uL   Monocytes Absolute 0.4 0.1 - 0.9 x10E3/uL   EOS (ABSOLUTE) 0.2 0.0 - 0.4 x10E3/uL   Basophils Absolute 0.1 0.0 - 0.2 x10E3/uL   Immature Granulocytes 0 Not Estab. %   Immature Grans (Abs) 0.0 0.0 - 0.1 x10E3/uL  Comprehensive metabolic panel  Result Value Ref Range   Glucose 114 (H) 65 - 99 mg/dL   BUN 14 8 - 27 mg/dL   Creatinine, Ser 0.81 0.57 - 1.00 mg/dL   GFR calc non Af Amer 79 >59 mL/min/1.73   GFR calc Af Amer 91 >59 mL/min/1.73   BUN/Creatinine Ratio 17 12 - 28   Sodium 140 134 - 144 mmol/L   Potassium 4.0 3.5 - 5.2 mmol/L   Chloride 102 96 - 106 mmol/L   CO2 22 20 - 29 mmol/L   Calcium 10.3 8.7 - 10.3 mg/dL   Total Protein 7.4 6.0 - 8.5 g/dL   Albumin 4.5 3.8 - 4.8 g/dL   Globulin, Total 2.9 1.5 - 4.5 g/dL   Albumin/Globulin Ratio 1.6 1.2 - 2.2   Bilirubin Total 0.5 0.0 - 1.2 mg/dL   Alkaline Phosphatase 139 (H) 44 - 121 IU/L   AST 31 0 - 40 IU/L   ALT 22 0 - 32 IU/L  TSH  Result Value Ref Range   TSH 0.671 0.450 - 4.500 uIU/mL  VITAMIN D 25 Hydroxy (Vit-D Deficiency, Fractures)  Result Value Ref Range   Vit D, 25-Hydroxy 26.8 (L) 30.0 - 100.0 ng/mL  Magnesium  Result Value Ref Range   Magnesium 1.7 1.6 - 2.3 mg/dL **Note De-Identified [place] Obfuscation** Assessment & Plan:   Problem List Items Addressed This Visit       Cardiovascular and Mediastinum   Aortic atherosclerosis ([place])    Noted on lung screening in July 2021.  Will continue to recommend initiation of statin and daily baby ASA.  Recommend complete cessation of smoking.         Respiratory   Centrilobular emphysema (HCC) - Primary    Chronic, stable. Continue Albuterol inhaler PRN and Singulair daily.  Adjust inhaler regimen as needed, consider maintenance if worsening.  Consider spirometry at next  visit.  No recent exacerbations.       Relevant Orders   CBC with Differential/Platelet   TSH   Allergic rhinitis    Chronic, stable.  Continue daily Flonase and Singulair for maintenance.           Digestive   GERD (gastroesophageal reflux disease)    Chronic, ongoing.  Continue Prilosec BID dosing.  Recommend heavy focus on diet and keeping food journal, eliminating foods that cause discomfort.  Mag level today.       Relevant Orders   Magnesium     Musculoskeletal and Integument   Skin lesion    One to right upper lip area and one to right inner eye, lower lid.  Will place referral to dermatology once patient notified provider who she wishes to see -- her sister works for local dermatology and she is going to ask her which one.         Other   Vitamin D deficiency disease    Chronic, ongoing.  Continue daily supplement and recheck Vit D level today.  Adjust as needed.  Next DEXA at age 11.       Relevant Orders   VITAMIN D 25 Hydroxy (Vit-D Deficiency, Fractures)   Generalized anxiety disorder    Chronic, ongoing.  Denies SI/HI.  Will continue Sertraline 200 MG daily + continue Vistaril as needed (change to Buspar in future).  This benefits both anxiety and depression elements. Return to office in 6 months.       Depression, major, single episode, moderate (HCC)    Chronic, ongoing.  Denies SI/HI.  Will continue Sertraline 200 MG, offering her benefit at this time.  This benefits both anxiety and depression elements.  Continue Vistaril as needed, consider change to Buspar in future.   Follow-up in 6 months.       Insomnia    Chronic, stable with PRN Trazodone.  She reports benefit from this as needed and less grogginess.  Continue this regimen and adjust as needed.         Cigarette nicotine dependence without complication    I have recommended complete cessation of tobacco use. I have discussed various options available for assistance with tobacco cessation  including over the counter methods (Nicotine gum, patch and lozenges). We also discussed prescription options (Chantix, Nicotine Inhaler / Nasal Spray). The patient is not interested in pursuing any prescription tobacco cessation options at this time. Continue yearly lung CT screening.        Elevated low density lipoprotein (LDL) cholesterol level    Noted on recent labs, ASCVD 8.5%.  At this time continue to focus on diet changes and regular activity.  Lipid panel today and continue to recommend initiation of statin.       Relevant Orders   Comprehensive metabolic panel   Lipid Panel w/o Chol/HDL Ratio   TSH   Other Visit Diagnoses     Poor **Note De-Identified [place] Obfuscation** dentition requiring referral to dentistry       Referral to dental placed.   Relevant Orders   Ambulatory referral to Dentistry   Need for shingles vaccine       Shingles vaccines ordered to pharmacy.   Relevant Medications   Zoster Vaccine Adjuvanted [facility]) injection   Zoster Vaccine Adjuvanted [facility]) injection (Start on 07/14/2021)   Need for hepatitis C screening test       Hep C one time screening ordered, discussed with patient.   Relevant Orders   Hepatitis C antibody   Annual physical exam       Annual labs obtained and health maintenance reviewed.        Follow up plan: Return in about 6 months (around 07/12/2021) for MOOD, COPD, HLD.   LABORATORY TESTING:  - Pap smear: up to date  IMMUNIZATIONS:   - Tdap: Tetanus vaccination status reviewed: last tetanus booster within 10 years. - Influenza: Up to date - Pneumovax: Refused - Prevnar: Refused - HPV: Not applicable - Zostavax vaccine: Refused  SCREENING: -Mammogram: Ordered today  - Colonoscopy: Up to date  - Bone Density: Not applicable  -Hearing Test: Not applicable  -Spirometry: Not applicable   PATIENT COUNSELING:   Advised to take 1 mg of folate supplement per day if capable of pregnancy.   Sexuality: Discussed sexually transmitted diseases,  partner selection, use of condoms, avoidance of unintended pregnancy  and contraceptive alternatives.   Advised to avoid cigarette smoking.  I discussed with the patient that most people either abstain from alcohol or drink within safe limits (<=14/week and <=4 drinks/occasion for males, <=7/weeks and <= 3 drinks/occasion for females) and that the risk for alcohol disorders and other health effects rises proportionally with the number of drinks per week and how often a drinker exceeds daily limits.  Discussed cessation/primary prevention of drug use and availability of treatment for abuse.   Diet: Encouraged to adjust caloric intake to maintain  or achieve ideal body weight, to reduce intake of dietary saturated fat and total fat, to limit sodium intake by avoiding high sodium foods and not adding table salt, and to maintain adequate dietary potassium and calcium preferably from fresh fruits, vegetables, and low-fat dairy products.    Stressed the importance of regular exercise  Injury prevention: Discussed safety belts, safety helmets, smoke detector, smoking near bedding or upholstery.   Dental health: Discussed importance of regular tooth brushing, flossing, and dental visits.    NEXT PREVENTATIVE PHYSICAL DUE IN 1 YEAR. Return in about 6 months (around 07/12/2021) for MOOD, COPD, HLD.

## 2021-01-10 NOTE — Assessment & Plan Note (Signed)
**Note De-Identified [place]co Obfuscation** Chronic, ongoing.  Denies SI/HI.  Will continue Sertraline 200 MG, offering her benefit at this time.  This benefits both anxiety and depression elements.  Continue Vistaril as needed, consider change to Buspar in future.   Follow-up in 6 months.

## 2021-01-10 NOTE — Assessment & Plan Note (Signed)
**Note De-Identified Freitas Obfuscation** Noted on lung screening in July 2021.  Will continue to recommend initiation of statin and daily baby ASA.  Recommend complete cessation of smoking.

## 2021-01-10 NOTE — Assessment & Plan Note (Signed)
**Note De-Identified [place] Obfuscation** One to right upper lip area and one to right inner eye, lower lid.  Will place referral to dermatology once patient notified provider who she wishes to see -- her sister works for local dermatology and she is going to ask her which one.

## 2021-01-10 NOTE — Assessment & Plan Note (Signed)
**Note De-identified [place] Obfuscation** Chronic, ongoing.  Denies SI/HI.  Will continue Sertraline 200 MG daily + continue Vistaril as needed (change to Buspar in future).  This benefits both anxiety and depression elements. Return to office in 6 months. 

## 2021-01-10 NOTE — Assessment & Plan Note (Signed)
**Note De-Identified Spangler Obfuscation** Chronic, ongoing.  Continue Prilosec BID dosing.  Recommend heavy focus on diet and keeping food journal, eliminating foods that cause discomfort.  Mag level today.

## 2021-01-10 NOTE — Assessment & Plan Note (Signed)
**Note De-Identified [place] Obfuscation** Chronic, stable with PRN Trazodone.  She reports benefit from this as needed and less grogginess.  Continue this regimen and adjust as needed.

## 2021-01-10 NOTE — Assessment & Plan Note (Signed)
**Note De-Identified [place] Obfuscation** Chronic, stable.  Continue daily Flonase and Singulair for maintenance.

## 2021-01-10 NOTE — Assessment & Plan Note (Signed)
**Note De-Identified Abdelrahman Obfuscation** I have recommended complete cessation of tobacco use. I have discussed various options available for assistance with tobacco cessation including over the counter methods (Nicotine gum, patch and lozenges). We also discussed prescription options (Chantix, Nicotine Inhaler / Nasal Spray). The patient is not interested in pursuing any prescription tobacco cessation options at this time. Continue yearly lung CT screening.

## 2021-01-10 NOTE — Assessment & Plan Note (Signed)
**Note De-Identified Brandenberger Obfuscation** Chronic, stable. Continue Albuterol inhaler PRN and Singulair daily.  Adjust inhaler regimen as needed, consider maintenance if worsening.  Consider spirometry at next visit.  No recent exacerbations.

## 2021-01-11 LAB — LIPID PANEL W/O CHOL/HDL RATIO
Cholesterol, Total: 218 mg/dL — ABNORMAL HIGH (ref 100–199)
HDL: 56 mg/dL (ref >39)
LDL Chol Calc (NIH): 110 mg/dL — ABNORMAL HIGH (ref 0–99)
Triglycerides: 304 mg/dL — ABNORMAL HIGH (ref 0–149)
VLDL Cholesterol Cal: 52 mg/dL — ABNORMAL HIGH (ref 5–40)

## 2021-01-11 LAB — COMPREHENSIVE METABOLIC PANEL
ALT: 17 IU/L (ref 0–32)
AST: 22 IU/L (ref 0–40)
Albumin/Globulin Ratio: 1.6 (ref 1.2–2.2)
Albumin: 4.4 g/dL (ref 3.8–4.8)
Alkaline Phosphatase: 129 IU/L — ABNORMAL HIGH (ref 44–121)
BUN/Creatinine Ratio: 15 (ref 12–28)
BUN: 12 mg/dL (ref 8–27)
Bilirubin Total: 0.4 mg/dL (ref 0.0–1.2)
CO2: 21 mmol/L (ref 20–29)
Calcium: 9.6 mg/dL (ref 8.7–10.3)
Chloride: 103 mmol/L (ref 96–106)
Creatinine, Ser: 0.82 mg/dL (ref 0.57–1.00)
Globulin, Total: 2.7 g/dL (ref 1.5–4.5)
Glucose: 86 mg/dL (ref 65–99)
Potassium: 4.2 mmol/L (ref 3.5–5.2)
Sodium: 140 mmol/L (ref 134–144)
Total Protein: 7.1 g/dL (ref 6.0–8.5)
eGFR: 81 mL/min/{1.73_m2} (ref >59)

## 2021-01-11 LAB — CBC WITH DIFFERENTIAL/PLATELET
Basophils Absolute: 0 10*3/uL (ref 0.0–0.2)
Basos: 1 %
EOS (ABSOLUTE): 0.1 10*3/uL (ref 0.0–0.4)
Eos: 1 %
Hematocrit: 43.1 % (ref 34.0–46.6)
Hemoglobin: 14.6 g/dL (ref 11.1–15.9)
Immature Grans (Abs): 0 10*3/uL (ref 0.0–0.1)
Immature Granulocytes: 0 %
Lymphocytes Absolute: 1.9 10*3/uL (ref 0.7–3.1)
Lymphs: 25 %
MCH: 32.2 pg (ref 26.6–33.0)
MCHC: 33.9 g/dL (ref 31.5–35.7)
MCV: 95 fL (ref 79–97)
Monocytes Absolute: 0.5 10*3/uL (ref 0.1–0.9)
Monocytes: 6 %
Neutrophils Absolute: 5.1 10*3/uL (ref 1.4–7.0)
Neutrophils: 67 %
Platelets: 302 10*3/uL (ref 150–450)
RBC: 4.54 x10E6/uL (ref 3.77–5.28)
RDW: 13.4 % (ref 11.7–15.4)
WBC: 7.6 10*3/uL (ref 3.4–10.8)

## 2021-01-11 LAB — VITAMIN D 25 HYDROXY (VIT D DEFICIENCY, FRACTURES): Vit D, 25-Hydroxy: 50.2 ng/mL (ref 30.0–100.0)

## 2021-01-11 LAB — TSH: TSH: 1.07 u[IU]/mL (ref 0.450–4.500)

## 2021-01-11 LAB — MAGNESIUM: Magnesium: 1.8 mg/dL (ref 1.6–2.3)

## 2021-01-11 LAB — HEPATITIS C ANTIBODY: Hep C Virus Ab: <0.1 s/co ratio (ref 0.0–0.9)

## 2021-01-11 NOTE — Progress Notes (Signed)
**Note De-Identified [place] Obfuscation** Contacted [facility] The 10-year ASCVD risk score Mikey Bussing DC Jr., et al., 2013) is: 8.9%   Values used to calculate the score:     Age: 60 years     Sex: Female     Is Non-Hispanic African American: No     Diabetic: No     Tobacco smoker: Yes     Systolic Blood Pressure: 284 mmHg     Is BP treated: No     HDL Cholesterol: 56 mg/dL     Total Cholesterol: 218 mg/dL   Good afternoon, your labs have returned.  Overall they remain stable, with exception of cholesterol.  Your cholesterol is still high, but continued recommendations to make lifestyle changes. Your LDL is above normal. The LDL is the bad cholesterol. Over time and in combination with inflammation and other factors, this contributes to plaque which in turn may lead to stroke and/or heart attack down the road. Sometimes high LDL is primarily genetic, and people might be eating all the right foods but still have high numbers. Other times, there is room for improvement in one's diet and eating healthier can bring this number down and potentially reduce one's risk of heart attack and/or stroke.   To reduce your LDL, Remember - more fruits and vegetables, more fish, and limit red meat and dairy products. More soy, nuts, beans, barley, lentils, oats and plant sterol ester enriched margarine instead of butter. I also encourage eliminating sugar and processed food. Remember, shop on the outside of the grocery store and visit your [employer]. If you would like to talk with me about dietary changes plus or minus medications for your cholesterol, please let me know. We should recheck your cholesterol in 6 months.  Any questions? Keep being awesome!!  Thank you for allowing me to participate in your care.  I appreciate you. Kindest regards, Carly Sabo

## 2021-01-15 ENCOUNTER — Ambulatory Visit: Payer: 59 | Admitting: Dermatology

## 2021-01-15 ENCOUNTER — Other Ambulatory Visit: Payer: Self-pay | Admitting: Nurse Practitioner

## 2021-01-15 ENCOUNTER — Encounter: Payer: Self-pay | Admitting: Dermatology

## 2021-01-15 ENCOUNTER — Other Ambulatory Visit: Payer: Self-pay

## 2021-01-15 DIAGNOSIS — K130 Diseases of lips: Secondary | ICD-10-CM | POA: Diagnosis not present

## 2021-01-15 DIAGNOSIS — L720 Epidermal cyst: Secondary | ICD-10-CM | POA: Diagnosis not present

## 2021-01-15 DIAGNOSIS — L578 Other skin changes due to chronic exposure to nonionizing radiation: Secondary | ICD-10-CM

## 2021-01-15 DIAGNOSIS — L821 Other seborrheic keratosis: Secondary | ICD-10-CM | POA: Diagnosis not present

## 2021-01-15 DIAGNOSIS — D2339 Other benign neoplasm of skin of other parts of face: Secondary | ICD-10-CM

## 2021-01-15 DIAGNOSIS — D239 Other benign neoplasm of skin, unspecified: Secondary | ICD-10-CM

## 2021-01-15 MED ORDER — KETOCONAZOLE 2 % EX CREA: 1.0000 "application " | TOPICAL_CREAM | Freq: Two times a day (BID) | CUTANEOUS | 0 refills | Status: AC

## 2021-01-15 MED ORDER — HYDROCORTISONE 2.5 % EX OINT: TOPICAL_OINTMENT | Freq: Two times a day (BID) | CUTANEOUS | 0 refills | Status: DC

## 2021-01-15 NOTE — Progress Notes (Addendum)
**Note De-identified Reihl Obfuscation**   **Note De-Identified [place] Obfuscation** New Patient Visit  Subjective  Haley [name] is a 60 y.o. female who presents for the following: lesion (Check lesion at right upper lip. Dur: 2-3 weeks. Was larger and painful when first started. Ruptured 2 days ago, drained a white substance. Has improved some. PCP recommended dermatology consult. ).  Husband with patient.   Review of Systems: No other skin or systemic complaints except as noted in HPI or Assessment and Plan.   Objective  Well appearing patient in no apparent distress; mood and affect are within normal limits.  A focused examination was performed including head, including the scalp, face, neck, nose, ears, eyelids, and lips. Relevant physical exam findings are noted in the Assessment and Plan.  Right Upper Cutaneous Lip Inflammatory papule with slight central crust right upper lip  bilateral corners of mouth Erythematous scaly patches  Right Malar Cheek Stuck-on, waxy, tan-brown papules and plaques -- Discussed benign etiology and prognosis.   Right Medial Canthus Clear cystic papule  Assessment & Plan  Epidermal inclusion cyst Right Upper Cutaneous Lip  Patient noted white cheese-like material extruding from center after bumping site  Exam and history most consistent with an epidermal inclusion cyst. Low suspicion for malignancy at this time. Discussed that a cyst is a benign growth that can grow over time and sometimes get irritated or inflamed. Recommend observation. Call for worsening.Discussed option of surgical excision to remove it if it is growing, symptomatic, or other changes noted. Please call for new or changing lesions so they can be evaluated.  Okay to apply Neosporin oint. BID (pt is not allergic)  Angular cheilitis bilateral corners of mouth  Significantly bothersome despite neosporin use  Start Ketoconazole 2% cream BID PRN (ok to substitute OTC clotrimazole cream if ketoconazole expensive)  Start HC 2.5% cream BID up to 1 week PRN  flares  If not improving in 1 week call office and will add mupirocin.  ketoconazole (NIZORAL) 2 % cream - bilateral corners of mouth Apply 1 application topically 2 (two) times daily. To corners of mouth before applying the hydrocortisone.  hydrocortisone 2.5 % ointment - bilateral corners of mouth Apply topically 2 (two) times daily. To corners of mouth for up to 1 week.  Seborrheic keratosis Right Malar Cheek  Reassured benign age-related growth.  Recommend observation.  Discussed cryotherapy if spot(s) become irritated or inflamed.  Hydrocystoma Right Medial Canthus  Benign-appearing.  Observation.  Call clinic for new or changing lesions.  Discussed option of drainage or excision. Deferred today.  Actinic Damage - chronic, secondary to cumulative UV radiation exposure/sun exposure over time - diffuse scaly erythematous macules with underlying dyspigmentation - Recommend daily broad spectrum sunscreen SPF 30+ to sun-exposed areas, reapply every 2 hours as needed.  - Recommend staying in the shade or wearing long sleeves, sun glasses (UVA+UVB protection) and wide brim hats (4-inch brim around the entire circumference of the hat). - Call for new or changing lesions.   Return for recheck lesion 3-6 weeks.  I, Haley [name], CMA, am acting as scribe for Haley Gleason, MD.  Documentation: I have reviewed the above documentation for accuracy and completeness, and I agree with the above.  Haley Gleason, MD

## 2021-01-15 NOTE — Telephone Encounter (Signed)
**Note De-identified Radziewicz Obfuscation** Scheduled 12/12.

## 2021-01-15 NOTE — Patient Instructions (Addendum)
**Note De-Identified [place] Obfuscation** Okay to use over the counter antifungal cream (clotrimazole) for lips if Ketoconazole is too expensive.   Recommend daily broad spectrum sunscreen SPF 30+ to sun-exposed areas, reapply every 2 hours as needed. Call for new or changing lesions.  Staying in the shade or wearing long sleeves, sun glasses (UVA+UVB protection) and wide brim hats (4-inch brim around the entire circumference of the hat) are also recommended for sun protection.    Recommend taking Heliocare sun protection supplement daily in sunny weather for additional sun protection. For maximum protection on the sunniest days, you can take up to 2 capsules of regular Heliocare OR take 1 capsule of Heliocare Ultra. For prolonged exposure (such as a full day in the sun), you can repeat your dose of the supplement 4 hours after your first dose. Heliocare can be purchased at [facility] or at [email or web address].    Topical steroids (such as triamcinolone, fluocinolone, fluocinonide, mometasone, clobetasol, halobetasol, betamethasone, hydrocortisone) can cause thinning and lightening of the skin if they are used for too long in the same area. Your physician has selected the right strength medicine for your problem and area affected on the body. Please use your medication only as directed by your physician to prevent side effects.    If you have any questions or concerns for your doctor, please call our main line at [phone] and press option 4 to reach your doctor's medical assistant. If no one answers, please leave a voicemail as directed and we will return your call as soon as possible. Messages left after 4 pm will be answered the following business day.   You may also send [place] a message [place]. We typically respond to MyChart messages within 1-2 business days.  For prescription refills, please ask your pharmacy to contact our office. Our fax number is [phone].  If you have an urgent issue when the clinic is closed that  cannot wait until the next business day, you can page your doctor at the number below.    Please note that while we do our best to be available for urgent issues outside of office hours, we are not available 24/7.   If you have an urgent issue and are unable to reach [place], you may choose to seek medical care at your doctor's office, retail clinic, urgent care center, or emergency room.  If you have a medical emergency, please immediately call 911 or go to the emergency department.  Pager Numbers  - Dr. Nehemiah Massed: [phone]  - Dr. Laurence Ferrari: [phone]  - Dr. Nicole Kindred: [phone]  In the event of inclement weather, please call our main line at [phone] for an update on the status of any delays or closures.  Dermatology Medication Tips: Please keep the boxes that topical medications come in in order to help keep track of the instructions about where and how to use these. Pharmacies typically print the medication instructions only on the boxes and not directly on the medication tubes.   If your medication is too expensive, please contact our office at [phone] option 4 or send [place] a message through [place].   We are unable to tell what your co-pay for medications will be in advance as this is different depending on your insurance coverage. However, we may be able to find a substitute medication at lower cost or fill out paperwork to get insurance to cover a needed medication.   If a prior authorization is required to get your medication covered by your insurance company, please **Note De-Identified Tolle Obfuscation** allow [place] 1-2 business days to complete this process.  Drug prices often vary depending on where the prescription is filled and some pharmacies may offer cheaper prices.  The website www.goodrx.com contains coupons for medications through different pharmacies. The prices here do not account for what the cost may be with help from insurance (it may be cheaper with your insurance), but the website can give you the  price if you did not use any insurance.  - You can print the associated coupon and take it with your prescription to the pharmacy.  - You may also stop by our office during regular business hours and pick up a GoodRx coupon card.  - If you need your prescription sent electronically to a different pharmacy, notify our office through [facility] or by phone at [phone] option 4.

## 2021-01-15 NOTE — Telephone Encounter (Signed)
**Note De-Identified Anand Obfuscation** Requested medications are due for refill today.  yes  Requested medications are on the active medications list.  yes  Last refill. 01/08/2020  Future visit scheduled.   yes  Notes to clinic.  Prescription is expired.

## 2021-02-05 ENCOUNTER — Ambulatory Visit: Payer: 59 | Admitting: Dermatology

## 2021-04-14 ENCOUNTER — Ambulatory Visit: Payer: Self-pay

## 2021-04-14 NOTE — Telephone Encounter (Signed)
**Note De-Identified [place] Obfuscation** Pt has had bad headaches for a month now she says, accompanied with neck/shoulder pain.  Reason for Disposition  [1] MODERATE headache (e.g., interferes with normal activities) AND [2] present > 24 hours AND [3] unexplained  (Exceptions: analgesics not tried, typical migraine, or headache part of viral illness)  Answer Assessment - Initial Assessment Questions 1. LOCATION: "Where does it hurt?"      Top and side 2. ONSET: "When did the headache start?" (Minutes, hours or days)      1 month ago 3. PATTERN: "Does the pain come and go, or has it been constant since it started?"     Comes and goes 4. SEVERITY: "How bad is the pain?" and "What does it keep you from doing?"  (e.g., Scale 1-10; mild, moderate, or severe)   - MILD (1-3): doesn't interfere with normal activities    - MODERATE (4-7): interferes with normal activities or awakens from sleep    - SEVERE (8-10): excruciating pain, unable to do any normal activities        7 5. RECURRENT SYMPTOM: "Have you ever had headaches before?" If Yes, ask: "When was the last time?" and "What happened that time?"      Yes 6. CAUSE: "What do you think is causing the headache?"     Stress 7. MIGRAINE: "Have you been diagnosed with migraine headaches?" If Yes, ask: "Is this headache similar?"      No 8. HEAD INJURY: "Has there been any recent injury to the head?"      No 9. OTHER SYMPTOMS: "Do you have any other symptoms?" (fever, stiff neck, eye pain, sore throat, cold symptoms)     Neck and shoulder pain 10. PREGNANCY: "Is there any chance you are pregnant?" "When was your last menstrual period?"       No  Protocols used: Headache-A-AH

## 2021-04-14 NOTE — Telephone Encounter (Signed)
**Note De-Identified Borel Obfuscation** Routing to provider as an Pharmacist, hospital for appointment.

## 2021-04-14 NOTE — Telephone Encounter (Signed)
**Note De-Identified Fader Obfuscation** Noted, will discuss at upcoming appointment.

## 2021-04-14 NOTE — Telephone Encounter (Signed)
**Note De-Identified Wieser Obfuscation** Pt. Reports she started having headaches x 1 month. Comes and goes. Has pain across her shoulders as well. "I think it's tension." Has an appointment for this week.

## 2021-04-17 ENCOUNTER — Encounter: Payer: Self-pay | Admitting: Nurse Practitioner

## 2021-04-17 ENCOUNTER — Telehealth: Payer: Self-pay

## 2021-04-17 ENCOUNTER — Ambulatory Visit (INDEPENDENT_AMBULATORY_CARE_PROVIDER_SITE_OTHER): Payer: 59 | Admitting: Nurse Practitioner

## 2021-04-17 ENCOUNTER — Other Ambulatory Visit: Payer: Self-pay

## 2021-04-17 VITALS — BP 126/74 | HR 71 | Temp 98.8°F | Wt 98.6 lb

## 2021-04-17 DIAGNOSIS — F17210 Nicotine dependence, cigarettes, uncomplicated: Secondary | ICD-10-CM

## 2021-04-17 DIAGNOSIS — R519 Headache, unspecified: Secondary | ICD-10-CM | POA: Insufficient documentation

## 2021-04-17 DIAGNOSIS — K089 Disorder of teeth and supporting structures, unspecified: Secondary | ICD-10-CM | POA: Diagnosis not present

## 2021-04-17 DIAGNOSIS — F5101 Primary insomnia: Secondary | ICD-10-CM

## 2021-04-17 DIAGNOSIS — Z599 Problem related to housing and economic circumstances, unspecified: Secondary | ICD-10-CM | POA: Insufficient documentation

## 2021-04-17 MED ORDER — TIZANIDINE HCL 4 MG PO TABS: 4.0000 mg | ORAL_TABLET | Freq: Four times a day (QID) | ORAL | 4 refills | Status: DC | PRN

## 2021-04-17 MED ORDER — AMITRIPTYLINE HCL 10 MG PO TABS: 10.0000 mg | ORAL_TABLET | Freq: Every day | ORAL | 4 refills | Status: DC

## 2021-04-17 MED ORDER — AMOXICILLIN-POT CLAVULANATE 875-125 MG PO TABS: 1.0000 | ORAL_TABLET | Freq: Two times a day (BID) | ORAL | 0 refills | Status: AC

## 2021-04-17 MED ORDER — PREDNISONE 20 MG PO TABS: 40.0000 mg | ORAL_TABLET | Freq: Every day | ORAL | 0 refills | Status: AC

## 2021-04-17 NOTE — Assessment & Plan Note (Signed)
**Note De-Identified Thalman Obfuscation** Chronic, ongoing.  Will change from Trazodone to Amitriptyline for sleep and mood + may benefit headaches at this time.  Return to office in 4 weeks.

## 2021-04-17 NOTE — Patient Instructions (Signed)
**Note De-Identified Tang Obfuscation** Cervicogenic Headache A cervicogenic headache is a headache caused by a condition that affects the bones and tissues in your neck (cervical spine). In a cervicogenic headache, the pain moves from your neck to your head. Most cervicogenic headaches start in the upper part of the neck with the first three cervical bones (cervical vertebrae). A cervicogenic headache is diagnosed when a cause can be found in the cervical spine and other causes of headaches can be ruled out. What are the causes? The most common cause of this condition is a traumatic injury to the cervical spine, such as whiplash. Other causes include: Arthritis. Broken bone (fracture). Infection. Tumor. What are the signs or symptoms? The most common symptoms are neck and head pain. The pain is often located on one side. In some cases, there may be head pain without neck pain. Pain may be felt in the neck, back or side of the head, face, or behind the eyes. Other symptoms include: Limited movement in the neck. Arm or shoulder pain. How is this diagnosed? This condition may be diagnosed based on: Your symptoms. A physical exam. An injection that blocks nerve signals (nerve block). Imaging tests, such as: X-rays. CT scan. MRI. How is this treated? Treatment for this condition may depend on the underlying condition. Treatment may include: Medicines, such as: NSAIDs. Muscle relaxants. Physical therapy. Massage therapy. Complementary therapies, such as: Biofeedback. Meditation. Acupuncture. Nerve block injections. Botulinum toxin injections. Your treatment plan may involve working with a pain management team that includes your primary health care provider, a pain management specialist, a neurologist, and a physical therapist. Follow these instructions at home: Take over-the-counter and prescription medicines only as told by your health care provider. Do exercises at home as told by your physical therapist. Return to  your normal activities as told by your health care provider. Ask your health care provider what activities are safe for you. Avoid activities that trigger your headaches. Maintain good neck support and posture at home and at work. Keep all follow-up visits as told by your health care provider. This is important. Contact a health care provider if you have: Headaches that are getting worse and happening more often. Headaches with any of the following: Fever. Numbness. Weakness. Dizziness. Nausea or vomiting. Get help right away if: You have a very sudden and severe headache. Summary A cervicogenic headache is a headache caused by a condition that affects the bones and tissues in your cervical spine. Your health care provider may diagnose this condition with a physical exam, a nerve block, and imaging tests. Treatment may include medicine to reduce pain and inflammation, physical therapy, and nerve block injections. Complementary therapies, such as acupuncture and meditation, may be added to other treatments. Your treatment plan may involve working with a pain management team that includes your primary health care provider, a pain management specialist, a neurologist, and a physical therapist. This information is not intended to replace advice given to you by your health care provider. Make sure you discuss any questions you have with your health care provider. Document Revised: 10/31/2020 Document Reviewed: 05/30/2020 Elsevier Patient Education  2022 [employer].

## 2021-04-17 NOTE — Assessment & Plan Note (Signed)
**Note De-Identified [place] Obfuscation** I have recommended complete cessation of tobacco use. I have discussed various options available for assistance with tobacco cessation including over the counter methods (Nicotine gum, patch and lozenges). We also discussed prescription options (Chantix, Nicotine Inhaler / Nasal Spray). The patient is not interested in pursuing any prescription tobacco cessation options at this time. Continue yearly lung CT screening -- reached out to team as is past due and has not heard from scheduling team.

## 2021-04-17 NOTE — Assessment & Plan Note (Signed)
**Note De-Identified Lamarre Obfuscation** Referral to CCM and connected care teams.

## 2021-04-17 NOTE — Chronic Care Management (AMB) (Signed)
**Note De-identified Hennings Obfuscation**  **Note De-Identified Pedro Obfuscation** Care Management   Outreach Note  04/17/2021 Name: Haley [name] MRN: [id number] DOB: 1960-09-06  Referred by: Venita Lick, NP Reason for referral : Care Coordination (Outreach to schedule referral for LCSW and RN )   An unsuccessful telephone outreach was attempted today. The patient was referred to the case management team for assistance with care management and care coordination.   Follow Up Plan:  The care management team will reach out to the patient again over the next 5 days.  If patient returns call to provider office, please advise to call [facility]  at [place], [place], [place], [place] [address] Direct Dial: [phone] [email or web address] Website: [email or web address]

## 2021-04-17 NOTE — Chronic Care Management (AMB) (Signed)
**Note De-identified Millican Obfuscation**  **Note De-Identified Lucks Obfuscation** Care Management   Note  04/17/2021 Name: Chenin Blaisdell Marzette MRN: [id number] DOB: Apr 09, 1961  Gaynelle Arabian Werk is a 60 y.o. year old female who is a primary care patient of Cannady, Barbaraann Faster, NP. I reached out to [place] by phone today in response to a referral sent by Ms. Gaynelle Arabian Arns's health plan.    Ms. Helf was given information about care management services today including:  Care management services include personalized support from designated clinical staff supervised by her physician, including individualized plan of care and coordination with other care providers 24/7 contact phone numbers for assistance for urgent and routine care needs. The patient may stop care management services at any time by phone call to the office staff.  Patient agreed to services and verbal consent obtained.   Follow up plan: Telephone appointment with care management team member scheduled for: LCSW 04/25/2021 RN CM 05/21/2021  [address], [place], [place], [place] [address] Direct Dial: [phone] [email or web address]: [email or web address]

## 2021-04-17 NOTE — Progress Notes (Signed)
**Note De-Identified Granato Obfuscation** BP 126/74   Pulse 71   Temp 98.8 F (37.1 C) (Oral)   Wt 98 lb 9.6 oz (44.7 kg)   SpO2 99%   BMI 19.21 kg/m    Subjective:    Patient ID: Haley [name], female    DOB: 11/24/1960, 60 y.o.   MRN: [id number]  HPI: Haley [name] is a 60 y.o. female  Chief Complaint  Patient presents with   Headache    Pt states she has been having daily headaches for the past month. No medications in the past for headaches/migraines.    HEADACHES Over past month has headaches daily for the past month -- does have poor dentition and she reports pain is coming from top gums and into head and sinuses.  This pain then radiates into neck and shoulders.  Does endorse lots of stressors as well at this time, her sister took over everything her mother owns -- has financial concerns.  She endorses poor sleep pattern due to caring for mom and wanting to keep one ear open.  Continues to smoke 1 PPD, started smoking at age 26.  Last lung screening in July 2021. Duration: chronic Onset: gradual Severity: 7/10 Quality: dull, aching, and throbbing Frequency: intermittent Location:  Headache duration: Radiation: no Time of day headache occurs:  Alleviating factors:  Aggravating factors:  Headache status at time of visit: current headache Treatments attempted: Treatments attempted: APAP   Aura: no Nausea:  yes Vomiting: no Photophobia:  no Phonophobia:   has this at baseline Effect on social functioning:  no Numbers of missed days of school/work each month: none Confusion:  no Gait disturbance/ataxia:  no Behavioral changes:  no Fevers:  no   Relevant past medical, surgical, family and social history reviewed and updated as indicated. Interim medical history since our last visit reviewed. Allergies and medications reviewed and updated.  Review of Systems  Constitutional:  Negative for activity change, appetite change, diaphoresis, fatigue and fever.  HENT:  Positive for dental problem.    Respiratory:  Negative for cough, chest tightness and shortness of breath.   Cardiovascular:  Negative for chest pain, palpitations and leg swelling.  Gastrointestinal: Negative.   Neurological:  Positive for headaches. Negative for dizziness, speech difficulty, weakness, light-headedness and numbness.  Psychiatric/Behavioral:  Positive for decreased concentration and sleep disturbance. Negative for self-injury and suicidal ideas. The patient is nervous/anxious.    Per HPI unless specifically indicated above     Objective:    BP 126/74   Pulse 71   Temp 98.8 F (37.1 C) (Oral)   Wt 98 lb 9.6 oz (44.7 kg)   SpO2 99%   BMI 19.21 kg/m   Wt Readings from Last 3 Encounters:  04/17/21 98 lb 9.6 oz (44.7 kg)  01/10/21 100 lb 6.4 oz (45.5 kg)  10/04/20 102 lb 12.8 oz (46.6 kg)    Physical Exam Vitals and nursing note reviewed.  Constitutional:      General: She is awake. She is not in acute distress.    Appearance: She is well-developed and well-groomed. She is not ill-appearing.  HENT:     Head: Normocephalic.     Right Ear: Hearing, tympanic membrane, ear canal and external ear normal.     Left Ear: Hearing, tympanic membrane, ear canal and external ear normal.     Nose:     Right Sinus: Maxillary sinus tenderness and frontal sinus tenderness present.     Left Sinus: Maxillary sinus tenderness and frontal sinus **Note De-Identified Schnelle Obfuscation** tenderness present.     Mouth/Throat:     Mouth: Mucous membranes are moist.     Dentition: Abnormal dentition.     Comments: Upper teeth with poor dentition, multiple rotting teeth to gumline. Eyes:     General: Lids are normal.        Right eye: No discharge.        Left eye: No discharge.     Conjunctiva/sclera: Conjunctivae normal.     Pupils: Pupils are equal, round, and reactive to light.  Neck:     Thyroid: No thyromegaly.     Vascular: No carotid bruit.  Cardiovascular:     Rate and Rhythm: Normal rate and regular rhythm.     Heart sounds: Normal heart  sounds. No murmur heard.   No gallop.  Pulmonary:     Effort: Pulmonary effort is normal. No accessory muscle usage or respiratory distress.     Breath sounds: Normal breath sounds.  Abdominal:     General: Bowel sounds are normal. There is no distension.     Palpations: Abdomen is soft.  Musculoskeletal:     Cervical back: Normal range of motion and neck supple.     Right lower leg: No edema.     Left lower leg: No edema.  Skin:    General: Skin is warm and dry.  Neurological:     Mental Status: She is alert and oriented to person, place, and time.     Cranial Nerves: Cranial nerves are intact.     Motor: Motor function is intact.     Coordination: Coordination is intact.     Gait: Gait is intact.     Deep Tendon Reflexes: Reflexes are normal and symmetric.     Reflex Scores:      Brachioradialis reflexes are 2+ on the right side and 2+ on the left side.      Patellar reflexes are 2+ on the right side and 2+ on the left side. Psychiatric:        Attention and Perception: Attention normal.        Mood and Affect: Mood normal.        Behavior: Behavior normal. Behavior is cooperative.        Thought Content: Thought content normal.        Judgment: Judgment normal.   Results for orders placed or performed in visit on 01/10/21  Hepatitis C antibody  Result Value Ref Range   Hep C Virus Ab <0.1 0.0 - 0.9 s/co ratio  Comprehensive metabolic panel  Result Value Ref Range   Glucose 86 65 - 99 mg/dL   BUN 12 8 - 27 mg/dL   Creatinine, Ser 0.82 0.57 - 1.00 mg/dL   eGFR 81 >59 mL/min/1.73   BUN/Creatinine Ratio 15 12 - 28   Sodium 140 134 - 144 mmol/L   Potassium 4.2 3.5 - 5.2 mmol/L   Chloride 103 96 - 106 mmol/L   CO2 21 20 - 29 mmol/L   Calcium 9.6 8.7 - 10.3 mg/dL   Total Protein 7.1 6.0 - 8.5 g/dL   Albumin 4.4 3.8 - 4.8 g/dL   Globulin, Total 2.7 1.5 - 4.5 g/dL   Albumin/Globulin Ratio 1.6 1.2 - 2.2   Bilirubin Total 0.4 0.0 - 1.2 mg/dL   Alkaline Phosphatase 129 (H)  44 - 121 IU/L   AST 22 0 - 40 IU/L   ALT 17 0 - 32 IU/L  CBC with Differential/Platelet  Result Value Ref Range **Note De-Identified Macadam Obfuscation** WBC 7.6 3.4 - 10.8 x10E3/uL   RBC 4.54 3.77 - 5.28 x10E6/uL   Hemoglobin 14.6 11.1 - 15.9 g/dL   Hematocrit 43.1 34.0 - 46.6 %   MCV 95 79 - 97 fL   MCH 32.2 26.6 - 33.0 pg   MCHC 33.9 31.5 - 35.7 g/dL   RDW 13.4 11.7 - 15.4 %   Platelets 302 150 - 450 x10E3/uL   Neutrophils 67 Not Estab. %   Lymphs 25 Not Estab. %   Monocytes 6 Not Estab. %   Eos 1 Not Estab. %   Basos 1 Not Estab. %   Neutrophils Absolute 5.1 1.4 - 7.0 x10E3/uL   Lymphocytes Absolute 1.9 0.7 - 3.1 x10E3/uL   Monocytes Absolute 0.5 0.1 - 0.9 x10E3/uL   EOS (ABSOLUTE) 0.1 0.0 - 0.4 x10E3/uL   Basophils Absolute 0.0 0.0 - 0.2 x10E3/uL   Immature Granulocytes 0 Not Estab. %   Immature Grans (Abs) 0.0 0.0 - 0.1 x10E3/uL  Lipid Panel w/o Chol/HDL Ratio  Result Value Ref Range   Cholesterol, Total 218 (H) 100 - 199 mg/dL   Triglycerides 304 (H) 0 - 149 mg/dL   HDL 56 >39 mg/dL   VLDL Cholesterol Cal 52 (H) 5 - 40 mg/dL   LDL Chol Calc (NIH) 110 (H) 0 - 99 mg/dL  TSH  Result Value Ref Range   TSH 1.070 0.450 - 4.500 uIU/mL  VITAMIN D 25 Hydroxy (Vit-D Deficiency, Fractures)  Result Value Ref Range   Vit D, 25-Hydroxy 50.2 30.0 - 100.0 ng/mL  Magnesium  Result Value Ref Range   Magnesium 1.8 1.6 - 2.3 mg/dL      Assessment & Plan:   Problem List Items Addressed This Visit       Other   Insomnia    Chronic, ongoing.  Will change from Trazodone to Amitriptyline for sleep and mood + may benefit headaches at this time.  Return to office in 4 weeks.      Cigarette nicotine dependence without complication    I have recommended complete cessation of tobacco use. I have discussed various options available for assistance with tobacco cessation including over the counter methods (Nicotine gum, patch and lozenges). We also discussed prescription options (Chantix, Nicotine Inhaler / Nasal Spray). The  patient is not interested in pursuing any prescription tobacco cessation options at this time. Continue yearly lung CT screening -- reached out to team as is past due and has not heard from scheduling team.       Sinus headache - Primary    New onset over past month, suspect related to poor dentition.  At this time treat with Augmentin for 10 days and Prednisone 40 MG daily x 5 days.  Urgent referral to dental placed, she is anxious with dental work and have requested one who offers nitrous.  Recommend to monitor teeth closely, if worsening symptoms alert provider ASAP.  Will also switch from Trazodone to Amitriptyline to help with sleep at night and stress. Return in 4 weeks.        Relevant Medications   tiZANidine (ZANAFLEX) 4 MG tablet   amitriptyline (ELAVIL) 10 MG tablet   Financial difficulties    Referral to CCM and connected care teams.      Relevant Orders   AMB Referral to [facility] Coordinaton   Other Visit Diagnoses     Poor dentition       Urgent referral to dental placed   Relevant Orders   Ambulatory referral **Note De-Identified Geiger Obfuscation** to Dentistry        Follow up plan: Return in about 4 weeks (around 05/15/2021) for Dental check and headaches.

## 2021-04-17 NOTE — Assessment & Plan Note (Signed)
**Note De-Identified Stahlman Obfuscation** New onset over past month, suspect related to poor dentition.  At this time treat with Augmentin for 10 days and Prednisone 40 MG daily x 5 days.  Urgent referral to dental placed, she is anxious with dental work and have requested one who offers nitrous.  Recommend to monitor teeth closely, if worsening symptoms alert provider ASAP.  Will also switch from Trazodone to Amitriptyline to help with sleep at night and stress. Return in 4 weeks.

## 2021-04-23 ENCOUNTER — Telehealth: Payer: Self-pay | Admitting: *Deleted

## 2021-04-23 NOTE — Telephone Encounter (Signed)
**Note De-identified Brazie Obfuscation**   **Note De-Identified Voit Obfuscation** Telephone encounter was:  Unsuccessful.  04/23/2021 Name: Haley [name] MRN: [id number] DOB: Aug 18, 1960  Unsuccessful outbound call made today to assist with:  Food Insecurity  Outreach Attempt:  1st Attempt  Mailbox is full [place], Care Management  [phone] [address] , [place] [address] Email : [email or web address]

## 2021-04-25 ENCOUNTER — Telehealth: Payer: Self-pay | Admitting: Licensed Clinical Social Worker

## 2021-04-25 ENCOUNTER — Telehealth: Payer: 59

## 2021-04-25 NOTE — Telephone Encounter (Addendum)
**Note De-identified Geigle Obfuscation**    **Note De-Identified Imperato Obfuscation** Clinical Social Work  Care Management   Phone Outreach    04/25/2021 Name: Haley [name] MRN: [id number] DOB: 11-19-1960  Haley [name] is a 60 y.o. year old female who is a primary care patient of Cannady, Barbaraann Faster, NP .   Reason for referral: Emergency planning/management officer  and [place] and Resources.    CCM LCSW reached out to patient today by phone to introduce self, assess needs and offer Care Management services and interventions.    Telephone outreach was unsuccessful. Unable to leave a HIPPA compliant phone message due to voice mail not set up.  Plan:Will reach out to patient again in the next 14 days .   Review of patient status, including review of consultants reports, relevant laboratory and other test results, and collaboration with appropriate care team members and the patient's provider was performed as part of comprehensive patient evaluation and provision of care management services.     Christa See, MSW, [email or web address] Phone [phone] 11:37 AM

## 2021-04-28 ENCOUNTER — Telehealth: Payer: Self-pay | Admitting: *Deleted

## 2021-04-28 ENCOUNTER — Telehealth: Payer: Self-pay

## 2021-04-28 NOTE — Telephone Encounter (Signed)
**Note De-Identified Trower Obfuscation** Copied from [place] [phone]. Topic: General - Inquiry >> Apr 28, 2021  4:37 PM Greggory Keen D wrote: Reason for CRM: Pt called asking if Jolene could please send something to the pharmacy.  She is having her top teeth pulled on Thursday.  [address]

## 2021-04-28 NOTE — Telephone Encounter (Signed)
**Note De-identified Sarvis Obfuscation**   **Note De-Identified Mistry Obfuscation** Telephone encounter was:  Unsuccessful.  04/28/2021 Name: Haley [name] MRN: [id number] DOB: Dec 13, 1960  Unsuccessful outbound call made today to assist with:  Food Insecurity and Financial Difficulties related to rent  Outreach Attempt:  2nd Attempt  Voicemail full  [place], Care Management  [phone] [address] , [place] [address] Email : [email or web address]

## 2021-04-29 MED ORDER — ALPRAZOLAM 0.5 MG PO TABS: ORAL_TABLET | ORAL | 0 refills | Status: DC

## 2021-04-30 ENCOUNTER — Telehealth: Payer: Self-pay | Admitting: *Deleted

## 2021-04-30 NOTE — Telephone Encounter (Signed)
**Note De-identified Orth Obfuscation**   **Note De-Identified Bale Obfuscation** Telephone encounter was:  Successful.  04/30/2021 Name: Jackelyn Illingworth Blanton MRN: [id number] DOB: January 31, 1961  Gaynelle Arabian Statz is a 60 y.o. year old female who is a primary care patient of Cannady, Barbaraann Faster, NP . The community resource team was consulted for assistance with Financial Difficulties related to dental proceduare  Care guide performed the following interventions: Patient provided with information about care guide support team and interviewed to confirm resource needs Follow up call placed to community resources to determine status of patients referral.  Follow Up Plan:  No further follow up planned at this time. The patient has been provided with needed resources.  .[place], Care Management  [phone] [address] , [place] [address] Email : [email or web address]

## 2021-05-02 ENCOUNTER — Telehealth: Payer: 59

## 2021-05-05 ENCOUNTER — Ambulatory Visit: Payer: 59 | Admitting: Licensed Clinical Social Worker

## 2021-05-05 NOTE — Chronic Care Management (AMB) (Signed)
**Note De-Identified Purifoy Obfuscation** Care Management Clinical Social Work Note  05/05/2021 Name: Cheryel Kyte Maser MRN: [id number] DOB: February 21, 1961  Gaynelle Arabian Tweedy is a 60 y.o. year old female who is a primary care patient of Cannady, Barbaraann Faster, NP.  The Care Management team was consulted for assistance with chronic disease management and coordination needs.  Engaged with patient by telephone for initial visit in response to provider referral for social work chronic care management and care coordination services  Consent to Services:  Ms. Korpi was given information about Care Management services today including:  Care Management services includes personalized support from designated clinical staff supervised by her physician, including individualized plan of care and coordination with other care providers 24/7 contact phone numbers for assistance for urgent and routine care needs. The patient may stop case management services at any time by phone call to the office staff.  Patient agreed to services and consent obtained.   Consent to Services:  The patient was given information about Care Management services, agreed to services, and gave verbal consent prior to initiation of services.  Please see initial visit note for detailed documentation.   Patient agreed to services today and consent obtained.   Assessment: Engaged with patient by phone in response to provider referral for social work care coordination services: [employer] , Mental Health Counseling and Resources, and Financial Difficulties related to affording dental care .    Patient is currently experiencing difficulty with affording dental care, which negatively impacts management of health conditions. Patient reports symptoms of depression and anxiety are well managed. Supportive resources discussed. See Care Plan below for interventions and patient self-care activities.  Recent life changes or stressors: Financial Strain, Caregiver Stress, and Strained relationships  with family  Recommendation: Patient may benefit from, and is in agreement work with LCSW to address care coordination needs and will continue to work with the clinical team to address health care and disease management related needs.   Follow up Plan: Patient would like continued follow-up from CCM LCSW .  per patient's request will follow up in 06/02/21.  Will call office if needed prior to next encounter.  SDOH (Social Determinants of Health) assessments and interventions performed:  NA  Advanced Directives Status: Not addressed in this encounter.  Care Plan  Allergies  Allergen Reactions   Doxycycline Nausea And Vomiting   Elemental Sulfur Diarrhea and Nausea And Vomiting    Outpatient Encounter Medications as of 05/05/2021  Medication Sig   albuterol (VENTOLIN HFA) 108 (90 Base) MCG/ACT inhaler INHALE 2 PUFFS INTO LUNGS EVERY 6 HOURS AS NEEDED FOR WHEEZING OR SHORTNESS OF BREATH   ALPRAZolam (XANAX) 0.5 MG tablet Take one tablet 60 minutes prior to dental procedures and if needed may repeat x one tablet 30 minutes prior to procedure.   amitriptyline (ELAVIL) 10 MG tablet Take 1 tablet (10 mg total) by mouth at bedtime.   fluticasone (FLONASE) 50 MCG/ACT nasal spray Use 2 spray(s) in each nostril once daily   hydrocortisone 2.5 % ointment Apply topically 2 (two) times daily. To corners of mouth for up to 1 week.   hydrOXYzine (VISTARIL) 25 MG capsule TAKE 1 CAPSULE BY MOUTH THREE TIMES DAILY AS NEEDED FOR ANXIETY   montelukast (SINGULAIR) 10 MG tablet Take 1 tablet (10 mg total) by mouth at bedtime.   omeprazole (PRILOSEC) 20 MG capsule Take 1 capsule (20 mg total) by mouth 2 (two) times daily before a meal.   sertraline (ZOLOFT) 100 MG tablet TAKE 2 TABLETS BY **Note De-Identified Rosasco Obfuscation** MOUTH ONCE DAILY   tiZANidine (ZANAFLEX) 4 MG tablet Take 1 tablet (4 mg total) by mouth every 6 (six) hours as needed for muscle spasms.   [START ON 07/14/2021] Zoster Vaccine Adjuvanted [facility]) injection Inject 0.5 mLs  into the muscle once for 1 dose. Dose # 2 (Patient not taking: Reported on 04/17/2021)   No facility-administered encounter medications on file as of 05/05/2021.    Patient Active Problem List   Diagnosis Date Noted   Sinus headache 04/17/2021   Financial difficulties 04/17/2021   Skin lesion 01/10/2021   Elevated low density lipoprotein (LDL) cholesterol level 01/10/2021   Aortic atherosclerosis (HCC) 02/09/2020   Cigarette nicotine dependence without complication 62/69/4854   Insomnia 05/01/2019   Depression, major, single episode, moderate (HCC) 02/14/2018   Allergic rhinitis 02/14/2018   IBS (irritable bowel syndrome) 02/02/2017   Generalized anxiety disorder 02/02/2017   Benign neoplasm of sigmoid colon    GERD (gastroesophageal reflux disease) 01/04/2016   Vitamin D deficiency disease    Centrilobular emphysema ([place])    Osteopenia 06/03/2014    Conditions to be addressed/monitored: Anxiety and Depression; Financial constraints related to affording dental care and Mental Health Concerns   Care Plan : LCSW Plan of Care  Updates made by Rebekah Chesterfield, LCSW since 05/05/2021 12:00 AM     Problem: Coping Skills (General Plan of Care)      Goal: Coping Skills Enhanced   Start Date: 05/05/2021  This Visit's Progress: On track  Priority: Medium  Note:   Current barriers:    Financial constraints related to affording dental care and Mental Health Concerns  Clinical Goals: Patient will work with CCM LCSW to address needs related to dental care Clinical Interventions:  Assessment of needs, barriers , agencies contacted, as well as how impacting care Patient reports that her symptoms of depression and anxiety are well managed right now, stating "I'm doing what I need to do for myself" Stressors include caregiving for elderly mother and the recent purchase of family property by brother in law, who has a strained relationship with patient Patient reports a decrease in headaches and  sinus pain, since referral to dentist where her upper teeth were removed. She has a scheduled follow up appt in 1.5 weeks Patient was successful in identifying healthy coping skills to assist with stress management. CCM LCSW informed patient of grounding strategies to assist with episodes of anxiety Patient reports that she has a good support system. She is not interested in counseling Patient received referral to a local dentist by Care Guide. Patient is requesting information regarding affording dentures, after she completes scheduled surgeries. CCM LCSW encouraged patient to inquire with dentist about financial assistance programs. CCM LCSW will also research supportive resources PHQ2/ PHQ9 completed Solution-Focused Strategies Active listening / Reflection utilized  Emotional Supportive Provided Provided psychoeducation for mental health needs  Reviewed mental health medications with patient and discussed compliance:  Caregiver stress acknowledged  Verbalization of feelings encouraged  Crisis Resource Education / information provided  Review various resources, discussed options and provided patient information about  Dental resources 1:1 collaboration with primary care provider regarding development and update of comprehensive plan of care as evidenced by provider attestation and co-signature Inter-disciplinary care team collaboration (see longitudinal plan of care) Patient Goals/Self-Care Activities: Over the next 120 days Continue with compliance of taking medication  Follow up with resources provided Follow up with dentist Utilize healthy coping skills discussed to assist with management of MH symptoms **Note De-Identified Pine Obfuscation** Christa See, MSW, [name] Phone [phone] 5:05 PM

## 2021-05-05 NOTE — Patient Instructions (Signed)
**Note De-Identified Snipe Obfuscation** Visit Information   Goals Addressed             This Visit's Progress    Obtain Supportive Resources   On track    Timeframe:  Long-Range Goal Priority:  Medium Start Date:       05/05/21                      Expected End Date:  08/02/21                     Follow Up Date 06/02/21  Patient Goals/Self-Care Activities: Over the next 120 days Continue with compliance of taking medication  Follow up with resources provided Follow up with dentist Utilize healthy coping skills discussed to assist with management of [place] symptoms        Patient verbalizes understanding of instructions provided today and agrees to view in [place].   Telephone follow up appointment with care management team member scheduled for:06/02/21  Christa See, MSW, [email or web address] Phone [phone] 5:08 PM

## 2021-05-19 ENCOUNTER — Ambulatory Visit: Payer: Self-pay | Admitting: Nurse Practitioner

## 2021-05-20 ENCOUNTER — Encounter: Payer: Self-pay | Admitting: Nurse Practitioner

## 2021-05-20 ENCOUNTER — Ambulatory Visit (INDEPENDENT_AMBULATORY_CARE_PROVIDER_SITE_OTHER): Payer: 59 | Admitting: Nurse Practitioner

## 2021-05-20 ENCOUNTER — Other Ambulatory Visit: Payer: Self-pay

## 2021-05-20 VITALS — BP 143/84 | HR 66 | Temp 98.7°F | Ht 60.4 in | Wt 95.8 lb

## 2021-05-20 DIAGNOSIS — R519 Headache, unspecified: Secondary | ICD-10-CM

## 2021-05-20 MED ORDER — AMITRIPTYLINE HCL 25 MG PO TABS: 25.0000 mg | ORAL_TABLET | Freq: Every day | ORAL | 2 refills | Status: DC

## 2021-05-20 NOTE — Assessment & Plan Note (Signed)
**Note De-Identified Meunier Obfuscation** Recently had all of her top teeth pulled. She goes back to dentist for follow-up on 05/27/21. She states her headache had resolved after having her teeth pulled, but then it started to come back again. She states she feels like there is a hole into her sinus on the left side and her dentist may refer her to an oral Psychologist, sport and exercise. She is eating bland foods and liquids. Will increase her amitriptyline to 25mg  qHS to help with headaches and stress. Discussed signs to watch for with serotonin syndrome as she is also taking zoloft. She has an appointment with her PCP on 07/14/21 and encouraged her to keep this appointment or sooner with any concerns.

## 2021-05-20 NOTE — Patient Instructions (Signed)
**Note De-Identified Thall Obfuscation** Increase amitriptyline to 25mg  every night

## 2021-05-20 NOTE — Progress Notes (Signed)
**Note De-Identified [place] Obfuscation** Established Patient Office Visit  Subjective:  Patient ID: Haley [name], female    DOB: July 02, 1961  Age: 60 y.o. MRN: [id number]  CC:  Chief Complaint  Patient presents with   Headache    4 week f/up- pt states the medication was helping at first but does not seem to be helping now     HPI Haley [name] presents for follow up on headaches. She states the medicine was helping at first, however her headaches have come back again. She recently had all of her top teeth pulled at the dentist office and is still healing. At first after her teeth pulled, her headaches have improved. She feels like there is a hole where one of her teeth was and liquids can get up into her sinuses from this spot. She has a follow-up appointment on 05/27/21 with her dentist who stated she may need a referral to an oral surgeon. Her headache today is a 5/10 and is "not that bad." The pain is mostly in her forehead, however it also goes to her neck and shoulders. She cares for her mom who has dementia and is having some increased stress. She has been taking tylenol which is helping some with the pain.    Past Medical History:  Diagnosis Date   Abdominal pain 04/29/2015   Acute diverticulitis of intestine 03/18/2017   Acute pancreatitis 01/13/2017   Acute sinusitis 09/01/2015   Anemia    Anxiety    Benign neoplasm of sigmoid colon    Breast cancer screening 07/08/2015   CHF (congestive heart failure) (HCC)    while hospitalized with pneumonia   Chronic pain syndrome    COPD (chronic obstructive pulmonary disease) ([place])    while hospitalized   Cyclic vomiting syndrome    Cyclical vomiting    Diverticulitis of large intestine with abscess without bleeding    Dysphagia    GERD (gastroesophageal reflux disease)    controlled   Hematuria, microscopic 04/26/2015   Urine Sept 2016; rechecked still present; refer to urologist; hx of "polyps" in bladder 25 years ago (1991-ish)    History of pneumonia    History of  sepsis    Hypertension    controlled on meds   Hypokalemia 04/24/2015   IBS (irritable bowel syndrome) 02/02/2017   Leukocytosis    Low serum potassium 01/13/2017   Marital stress 07/12/2015   Separated from husband Fall 2016    Moderate COPD (chronic obstructive pulmonary disease) ([place])    Osteopenia Nov 2015   Pain due to dental caries 05/09/2015   Pneumonia 4 years ago   Recurrent depressive disorder, current episode moderate ([place]) 04/24/2015   Severe depression ([place]) 05/12/2016   Special screening for malignant neoplasms, colon    Tachycardia    Thrombocytosis    Vitamin D deficiency disease     Past Surgical History:  Procedure Laterality Date   CESAREAN SECTION  09/27/79, 08/29/83   x 2   COLON RESECTION N/A 10/08/2017   Procedure: LAPAROSCOPIC SIGMOID COLON RESECTION Converted to Open Sigmoid Colectomy;  Surgeon: [name], MD;  Location: [facility];  Service: General;  Laterality: N/A;   COLONOSCOPY WITH PROPOFOL N/A 01/09/2016   Procedure: COLONOSCOPY WITH PROPOFOL;  Surgeon: Lucilla Lame, MD;  Location: [place];  Service: Endoscopy;  Laterality: N/A;   POLYPECTOMY  01/09/2016   Procedure: POLYPECTOMY;  Surgeon: Lucilla Lame, MD;  Location: [place];  Service: Endoscopy;;    Family History  Problem Relation **Note De-Identified [place] Obfuscation** Age of Onset   Cancer Father        prostate   Heart disease Father    Heart attack Father    Hypertension Father    Anxiety disorder Sister    Depression Sister    Depression Son    Anxiety disorder Son    Melanoma Paternal Grandmother 48       COD 31   Stroke Neg Hx    COPD Neg Hx     Social History   Socioeconomic History   Marital status: Married    Spouse name: Not on file   Number of children: Not on file   Years of education: Not on file   Highest education level: Not on file  Occupational History   Not on file  Tobacco Use   Smoking status: Every Day    Packs/day: 1.00    Years: 35.00    Pack years: 35.00    Types:  Cigarettes    Last attempt to quit: 08/03/2013    Years since quitting: 7.8   Smokeless tobacco: Never  Vaping Use   Vaping Use: Never used  Substance and Sexual Activity   Alcohol use: No    Comment: occasional   Drug use: No   Sexual activity: Not Currently  Other Topics Concern   Not on file  Social History Narrative   Separated from husband Fall 2016   Social Determinants of Health   Financial Resource Strain: High Risk   Difficulty of Paying Living Expenses: Hard  Food Insecurity: No Food Insecurity   Worried About Charity fundraiser in the Last Year: Never true   Arboriculturist in the Last Year: Never true  Transportation Needs: Public librarian (Medical): Yes   Lack of Transportation (Non-Medical): Yes  Physical Activity: Insufficiently Active   Days of Exercise per Week: 2 days   Minutes of Exercise per Session: 30 min  Stress: Stress Concern Present   Feeling of Stress : To some extent  Social Connections: Socially Isolated   Frequency of Communication with Friends and Family: Once a week   Frequency of Social Gatherings with Friends and Family: Once a week   Attends Religious Services: Never   Marine scientist or Organizations: No   Attends Music therapist: Never   Marital Status: Never married  Human resources officer Violence: Not At Risk   Fear of Current or Ex-Partner: No   Emotionally Abused: No   Physically Abused: No   Sexually Abused: No    Outpatient Medications Prior to Visit  Medication Sig Dispense Refill   albuterol (VENTOLIN HFA) 108 (90 Base) MCG/ACT inhaler INHALE 2 PUFFS INTO LUNGS EVERY 6 HOURS AS NEEDED FOR WHEEZING OR SHORTNESS OF BREATH 7 each 4   fluticasone (FLONASE) 50 MCG/ACT nasal spray Use 2 spray(s) in each nostril once daily 16 g 0   hydrocortisone 2.5 % ointment Apply topically 2 (two) times daily. To corners of mouth for up to 1 week. 20 g 0   hydrOXYzine (VISTARIL) 25 MG capsule  TAKE 1 CAPSULE BY MOUTH THREE TIMES DAILY AS NEEDED FOR ANXIETY 90 capsule 2   montelukast (SINGULAIR) 10 MG tablet Take 1 tablet (10 mg total) by mouth at bedtime. 90 tablet 4   omeprazole (PRILOSEC) 20 MG capsule Take 1 capsule (20 mg total) by mouth 2 (two) times daily before a meal. 180 capsule 4   sertraline (ZOLOFT) 100 MG tablet TAKE **Note De-Identified [place] Obfuscation** 2 TABLETS BY MOUTH ONCE DAILY 180 tablet 4   tiZANidine (ZANAFLEX) 4 MG tablet Take 1 tablet (4 mg total) by mouth every 6 (six) hours as needed for muscle spasms. 60 tablet 4   ALPRAZolam (XANAX) 0.5 MG tablet Take one tablet 60 minutes prior to dental procedures and if needed may repeat x one tablet 30 minutes prior to procedure. 4 tablet 0   amitriptyline (ELAVIL) 10 MG tablet Take 1 tablet (10 mg total) by mouth at bedtime. 90 tablet 4   [START ON 07/14/2021] Zoster Vaccine Adjuvanted [facility]) injection Inject 0.5 mLs into the muscle once for 1 dose. Dose # 2 (Patient not taking: No sig reported) 0.5 mL 0   No facility-administered medications prior to visit.    Allergies  Allergen Reactions   Doxycycline Nausea And Vomiting   Elemental Sulfur Diarrhea and Nausea And Vomiting    ROS Review of Systems  Constitutional:  Positive for fatigue.  HENT:  Positive for dental problem (recent top teeth pulled).   Respiratory: Negative.    Cardiovascular: Negative.   Gastrointestinal: Negative.   Musculoskeletal:  Positive for neck pain.  Skin: Negative.   Neurological:  Positive for headaches. Negative for dizziness.     Objective:    Physical Exam Vitals and nursing note reviewed.  Constitutional:      General: She is not in acute distress.    Appearance: Normal appearance.  HENT:     Head: Normocephalic and atraumatic.  Eyes:     Conjunctiva/sclera: Conjunctivae normal.  Cardiovascular:     Rate and Rhythm: Normal rate and regular rhythm.     Pulses: Normal pulses.     Heart sounds: Normal heart sounds.  Pulmonary:     Effort: Pulmonary  effort is normal.     Breath sounds: Normal breath sounds.  Musculoskeletal:     Cervical back: Normal range of motion.  Skin:    General: Skin is warm and dry.  Neurological:     General: No focal deficit present.     Mental Status: She is alert and oriented to person, place, and time.  Psychiatric:        Mood and Affect: Mood normal.        Behavior: Behavior normal.        Thought Content: Thought content normal.        Judgment: Judgment normal.    BP (!) 143/84 (BP Location: Left Arm, Cuff Size: Small)   Pulse 66   Temp 98.7 F (37.1 C) (Oral)   Ht 5' 0.4" (1.534 m)   Wt 95 lb 12.8 oz (43.5 kg)   SpO2 98%   BMI 18.46 kg/m  Wt Readings from Last 3 Encounters:  05/20/21 95 lb 12.8 oz (43.5 kg)  04/17/21 98 lb 9.6 oz (44.7 kg)  01/10/21 100 lb 6.4 oz (45.5 kg)     Health Maintenance Due  Topic Date Due   MAMMOGRAM  Never done    There are no preventive care reminders to display for this patient.  Lab Results  Component Value Date   TSH 1.070 01/10/2021   Lab Results  Component Value Date   WBC 7.6 01/10/2021   HGB 14.6 01/10/2021   HCT 43.1 01/10/2021   MCV 95 01/10/2021   PLT 302 01/10/2021   Lab Results  Component Value Date   NA 140 01/10/2021   K 4.2 01/10/2021   CO2 21 01/10/2021   GLUCOSE 86 01/10/2021   BUN 12 01/10/2021 **Note De-Identified [place] Obfuscation** CREATININE 0.82 01/10/2021   BILITOT 0.4 01/10/2021   ALKPHOS 129 (H) 01/10/2021   AST 22 01/10/2021   ALT 17 01/10/2021   PROT 7.1 01/10/2021   ALBUMIN 4.4 01/10/2021   CALCIUM 9.6 01/10/2021   ANIONGAP 12 08/10/2018   EGFR 81 01/10/2021   Lab Results  Component Value Date   CHOL 218 (H) 01/10/2021   Lab Results  Component Value Date   HDL 56 01/10/2021   Lab Results  Component Value Date   LDLCALC 110 (H) 01/10/2021   Lab Results  Component Value Date   TRIG 304 (H) 01/10/2021   No results found for: CHOLHDL No results found for: HGBA1C    Assessment & Plan:   Problem List Items Addressed This  Visit       Other   Sinus headache - Primary    Recently had all of her top teeth pulled. She goes back to dentist for follow-up on 05/27/21. She states her headache had resolved after having her teeth pulled, but then it started to come back again. She states she feels like there is a hole into her sinus on the left side and her dentist may refer her to an oral Psychologist, sport and exercise. She is eating bland foods and liquids. Will increase her amitriptyline to 47m qHS to help with headaches and stress. Discussed signs to watch for with serotonin syndrome as she is also taking zoloft. She has an appointment with her PCP on 07/14/21 and encouraged her to keep this appointment or sooner with any concerns.       Relevant Medications   amitriptyline (ELAVIL) 25 MG tablet    Meds ordered this encounter  Medications   amitriptyline (ELAVIL) 25 MG tablet    Sig: Take 1 tablet (25 mg total) by mouth at bedtime.    Dispense:  30 tablet    Refill:  2     Follow-up: Return if symptoms worsen or fail to improve, for keep next scheduled appointment with Jolene.    LCharyl Dancer NP

## 2021-05-21 ENCOUNTER — Ambulatory Visit: Payer: Self-pay

## 2021-05-21 ENCOUNTER — Telehealth: Payer: 59 | Admitting: General Practice

## 2021-05-21 DIAGNOSIS — J432 Centrilobular emphysema: Secondary | ICD-10-CM

## 2021-05-21 DIAGNOSIS — J449 Chronic obstructive pulmonary disease, unspecified: Secondary | ICD-10-CM

## 2021-05-21 DIAGNOSIS — F321 Major depressive disorder, single episode, moderate: Secondary | ICD-10-CM

## 2021-05-21 DIAGNOSIS — F411 Generalized anxiety disorder: Secondary | ICD-10-CM

## 2021-05-21 DIAGNOSIS — F17200 Nicotine dependence, unspecified, uncomplicated: Secondary | ICD-10-CM

## 2021-05-21 DIAGNOSIS — F17210 Nicotine dependence, cigarettes, uncomplicated: Secondary | ICD-10-CM

## 2021-05-21 DIAGNOSIS — R519 Headache, unspecified: Secondary | ICD-10-CM

## 2021-05-21 NOTE — Chronic Care Management (AMB) (Signed)
**Note De-Identified Strickling Obfuscation** Care Management    RN Visit Note  05/21/2021 Name: Haley [name] MRN: [id number] DOB: [birth year]  Subjective: Haley [name] is a 60 y.o. year old female who is a primary care patient of Cannady, Barbaraann Faster, NP. The care management team was consulted for assistance with disease management and care coordination needs.    Engaged with patient by telephone for initial visit in response to provider referral for case management and/or care coordination services.   Consent to Services:   Haley [name] was given information about Care Management services today including:  Care Management services includes personalized support from designated clinical staff supervised by her physician, including individualized plan of care and coordination with other care providers 24/7 contact phone numbers for assistance for urgent and routine care needs. The patient may stop case management services at any time by phone call to the office staff.  Patient agreed to services and consent obtained.   Assessment: Review of patient past medical history, allergies, medications, health status, including review of consultants reports, laboratory and other test data, was performed as part of comprehensive evaluation and provision of chronic care management services.   SDOH (Social Determinants of Health) assessments and interventions performed:    Care Plan  Allergies  Allergen Reactions   Doxycycline Nausea And Vomiting   Elemental Sulfur Diarrhea and Nausea And Vomiting    Outpatient Encounter Medications as of 05/21/2021  Medication Sig   albuterol (VENTOLIN HFA) 108 (90 Base) MCG/ACT inhaler INHALE 2 PUFFS INTO LUNGS EVERY 6 HOURS AS NEEDED FOR WHEEZING OR SHORTNESS OF BREATH   amitriptyline (ELAVIL) 25 MG tablet Take 1 tablet (25 mg total) by mouth at bedtime.   fluticasone (FLONASE) 50 MCG/ACT nasal spray Use 2 spray(s) in each nostril once daily   hydrocortisone 2.5 % ointment Apply topically 2 (two) times daily.  To corners of mouth for up to 1 week.   hydrOXYzine (VISTARIL) 25 MG capsule TAKE 1 CAPSULE BY MOUTH THREE TIMES DAILY AS NEEDED FOR ANXIETY   montelukast (SINGULAIR) 10 MG tablet Take 1 tablet (10 mg total) by mouth at bedtime.   omeprazole (PRILOSEC) 20 MG capsule Take 1 capsule (20 mg total) by mouth 2 (two) times daily before a meal.   sertraline (ZOLOFT) 100 MG tablet TAKE 2 TABLETS BY MOUTH ONCE DAILY   tiZANidine (ZANAFLEX) 4 MG tablet Take 1 tablet (4 mg total) by mouth every 6 (six) hours as needed for muscle spasms.   [START ON 07/14/2021] Zoster Vaccine Adjuvanted [facility]) injection Inject 0.5 mLs into the muscle once for 1 dose. Dose # 2 (Patient not taking: No sig reported)   No facility-administered encounter medications on file as of 05/21/2021.    Patient Active Problem List   Diagnosis Date Noted   Sinus headache 04/17/2021   Financial difficulties 04/17/2021   Skin lesion 01/10/2021   Elevated low density lipoprotein (LDL) cholesterol level 01/10/2021   Aortic atherosclerosis (HCC) 02/09/2020   Cigarette nicotine dependence without complication 38/93/7342   Insomnia 05/01/2019   Depression, major, single episode, moderate (HCC) 02/14/2018   Allergic rhinitis 02/14/2018   IBS (irritable bowel syndrome) 02/02/2017   Generalized anxiety disorder 02/02/2017   Benign neoplasm of sigmoid colon    GERD (gastroesophageal reflux disease) 01/04/2016   Vitamin D deficiency disease    Centrilobular emphysema ([place])    Osteopenia 06/03/2014    Conditions to be addressed/monitored: COPD, Anxiety, Depression, Tobacco Use, and Chronic pain- headache  Care Plan : RNCM: General Plan **Note De-Identified [place] Obfuscation** of Care (Adult) Chronic Disease Managment and Care Coordination Needs  Updates made by Vanita Ingles, RN since 05/21/2021 12:00 AM     Problem: RNCM: Development of Plan of care for Chronic Disease Management (Anxiety, Depression, Chronic Headaches, COPD, Caregiver stress, smoker)   Priority: High      Long-Range Goal: RNCM: Development of Plan of care for Chronic Disease Management (Anxiety, Depression, Chronic Headaches, COPD, Caregiver stress, smoker)   Start Date: 05/21/2021  Expected End Date: 05/21/2022  Priority: High  Note:   Current Barriers:  Knowledge Deficits related to plan of care for management of COPD, Anxiety with Excessive Worry, Social Anxiety,, Depression: depressed mood anxiety insomnia disturbed sleep, Caregiver Stress, Insomnia/Sleep Difficulties, and Stress at concerns over housing and other financial difficulties, and chronic headache pain and smoker  Care Coordination needs related to Financial constraints related to housing and other essential needs, Limited social support, Transportation, Housing barriers, Mental Health Concerns , Family and relationship dysfunction, Substance abuse issues -  smoker 35 years, and Social Isolation  Chronic Disease Management support and education needs related to COPD, Anxiety with Excessive Worry, Social Anxiety,, Depression: depressed mood anxiety insomnia disturbed sleep, Caregiver Stress, and Insomnia/Sleep Difficulties, and smoker, chronic headache pain Lacks caregiver support.  Film/video editor.  Transportation barriers  RNCM Clinical Goal(s):  Patient will verbalize understanding of plan for management of COPD, Anxiety, Depression, Tobacco Use, and chronic pain   verbalize basic understanding of COPD, Anxiety, Depression, Tobacco Use, and chronic headache pain disease process and self health management plan   take all medications exactly as prescribed and will call provider for medication related questions demonstrate understanding of rationale for each prescribed medication   attend all scheduled medical appointments: 07-14-2021 at 240 pm demonstrate improved and ongoing adherence to prescribed treatment plan for COPD, Anxiety, Depression, Tobacco Use, and chronic headache pain  as evidenced by adherence to  prescribed medication regimen contacting provider for new or worsened symptoms or questions   demonstrate improved and ongoing health management independence for effective management of chronic diseases continue to work with Consulting civil engineer and/or Social Worker to address care management and care coordination needs related to COPD, Anxiety, Depression, Tobacco Use, and chronic headache pain   demonstrate a decrease in COPD, Anxiety, Depression, Tobacco Use, and chronic headache pain  exacerbations   demonstrate ongoing self health care management ability    through collaboration with RN Care manager, provider, and care team.   Interventions: 1:1 collaboration with primary care provider regarding development and update of comprehensive plan of care as evidenced by provider attestation and co-signature Inter-disciplinary care team collaboration (see longitudinal plan of care) Evaluation of current treatment plan related to  self management and patient's adherence to plan as established by provider   SDOH Barriers (Status: New goal. Goal on track: YES.)  Patient interviewed and SDOH assessment performed        Patient interviewed and appropriate assessments performed Provided patient with information about the CCM team and care guides to assist with available resources as needed  Discussed plans with patient for ongoing care management follow up and provided patient with direct contact information for care management team Advised patient to call the office for changes in [place], questions, or concerns Provided education to patient/caregiver regarding level of care options.    COPD: (Status: New goal.) Reviewed medications with patient, including use of prescribed maintenance and rescue inhalers, and provided instruction on medication management and the importance of adherence Provided patient with **Note De-Identified [place] Obfuscation** basic written and verbal COPD education on self care/management/and exacerbation  prevention; Advised patient to track and manage COPD triggers;  Provided written and verbal instructions on pursed lip breathing and utilized returned demonstration as teach back; Provided instruction about proper use of medications used for management of COPD including inhalers; Advised patient to self assesses COPD action plan zone and make appointment with provider if in the yellow zone for 48 hours without improvement; Advised patient to engage in light exercise as tolerated 3-5 days a week to aid in the the management of COPD; Provided education about and advised patient to utilize infection prevention strategies to reduce risk of respiratory infection; Discussed the importance of adequate rest and management of fatigue with COPD;  Depression and Anxiety  (Status: New goal.) Evaluation of current treatment plan related to Anxiety and Depression, Financial constraints related to housing and ability to afford essentials, the patient is also the primary caregiver of her elderly mother, Limited social support, Transportation, Housing barriers, Mental Health Concerns , Family and relationship dysfunction, and Substance abuse issues -  smoker  self-management and patient's adherence to plan as established by provider. Discussed plans with patient for ongoing care management follow up and provided patient with direct contact information for care management team Advised patient to call the office for changes in mood, anxiety, or depression; Provided education to patient re: CCM team to support the patient in maintaining her health and well being and provide education for chronic conditions and help in the management of her health and well being; Reviewed medications with patient and discussed compliance. The patient states she is compliant with her medicaitons; Collaborated with LCSW regarding ongoing support and education for the patients anxiety and depression and other states needs. Review of care  giver stress and recommended the 36 hour day for the patient in help with being the caregiver of her elderly mother who has dementia; Provided patient with mindfullness, and caregiver stress  educational materials related to increased stressors in her life and being care giver to her elderly mother with dementia; Reviewed scheduled/upcoming provider appointments including 07-14-2021 at 240 pm; Discussed plans with patient for ongoing care management follow up and provided patient with direct contact information for care management team; Screening for signs and symptoms of depression related to chronic disease state;  Assessed social determinant of health barriers;   Pain:  (Status: New goal.) Pain assessment performed Medications reviewed Reviewed provider established plan for pain management; Discussed importance of adherence to all scheduled medical appointments; Counseled on the importance of reporting any/all new or changed pain symptoms or management strategies to pain management provider; Advised patient to report to care team affect of pain on daily activities; Discussed use of relaxation techniques and/or diversional activities to assist with pain reduction (distraction, imagery, relaxation, massage, acupressure, TENS, heat, and cold application; Reviewed with patient prescribed pharmacological and nonpharmacological pain relief strategies; Advised patient to discuss unresolved pain, increase in intensity or level of pain with provider;   Smoking Cessation: (Status: New goal.) Reviewed smoking history:  tobacco abuse of 35 years; currently smoking 1 ppd Previous quit attempts, unsuccessful 0 successful using "several"  Reports smoking within 30 minutes of waking up Reports triggers to smoke include: stress in her life, being caregiver to elderly parent, own health concerns Reports motivation to quit smoking includes: knows she has before and can again, knows it would help her  breathing  On a scale of 1-10, reports MOTIVATION to quit is 1 On a scale **Note De-Identified Tegethoff Obfuscation** of 1-10, reports CONFIDENCE in quitting is 5  Evaluation of current treatment plan reviewed. 05-21-2021: The patient states she has quit before but she is not interested in quitting right now. She states that smoking helps her relax and helps her stress level. Education provided that if she is interested in smoking cessation to please let the [facility] and CCM team know for added support and educaiton Advised patient to discuss smoking cessation options with provider; Provided contact information for [place] Quit Line (1-800-QUIT-NOW); Provided patient with printed smoking cessation educational materials; Reviewed scheduled/upcoming provider appointments including: 07-14-2021 at 240 pm; Discussed plans with patient for ongoing care management follow up and provided patient with direct contact information for care management team;  Patient Goals/Self-Care Activities: Patient will self administer medications as prescribed as evidenced by self report/primary caregiver report  Patient will attend all scheduled provider appointments as evidenced by clinician review of documented attendance to scheduled appointments and patient/caregiver report Patient will call pharmacy for medication refills as evidenced by patient report and review of pharmacy fill history as appropriate Patient will attend church or other social activities as evidenced by patient report Patient will continue to perform ADL's independently as evidenced by patient/caregiver report Patient will continue to perform IADL's independently as evidenced by patient/caregiver report Patient will call provider office for new concerns or questions as evidenced by review of documented incoming telephone call notes and patient report Patient will work with BSW to address care coordination needs and will continue to work with the clinical team to address health care and disease  management related needs as evidenced by documented adherence to scheduled care management/care coordination appointments       Plan: Telephone follow up appointment with care management team member scheduled for:  07-23-2021 at 1 pm  [place], MSN, [facility] Mobile: [phone]

## 2021-05-21 NOTE — Patient Instructions (Signed)
**Note De-Identified Vanduyne Obfuscation** Visit Information  PATIENT GOALS:   Goals Addressed             This Visit's Progress    RNCM: Manage Chronic Pain       Timeframe:  Long-Range Goal Priority:  High Start Date:        05-21-2021                     Expected End Date:    05-21-2022                   Follow Up Date 07/19/2021    - call for medicine refill 2 or 3 days before it runs out - develop a personal pain management plan - keep track of prescription refills - plan exercise or activity when pain is best controlled - prioritize tasks for the day - track times pain is worst and when it is best - track what makes the pain worse and what makes it better - use ice or heat for pain relief - work slower and less intense when having pain    Why is this important?   Day-to-day life can be hard when you have chronic pain.  Pain medicine is just one piece of the treatment puzzle.  You can try these action steps to help you manage your pain.    05-21-2021: The patient has been having headache pain. Some better after having teeth removed but now happening again. Feels it is her sinuses as she has some areas in the top of her mouth that would allow accumulation of fluids and cause sinus pressure. Goes back to the dentist on the 25th and may be referred to an oral surgeon. The patient saw pcp yesterday. States pain level is at a 3 today. Will continue to monitor.      RNCM: Stop or Cut Down Tobacco Use       Timeframe:  Long-Range Goal Priority:  High Start Date:        05-21-2021                     Expected End Date:   05-21-2022                    Follow Up Date 07/23/2021    - change or avoid triggers like smoky places, drinking alcohol and other smokers - cut down amount of tobacco product used (chew, cigars) - cut down number of cigarettes by one-half - drink 4-6 glasses of water each day - join a support group - use over-the-counter gum, patch or lozenges - use prescription medicine to decrease cravings  and withdrawal - use prescription nicotine replacement - use Quit Line 1-800-Quit Now    Why is this important?   To stop or cut down it is important to have support from a person or group of people who you can count on.  You will also need to think about the things that make you feel like smoking, then plan for how to handle them.    05-21-2021: The patient states she has quit before but smoking helps her with her stress level right now. Denies wanting to quit at this time. Education and support given.      RNCM: Track and Manage My Symptoms-Depression       Timeframe:  Long-Range Goal Priority:  High Start Date:    05-21-2021 **Note De-Identified Swilling Obfuscation** Expected End Date:         05-21-2022              Follow Up Date 07/19/2021    - avoid negative self-talk - develop a personal safety plan - develop a plan to deal with triggers like holidays, anniversaries - exercise at least 2 to 3 times per week - have a plan for how to handle bad days - journal feelings and what helps to feel better or worse - spend time or talk with others at least 2 to 3 times per week - spend time or talk with others every day - watch for early signs of feeling worse - write in journal every day    Why is this important?   Keeping track of your progress will help your treatment team find the right mix of medicine and therapy for you.  Write in your journal every day.  Day-to-day changes in depression symptoms are normal. It may be more helpful to check your progress at the end of each week instead of every day.     05-21-2021: The patient feels like she is a strong person. 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 States she does not have any support. Recommended the book 36 hour day for a resources for caregivers caring for patients with dementia. The patient states that she has had hard times before but she gets through them. Is working on a solution to housing and thinks it will  work out for her okay. She feels her depression and anxiety are stable at this time. Also working with LCSW. Knows care guides are available for resources.         Ms. Peake was given information about Care Management services by the embedded care coordination team including:  Care Management services include personalized support from designated clinical staff supervised by her physician, including individualized plan of care and coordination with other care providers 24/7 contact phone numbers for assistance for urgent and routine care needs. The patient may stop CCM services at any time (effective at the end of the month) by phone call to the office staff.  Patient agreed to services and verbal consent obtained.   Patient verbalizes understanding of instructions provided today and agrees to view in [place].   Telephone follow up appointment with care management team member scheduled for: 07-23-2021 at 1 pm  Noreene Larsson RN, MSN, [facility] Mobile: [phone]

## 2021-05-26 ENCOUNTER — Other Ambulatory Visit: Payer: Self-pay | Admitting: *Deleted

## 2021-05-26 DIAGNOSIS — Z87891 Personal history of nicotine dependence: Secondary | ICD-10-CM

## 2021-05-26 DIAGNOSIS — F17210 Nicotine dependence, cigarettes, uncomplicated: Secondary | ICD-10-CM

## 2021-06-02 ENCOUNTER — Ambulatory Visit: Payer: 59

## 2021-06-02 ENCOUNTER — Telehealth: Payer: Self-pay | Admitting: Licensed Clinical Social Worker

## 2021-06-02 ENCOUNTER — Telehealth: Payer: 59

## 2021-06-02 NOTE — Telephone Encounter (Signed)
**Note De-identified Drennan Obfuscation**    **Note De-Identified Renfrow Obfuscation** Clinical Social Work  Care Management   Phone Outreach    06/02/2021 Name: Haley [name] MRN: [id number] DOB: 04-28-1961  Haley [name] is a 60 y.o. year old female who is a primary care patient of Cannady, Barbaraann Faster, NP .   Reason for referral: Mental Health Counseling and Resources.    F/U phone call today to assess needs, progress and barriers with care plan goals.   Telephone outreach was unsuccessful. A HIPPA compliant phone message was left for the patient providing contact information and requesting a return call.   Plan:CCM LCSW will wait for return call. If no return call is received, Will reach out to patient again in the next 30 days .   Review of patient status, including review of consultants reports, relevant laboratory and other test results, and collaboration with appropriate care team members and the patient's provider was performed as part of comprehensive patient evaluation and provision of care management services.    Christa See, MSW, [email or web address] Phone [phone] 5:13 PM

## 2021-06-03 ENCOUNTER — Other Ambulatory Visit: Payer: Self-pay | Admitting: Nurse Practitioner

## 2021-06-23 ENCOUNTER — Ambulatory Visit: Admission: RE | Admit: 2021-06-23 | Payer: 59 | Source: Ambulatory Visit

## 2021-07-04 ENCOUNTER — Telehealth: Payer: 59

## 2021-07-07 ENCOUNTER — Ambulatory Visit: Payer: 59 | Admitting: Nurse Practitioner

## 2021-07-08 ENCOUNTER — Telehealth: Payer: Self-pay | Admitting: Licensed Clinical Social Worker

## 2021-07-08 NOTE — Telephone Encounter (Signed)
**Note De-identified [place] Obfuscation**    **Note De-Identified Goelz Obfuscation** Clinical Social Work  Care Management   Phone Outreach    07/08/2021 Name: Haley [name] MRN: [id number] DOB: November 16, 1960  Haley [name] is a 60 y.o. year old female who is a primary care patient of Cannady, Barbaraann Faster, NP .   Reason for referral: Mental Health Counseling and Resources.    F/U phone call today to assess needs, progress and barriers with care plan goals.   Telephone outreach was unsuccessful. Unable to leave a HIPPA compliant phone message due to voice mail not set up.  Plan:CCM LCSW will wait for return call. If no return call is received, Will route chart to Care Guide to see if patient would like to reschedule phone appointment   Review of patient status, including review of consultants reports, relevant laboratory and other test results, and collaboration with appropriate care team members and the patient's provider was performed as part of comprehensive patient evaluation and provision of care management services.     Christa See, MSW, [email or web address] Phone [phone] 6:29 AM

## 2021-07-14 ENCOUNTER — Ambulatory Visit (INDEPENDENT_AMBULATORY_CARE_PROVIDER_SITE_OTHER): Payer: 59 | Admitting: Nurse Practitioner

## 2021-07-14 ENCOUNTER — Other Ambulatory Visit: Payer: Self-pay

## 2021-07-14 ENCOUNTER — Encounter: Payer: Self-pay | Admitting: Nurse Practitioner

## 2021-07-14 VITALS — BP 134/80 | HR 90 | Temp 98.5°F | Wt 99.8 lb

## 2021-07-14 DIAGNOSIS — J0101 Acute recurrent maxillary sinusitis: Secondary | ICD-10-CM | POA: Diagnosis not present

## 2021-07-14 DIAGNOSIS — I700 Atherosclerosis of aorta: Secondary | ICD-10-CM

## 2021-07-14 DIAGNOSIS — F321 Major depressive disorder, single episode, moderate: Secondary | ICD-10-CM | POA: Diagnosis not present

## 2021-07-14 DIAGNOSIS — E7800 Pure hypercholesterolemia, unspecified: Secondary | ICD-10-CM

## 2021-07-14 DIAGNOSIS — F411 Generalized anxiety disorder: Secondary | ICD-10-CM

## 2021-07-14 DIAGNOSIS — J432 Centrilobular emphysema: Secondary | ICD-10-CM | POA: Diagnosis not present

## 2021-07-14 DIAGNOSIS — F17210 Nicotine dependence, cigarettes, uncomplicated: Secondary | ICD-10-CM

## 2021-07-14 MED ORDER — PREDNISONE 10 MG PO TABS: ORAL_TABLET | ORAL | 0 refills | Status: DC

## 2021-07-14 MED ORDER — AMOXICILLIN-POT CLAVULANATE 875-125 MG PO TABS: 1.0000 | ORAL_TABLET | Freq: Two times a day (BID) | ORAL | 0 refills | Status: AC

## 2021-07-14 MED ORDER — METHYLPREDNISOLONE SODIUM SUCC 40 MG IJ SOLR
40.0000 mg | Freq: Once | INTRAMUSCULAR | Status: AC
  Administered 2021-07-14: 40 mg via INTRAMUSCULAR

## 2021-07-14 NOTE — Assessment & Plan Note (Signed)
**Note De-Identified [place]co Obfuscation** Chronic, ongoing.  Denies SI/HI.  Will continue Sertraline 200 MG, offering her benefit at this time.  This benefits both anxiety and depression elements.  Continue Vistaril as needed, consider change to Buspar in future.   Follow-up in 6 months.

## 2021-07-14 NOTE — Assessment & Plan Note (Signed)
**Note De-Identified Brandenberger Obfuscation** Chronic, stable. Continue Albuterol inhaler PRN and Singulair daily.  Adjust inhaler regimen as needed, consider maintenance if worsening.  Consider spirometry at next visit.  No recent exacerbations.

## 2021-07-14 NOTE — Assessment & Plan Note (Signed)
**Note De-Identified Freitas Obfuscation** Noted on lung screening in July 2021.  Will continue to recommend initiation of statin and daily baby ASA.  Recommend complete cessation of smoking.

## 2021-07-14 NOTE — Assessment & Plan Note (Signed)
**Note De-Identified Sipe Obfuscation** Ongoing since removal of teeth and injury.  Concern for worsening infection at this time, will send in Prednisone taper and Augmentin + provide dose of Solu Medrol in office today due to discomfort and inflammation.  Referral to ENT placed for further evaluation and maintain visit with dental surgeon upcoming.  Recommend mouth rinses frequently at home.  CBC and BMP today.

## 2021-07-14 NOTE — Progress Notes (Signed)
**Note De-Identified [place] Obfuscation** BP 134/80 (BP Location: Left Arm, Patient Position: Sitting)   Pulse 90   Temp 98.5 F (36.9 C)   Wt 99 lb 12.8 oz (45.3 kg)   SpO2 97%   BMI 19.23 kg/m    Subjective:    Patient ID: Haley [name], female    DOB: 08-27-1960, 60 y.o.   MRN: [id number]  HPI: Haley [name] is a 60 y.o. female  Chief Complaint  Patient presents with   Mood   COPD   Hyperlipidemia   Sinusitis    Patient states she had all her upper teeth pulled and they perforated her sinuses. Patient states she is having really thick stuff and it is coming through her gums and states she is having head and neck pain. Patient states her symptoms started a couple of weeks ago and started off as like a simple cold. Patient states she has tried the Netti-Pot and Mucinex and states nothing is helping her. Patient states the drainage is making her nauseated.    SINUS ISSUES Ongoing intermittent issues with sinuses since having all teeth pulled, reports they perforated sinuses during procedure -- they have referred to dental surgeon -- to see on 21st December.  Taking Mucinex and using Neti Pot.  Has not seen ENT as of yet.  Last seen for infection 05/20/21 without abx treatment. Fever: no Cough: occasional Shortness of breath: no Wheezing: no Chest pain: no Chest tightness: yes Chest congestion: yes Nasal congestion: yes Runny nose: yes Post nasal drip: yes Sneezing: no Sore throat: no Swollen glands: no Sinus pressure: yes Headache: yes Face pain: yes Toothache: yes Ear pain: yes bilateral Ear pressure: yes bilateral Eyes red/itching:no Eye drainage/crusting: no  Vomiting: no Rash: no Fatigue: yes Sick contacts: no Strep contacts: no  Context: worse Recurrent sinusitis: yes Relief with OTC cold/cough medications: no  Treatments attempted: mucinex    COPD Continues on Albuterol as needed + Flonase and Singulair.   Continues to smoke 1/2 to 1 PPD, does endorse needing to quit but not at this time.   Has quit cold [place] in past, but this caused irritability.  She started smoking at age 80.  Had initial annual lung screening on 02/08/2020 -- this noted aortic atherosclerosis and emphysema. COPD status: stable Satisfied with current treatment?: yes Oxygen use: no Dyspnea frequency: very rare Cough frequency: none Rescue inhaler frequency:  rarely Limitation of activity: no Productive cough:  Last Spirometry: 06/16/2016 Pneumovax: Up to Date Influenza: Up to Date    HYPERLIPIDEMIA No current medications at this time. Hyperlipidemia status: good compliance Satisfied with current treatment?  yes Side effects:  no Medication compliance: good compliance Past cholesterol meds: none Supplements: none Aspirin:  no The 10-year ASCVD risk score (Arnett DK, et al., 2019) is: 8.5%   Values used to calculate the score:     Age: 42 years     Sex: Female     Is Non-Hispanic African American: No     Diabetic: No     Tobacco smoker: Yes     Systolic Blood Pressure: 891 mmHg     Is BP treated: No     HDL Cholesterol: 56 mg/dL     Total Cholesterol: 218 mg/dL Chest pain:  no Coronary artery disease:  no Family history CAD:  no Family history early CAD:  no    DEPRESSION Continues on Sertraline 200 MG + continues Vistaril as needed. Takes Trazodone 50 MG for sleep.  Reports winter time is worst period. **Note De-Identified Morales Obfuscation** Mood status: stable Satisfied with current treatment?: yes Symptom severity: mild  Duration of current treatment : chronic Side effects: no Medication compliance: good compliance Psychotherapy/counseling: none Previous psychiatric medications: Zoloft, Trazodone, Vistaril Depressed mood: no Anxious mood: no Anhedonia: no Significant weight loss or gain: no Insomnia: none Fatigue: no Feelings of worthlessness or guilt: no Impaired concentration/indecisiveness: no Suicidal ideations: no Hopelessness: no Crying spells: no Depression screen [facility] 2/9 07/14/2021 04/17/2021 01/10/2021  09/17/2020 01/08/2020  Decreased Interest 2 0 0 3 1  Down, Depressed, Hopeless 2 0 0 2 0  PHQ - 2 Score 4 0 0 5 1  Altered sleeping 2 3 1 3 1   Tired, decreased energy 2 3 0 3 3  Change in appetite 0 0 0 1 0  Feeling bad or failure about yourself  0 0 0 0 0  Trouble concentrating 0 0 0 0 0  Moving slowly or fidgety/restless 0 0 0 0 0  Suicidal thoughts 0 0 0 0 0  PHQ-9 Score 8 6 1 12 5   Difficult doing work/chores Somewhat difficult Not difficult at all Not difficult at all - Not difficult at all  Some recent data might be hidden    GAD 7 : Generalized Anxiety Score 07/14/2021 09/17/2020 05/26/2019 05/01/2019  Nervous, Anxious, on Edge 1 3 3 3   Control/stop worrying 0 1 2 2   Worry too much - different things 1 3 3 3   Trouble relaxing 1 3 3 3   Restless 1 2 0 2  Easily annoyed or irritable 3 3 2 3   Afraid - awful might happen 0 3 0 1  Total GAD 7 Score 7 18 13 17   Anxiety Difficulty Somewhat difficult - Somewhat difficult Somewhat difficult    Relevant past medical, surgical, family and social history reviewed and updated as indicated. Interim medical history since our last visit reviewed. Allergies and medications reviewed and updated.  Review of Systems  Constitutional:  Positive for fatigue. Negative for activity change, appetite change, chills, diaphoresis and fever.  HENT:  Positive for congestion, postnasal drip, rhinorrhea, sinus pressure and sinus pain. Negative for ear discharge, ear pain, facial swelling, sneezing, sore throat and voice change.   Eyes:  Negative for pain and visual disturbance.  Respiratory:  Positive for cough and chest tightness. Negative for shortness of breath and wheezing.   Cardiovascular:  Negative for chest pain, palpitations and leg swelling.  Gastrointestinal: Negative.   Endocrine: Negative.   Neurological:  Positive for headaches. Negative for dizziness, weakness and numbness.  Psychiatric/Behavioral:  Positive for sleep disturbance. Negative for  decreased concentration, self-injury and suicidal ideas. The patient is nervous/anxious.    Per HPI unless specifically indicated above     Objective:    BP 134/80 (BP Location: Left Arm, Patient Position: Sitting)   Pulse 90   Temp 98.5 F (36.9 C)   Wt 99 lb 12.8 oz (45.3 kg)   SpO2 97%   BMI 19.23 kg/m   Wt Readings from Last 3 Encounters:  07/14/21 99 lb 12.8 oz (45.3 kg)  05/20/21 95 lb 12.8 oz (43.5 kg)  04/17/21 98 lb 9.6 oz (44.7 kg)    Physical Exam Vitals and nursing note reviewed.  Constitutional:      General: She is awake. She is not in acute distress.    Appearance: She is well-developed and well-groomed. She is not ill-appearing or toxic-appearing.  HENT:     Head: Normocephalic.     Right Ear: Hearing, ear canal and external ear **Note De-Identified Mangen Obfuscation** normal. A middle ear effusion is present.     Left Ear: Hearing, ear canal and external ear normal. A middle ear effusion is present.     Nose: Rhinorrhea present. Rhinorrhea is clear.     Right Sinus: Maxillary sinus tenderness and frontal sinus tenderness present.     Left Sinus: Maxillary sinus tenderness and frontal sinus tenderness present.     Mouth/Throat:     Lips: Pink.     Mouth: Mucous membranes are moist.     Dentition: Abnormal dentition.     Pharynx: Oropharynx is clear.     Comments: Upper left gum with small opening from previous dental removal, scant whitish drainage noted to area.   Eyes:     General: Lids are normal.        Right eye: No discharge.        Left eye: No discharge.     Conjunctiva/sclera: Conjunctivae normal.     Pupils: Pupils are equal, round, and reactive to light.  Neck:     Thyroid: No thyromegaly.     Vascular: No carotid bruit.  Cardiovascular:     Rate and Rhythm: Normal rate and regular rhythm.     Heart sounds: Normal heart sounds. No murmur heard.   No gallop.  Pulmonary:     Effort: Pulmonary effort is normal. No accessory muscle usage or respiratory distress.     Breath sounds:  Normal breath sounds.  Abdominal:     General: Bowel sounds are normal.     Palpations: Abdomen is soft.     Tenderness: There is no abdominal tenderness.  Musculoskeletal:     Cervical back: Normal range of motion and neck supple.     Right lower leg: No edema.     Left lower leg: No edema.  Skin:    General: Skin is warm and dry.  Neurological:     Mental Status: She is alert and oriented to person, place, and time.     Deep Tendon Reflexes: Reflexes are normal and symmetric.     Reflex Scores:      Brachioradialis reflexes are 2+ on the right side and 2+ on the left side.      Patellar reflexes are 2+ on the right side and 2+ on the left side. Psychiatric:        Attention and Perception: Attention normal.        Mood and Affect: Mood normal.        Behavior: Behavior normal. Behavior is cooperative.        Thought Content: Thought content normal.        Judgment: Judgment normal.   Results for orders placed or performed in visit on 01/10/21  Hepatitis C antibody  Result Value Ref Range   Hep C Virus Ab <0.1 0.0 - 0.9 s/co ratio  Comprehensive metabolic panel  Result Value Ref Range   Glucose 86 65 - 99 mg/dL   BUN 12 8 - 27 mg/dL   Creatinine, Ser 0.82 0.57 - 1.00 mg/dL   eGFR 81 >59 mL/min/1.73   BUN/Creatinine Ratio 15 12 - 28   Sodium 140 134 - 144 mmol/L   Potassium 4.2 3.5 - 5.2 mmol/L   Chloride 103 96 - 106 mmol/L   CO2 21 20 - 29 mmol/L   Calcium 9.6 8.7 - 10.3 mg/dL   Total Protein 7.1 6.0 - 8.5 g/dL   Albumin 4.4 3.8 - 4.8 g/dL   Globulin, Total 2.7 1.5 - **Note De-Identified Granito Obfuscation** 4.5 g/dL   Albumin/Globulin Ratio 1.6 1.2 - 2.2   Bilirubin Total 0.4 0.0 - 1.2 mg/dL   Alkaline Phosphatase 129 (H) 44 - 121 IU/L   AST 22 0 - 40 IU/L   ALT 17 0 - 32 IU/L  CBC with Differential/Platelet  Result Value Ref Range   WBC 7.6 3.4 - 10.8 x10E3/uL   RBC 4.54 3.77 - 5.28 x10E6/uL   Hemoglobin 14.6 11.1 - 15.9 g/dL   Hematocrit 43.1 34.0 - 46.6 %   MCV 95 79 - 97 fL   MCH 32.2 26.6 - 33.0  pg   MCHC 33.9 31.5 - 35.7 g/dL   RDW 13.4 11.7 - 15.4 %   Platelets 302 150 - 450 x10E3/uL   Neutrophils 67 Not Estab. %   Lymphs 25 Not Estab. %   Monocytes 6 Not Estab. %   Eos 1 Not Estab. %   Basos 1 Not Estab. %   Neutrophils Absolute 5.1 1.4 - 7.0 x10E3/uL   Lymphocytes Absolute 1.9 0.7 - 3.1 x10E3/uL   Monocytes Absolute 0.5 0.1 - 0.9 x10E3/uL   EOS (ABSOLUTE) 0.1 0.0 - 0.4 x10E3/uL   Basophils Absolute 0.0 0.0 - 0.2 x10E3/uL   Immature Granulocytes 0 Not Estab. %   Immature Grans (Abs) 0.0 0.0 - 0.1 x10E3/uL  Lipid Panel w/o Chol/HDL Ratio  Result Value Ref Range   Cholesterol, Total 218 (H) 100 - 199 mg/dL   Triglycerides 304 (H) 0 - 149 mg/dL   HDL 56 >39 mg/dL   VLDL Cholesterol Cal 52 (H) 5 - 40 mg/dL   LDL Chol Calc (NIH) 110 (H) 0 - 99 mg/dL  TSH  Result Value Ref Range   TSH 1.070 0.450 - 4.500 uIU/mL  VITAMIN D 25 Hydroxy (Vit-D Deficiency, Fractures)  Result Value Ref Range   Vit D, 25-Hydroxy 50.2 30.0 - 100.0 ng/mL  Magnesium  Result Value Ref Range   Magnesium 1.8 1.6 - 2.3 mg/dL      Assessment & Plan:   Problem List Items Addressed This Visit       Cardiovascular and Mediastinum   Aortic atherosclerosis ([place])    Noted on lung screening in July 2021.  Will continue to recommend initiation of statin and daily baby ASA.  Recommend complete cessation of smoking.        Respiratory   Acute recurrent maxillary sinusitis    Ongoing since removal of teeth and injury.  Concern for worsening infection at this time, will send in Prednisone taper and Augmentin + provide dose of Solu Medrol in office today due to discomfort and inflammation.  Referral to ENT placed for further evaluation and maintain visit with dental surgeon upcoming.  Recommend mouth rinses frequently at home.  CBC and BMP today.      Relevant Medications   predniSONE (DELTASONE) 10 MG tablet   amoxicillin-clavulanate (AUGMENTIN) 875-125 MG tablet   Other Relevant Orders   Ambulatory  referral to ENT   Centrilobular emphysema (HCC)    Chronic, stable. Continue Albuterol inhaler PRN and Singulair daily.  Adjust inhaler regimen as needed, consider maintenance if worsening.  Consider spirometry at next visit.  No recent exacerbations.      Relevant Medications   predniSONE (DELTASONE) 10 MG tablet   Other Relevant Orders   Basic metabolic panel   CBC with Differential/Platelet     Other   Cigarette nicotine dependence without complication    I have recommended complete cessation of tobacco use. I have discussed **Note De-Identified Vrba Obfuscation** various options available for assistance with tobacco cessation including over the counter methods (Nicotine gum, patch and lozenges). We also discussed prescription options (Chantix, Nicotine Inhaler / Nasal Spray). The patient is not interested in pursuing any prescription tobacco cessation options at this time. Continue yearly lung CT screening -- reached out to team as is past due and has not heard from scheduling team.       Depression, major, single episode, moderate ([place]) - Primary    Chronic, ongoing.  Denies SI/HI.  Will continue Sertraline 200 MG, offering her benefit at this time.  This benefits both anxiety and depression elements.  Continue Vistaril as needed, consider change to Buspar in future.   Follow-up in 6 months.      Relevant Medications   traZODone (DESYREL) 50 MG tablet   Elevated low density lipoprotein (LDL) cholesterol level    Noted on recent labs, ASCVD 8.5%.  At this time continue to focus on diet changes and regular activity.  Lipid panel today and continue to recommend initiation of statin.      Relevant Orders   Lipid Panel w/o Chol/HDL Ratio   Generalized anxiety disorder    Chronic, ongoing.  Denies SI/HI.  Will continue Sertraline 200 MG daily + continue Vistaril as needed (change to Buspar in future).  This benefits both anxiety and depression elements. Return to office in 6 months.      Relevant Medications   traZODone  (DESYREL) 50 MG tablet     Follow up plan: Return in about 6 weeks (around 08/25/2021) for Sinus issues.

## 2021-07-14 NOTE — Assessment & Plan Note (Signed)
**Note De-Identified Guerrini Obfuscation** Noted on recent labs, ASCVD 8.5%.  At this time continue to focus on diet changes and regular activity.  Lipid panel today and continue to recommend initiation of statin.

## 2021-07-14 NOTE — Patient Instructions (Signed)
**Note De-identified [place] Obfuscation** Sinusitis, Adult °Sinusitis is soreness and swelling (inflammation) of your sinuses. Sinuses are hollow spaces in the bones around your face. They are located: °Around your eyes. °In the middle of your forehead. °Behind your nose. °In your cheekbones. °Your sinuses and nasal passages are lined with a fluid called mucus. Mucus drains out of your sinuses. Swelling can trap mucus in your sinuses. This lets germs (bacteria, virus, or fungus) grow, which leads to infection. Most of the time, this condition is caused by a virus. °What are the causes? °This condition is caused by: °Allergies. °Asthma. °Germs. °Things that block your nose or sinuses. °Growths in the nose (nasal polyps). °Chemicals or irritants in the air. °Fungus (rare). °What increases the risk? °You are more likely to develop this condition if: °You have a weak body defense system (immune system). °You do a lot of swimming or diving. °You use nasal sprays too much. °You smoke. °What are the signs or symptoms? °The main symptoms of this condition are pain and a feeling of pressure around the sinuses. Other symptoms include: °Stuffy nose (congestion). °Runny nose (drainage). °Swelling and warmth in the sinuses. °Headache. °Toothache. °A cough that may get worse at night. °Mucus that collects in the throat or the back of the nose (postnasal drip). °Being unable to smell and taste. °Being very tired (fatigue). °A fever. °Sore throat. °Bad breath. °How is this diagnosed? °This condition is diagnosed based on: °Your symptoms. °Your medical history. °A physical exam. °Tests to find out if your condition is short-term (acute) or long-term (chronic). Your doctor may: °Check your nose for growths (polyps). °Check your sinuses using a tool that has a light (endoscope). °Check for allergies or germs. °Do imaging tests, such as an MRI or CT scan. °How is this treated? °Treatment for this condition depends on the cause and whether it is short-term or long-term. °If  caused by a virus, your symptoms should go away on their own within 10 days. You may be given medicines to relieve symptoms. They include: °Medicines that shrink swollen tissue in the nose. °Medicines that treat allergies (antihistamines). °A spray that treats swelling of the nostrils.  °Rinses that help get rid of thick mucus in your nose (nasal saline washes). °If caused by bacteria, your doctor may wait to see if you will get better without treatment. You may be given antibiotic medicine if you have: °A very bad infection. °A weak body defense system. °If caused by growths in the nose, you may need to have surgery. °Follow these instructions at home: °Medicines °Take, use, or apply over-the-counter and prescription medicines only as told by your doctor. These may include nasal sprays. °If you were prescribed an antibiotic medicine, take it as told by your doctor. Do not stop taking the antibiotic even if you start to feel better. °Hydrate and humidify ° °Drink enough water to keep your pee (urine) pale yellow. °Use a cool mist humidifier to keep the humidity level in your home above 50%. °Breathe in steam for 10-15 minutes, 3-4 times a day, or as told by your doctor. You can do this in the bathroom while a hot shower is running. °Try not to spend time in cool or dry air. °Rest °Rest as much as you can. °Sleep with your head raised (elevated). °Make sure you get enough sleep each night. °General instructions ° °Put a warm, moist washcloth on your face 3-4 times a day, or as often as told by your doctor. This will help with discomfort. ° **Note De-identified He[place]ley Obfuscation** Wash your hands often with soap and water. If there is no soap and water, use hand sanitizer. °Do not smoke. Avoid being around people who are smoking (secondhand smoke). °Keep all follow-up visits as told by your doctor. This is important. °Contact a doctor if: °You have a fever. °Your symptoms get worse. °Your symptoms do not get better within 10 days. °Get help right away  if: °You have a very bad headache. °You cannot stop throwing up (vomiting). °You have very bad pain or swelling around your face or eyes. °You have trouble seeing. °You feel confused. °Your neck is stiff. °You have trouble breathing. °Summary °Sinusitis is swelling of your sinuses. Sinuses are hollow spaces in the bones around your face. °This condition is caused by tissues in your nose that become inflamed or swollen. This traps germs. These can lead to infection. °If you were prescribed an antibiotic medicine, take it as told by your doctor. Do not stop taking it even if you start to feel better. °Keep all follow-up visits as told by your doctor. This is important. °This information is not intended to replace advice given to you by your health care provider. Make sure you discuss any questions you have with your health care provider. °Document Revised: 12/20/2017 Document Reviewed: 12/20/2017 °Elsevier Patient Education © 2022 Elsevier Inc. ° °

## 2021-07-14 NOTE — Assessment & Plan Note (Signed)
**Note De-Identified [place] Obfuscation** I have recommended complete cessation of tobacco use. I have discussed various options available for assistance with tobacco cessation including over the counter methods (Nicotine gum, patch and lozenges). We also discussed prescription options (Chantix, Nicotine Inhaler / Nasal Spray). The patient is not interested in pursuing any prescription tobacco cessation options at this time. Continue yearly lung CT screening -- reached out to team as is past due and has not heard from scheduling team.

## 2021-07-14 NOTE — Assessment & Plan Note (Signed)
**Note De-identified Griffee Obfuscation** Chronic, ongoing.  Denies SI/HI.  Will continue Sertraline 200 MG daily + continue Vistaril as needed (change to Buspar in future).  This benefits both anxiety and depression elements. Return to office in 6 months. 

## 2021-07-15 LAB — CBC WITH DIFFERENTIAL/PLATELET
Basophils Absolute: 0.1 10*3/uL (ref 0.0–0.2)
Basos: 1 %
EOS (ABSOLUTE): 0.1 10*3/uL (ref 0.0–0.4)
Eos: 1 %
Hematocrit: 40.4 % (ref 34.0–46.6)
Hemoglobin: 13.7 g/dL (ref 11.1–15.9)
Immature Grans (Abs): 0 10*3/uL (ref 0.0–0.1)
Immature Granulocytes: 0 %
Lymphocytes Absolute: 2.2 10*3/uL (ref 0.7–3.1)
Lymphs: 17 %
MCH: 32.2 pg (ref 26.6–33.0)
MCHC: 33.9 g/dL (ref 31.5–35.7)
MCV: 95 fL (ref 79–97)
Monocytes Absolute: 0.5 10*3/uL (ref 0.1–0.9)
Monocytes: 4 %
Neutrophils Absolute: 9.9 10*3/uL — ABNORMAL HIGH (ref 1.4–7.0)
Neutrophils: 77 %
Platelets: 512 10*3/uL — ABNORMAL HIGH (ref 150–450)
RBC: 4.25 x10E6/uL (ref 3.77–5.28)
RDW: 12.7 % (ref 11.7–15.4)
WBC: 12.8 10*3/uL — ABNORMAL HIGH (ref 3.4–10.8)

## 2021-07-15 LAB — LIPID PANEL W/O CHOL/HDL RATIO
Cholesterol, Total: 182 mg/dL (ref 100–199)
HDL: 53 mg/dL (ref >39)
LDL Chol Calc (NIH): 94 mg/dL (ref 0–99)
Triglycerides: 204 mg/dL — ABNORMAL HIGH (ref 0–149)
VLDL Cholesterol Cal: 35 mg/dL (ref 5–40)

## 2021-07-15 LAB — BASIC METABOLIC PANEL
BUN/Creatinine Ratio: 12 (ref 12–28)
BUN: 8 mg/dL (ref 8–27)
CO2: 20 mmol/L (ref 20–29)
Calcium: 9.7 mg/dL (ref 8.7–10.3)
Chloride: 106 mmol/L (ref 96–106)
Creatinine, Ser: 0.65 mg/dL (ref 0.57–1.00)
Glucose: 89 mg/dL (ref 70–99)
Potassium: 4 mmol/L (ref 3.5–5.2)
Sodium: 141 mmol/L (ref 134–144)
eGFR: 100 mL/min/{1.73_m2} (ref >59)

## 2021-07-15 NOTE — Progress Notes (Signed)
**Note De-Identified Kowalewski Obfuscation** Contacted [facility] My[facility]   Good evening Klee, your labs have returned: - Kidney function, creatinine and eGFR, is normal.  Liver function, AST and ALT, also normal. - Cholesterol levels stable at this time, continue heavy focus on diet. - White blood cell count is elevated, most likely from infection.  Take antibiotic as ordered.  Platelet count slightly elevated, will recheck next visit. Keep being amazing!!  Thank you for allowing me to participate in your care.  I appreciate you. Kindest regards, Carles Florea

## 2021-07-16 ENCOUNTER — Telehealth: Payer: Self-pay | Admitting: Nurse Practitioner

## 2021-07-16 NOTE — Telephone Encounter (Signed)
**Note De-Identified [place]ide Obfuscation** Copied from Central [id number]. Topic: General - Other >> Jul 15, 2021  4:48 PM Yvette Rack wrote: Reason for CRM: Pt called to thank Jolene and inform her that she has an appt at [facility] tomorrow at 3 pm.

## 2021-07-16 NOTE — Telephone Encounter (Signed)
**Note De-identified [place] Obfuscation** Great news.

## 2021-07-20 ENCOUNTER — Other Ambulatory Visit: Payer: Self-pay | Admitting: Nurse Practitioner

## 2021-07-21 NOTE — Telephone Encounter (Signed)
**Note De-Identified Oyervides Obfuscation** Requested Prescriptions  Pending Prescriptions Disp Refills   hydrOXYzine (VISTARIL) 25 MG capsule [Pharmacy Med Name: hydrOXYzine Pamoate 25 MG Oral Capsule] 90 capsule 0    Sig: TAKE 1 CAPSULE BY MOUTH THREE TIMES DAILY AS NEEDED FOR ANXIETY     Ear, Nose, and Throat:  Antihistamines Passed - 07/20/2021  6:00 PM      Passed - Valid encounter within last 12 months    Recent Outpatient Visits          1 week ago Depression, major, single episode, moderate ([place])   [place], Sullivan T, NP   2 months ago Sinus headache   [place], Lauren A, NP   3 months ago Sinus headache   [place], Henrine Screws T, NP   6 months ago Centrilobular emphysema ([place])   [place], Jolene T, NP   9 months ago Left lower quadrant abdominal pain   [place], Barbaraann Faster, NP      Future Appointments            In 1 month Cannady, Barbaraann Faster, NP [facility]

## 2021-07-21 NOTE — Telephone Encounter (Signed)
**Note De-Identified Cutler Obfuscation** Requested Prescriptions  Pending Prescriptions Disp Refills   hydrOXYzine (VISTARIL) 25 MG capsule [Pharmacy Med Name: hydrOXYzine Pamoate 25 MG Oral Capsule] 90 capsule 0    Sig: TAKE 1 CAPSULE BY MOUTH THREE TIMES DAILY AS NEEDED FOR ANXIETY     Ear, Nose, and Throat:  Antihistamines Passed - 07/20/2021  6:00 PM      Passed - Valid encounter within last 12 months    Recent Outpatient Visits          1 week ago Depression, major, single episode, moderate ([place])   [place], [place] T, NP   2 months ago Sinus headache   [place], Lauren A, NP   3 months ago Sinus headache   [place], Henrine Screws T, NP   6 months ago Centrilobular emphysema ([place])   [place] Cannady, Jolene T, NP   9 months ago Left lower quadrant abdominal pain   [place], Barbaraann Faster, NP      Future Appointments            In 1 month Cannady, Barbaraann Faster, NP [facility]

## 2021-07-22 ENCOUNTER — Telehealth: Payer: Self-pay

## 2021-07-22 NOTE — Chronic Care Management (AMB) (Signed)
**Note De-identified Damiani Obfuscation** ° **Note De-Identified Berrey Obfuscation** Care Management   Note  07/22/2021 Name: Haley [name] MRN: [id number] DOB: 11-15-1960  [name] is a 60 y.o. year old female who is a primary care patient of Venita Lick, NP and is actively engaged with the care management team. I reached out to [name] by phone today to assist with re-scheduling a follow up visit with the Licensed Clinical Social Worker  Follow up plan: Unsuccessful telephone outreach attempt made. A HIPAA compliant phone message was left for the patient providing contact information and requesting a return call.  The care management team will reach out to the patient again over the next 7 days.  If patient returns call to provider office, please advise to call [name]  at [name], [name], [name], [name] [address] Direct Dial: [phone] [email or web address]: [email or web address]

## 2021-07-23 ENCOUNTER — Telehealth: Payer: 59

## 2021-07-23 ENCOUNTER — Telehealth: Payer: Self-pay

## 2021-07-23 NOTE — Telephone Encounter (Signed)
**Note De-identified Ivanoff Obfuscation** ° **Note De-Identified [place] Obfuscation** Care Management   Follow Up Note   07/23/2021 Name: Haley [name] MRN: [id number] DOB: April 17, 1961   Referred by: Venita Lick, NP Reason for referral : Care Coordination (RNCM: Follow up for Chronic Disease Management and Care Coordination Needs )   An unsuccessful telephone outreach was attempted today. The patient was referred to the case management team for assistance with care management and care coordination. The patient answered but was unable to talk right now. Ask for a call back at another time.   Follow Up Plan: The care management team will reach out to the patient again over the next 30 days.   Noreene Larsson RN, MSN, [facility] Mobile: [phone]

## 2021-07-29 NOTE — Chronic Care Management (AMB) (Signed)
**Note De-identified Ding Obfuscation** ° **Note De-Identified Omdahl Obfuscation** Care Management   Note  07/29/2021 Name: Haley [name] MRN: [id number] DOB: August 15, 1960  Haley [name] is a 60 y.o. year old female who is a primary care patient of Venita Lick, NP and is actively engaged with the care management team. I reached out to [place] by phone today to assist with re-scheduling a follow up visit with the Licensed Clinical Social Worker  Follow up plan: Unsuccessful telephone outreach attempt made. A HIPAA compliant phone message was left for the patient providing contact information and requesting a return call.  The care management team will reach out to the patient again over the next 7 days.  If patient returns call to provider office, please advise to call [place]  at [place], [place], [place], [place] [address] Direct Dial: [phone] [email or web address]: [email or web address]

## 2021-08-07 NOTE — Chronic Care Management (AMB) (Signed)
**Note De-identified Crisostomo Obfuscation** ° **Note De-Identified Bartok Obfuscation** Care Management   Note  08/07/2021 Name: [name] MRN: [id number] DOB: Dec 10, 1960  Haley [name] is a 61 y.o. year old female who is a primary care patient of Venita Lick, NP and is actively engaged with the care management team. I reached out to [place] by phone today to assist with re-scheduling a follow up visit with the Licensed Clinical Social Worker  Follow up plan: Telephone appointment with care management team member scheduled for:08/21/2021  Noreene Larsson, [place], [place], [place] [address] Direct Dial: [phone] [email or web address] Website: [email or web address]

## 2021-08-15 ENCOUNTER — Telehealth: Payer: Self-pay

## 2021-08-15 NOTE — Chronic Care Management (AMB) (Signed)
**Note De-identified Reposa Obfuscation** ° **Note De-Identified Salmons Obfuscation** Care Management   Note  08/15/2021 Name: Haley [name] MRN: [id number] DOB: October 08, 1960  Haley [name] is a 61 y.o. year old female who is a primary care patient of Venita Lick, NP and is actively engaged with the care management team. I reached out to [place] by phone today to assist with re-scheduling a follow up visit with the Licensed Clinical Social Worker  Follow up plan: Unsuccessful telephone outreach attempt made. A HIPAA compliant phone message was left for the patient providing contact information and requesting a return call.  The care management team will reach out to the patient again over the next 7 days.  If patient returns call to provider office, please advise to call [name]  at [place], [place], [place], [place] [address] Direct Dial: [phone] [email or web address] Website: [email or web address]

## 2021-08-21 ENCOUNTER — Telehealth: Payer: 59

## 2021-08-22 ENCOUNTER — Telehealth: Payer: Self-pay | Admitting: Acute Care

## 2021-08-22 NOTE — Telephone Encounter (Signed)
**Note De-Identified Crotwell Obfuscation** Unable to reach by phone to schedule annual LDCT. Voicemail full

## 2021-08-25 ENCOUNTER — Ambulatory Visit: Payer: Self-pay | Admitting: Nurse Practitioner

## 2021-08-27 NOTE — Chronic Care Management (AMB) (Signed)
**Note De-identified Langelier Obfuscation** ° **Note De-Identified Junio Obfuscation** Care Management   Note  08/27/2021 Name: Haley [name] MRN: [id number] DOB: [birth year]  Haley [name] is a 61 y.o. year old female who is a primary care patient of Venita Lick, NP and is actively engaged with the care management team. I reached out to [place] by phone today to assist with re-scheduling a follow up visit with the Licensed Clinical Social Worker  Follow up plan: Unsuccessful telephone outreach attempt made. The care management team will reach out to the patient again over the next 7 days.  If patient returns call to provider office, please advise to call [place]  at [place], [place], [place]  [place], [place] [address] Direct Dial: [phone] [email or web address] Website: [email or web address]

## 2021-09-03 ENCOUNTER — Telehealth: Payer: Self-pay

## 2021-09-03 ENCOUNTER — Telehealth: Payer: 59

## 2021-09-03 NOTE — Telephone Encounter (Signed)
**Note De-identified Narayan Obfuscation** ° **Note De-Identified Sprigg Obfuscation** Care Management   Follow Up Note   09/03/2021 Name: Haley [name] MRN: [id number] DOB: Dec 05, 1960   Referred by: Venita Lick, NP Reason for referral : Care Coordination (RNCM: Follow up for Chronic Disease Management and Care Coordination Needs )   An unsuccessful telephone outreach was attempted today. The patient was referred to the case management team for assistance with care management and care coordination.   Follow Up Plan: The care management team will reach out to the patient again over the next 30 days.   Noreene Larsson RN, MSN, [facility] Mobile: [phone]

## 2021-09-11 NOTE — Chronic Care Management (AMB) (Signed)
**Note De-identified Epperly Obfuscation** ° **Note De-Identified Lagos Obfuscation** Care Management   Note  09/11/2021 Name: Haley [name] MRN: [id number] DOB: 09-30-1960  Haley [name] is a 61 y.o. year old female who is a primary care patient of Venita Lick, NP and is actively engaged with the care management team. I reached out to [place] by phone today to assist with scheduling a follow up visit with the Licensed Clinical Social Worker  Follow up plan: Unable to make contact on outreach attempts x 3. PCP Venita Lick, NP notified [place] routed documentation in medical record.   Noreene Larsson, [place], [place], [place] [address] Direct Dial: [phone] [email or web address] Website: [email or web address]

## 2021-10-05 NOTE — Patient Instructions (Signed)
**Note De-Identified Wecker Obfuscation** Diet for Irritable Bowel Syndrome ?When you have irritable bowel syndrome (IBS), it is very important to eat the foods and follow the eating habits that are best for your condition. IBS may cause various symptoms such as pain in the abdomen, constipation, or diarrhea. Choosing the right foods can help to ease the discomfort from these symptoms. Work with your health care provider and diet and nutrition specialist (dietitian) to find the eating plan that will help to control your symptoms. ?What are tips for following this plan? ?  ?Keep a food diary. This will help you identify foods that cause symptoms. Write down: ?What you eat and when you eat it. ?What symptoms you have. ?When symptoms occur in relation to your meals, such as "pain in abdomen 2 hours after dinner." ?Eat your meals slowly and in a relaxed setting. ?Aim to eat 5-6 small meals per day. Do not skip meals. ?Drink enough fluid to keep your urine pale yellow. ?Ask your health care provider if you should take an over-the-counter probiotic to help restore healthy bacteria in your gut (digestive tract). ?Probiotics are foods that contain good bacteria and yeasts. ?Your dietitian may have specific dietary recommendations for you based on your symptoms. He or she may recommend that you: ?Avoid foods that cause symptoms. Talk with your dietitian about other ways to get the same nutrients that are in those problem foods. ?Avoid foods with gluten. Gluten is a protein that is found in rye, wheat, and barley. ?Eat more foods that contain soluble fiber. Examples of foods with high soluble fiber include oats, seeds, and certain fruits and vegetables. Take a fiber supplement if directed by your dietitian. ?Reduce or avoid certain foods called FODMAPs. These are foods that contain carbohydrates that are hard to digest. Ask your doctor which foods contain these carbohydrates. ?What foods are not recommended? ?The following are some foods and drinks that may make your  symptoms worse: ?Fatty foods, such as french fries. ?Foods that contain gluten, such as pasta and cereal. ?Dairy products, such as milk, cheese, and ice cream. ?Chocolate. ?Alcohol. ?Products with caffeine, such as coffee. ?Carbonated drinks, such as soda. ?Foods that are high in FODMAPs. These include certain fruits and vegetables. ?Products with sweeteners such as honey, high fructose corn syrup, sorbitol, and mannitol. ?The items listed above may not be a complete list of foods and beverages you should avoid. Contact a dietitian for more information. ?What foods are good sources of fiber? ?Your health care provider or dietitian may recommend that you eat more foods that contain fiber. Fiber can help to reduce constipation and other IBS symptoms. Add foods with fiber to your diet a little at a time so your body can get used to them. Too much fiber at one time might cause gas and swelling of your abdomen. The following are some foods that are good sources of fiber: ?Berries, such as raspberries, strawberries, and blueberries. ?Tomatoes. ?Carrots. ?Brown rice. ?Oats. ?Seeds, such as chia and pumpkin seeds. ?The items listed above may not be a complete list of recommended sources of fiber. Contact your dietitian for more options. ?Where to find more information ?International Foundation for Functional Gastrointestinal Disorders: www.iffgd.org ?[employer] of Diabetes and Digestive and Kidney Diseases: [employer].dk ?Summary ?When you have irritable bowel syndrome (IBS), it is very important to eat the foods and follow the eating habits that are best for your condition. ?IBS may cause various symptoms such as pain in the abdomen, constipation, or diarrhea. ?Choosing the right **Note De-Identified Wyman Obfuscation** foods can help to ease the discomfort that comes from symptoms. ?Keep a food diary. This will help you identify foods that cause symptoms. ?Your health care provider or diet and nutrition specialist (dietitian) may recommend that you  eat more foods that contain fiber. ?This information is not intended to replace advice given to you by your health care provider. Make sure you discuss any questions you have with your health care provider. ?Document Revised: 03/14/2020 Document Reviewed: 03/21/2020 ?Elsevier Patient Education ? [place]. ? ?

## 2021-10-06 ENCOUNTER — Ambulatory Visit (INDEPENDENT_AMBULATORY_CARE_PROVIDER_SITE_OTHER): Payer: 59 | Admitting: Nurse Practitioner

## 2021-10-06 ENCOUNTER — Other Ambulatory Visit: Payer: Self-pay

## 2021-10-06 ENCOUNTER — Encounter: Payer: Self-pay | Admitting: Nurse Practitioner

## 2021-10-06 VITALS — BP 127/85 | HR 76 | Ht 60.4 in | Wt 99.4 lb

## 2021-10-06 DIAGNOSIS — J0101 Acute recurrent maxillary sinusitis: Secondary | ICD-10-CM | POA: Diagnosis not present

## 2021-10-06 DIAGNOSIS — R1032 Left lower quadrant pain: Secondary | ICD-10-CM

## 2021-10-06 MED ORDER — AMOXICILLIN-POT CLAVULANATE 875-125 MG PO TABS: 1.0000 | ORAL_TABLET | Freq: Two times a day (BID) | ORAL | 0 refills | Status: AC

## 2021-10-06 MED ORDER — TIZANIDINE HCL 4 MG PO TABS: 4.0000 mg | ORAL_TABLET | Freq: Four times a day (QID) | ORAL | 4 refills | Status: DC | PRN

## 2021-10-06 MED ORDER — PREDNISONE 20 MG PO TABS: 40.0000 mg | ORAL_TABLET | Freq: Every day | ORAL | 0 refills | Status: AC

## 2021-10-06 NOTE — Assessment & Plan Note (Signed)
**Note De-Identified Delarocha Obfuscation** Ongoing since removal of teeth and injury.  Collaborating with ENT at this time, continue this.  Concern for infection, will send in Prednisone taper and Augmentin. Recommend mouth rinses frequently at home.  CBC and CMP today. ?

## 2021-10-06 NOTE — Progress Notes (Signed)
**Note De-Identified Enslin Obfuscation** BP 127/85    Pulse 76    Ht 5' 0.4" (1.534 m)    Wt 99 lb 6.4 oz (45.1 kg)    SpO2 100%    BMI 19.16 kg/m    Subjective:    Patient ID: Haley [name], female    DOB: Jun 27, 1961, 61 y.o.   MRN: [id number]  HPI: Haley [name] is a 61 y.o. female  Chief Complaint  Patient presents with   Headache   Diverticulitis    Patient states she does not know what is happening but she is in a lot of pain and she has been operated on in the past for a flare up. Patient states it has been over a month since the pain started. Patient denies any thing helping with the pain or discomfort.    HEADACHES Having issues with this since having teeth pulled + saw ENT and has a hole in sinuses that may need surgery -- Dr. Jeannie Fend. Returns to see him this month.  Is having pressure in sinus area at this time and gum pain.  All over head with headache and shoulder tension.  Does endorse increased stressors recently.   Duration: months Onset: gradual Severity: 6/10 Quality: dull and aching Frequency: intermittent Location:  Headache duration: Radiation: no Time of day headache occurs:  varies Alleviating factors: APAP + muscle relaxer Aggravating factors: unknown Headache status at time of visit: asymptomatic Treatments attempted: APAP + muscle relaxer Aura: no Nausea:  no Vomiting: no Photophobia:  no Phonophobia:  no Effect on social functioning:  no Numbers of missed days of school/work each month: none Confusion:  no Gait disturbance/ataxia:  no Behavioral changes:  no Fevers:  no   BOWEL ISSUES Symptoms off and on for 6 weeks.  Reports issues similar to when last presented for diverticulitis.  To LLQ, feels like someone is pulling there.  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  Has significant history of diverticulitis and abscess issues -- last treated 09/17/20.  Has history of surgery for this in February  2019. Then in March 2019 had laparoscopic surgery due to abscess. Had colonoscopy with Dr. Bary Castilla in 2017 noting diverticulosis of sigmoid colon.  Denies dietary indiscretions.   Duration: waxing and waning Onset: gradual Severity: 3-4/10  Quality: dull, aching, tearing and throbbing Location:  LLQ  Episode duration: fluctuates between diarrhea Radiation: no Frequency: intermittent Alleviating factors:  Aggravating factors: Status: stable Treatments attempted: Miralax Fever: no Nausea: none Vomiting: no Weight loss: no Decreased appetite: no Diarrhea: fluctuating with constipation Constipation: no Blood in stool: no Heartburn: no Jaundice: no Rash: no Dysuria/urinary frequency: no Hematuria: no History of sexually transmitted disease: no Recurrent NSAID use: no  Relevant past medical, surgical, family and social history reviewed and updated as indicated. Interim medical history since our last visit reviewed. Allergies and medications reviewed and updated.  Review of Systems  Constitutional:  Negative for activity change, appetite change, diaphoresis, fatigue and fever.  Respiratory:  Negative for cough, chest tightness and shortness of breath.   Cardiovascular:  Negative for chest pain, palpitations and leg swelling.  Gastrointestinal:  Positive for abdominal pain, constipation, diarrhea and nausea. Negative for abdominal distention and vomiting.  Neurological: Negative.   Psychiatric/Behavioral:  Positive for decreased concentration and sleep disturbance. Negative for self-injury and suicidal ideas. The patient is nervous/anxious.    Per HPI unless specifically indicated above     Objective: **Note De-Identified Fass Obfuscation** BP 127/85    Pulse 76    Ht 5' 0.4" (1.534 m)    Wt 99 lb 6.4 oz (45.1 kg)    SpO2 100%    BMI 19.16 kg/m   Wt Readings from Last 3 Encounters:  10/06/21 99 lb 6.4 oz (45.1 kg)  07/14/21 99 lb 12.8 oz (45.3 kg)  05/20/21 95 lb 12.8 oz (43.5 kg)    Physical Exam Vitals and  nursing note reviewed.  Constitutional:      General: She is awake. She is not in acute distress.    Appearance: She is well-developed and well-groomed. She is not ill-appearing.  HENT:     Head: Normocephalic.     Right Ear: Hearing, tympanic membrane, ear canal and external ear normal.     Left Ear: Hearing, tympanic membrane, ear canal and external ear normal.     Nose: No congestion or rhinorrhea.     Right Sinus: Maxillary sinus tenderness present. No frontal sinus tenderness.     Left Sinus: Maxillary sinus tenderness present. No frontal sinus tenderness.     Mouth/Throat:     Mouth: Mucous membranes are moist.     Pharynx: Oropharynx is clear.  Eyes:     General: Lids are normal.        Right eye: No discharge.        Left eye: No discharge.     Conjunctiva/sclera: Conjunctivae normal.     Pupils: Pupils are equal, round, and reactive to light.  Neck:     Thyroid: No thyromegaly.     Vascular: No carotid bruit.  Cardiovascular:     Rate and Rhythm: Normal rate and regular rhythm.     Heart sounds: Normal heart sounds. No murmur heard.   No gallop.  Pulmonary:     Effort: Pulmonary effort is normal. No accessory muscle usage or respiratory distress.     Breath sounds: Normal breath sounds.  Abdominal:     General: Bowel sounds are normal. There is no distension or abdominal bruit.     Palpations: Abdomen is soft. There is no hepatomegaly.     Tenderness: There is abdominal tenderness in the left lower quadrant. There is no right CVA tenderness, left CVA tenderness, guarding or rebound. Negative signs include Murphy's sign.     Hernia: No hernia is present.  Musculoskeletal:     Cervical back: Normal range of motion and neck supple.     Right lower leg: No edema.     Left lower leg: No edema.  Skin:    General: Skin is warm and dry.  Neurological:     Mental Status: She is alert and oriented to person, place, and time.     Cranial Nerves: Cranial nerves 2-12 are  intact.     Motor: Motor function is intact.     Coordination: Coordination is intact.     Gait: Gait is intact.     Deep Tendon Reflexes: Reflexes are normal and symmetric.     Reflex Scores:      Brachioradialis reflexes are 2+ on the right side and 2+ on the left side.      Patellar reflexes are 2+ on the right side and 2+ on the left side. Psychiatric:        Attention and Perception: Attention normal.        Mood and Affect: Mood normal.        Behavior: Behavior normal. Behavior is cooperative. **Note De-Identified Jeanty Obfuscation** Thought Content: Thought content normal.        Judgment: Judgment normal.    Results for orders placed or performed in visit on 16/10/96  Basic metabolic panel  Result Value Ref Range   Glucose 89 70 - 99 mg/dL   BUN 8 8 - 27 mg/dL   Creatinine, Ser 0.65 0.57 - 1.00 mg/dL   eGFR 100 >59 mL/min/1.73   BUN/Creatinine Ratio 12 12 - 28   Sodium 141 134 - 144 mmol/L   Potassium 4.0 3.5 - 5.2 mmol/L   Chloride 106 96 - 106 mmol/L   CO2 20 20 - 29 mmol/L   Calcium 9.7 8.7 - 10.3 mg/dL  Lipid Panel w/o Chol/HDL Ratio  Result Value Ref Range   Cholesterol, Total 182 100 - 199 mg/dL   Triglycerides 204 (H) 0 - 149 mg/dL   HDL 53 >39 mg/dL   VLDL Cholesterol Cal 35 5 - 40 mg/dL   LDL Chol Calc (NIH) 94 0 - 99 mg/dL  CBC with Differential/Platelet  Result Value Ref Range   WBC 12.8 (H) 3.4 - 10.8 x10E3/uL   RBC 4.25 3.77 - 5.28 x10E6/uL   Hemoglobin 13.7 11.1 - 15.9 g/dL   Hematocrit 40.4 34.0 - 46.6 %   MCV 95 79 - 97 fL   MCH 32.2 26.6 - 33.0 pg   MCHC 33.9 31.5 - 35.7 g/dL   RDW 12.7 11.7 - 15.4 %   Platelets 512 (H) 150 - 450 x10E3/uL   Neutrophils 77 Not Estab. %   Lymphs 17 Not Estab. %   Monocytes 4 Not Estab. %   Eos 1 Not Estab. %   Basos 1 Not Estab. %   Neutrophils Absolute 9.9 (H) 1.4 - 7.0 x10E3/uL   Lymphocytes Absolute 2.2 0.7 - 3.1 x10E3/uL   Monocytes Absolute 0.5 0.1 - 0.9 x10E3/uL   EOS (ABSOLUTE) 0.1 0.0 - 0.4 x10E3/uL   Basophils Absolute 0.1 0.0 -  0.2 x10E3/uL   Immature Granulocytes 0 Not Estab. %   Immature Grans (Abs) 0.0 0.0 - 0.1 x10E3/uL      Assessment & Plan:   Problem List Items Addressed This Visit       Respiratory   Acute recurrent maxillary sinusitis    Ongoing since removal of teeth and injury.  Collaborating with ENT at this time, continue this.  Concern for infection, will send in Prednisone taper and Augmentin. Recommend mouth rinses frequently at home.  CBC and CMP today.      Relevant Medications   amoxicillin-clavulanate (AUGMENTIN) 875-125 MG tablet   predniSONE (DELTASONE) 20 MG tablet     Other   Left lower quadrant abdominal pain - Primary    Acute with waxing and waning.  Is tender to LLQ and has significant history of diverticulitis and abscess issues.  Obtain CBC, TSH, and CMP.  Has poor diet adherence, recommend heavy focus on reducing foods that cause flares.  Due to past history obtain CT scan.  Return in 14 days for follow-up, sooner if worsening issues.  Recommend she follow-up with Dr. Bary Castilla as well.      Relevant Orders   CT Abdomen Pelvis Wo Contrast   CBC with Differential/Platelet   Comprehensive metabolic panel   TSH     Follow up plan: Return in about 2 weeks (around 10/20/2021) for Abdominal pain and sinus issues.

## 2021-10-06 NOTE — Assessment & Plan Note (Signed)
**Note De-Identified Hollinshead Obfuscation** Acute with waxing and waning.  Is tender to LLQ and has significant history of diverticulitis and abscess issues.  Obtain CBC, TSH, and CMP.  Has poor diet adherence, recommend heavy focus on reducing foods that cause flares.  Due to past history obtain CT scan.  Return in 14 days for follow-up, sooner if worsening issues.  Recommend she follow-up with Dr. Bary Castilla as well. ?

## 2021-10-07 ENCOUNTER — Telehealth: Payer: Self-pay | Admitting: Nurse Practitioner

## 2021-10-07 LAB — CBC WITH DIFFERENTIAL/PLATELET
Basophils Absolute: 0.1 10*3/uL (ref 0.0–0.2)
Basos: 1 %
EOS (ABSOLUTE): 0.1 10*3/uL (ref 0.0–0.4)
Eos: 2 %
Hematocrit: 44.4 % (ref 34.0–46.6)
Hemoglobin: 14.6 g/dL (ref 11.1–15.9)
Immature Grans (Abs): 0 10*3/uL (ref 0.0–0.1)
Immature Granulocytes: 0 %
Lymphocytes Absolute: 2.4 10*3/uL (ref 0.7–3.1)
Lymphs: 33 %
MCH: 30.8 pg (ref 26.6–33.0)
MCHC: 32.9 g/dL (ref 31.5–35.7)
MCV: 94 fL (ref 79–97)
Monocytes Absolute: 0.5 10*3/uL (ref 0.1–0.9)
Monocytes: 7 %
Neutrophils Absolute: 4 10*3/uL (ref 1.4–7.0)
Neutrophils: 57 %
Platelets: 389 10*3/uL (ref 150–450)
RBC: 4.74 x10E6/uL (ref 3.77–5.28)
RDW: 13.6 % (ref 11.7–15.4)
WBC: 7 10*3/uL (ref 3.4–10.8)

## 2021-10-07 LAB — COMPREHENSIVE METABOLIC PANEL
ALT: 16 IU/L (ref 0–32)
AST: 19 IU/L (ref 0–40)
Albumin/Globulin Ratio: 1.7 (ref 1.2–2.2)
Albumin: 4.7 g/dL (ref 3.8–4.8)
Alkaline Phosphatase: 132 IU/L — ABNORMAL HIGH (ref 44–121)
BUN/Creatinine Ratio: 11 — ABNORMAL LOW (ref 12–28)
BUN: 9 mg/dL (ref 8–27)
Bilirubin Total: 0.4 mg/dL (ref 0.0–1.2)
CO2: 22 mmol/L (ref 20–29)
Calcium: 9.9 mg/dL (ref 8.7–10.3)
Chloride: 105 mmol/L (ref 96–106)
Creatinine, Ser: 0.79 mg/dL (ref 0.57–1.00)
Globulin, Total: 2.8 g/dL (ref 1.5–4.5)
Glucose: 97 mg/dL (ref 70–99)
Potassium: 4.3 mmol/L (ref 3.5–5.2)
Sodium: 142 mmol/L (ref 134–144)
Total Protein: 7.5 g/dL (ref 6.0–8.5)
eGFR: 85 mL/min/{1.73_m2} (ref >59)

## 2021-10-07 LAB — TSH: TSH: 2.32 u[IU]/mL (ref 0.450–4.500)

## 2021-10-07 NOTE — Telephone Encounter (Signed)
**Note De-Identified Lupien Obfuscation** Copied from [place] Negron Torres [phone]. Topic: General - Call Back - No Documentation ?>> Oct 07, 2021 11:57 AM Scherrie Gerlach wrote: ?Reason for CRM: pt cannot open her mychart at this time. Please call her w/ lab results, as Henrine Screws has already commented on ?

## 2021-10-07 NOTE — Telephone Encounter (Signed)
**Note De-Identified Minervini Obfuscation** Spoke with patient and notified her of recent lab results. Patient was made aware of Jolene's recommendations and verbalized understanding.  ?

## 2021-10-07 NOTE — Progress Notes (Signed)
**Note De-Identified Karstens Obfuscation** Contacted [facility] [place] ? ? ?Good morning Haley [name], your labs have returned and are overall stable.  CBC shows no elevations concerning for major infection, which is good.  Maintain follow-up with ENT and schedule CT scan when they call.  Any questions? ?Keep being amazing!!  Thank you for allowing me to participate in your care.  I appreciate you. ?Kindest regards, ?Yann Biehn ?

## 2021-10-17 ENCOUNTER — Telehealth: Payer: Self-pay | Admitting: Nurse Practitioner

## 2021-10-17 NOTE — Telephone Encounter (Signed)
**Note De-Identified Scorsone Obfuscation** Copied from [place] [phone]. Topic: General - Other ?>> Oct 17, 2021 11:11 AM Pawlus, Brayton Layman A wrote: ?Reason for CRM: Caller from patient estimates was calling due to an issue with a PA for the pts upcoming CT scan, please advise,  [phone]. ?

## 2021-10-19 NOTE — Patient Instructions (Signed)
**Note De-identified [place] Obfuscation** Managing Anxiety, Adult ?After being diagnosed with anxiety, you may be relieved to know why you have felt or behaved a certain way. You may also feel overwhelmed about the treatment ahead and what it will mean for your life. With care and support, you can manage this condition. ?How to manage lifestyle changes ?Managing stress and anxiety ?Stress is your body's reaction to life changes and events, both good and bad. Most stress will last just a few hours, but stress can be ongoing and can lead to more than just stress. Although stress can play a major role in anxiety, it is not the same as anxiety. Stress is usually caused by something external, such as a deadline, test, or competition. Stress normally passes after the triggering event has ended.  ?Anxiety is caused by something internal, such as imagining a terrible outcome or worrying that something will go wrong that will devastate you. Anxiety often does not go away even after the triggering event is over, and it can become long-term (chronic) worry. It is important to understand the differences between stress and anxiety and to manage your stress effectively so that it does not lead to an anxious response. ?Talk with your health care provider or a counselor to learn more about reducing anxiety and stress. He or she may suggest tension reduction techniques, such as: ?Music therapy. Spend time creating or listening to music that you enjoy and that inspires you. ?Mindfulness-based meditation. Practice being aware of your normal breaths while not trying to control your breathing. It can be done while sitting or walking. ?Centering prayer. This involves focusing on a word, phrase, or sacred image that means something to you and brings you peace. ?Deep breathing. To do this, expand your stomach and inhale slowly through your nose. Hold your breath for 3-5 seconds. Then exhale slowly, letting your stomach muscles relax. ?Self-talk. Learn to notice and identify  thought patterns that lead to anxiety reactions and change those patterns to thoughts that feel peaceful. ?Muscle relaxation. Taking time to tense muscles and then relax them. ?Choose a tension reduction technique that fits your lifestyle and personality. These techniques take time and practice. Set aside 5-15 minutes a day to do them. Therapists can offer counseling and training in these techniques. The training to help with anxiety may be covered by some insurance plans. ?Other things you can do to manage stress and anxiety include: ?Keeping a stress diary. This can help you learn what triggers your reaction and then learn ways to manage your response. ?Thinking about how you react to certain situations. You may not be able to control everything, but you can control your response. ?Making time for activities that help you relax and not feeling guilty about spending your time in this way. ?Doing visual imagery. This involves imagining or creating mental pictures to help you relax. ?Practicing yoga. Through yoga poses, you can lower tension and promote relaxation. ? ?Medicines ?Medicines can help ease symptoms. Medicines for anxiety include: ?Antidepressant medicines. These are usually prescribed for long-term daily control. ?Anti-anxiety medicines. These may be added in severe cases, especially when panic attacks occur. ?Medicines will be prescribed by a health care provider. When used together, medicines, psychotherapy, and tension reduction techniques may be the most effective treatment. ?Relationships ?Relationships can play a big part in helping you recover. Try to spend more time connecting with trusted friends and family members. ?Consider going to couples counseling if you have a partner, taking family education classes, or going to family  **Note De-identified [place] Obfuscation** therapy. ?Therapy can help you and others better understand your condition. ?How to recognize changes in your anxiety ?Everyone responds differently to treatment for  anxiety. Recovery from anxiety happens when symptoms decrease and stop interfering with your daily activities at home or work. This may mean that you will start to: ?Have better concentration and focus. Worry will interfere less in your daily thinking. ?Sleep better. ?Be less irritable. ?Have more energy. ?Have improved memory. ?It is also important to recognize when your condition is getting worse. Contact your health care provider if your symptoms interfere with home or work and you feel like your condition is not improving. ?Follow these instructions at home: ?Activity ?Exercise. Adults should do the following: ?Exercise for at least 150 minutes each week. The exercise should increase your heart rate and make you sweat (moderate-intensity exercise). ?Strengthening exercises at least twice a week. ?Get the right amount and quality of sleep. Most adults need 7-9 hours of sleep each night. ?Lifestyle ? ?Eat a healthy diet that includes plenty of vegetables, fruits, whole grains, low-fat dairy products, and lean protein. ?Do not eat a lot of foods that are high in fats, added sugars, or salt (sodium). ?Make choices that simplify your life. ?Do not use any products that contain nicotine or tobacco. These products include cigarettes, chewing tobacco, and vaping devices, such as e-cigarettes. If you need help quitting, ask your health care provider. ?Avoid caffeine, alcohol, and certain over-the-counter cold medicines. These may make you feel worse. Ask your pharmacist which medicines to avoid. ?General instructions ?Take over-the-counter and prescription medicines only as told by your health care provider. ?Keep all follow-up visits. This is important. ?Where to find support ?You can get help and support from these sources: ?Self-help groups. ?Online and community organizations. ?A trusted spiritual leader. ?Couples counseling. ?Family education classes. ?Family therapy. ?Where to find more information ?You may find  that joining a support group helps you deal with your anxiety. The following sources can help you locate counselors or support groups near you: ?Mental Health America: www.mentalhealthamerica.net ?Anxiety and Depression Association of America (ADAA): www.adaa.org ?National Alliance on Mental Illness (NAMI): www.nami.org ?Contact a health care provider if: ?You have a hard time staying focused or finishing daily tasks. ?You spend many hours a day feeling worried about everyday life. ?You become exhausted by worry. ?You start to have headaches or frequently feel tense. ?You develop chronic nausea or diarrhea. ?Get help right away if: ?You have a racing heart and shortness of breath. ?You have thoughts of hurting yourself or others. ?If you ever feel like you may hurt yourself or others, or have thoughts about taking your own life, get help right away. Go to your nearest emergency department or: ?Call your local emergency services (911 in the [place] ?Call a suicide crisis helpline, such as the National Suicide Prevention Lifeline at 1-800-273-8255 or 988 in the [place] This is open 24 hours a day in the [place] ?Text the Crisis Text Line at 741741 (in the [place] ?Summary ?Taking steps to learn and use tension reduction techniques can help calm you and help prevent triggering an anxiety reaction. ?When used together, medicines, psychotherapy, and tension reduction techniques may be the most effective treatment. ?Family, friends, and partners can play a big part in supporting you. ?This information is not intended to replace advice given to you by your health care provider. Make sure you discuss any questions you have with your health care provider. ?Document Revised: 02/12/2021 Document Reviewed: 11/10/2020 ?Elsevier Patient  **Note De-identified [place] Obfuscation** Education ? 2022 Elsevier Inc. ? ?

## 2021-10-21 ENCOUNTER — Ambulatory Visit: Payer: 59

## 2021-10-24 ENCOUNTER — Ambulatory Visit (INDEPENDENT_AMBULATORY_CARE_PROVIDER_SITE_OTHER): Payer: 59 | Admitting: Nurse Practitioner

## 2021-10-24 ENCOUNTER — Encounter: Payer: Self-pay | Admitting: Nurse Practitioner

## 2021-10-24 ENCOUNTER — Other Ambulatory Visit: Payer: Self-pay

## 2021-10-24 VITALS — BP 138/82 | HR 99 | Temp 98.6°F | Ht 60.39 in | Wt 102.2 lb

## 2021-10-24 DIAGNOSIS — R1032 Left lower quadrant pain: Secondary | ICD-10-CM | POA: Diagnosis not present

## 2021-10-24 DIAGNOSIS — J0101 Acute recurrent maxillary sinusitis: Secondary | ICD-10-CM

## 2021-10-24 MED ORDER — FEXOFENADINE HCL 180 MG PO TABS
180.0000 mg | ORAL_TABLET | Freq: Every day | ORAL | 4 refills | Status: DC
Start: 2021-10-24 — End: 2024-03-07

## 2021-10-24 MED ORDER — TIZANIDINE HCL 4 MG PO TABS: 4.0000 mg | ORAL_TABLET | Freq: Four times a day (QID) | ORAL | 4 refills | Status: DC | PRN

## 2021-10-24 NOTE — Progress Notes (Signed)
**Note De-Identified [place] Obfuscation** ? ?BP 138/82 (BP Location: Left Arm)   Pulse 99   Temp 98.6 ?F (37 ?C) (Oral)   Ht 5' 0.39" (1.534 m)   Wt 102 lb 3.2 oz (46.4 kg)   SpO2 98%   BMI 19.70 kg/m?   ? ?Subjective:  ? ? Patient ID: Haley [name], female    DOB: 1960-12-25, 61 y.o.   MRN: [id number] ? ?HPI: ?Haley [name] is a 61 y.o. female ? ?Chief Complaint  ?Patient presents with  ? Abdominal Pain  ?  And sinus issues follow up  ? ?HEADACHES ?Follow-up today for sinus issues.  Ongoing issues with this since having teeth pulled + saw ENT and has a hole in sinus area that may need surgery -- Dr. Jeannie Fend. Returns to see them in upcoming week.   ? ?All over head with headache and shoulder tension -- is taking Tizanidine and Tylenol which helps with this.   ?Duration: months ?Onset: gradual ?Severity: 3-4/10 ?Quality: dull and aching ?Frequency: intermittent ?Location:  ?Headache duration: ?Radiation: no ?Time of day headache occurs:  varies ?Alleviating factors: APAP + muscle relaxer ?Aggravating factors: unknown ?Headache status at time of visit: asymptomatic ?Treatments attempted: APAP + muscle relaxer ?Aura: no ?Nausea:  no ?Vomiting: no ?Photophobia:  no ?Phonophobia:  no ?Effect on social functioning:  no ?Numbers of missed days of school/work each month: none ?Confusion:  no ?Gait disturbance/ataxia:  no ?Behavioral changes:  no ?Fevers:  no ?  ?BOWEL ISSUES ?Follow-up for ongoing pain, was treated with Augmentin without benefit.  A CT was ordered, but she reports this was not covered and did not obtain.  Symptoms off and on for >6 weeks.  To LLQ, feels like someone is pulling there.  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  Does take Metamucil at home as needed.  ? ?Has significant history of diverticulitis and abscess issues -- last treated 09/17/20.  Has history of surgery for this in February 2019. Then in March 2019 had laparoscopic surgery due to abscess.  Had colonoscopy with Dr. Bary Castilla in 2017 noting diverticulosis of sigmoid colon.  Denies dietary indiscretions.  She sees Dr. Bary Castilla, who performed previous surgeries and colonoscopies, next week (Thursday). ?Duration: waxing and waning ?Onset: gradual ?Severity: 3-4/10  ?Quality: dull, aching, tearing and throbbing ?Location:  LLQ  ?Episode duration: fluctuates between diarrhea ?Radiation: no ?Frequency: intermittent ?Alleviating factors:  ?Aggravating factors: ?Status: stable ?Treatments attempted: Metamucil ?Fever: no ?Nausea: none ?Vomiting: no ?Weight loss: no ?Decreased appetite: no ?Diarrhea: fluctuating with constipation ?Constipation: no ?Blood in stool: no ?Heartburn: no ?Jaundice: no ?Rash: no ?Dysuria/urinary frequency: no ?Hematuria: no ?History of sexually transmitted disease: no ?Recurrent NSAID use: no ? ?Relevant past medical, surgical, family and social history reviewed and updated as indicated. Interim medical history since our last visit reviewed. ?Allergies and medications reviewed and updated. ? ?Review of Systems  ?Constitutional:  Negative for activity change, appetite change, diaphoresis, fatigue and fever.  ?Respiratory:  Negative for cough, chest tightness and shortness of breath.   ?Cardiovascular:  Negative for chest pain, palpitations and leg swelling.  ?Gastrointestinal:  Positive for abdominal pain, constipation, diarrhea and nausea. Negative for abdominal distention and vomiting.  ?Neurological: Negative.   ?Psychiatric/Behavioral:  Negative for decreased concentration, self-injury, sleep disturbance and suicidal ideas. The patient is not nervous/anxious.   ? ?Per HPI unless specifically indicated above ? ?   ?Objective:  ?  ?BP 138/82 (BP **Note De-Identified [place] Obfuscation** Location: Left Arm)   Pulse 99   Temp 98.6 ?F (37 ?C) (Oral)   Ht 5' 0.39" (1.534 m)   Wt 102 lb 3.2 oz (46.4 kg)   SpO2 98%   BMI 19.70 kg/m?   ?Wt Readings from Last 3 Encounters:  ?10/24/21 102 lb 3.2 oz (46.4 kg)  ?10/06/21 99 lb 6.4 oz  (45.1 kg)  ?07/14/21 99 lb 12.8 oz (45.3 kg)  ?  ?Physical Exam ?Vitals and nursing note reviewed.  ?Constitutional:   ?   General: She is awake. She is not in acute distress. ?   Appearance: She is well-developed and well-groomed. She is not ill-appearing.  ?HENT:  ?   Head: Normocephalic.  ?   Right Ear: Hearing, tympanic membrane, ear canal and external ear normal.  ?   Left Ear: Hearing, tympanic membrane, ear canal and external ear normal.  ?   Nose: No congestion or rhinorrhea.  ?   Right Sinus: No maxillary sinus tenderness or frontal sinus tenderness.  ?   Left Sinus: No maxillary sinus tenderness or frontal sinus tenderness.  ?   Mouth/Throat:  ?   Mouth: Mucous membranes are moist.  ?   Pharynx: Oropharynx is clear.  ?Eyes:  ?   General: Lids are normal.     ?   Right eye: No discharge.     ?   Left eye: No discharge.  ?   Conjunctiva/sclera: Conjunctivae normal.  ?   Pupils: Pupils are equal, round, and reactive to light.  ?Neck:  ?   Thyroid: No thyromegaly.  ?   Vascular: No carotid bruit.  ?Cardiovascular:  ?   Rate and Rhythm: Normal rate and regular rhythm.  ?   Heart sounds: Normal heart sounds. No murmur heard. ?  No gallop.  ?Pulmonary:  ?   Effort: Pulmonary effort is normal. No accessory muscle usage or respiratory distress.  ?   Breath sounds: Normal breath sounds.  ?Abdominal:  ?   General: Bowel sounds are normal. There is no distension or abdominal bruit.  ?   Palpations: Abdomen is soft. There is no hepatomegaly.  ?   Tenderness: There is abdominal tenderness in the left lower quadrant. There is no right CVA tenderness, left CVA tenderness, guarding or rebound. Negative signs include Murphy's sign.  ?   Hernia: No hernia is present.  ?Musculoskeletal:  ?   Cervical back: Normal range of motion and neck supple.  ?   Right lower leg: No edema.  ?   Left lower leg: No edema.  ?Skin: ?   General: Skin is warm and dry.  ?Neurological:  ?   Mental Status: She is alert and oriented to person,  place, and time.  ?   Cranial Nerves: Cranial nerves 2-12 are intact.  ?   Motor: Motor function is intact.  ?   Coordination: Coordination is intact.  ?   Gait: Gait is intact.  ?   Deep Tendon Reflexes: Reflexes are normal and symmetric.  ?   Reflex Scores: ?     Brachioradialis reflexes are 2+ on the right side and 2+ on the left side. ?     Patellar reflexes are 2+ on the right side and 2+ on the left side. ?Psychiatric:     ?   Attention and Perception: Attention normal.     ?   Mood and Affect: Mood normal.     ?   Behavior: Behavior normal. Behavior is cooperative.     ? **Note De-Identified [place] Obfuscation** Thought Content: Thought content normal.     ?   Judgment: Judgment normal.  ? ? ?Results for orders placed or performed in visit on 10/06/21  ?CBC with Differential/Platelet  ?Result Value Ref Range  ? WBC 7.0 3.4 - 10.8 x10E3/uL  ? RBC 4.74 3.77 - 5.28 x10E6/uL  ? Hemoglobin 14.6 11.1 - 15.9 g/dL  ? Hematocrit 44.4 34.0 - 46.6 %  ? MCV 94 79 - 97 fL  ? MCH 30.8 26.6 - 33.0 pg  ? MCHC 32.9 31.5 - 35.7 g/dL  ? RDW 13.6 11.7 - 15.4 %  ? Platelets 389 150 - 450 x10E3/uL  ? Neutrophils 57 Not Estab. %  ? Lymphs 33 Not Estab. %  ? Monocytes 7 Not Estab. %  ? Eos 2 Not Estab. %  ? Basos 1 Not Estab. %  ? Neutrophils Absolute 4.0 1.4 - 7.0 x10E3/uL  ? Lymphocytes Absolute 2.4 0.7 - 3.1 x10E3/uL  ? Monocytes Absolute 0.5 0.1 - 0.9 x10E3/uL  ? EOS (ABSOLUTE) 0.1 0.0 - 0.4 x10E3/uL  ? Basophils Absolute 0.1 0.0 - 0.2 x10E3/uL  ? Immature Granulocytes 0 Not Estab. %  ? Immature Grans (Abs) 0.0 0.0 - 0.1 x10E3/uL  ?Comprehensive metabolic panel  ?Result Value Ref Range  ? Glucose 97 70 - 99 mg/dL  ? BUN 9 8 - 27 mg/dL  ? Creatinine, Ser 0.79 0.57 - 1.00 mg/dL  ? eGFR 85 >59 mL/min/1.73  ? BUN/Creatinine Ratio 11 (L) 12 - 28  ? Sodium 142 134 - 144 mmol/L  ? Potassium 4.3 3.5 - 5.2 mmol/L  ? Chloride 105 96 - 106 mmol/L  ? CO2 22 20 - 29 mmol/L  ? Calcium 9.9 8.7 - 10.3 mg/dL  ? Total Protein 7.5 6.0 - 8.5 g/dL  ? Albumin 4.7 3.8 - 4.8 g/dL  ?  Globulin, Total 2.8 1.5 - 4.5 g/dL  ? Albumin/Globulin Ratio 1.7 1.2 - 2.2  ? Bilirubin Total 0.4 0.0 - 1.2 mg/dL  ? Alkaline Phosphatase 132 (H) 44 - 121 IU/L  ? AST 19 0 - 40 IU/L  ? ALT 16 0 - 32 IU/L  ?TSH  ?Result Value

## 2021-10-24 NOTE — Assessment & Plan Note (Signed)
**Note De-Identified [place] Obfuscation** Acute with waxing and waning.  Is tender to LLQ and has significant history of diverticulitis and abscess issues.  Recent labs stable.  She is scheduled to see Dr. Terri Piedra this week.  Recommend heavy focus on reducing foods that cause flares.  Due to past history obtain CT scan.  Return in 8 weeks for follow-up, sooner if worsening issues.   ?

## 2021-10-24 NOTE — Assessment & Plan Note (Signed)
**Note De-Identified [place] Obfuscation** Improved at this time.  Continue collaboration with ENT. ?

## 2021-10-29 ENCOUNTER — Other Ambulatory Visit: Payer: Self-pay | Admitting: Nurse Practitioner

## 2021-10-30 NOTE — Telephone Encounter (Signed)
**Note De-Identified [place] Obfuscation** Requested medications are due for refill today.  yes ? ?Requested medications are on the active medications list.  Pro Air is not, the others are ? ?Last refill. Pro-air 01/15/2021, Flonase 05/18/2020, Zoloft 01/10/2021 ? ?Future visit scheduled.   yes ? ?Notes to clinic.  Pro air is not on med list - Another inhaler is. The other 2 medications are not delegated for refills. ? ? ? ?Requested Prescriptions  ?Pending Prescriptions Disp Refills  ? PROAIR HFA 108 (90 Base) MCG/ACT inhaler [Pharmacy Med Name: ProAir HFA 108 (90 Base) MCG/ACT Inhalation Aerosol Solution] 9 g 0  ?  Sig: INHALE 2 PUFFS INTO LUNGS EVERY 6 HOURS AS NEEDED FOR WHEEZING OR SHORTNESS OF BREATH  ?  ? Pulmonology:  Beta Agonists 2 Passed - 10/29/2021  5:33 AM  ?  ?  Passed - Last BP in normal range  ?  BP Readings from Last 1 Encounters:  ?10/24/21 138/82  ?  ?  ?  ?  Passed - Last Heart Rate in normal range  ?  Pulse Readings from Last 1 Encounters:  ?10/24/21 99  ?  ?  ?  ?  Passed - Valid encounter within last 12 months  ?  Recent Outpatient Visits   ? ?      ? 6 days ago Left lower quadrant abdominal pain  ? [facility] [place], [place] T, NP  ? 3 weeks ago Left lower quadrant abdominal pain  ? [place], [place] T, NP  ? 3 months ago Depression, major, single episode, moderate ([place])  ? [place], [place] T, NP  ? 5 months ago Sinus headache  ? [facility], Lauren A, NP  ? 6 months ago Sinus headache  ? [facility] [place], Henrine Screws T, NP  ? ?  ?  ?Future Appointments   ? ?        ? In 1 month [place], Barbaraann Faster, NP [employer]  ? ?  ? ?  ?  ?  ? fluticasone (FLONASE) 50 MCG/ACT nasal spray [Pharmacy Med Name: Fluticasone Propionate 50 MCG/ACT Nasal Suspension] 16 g 0  ?  Sig: Use 2 spray(s) in each nostril once daily  ?  ? Not Delegated - Ear, Nose, and Throat: Nasal Preparations - Corticosteroids Failed - 10/29/2021  5:33 AM  ?  ?  Failed -  This refill cannot be delegated  ?  ?  Passed - Valid encounter within last 12 months  ?  Recent Outpatient Visits   ? ?      ? 6 days ago Left lower quadrant abdominal pain  ? [facility] [place], [place] T, NP  ? 3 weeks ago Left lower quadrant abdominal pain  ? [place], [place] T, NP  ? 3 months ago Depression, major, single episode, moderate ([place])  ? [place], [place] T, NP  ? 5 months ago Sinus headache  ? [facility], Lauren A, NP  ? 6 months ago Sinus headache  ? [facility] [place], Henrine Screws T, NP  ? ?  ?  ?Future Appointments   ? ?        ? In 1 month Cannady, Barbaraann Faster, NP [employer] [facility]  ? ?  ? ?  ?  ?  ? sertraline (ZOLOFT) 100 MG tablet [Pharmacy Med Name: Sertraline HCl 100 MG Oral Tablet] 60 tablet 0  ?  Sig: TAKE 2 **Note De-Identified [place] Obfuscation** TABLETS BY MOUTH ONCE DAILY  ?  ? Not Delegated - Psychiatry:  Antidepressants - SSRI - sertraline Failed - 10/29/2021  5:33 AM  ?  ?  Failed - This refill cannot be delegated  ?  ?  Passed - AST in normal range and within 360 days  ?  AST  ?Date Value Ref Range Status  ?10/06/2021 19 0 - 40 IU/L Final  ? ?SGOT(AST)  ?Date Value Ref Range Status  ?05/17/2014 19 15 - 37 Unit/L Final  ?  ?  ?  ?  Passed - ALT in normal range and within 360 days  ?  ALT  ?Date Value Ref Range Status  ?10/06/2021 16 0 - 32 IU/L Final  ? ?SGPT (ALT)  ?Date Value Ref Range Status  ?05/17/2014 28 U/L Final  ?  Comment:  ?  14-63 ?NOTE: New Reference Range ?02/20/14 ?  ?  ?  ?  ?  Passed - Completed PHQ-2 or PHQ-9 in the last 360 days  ?  ?  Passed - Valid encounter within last 6 months  ?  Recent Outpatient Visits   ? ?      ? 6 days ago Left lower quadrant abdominal pain  ? [facility] [place], [place] T, NP  ? 3 weeks ago Left lower quadrant abdominal pain  ? [place], [place] T, NP  ? 3 months ago Depression, major, single episode, moderate ([place])  ? [place], [place] T, NP  ? 5 months ago Sinus headache  ? [facility], Lauren A, NP  ? 6 months ago Sinus headache  ? [facility] [place], Henrine Screws T, NP  ? ?  ?  ?Future Appointments   ? ?        ? In 1 month Cannady, Barbaraann Faster, NP [employer]  ? ?  ? ?  ?  ?  ?  ?

## 2021-11-04 ENCOUNTER — Other Ambulatory Visit: Payer: Self-pay | Admitting: Nurse Practitioner

## 2021-11-04 MED ORDER — ALBUTEROL SULFATE HFA 108 (90 BASE) MCG/ACT IN AERS
2.0000 | INHALATION_SPRAY | Freq: Four times a day (QID) | RESPIRATORY_TRACT | 4 refills | Status: DC | PRN
Start: 2021-11-04 — End: 2024-03-07

## 2021-12-14 NOTE — Patient Instructions (Incomplete)
**Note De-identified Amezcua Obfuscation** COPD and Physical Activity ?Chronic obstructive pulmonary disease (COPD) is a long-term, or chronic, condition that affects the lungs. COPD is a general term that can be used to describe many problems that cause inflammation of the lungs and limit airflow. These conditions include chronic bronchitis and emphysema. ?The main symptom of COPD is shortness of breath, which makes it harder to do even simple tasks. This can also make it harder to exercise and stay active. Talk with your health care provider about treatments to help you breathe better and actions you can take to prevent breathing problems during physical activity. ?What are the benefits of exercising when you have COPD? ?Exercising regularly is an important part of a healthy lifestyle. You can still exercise and do physical activities even though you have COPD. Exercise and physical activity improve your shortness of breath by increasing blood flow (circulation). This causes your heart to pump more oxygen through your body. Moderate exercise can: ?Improve oxygen use. ?Increase your energy level. ?Help with shortness of breath. ?Strengthen your breathing muscles. ?Improve heart health. ?Help with sleep. ?Improve your self-esteem and feelings of self-worth. ?Lower depression, stress, and anxiety. ?Exercise can benefit everyone with COPD. The severity of your disease may affect how hard you can exercise, especially at first, but everyone can benefit. Talk with your health care provider about how much exercise is safe for you, and which activities and exercises are safe for you. ?What actions can I take to prevent breathing problems during physical activity? ?Sign up for a pulmonary rehabilitation program. This type of program may include: ?Education about lung diseases. ?Exercise classes that teach you how to exercise and be more active while improving your breathing. This usually involves: ?Exercise using your lower extremities, such as a stationary  bicycle. ?About 30 minutes of exercise, 2 to 5 times per week, for 6 to 12 weeks. ?Strength training, such as push-ups or leg lifts. ?Nutrition education. ?Group classes in which you can talk with others who also have COPD and learn ways to manage stress. ?If you use an oxygen tank, you should use it while you exercise. Work with your health care provider to adjust your oxygen for your physical activity. Your resting flow rate is different from your flow rate during physical activity. ?How to manage your breathing while exercising ?While you are exercising: ?Take slow breaths. ?Pace yourself, and do nottry to go too fast. ?Purse your lips while breathing out. Pursing your lips is similar to a kissing or whistling position. ?If doing exercise that uses a quick burst of effort, such as weight lifting: ?Breathe in before starting the exercise. ?Breathe out during the hardest part of the exercise, such as raising the weights. ?Where to find support ?You can find support for exercising with COPD from: ?Your health care provider. ?A pulmonary rehabilitation program. ?Your local health department or community health programs. ?Support groups, either online or in-person. Your health care provider may be able to recommend support groups. ?Where to find more information ?You can find more information about exercising with COPD from: ?American Lung Association: lung.org ?COPD Foundation: copdfoundation.org ?Contact a health care provider if: ?Your symptoms get worse. ?You have nausea. ?You have a fever. ?You want to start a new exercise program or a new activity. ?Get help right away if: ?You have chest pain. ?You cannot breathe. ?These symptoms may represent a serious problem that is an emergency. Do not wait to see if the symptoms will go away. Get medical help right away. Call  **Note De-identified Davidoff Obfuscation** your local emergency services (911 in the [place] Do not drive yourself to the hospital. ?Summary ?COPD is a general term that can be used to describe  many different lung problems that cause lung inflammation and limit airflow. This includes chronic bronchitis and emphysema. ?Exercise and physical activity improve your shortness of breath by increasing blood flow (circulation). This causes your heart to provide more oxygen to your body. ?Contact your health care provider before starting any exercise program or new activity. Ask your health care provider what exercises and activities are safe for you. ?This information is not intended to replace advice given to you by your health care provider. Make sure you discuss any questions you have with your health care provider. ?Document Revised: 05/28/2020 Document Reviewed: 05/28/2020 ?Elsevier Patient Education ? 2023 Elsevier Inc. ? ?

## 2021-12-19 ENCOUNTER — Ambulatory Visit: Payer: 59 | Admitting: Nurse Practitioner

## 2021-12-19 DIAGNOSIS — F321 Major depressive disorder, single episode, moderate: Secondary | ICD-10-CM

## 2021-12-19 DIAGNOSIS — I700 Atherosclerosis of aorta: Secondary | ICD-10-CM

## 2021-12-19 DIAGNOSIS — J432 Centrilobular emphysema: Secondary | ICD-10-CM

## 2021-12-19 DIAGNOSIS — F5101 Primary insomnia: Secondary | ICD-10-CM

## 2021-12-19 DIAGNOSIS — M8588 Other specified disorders of bone density and structure, other site: Secondary | ICD-10-CM

## 2021-12-19 DIAGNOSIS — F411 Generalized anxiety disorder: Secondary | ICD-10-CM

## 2021-12-19 DIAGNOSIS — E7800 Pure hypercholesterolemia, unspecified: Secondary | ICD-10-CM

## 2021-12-19 DIAGNOSIS — E559 Vitamin D deficiency, unspecified: Secondary | ICD-10-CM

## 2021-12-19 DIAGNOSIS — F17210 Nicotine dependence, cigarettes, uncomplicated: Secondary | ICD-10-CM

## 2021-12-19 DIAGNOSIS — K219 Gastro-esophageal reflux disease without esophagitis: Secondary | ICD-10-CM

## 2022-02-12 ENCOUNTER — Other Ambulatory Visit: Payer: Self-pay | Admitting: Nurse Practitioner

## 2022-02-12 NOTE — Telephone Encounter (Signed)
**Note De-Identified Rendleman Obfuscation** Requested Prescriptions  Pending Prescriptions Disp Refills  . omeprazole (PRILOSEC) 20 MG capsule [Pharmacy Med Name: Omeprazole 20 MG Oral Capsule Delayed Release] 60 capsule 0    Sig: TAKE 1 CAPSULE BY MOUTH TWICE DAILY BEFORE A MEAL     Gastroenterology: Proton Pump Inhibitors Passed - 02/12/2022  2:28 PM      Passed - Valid encounter within last 12 months    Recent Outpatient Visits          3 months ago Left lower quadrant abdominal pain   [place], Jolene T, NP   4 months ago Left lower quadrant abdominal pain   [place], [place] T, NP   7 months ago Depression, major, single episode, moderate ([place])   [place], Norwood T, NP   8 months ago Sinus headache   [place], Lauren A, NP   10 months ago Sinus headache   [place], Barbaraann Faster, NP

## 2022-03-19 ENCOUNTER — Other Ambulatory Visit: Payer: Self-pay | Admitting: Nurse Practitioner

## 2022-03-27 ENCOUNTER — Encounter: Payer: Self-pay | Admitting: Nurse Practitioner

## 2022-03-27 ENCOUNTER — Ambulatory Visit (INDEPENDENT_AMBULATORY_CARE_PROVIDER_SITE_OTHER): Payer: 59 | Admitting: Nurse Practitioner

## 2022-03-27 VITALS — BP 136/88 | HR 76 | Temp 98.9°F | Ht 60.39 in | Wt 99.6 lb

## 2022-03-27 DIAGNOSIS — R109 Unspecified abdominal pain: Secondary | ICD-10-CM

## 2022-03-27 DIAGNOSIS — F321 Major depressive disorder, single episode, moderate: Secondary | ICD-10-CM | POA: Diagnosis not present

## 2022-03-27 DIAGNOSIS — F5101 Primary insomnia: Secondary | ICD-10-CM

## 2022-03-27 DIAGNOSIS — J432 Centrilobular emphysema: Secondary | ICD-10-CM | POA: Diagnosis not present

## 2022-03-27 DIAGNOSIS — K219 Gastro-esophageal reflux disease without esophagitis: Secondary | ICD-10-CM

## 2022-03-27 DIAGNOSIS — I700 Atherosclerosis of aorta: Secondary | ICD-10-CM

## 2022-03-27 DIAGNOSIS — G8929 Other chronic pain: Secondary | ICD-10-CM

## 2022-03-27 DIAGNOSIS — F411 Generalized anxiety disorder: Secondary | ICD-10-CM

## 2022-03-27 DIAGNOSIS — E782 Mixed hyperlipidemia: Secondary | ICD-10-CM

## 2022-03-27 DIAGNOSIS — K580 Irritable bowel syndrome with diarrhea: Secondary | ICD-10-CM

## 2022-03-27 DIAGNOSIS — F17210 Nicotine dependence, cigarettes, uncomplicated: Secondary | ICD-10-CM

## 2022-03-27 MED ORDER — HYDROXYZINE PAMOATE 25 MG PO CAPS: ORAL_CAPSULE | ORAL | 4 refills | Status: DC

## 2022-03-27 MED ORDER — MONTELUKAST SODIUM 10 MG PO TABS: 10.0000 mg | ORAL_TABLET | Freq: Every day | ORAL | 4 refills | Status: DC

## 2022-03-27 MED ORDER — TIZANIDINE HCL 4 MG PO TABS
4.0000 mg | ORAL_TABLET | Freq: Four times a day (QID) | ORAL | 4 refills | Status: DC | PRN
Start: 2022-03-27 — End: 2022-03-27

## 2022-03-27 MED ORDER — OMEPRAZOLE 20 MG PO CPDR
DELAYED_RELEASE_CAPSULE | ORAL | 4 refills | Status: DC
Start: 2022-03-27 — End: 2022-03-27

## 2022-03-27 MED ORDER — ROSUVASTATIN CALCIUM 10 MG PO TABS: 10.0000 mg | ORAL_TABLET | Freq: Every day | ORAL | 4 refills | Status: DC

## 2022-03-27 MED ORDER — TIZANIDINE HCL 4 MG PO TABS: 4.0000 mg | ORAL_TABLET | Freq: Four times a day (QID) | ORAL | 4 refills | Status: DC | PRN

## 2022-03-27 MED ORDER — OMEPRAZOLE 20 MG PO CPDR
DELAYED_RELEASE_CAPSULE | ORAL | 4 refills | Status: DC
Start: 2022-03-27 — End: 2023-01-25

## 2022-03-27 MED ORDER — SERTRALINE HCL 100 MG PO TABS: 200.0000 mg | ORAL_TABLET | Freq: Every day | ORAL | 4 refills | Status: DC

## 2022-03-27 MED ORDER — MONTELUKAST SODIUM 10 MG PO TABS
10.0000 mg | ORAL_TABLET | Freq: Every day | ORAL | 4 refills | Status: DC
Start: 2022-03-27 — End: 2024-03-07

## 2022-03-27 MED ORDER — ONDANSETRON HCL 4 MG PO TABS: 4.0000 mg | ORAL_TABLET | Freq: Three times a day (TID) | ORAL | 1 refills | Status: DC | PRN

## 2022-03-27 NOTE — Assessment & Plan Note (Signed)
**Note De-Identified Pilley Obfuscation** Ongoing.  Noted on lung screening in July 2021.  Recommend complete cessation of smoking.  She agrees with statin initiation today, will trial Rosuvastatin 10 MG daily -- educated her on this medication and use + side effects to report.

## 2022-03-27 NOTE — Assessment & Plan Note (Signed)
**Note De-Identified Hollenbeck Obfuscation** Chronic, ongoing.  Denies SI/HI.  Will continue Sertraline 200 MG daily + continue Vistaril as needed (change to Buspar in future).  This benefits both anxiety and depression elements. Return to office in 6 months.

## 2022-03-27 NOTE — Assessment & Plan Note (Signed)
**Note De-identified Langseth Obfuscation** Chronic, ongoing.  Continue Prilosec BID dosing.  Recommend heavy focus on diet and keeping food journal, eliminating foods that cause discomfort.  Mag level annually.  Discussed the risks and benefits of long term PPI use including but not limited to bone loss, chronic kidney disease, infections, low magnesium.  Will aim to use at the lowest dose for the shortest period of time.   

## 2022-03-27 NOTE — Assessment & Plan Note (Signed)
**Note De-Identified Dougher Obfuscation** Chronic, ongoing.  She agrees with statin initiation today, educated her at length on medication and risk/benefit + side effects.  Start Rosuvastatin 10 MG nightly and return in 8 weeks.

## 2022-03-27 NOTE — Assessment & Plan Note (Signed)
**Note De-Identified Kothari Obfuscation** Ongoing issue with frequent diverticulitis in past and underlying IBS.  Referral to GI placed and discussed with patient.

## 2022-03-27 NOTE — Assessment & Plan Note (Signed)
**Note De-Identified Terhune Obfuscation** Chronic, ongoing. Continue Albuterol inhaler PRN and Singulair daily.  Adjust inhaler regimen as needed, consider maintenance if worsening.  Consider spirometry at next visit.  No recent exacerbations.

## 2022-03-27 NOTE — Assessment & Plan Note (Signed)
**Note De-identified Guido Obfuscation** I have recommended complete cessation of tobacco use. I have discussed various options available for assistance with tobacco cessation including over the counter methods (Nicotine gum, patch and lozenges). We also discussed prescription options (Chantix, Nicotine Inhaler / Nasal Spray). The patient is not interested in pursuing any prescription tobacco cessation options at this time. Continue yearly lung CT screening -- referral placed.  

## 2022-03-27 NOTE — Progress Notes (Signed)
**Note De-Identified Zingaro Obfuscation** BP 136/88 (BP Location: Left Arm, Patient Position: Sitting, Cuff Size: Normal)   Pulse 76   Temp 98.9 F (37.2 C) (Oral)   Ht 5' 0.39" (1.534 m)   Wt 99 lb 9.6 oz (45.2 kg)   SpO2 97%   BMI 19.20 kg/m    Subjective:    Patient ID: Haley [name], female    DOB: 08-May-1961, 61 y.o.   MRN: [id number]  HPI: Haley [name] is a 60 y.o. female  Chief Complaint  Patient presents with   Mood   COPD   Abdominal Pain   Hypertension   COPD Continues on Albuterol as needed + Flonase and Singulair.   Continues to smoke 1/2 to 1 PPD, does endorse needing to quit but not at this time.  She started smoking at age 54.  Had initial annual lung screening on 02/08/2020 -- noted aortic atherosclerosis and emphysema. COPD status: stable Satisfied with current treatment?: yes Oxygen use: no Dyspnea frequency: very rare Cough frequency: none Rescue inhaler frequency:  rarely Limitation of activity: no Productive cough:  Last Spirometry: 06/16/2016 Pneumovax: Up to Date Influenza: Up to Date    HYPERLIPIDEMIA No current medications at this time. Hyperlipidemia status: good compliance Satisfied with current treatment?  yes Side effects:  no Medication compliance: good compliance Past cholesterol meds: none Supplements: none Aspirin:  no The 10-year ASCVD risk score (Arnett DK, et al., 2019) is: 8.6%   Values used to calculate the score:     Age: 60 years     Sex: Female     Is Non-Hispanic African American: No     Diabetic: No     Tobacco smoker: Yes     Systolic Blood Pressure: 025 mmHg     Is BP treated: No     HDL Cholesterol: 53 mg/dL     Total Cholesterol: 182 mg/dL Chest pain:  no Coronary artery disease:  no Family history CAD:  no Family history early CAD:  no   ABDOMINAL PAIN  History of frequent diverticulitis flares, last treated 10/06/21.  Still continuing to have vomiting spells -- once a month or every 3 months.  Dr. Bary Castilla followed for a long while for  colonoscopies -- due next 01/08/2026.  Intermittent abdominal pain with the nausea and vomiting. She varies between constipation and diarrhea.   Takes Protonix BID. Duration:months Onset:  intermittent Severity: 2-8/10 Quality: aching and stabbing Location:  LLQ  Episode duration: hours Radiation: no Frequency: intermittent Alleviating factors: muscle relaxers and sleep Aggravating factors: nothing Status: fluctuating Treatments attempted: muscle relaxer and rest Fever: no Nausea: yes Vomiting: yes Weight loss: no Decreased appetite:  varies Diarrhea: yes Constipation: yes Blood in stool: no Heartburn: no Jaundice: no Rash: no Dysuria/urinary frequency: no Hematuria: no History of sexually transmitted disease: no Recurrent NSAID use: no    DEPRESSION Continues on Sertraline 200 MG (has been off of this one week) + continues Vistaril as needed (takes one daily). Takes Trazodone 50 MG for sleep as needed.   Mood status: stable Satisfied with current treatment?: yes Symptom severity: mild  Duration of current treatment : chronic Side effects: no Medication compliance: good compliance Psychotherapy/counseling: none Previous psychiatric medications: Zoloft, Trazodone, Vistaril Depressed mood: yes Anxious mood: yes Anhedonia: no Significant weight loss or gain: no Insomnia: yes Fatigue: no Feelings of worthlessness or guilt: no Impaired concentration/indecisiveness: no Suicidal ideations: no Hopelessness: no Crying spells: no    03/27/2022    3:58 PM 10/24/2021 **Note De-Identified Bents Obfuscation** 1:56 PM 10/06/2021    3:44 PM 07/14/2021    3:22 PM 04/17/2021    8:23 AM  Depression screen PHQ 2/9  Decreased Interest 2 1 2 2  0  Down, Depressed, Hopeless 1 0 1 2 0  PHQ - 2 Score 3 1 3 4  0  Altered sleeping 3 3 3 2 3   Tired, decreased energy 3 2 3 2 3   Change in appetite 3 0 1 0 0  Feeling bad or failure about yourself  0 0 0 0 0  Trouble concentrating 0 0 1 0 0  Moving slowly or fidgety/restless 0 0  0 0 0  Suicidal thoughts 0 0 0 0 0  PHQ-9 Score 12 6 11 8 6   Difficult doing work/chores Somewhat difficult Not difficult at all  Somewhat difficult Not difficult at all       10/24/2021    1:57 PM 10/06/2021    3:44 PM 07/14/2021    3:22 PM 09/17/2020    4:00 PM  GAD 7 : Generalized Anxiety Score  Nervous, Anxious, on Edge 2 3 1 3   Control/stop worrying 0 1 0 1  Worry too much - different things 2 3 1 3   Trouble relaxing 2 2 1 3   Restless 0 1 1 2   Easily annoyed or irritable 2 3 3 3   Afraid - awful might happen 0 3 0 3  Total GAD 7 Score 8 16 7 18   Anxiety Difficulty Not difficult at all Somewhat difficult Somewhat difficult     Relevant past medical, surgical, family and social history reviewed and updated as indicated. Interim medical history since our last visit reviewed. Allergies and medications reviewed and updated.  Review of Systems  Constitutional:  Negative for activity change, appetite change, diaphoresis, fatigue and fever.  Respiratory:  Negative for cough, chest tightness and shortness of breath.   Cardiovascular:  Negative for chest pain, palpitations and leg swelling.  Gastrointestinal:  Positive for abdominal pain, constipation, diarrhea and nausea. Negative for abdominal distention and vomiting.  Neurological: Negative.   Psychiatric/Behavioral:  Negative for decreased concentration, self-injury, sleep disturbance and suicidal ideas. The patient is not nervous/anxious.     Per HPI unless specifically indicated above     Objective:    BP 136/88 (BP Location: Left Arm, Patient Position: Sitting, Cuff Size: Normal)   Pulse 76   Temp 98.9 F (37.2 C) (Oral)   Ht 5' 0.39" (1.534 m)   Wt 99 lb 9.6 oz (45.2 kg)   SpO2 97%   BMI 19.20 kg/m   Wt Readings from Last 3 Encounters:  03/27/22 99 lb 9.6 oz (45.2 kg)  10/24/21 102 lb 3.2 oz (46.4 kg)  10/06/21 99 lb 6.4 oz (45.1 kg)    Physical Exam Vitals and nursing note reviewed.  Constitutional:       General: She is awake. She is not in acute distress.    Appearance: She is well-developed. She is not ill-appearing or toxic-appearing.  HENT:     Head: Normocephalic.     Right Ear: Hearing normal.     Left Ear: Hearing normal.     Nose: Nose normal.     Mouth/Throat:     Dentition: Abnormal dentition.  Eyes:     General: Lids are normal.        Right eye: No discharge.        Left eye: No discharge.     Conjunctiva/sclera: Conjunctivae normal.     Pupils: Pupils are equal, **Note De-Identified Jemmott Obfuscation** round, and reactive to light.  Neck:     Thyroid: No thyromegaly.     Vascular: No carotid bruit or JVD.  Cardiovascular:     Rate and Rhythm: Normal rate and regular rhythm.     Heart sounds: Normal heart sounds. No murmur heard.    No gallop.  Pulmonary:     Effort: Pulmonary effort is normal. No accessory muscle usage or respiratory distress.     Breath sounds: Normal breath sounds.  Abdominal:     General: Bowel sounds are normal. There is no distension.     Palpations: Abdomen is soft.     Tenderness: There is no abdominal tenderness.  Musculoskeletal:     Cervical back: Normal range of motion and neck supple.     Right lower leg: No edema.     Left lower leg: No edema.  Lymphadenopathy:     Head:     Right side of head: No submental, submandibular, tonsillar, preauricular or posterior auricular adenopathy.     Left side of head: No submental, submandibular, tonsillar, preauricular or posterior auricular adenopathy.     Cervical: No cervical adenopathy.  Skin:    General: Skin is warm and dry.  Neurological:     Mental Status: She is alert and oriented to person, place, and time.     Deep Tendon Reflexes: Reflexes are normal and symmetric.     Reflex Scores:      Brachioradialis reflexes are 2+ on the right side and 2+ on the left side.      Patellar reflexes are 2+ on the right side and 2+ on the left side. Psychiatric:        Attention and Perception: Attention normal.        Mood and  Affect: Mood normal.        Speech: Speech normal.        Behavior: Behavior normal. Behavior is cooperative.        Thought Content: Thought content normal.    Results for orders placed or performed in visit on 10/06/21  CBC with Differential/Platelet  Result Value Ref Range   WBC 7.0 3.4 - 10.8 x10E3/uL   RBC 4.74 3.77 - 5.28 x10E6/uL   Hemoglobin 14.6 11.1 - 15.9 g/dL   Hematocrit 44.4 34.0 - 46.6 %   MCV 94 79 - 97 fL   MCH 30.8 26.6 - 33.0 pg   MCHC 32.9 31.5 - 35.7 g/dL   RDW 13.6 11.7 - 15.4 %   Platelets 389 150 - 450 x10E3/uL   Neutrophils 57 Not Estab. %   Lymphs 33 Not Estab. %   Monocytes 7 Not Estab. %   Eos 2 Not Estab. %   Basos 1 Not Estab. %   Neutrophils Absolute 4.0 1.4 - 7.0 x10E3/uL   Lymphocytes Absolute 2.4 0.7 - 3.1 x10E3/uL   Monocytes Absolute 0.5 0.1 - 0.9 x10E3/uL   EOS (ABSOLUTE) 0.1 0.0 - 0.4 x10E3/uL   Basophils Absolute 0.1 0.0 - 0.2 x10E3/uL   Immature Granulocytes 0 Not Estab. %   Immature Grans (Abs) 0.0 0.0 - 0.1 x10E3/uL  Comprehensive metabolic panel  Result Value Ref Range   Glucose 97 70 - 99 mg/dL   BUN 9 8 - 27 mg/dL   Creatinine, Ser 0.79 0.57 - 1.00 mg/dL   eGFR 85 >59 mL/min/1.73   BUN/Creatinine Ratio 11 (L) 12 - 28   Sodium 142 134 - 144 mmol/L   Potassium 4.3 3.5 - 5.2 mmol/L **Note De-Identified Delisle Obfuscation** Chloride 105 96 - 106 mmol/L   CO2 22 20 - 29 mmol/L   Calcium 9.9 8.7 - 10.3 mg/dL   Total Protein 7.5 6.0 - 8.5 g/dL   Albumin 4.7 3.8 - 4.8 g/dL   Globulin, Total 2.8 1.5 - 4.5 g/dL   Albumin/Globulin Ratio 1.7 1.2 - 2.2   Bilirubin Total 0.4 0.0 - 1.2 mg/dL   Alkaline Phosphatase 132 (H) 44 - 121 IU/L   AST 19 0 - 40 IU/L   ALT 16 0 - 32 IU/L  TSH  Result Value Ref Range   TSH 2.320 0.450 - 4.500 uIU/mL      Assessment & Plan:   Problem List Items Addressed This Visit       Cardiovascular and Mediastinum   Aortic atherosclerosis ([place])    Ongoing.  Noted on lung screening in July 2021.  Recommend complete cessation of smoking.  She  agrees with statin initiation today, will trial Rosuvastatin 10 MG daily -- educated her on this medication and use + side effects to report.       Relevant Medications   rosuvastatin (CRESTOR) 10 MG tablet     Respiratory   Centrilobular emphysema ([facility])    Chronic, ongoing. Continue Albuterol inhaler PRN and Singulair daily.  Adjust inhaler regimen as needed, consider maintenance if worsening.  Consider spirometry at next visit.  No recent exacerbations.      Relevant Medications   montelukast (SINGULAIR) 10 MG tablet   Other Relevant Orders   Ambulatory Referral Lung Cancer Screening [facility]     Digestive   GERD (gastroesophageal reflux disease)    Chronic, ongoing.  Continue Prilosec BID dosing.  Recommend heavy focus on diet and keeping food journal, eliminating foods that cause discomfort.  Mag level annually.  Discussed the risks and benefits of long term PPI use including but not limited to bone loss, chronic kidney disease, infections, low magnesium.  Will aim to use at the lowest dose for the shortest period of time.        Relevant Medications   omeprazole (PRILOSEC) 20 MG capsule   ondansetron (ZOFRAN) 4 MG tablet   IBS (irritable bowel syndrome)    Chronic, ongoing issue with continue symptoms.  Referral to GI.      Relevant Medications   omeprazole (PRILOSEC) 20 MG capsule   ondansetron (ZOFRAN) 4 MG tablet   Other Relevant Orders   Ambulatory referral to Gastroenterology     Other   Chronic abdominal pain    Ongoing issue with frequent diverticulitis in past and underlying IBS.  Referral to GI placed and discussed with patient.      Relevant Medications   tiZANidine (ZANAFLEX) 4 MG tablet   sertraline (ZOLOFT) 100 MG tablet   Other Relevant Orders   Ambulatory referral to Gastroenterology   Cigarette nicotine dependence without complication    I have recommended complete cessation of tobacco use. I have discussed various options available for  assistance with tobacco cessation including over the counter methods (Nicotine gum, patch and lozenges). We also discussed prescription options (Chantix, Nicotine Inhaler / Nasal Spray). The patient is not interested in pursuing any prescription tobacco cessation options at this time. Continue yearly lung CT screening -- referral placed.       Relevant Orders   Ambulatory Referral Lung Cancer Screening [facility]   Depression, major, single episode, moderate ([place]) - Primary    Chronic, ongoing.  Denies SI/HI.  Will continue Sertraline 200 MG, offering her **Note De-Identified Suk Obfuscation** benefit at this time - refills sent.  This benefits both anxiety and depression elements.  Continue Vistaril as needed, consider change to Buspar in future.   Follow-up in 6 months.      Relevant Medications   sertraline (ZOLOFT) 100 MG tablet   hydrOXYzine (VISTARIL) 25 MG capsule   Generalized anxiety disorder    Chronic, ongoing.  Denies SI/HI.  Will continue Sertraline 200 MG daily + continue Vistaril as needed (change to Buspar in future).  This benefits both anxiety and depression elements. Return to office in 6 months.      Relevant Medications   sertraline (ZOLOFT) 100 MG tablet   hydrOXYzine (VISTARIL) 25 MG capsule   Insomnia    Chronic, ongoing.  Will continue Trazodone nightly, which offers benefit.   ?Maintain a regular sleep schedule, particularly a regular wake-up time in the morning ?Try not to force sleep ?Avoid caffeinated beverages after lunch ?Avoid alcohol near bedtime (eg, late afternoon and evening)  ?Avoid smoking or other nicotine intake, particularly during the evening ?Adjust the bedroom environment as needed to decrease stimuli (eg, reduce ambient light, turn off the television or radio) ?Avoid prolonged use of light-emitting screens (laptops, tablets, smartphones, ebooks) before bedtime  ?Resolve concerns or worries before bedtime ?Exercise regularly for at least 20 minutes, preferably more than four  to five hours prior to bedtime  ?Avoid daytime naps, especially if they are longer than 20 to 30 minutes or occur late in the day       Mixed hyperlipidemia    Chronic, ongoing.  She agrees with statin initiation today, educated her at length on medication and risk/benefit + side effects.  Start Rosuvastatin 10 MG nightly and return in 8 weeks.      Relevant Medications   rosuvastatin (CRESTOR) 10 MG tablet   Other Relevant Orders   Comprehensive metabolic panel   Lipid Panel w/o Chol/HDL Ratio     Follow up plan: Return in about 8 weeks (around 05/22/2022) for HLD & COPD with spirometry.

## 2022-03-27 NOTE — Assessment & Plan Note (Signed)
**Note De-identified Cavagnaro Obfuscation** Chronic, ongoing.  Will continue Trazodone nightly, which offers benefit.   ?Maintain a regular sleep schedule, particularly a regular wake-up time in the morning ?Try not to force sleep ?Avoid caffeinated beverages after lunch ?Avoid alcohol near bedtime (eg, late afternoon and evening)  ?Avoid smoking or other nicotine intake, particularly during the evening ?Adjust the bedroom environment as needed to decrease stimuli (eg, reduce ambient light, turn off the television or radio) ?Avoid prolonged use of light-emitting screens (laptops, tablets, smartphones, ebooks) before bedtime  ?Resolve concerns or worries before bedtime ?Exercise regularly for at least 20 minutes, preferably more than four to five hours prior to bedtime  ?Avoid daytime naps, especially if they are longer than 20 to 30 minutes or occur late in the day  

## 2022-03-27 NOTE — Patient Instructions (Signed)
**Note De-Identified Im Obfuscation** Start Metamucil 2 gummies daily.  Constipation, Adult Constipation is when a person has trouble pooping (having a bowel movement). When you have this condition, you may poop fewer than 3 times a week. Your poop (stool) may also be dry, hard, or bigger than normal. Follow these instructions at home: Eating and drinking  Eat foods that have a lot of fiber, such as: Fresh fruits and vegetables. Whole grains. Beans. Eat less of foods that are low in fiber and high in fat and sugar, such as: [place] fries. Hamburgers. Cookies. Candy. Soda. Drink enough fluid to keep your pee (urine) pale yellow. General instructions Exercise regularly or as told by your doctor. Try to do 150 minutes of exercise each week. Go to the restroom when you feel like you need to poop. Do not hold it in. Take over-the-counter and prescription medicines only as told by your doctor. These include any fiber supplements. When you poop: Do deep breathing while relaxing your lower belly (abdomen). Relax your pelvic floor. The pelvic floor is a group of muscles that support the rectum, bladder, and intestines (as well as the uterus in women). Watch your condition for any changes. Tell your doctor if you notice any. Keep all follow-up visits as told by your doctor. This is important. Contact a doctor if: You have pain that gets worse. You have a fever. You have not pooped for 4 days. You vomit. You are not hungry. You lose weight. You are bleeding from the opening of the butt (anus). You have thin, pencil-like poop. Get help right away if: You have a fever, and your symptoms suddenly get worse. You leak poop or have blood in your poop. Your belly feels hard or bigger than normal (bloated). You have very bad belly pain. You feel dizzy or you faint. Summary Constipation is when a person poops fewer than 3 times a week, has trouble pooping, or has poop that is dry, hard, or bigger than normal. Eat foods that have a  lot of fiber. Drink enough fluid to keep your pee (urine) pale yellow. Take over-the-counter and prescription medicines only as told by your doctor. These include any fiber supplements. This information is not intended to replace advice given to you by your health care provider. Make sure you discuss any questions you have with your health care provider. Document Revised: 06/07/2019 Document Reviewed: 06/07/2019 Elsevier Patient Education  [place].

## 2022-03-27 NOTE — Assessment & Plan Note (Signed)
**Note De-Identified Arney Obfuscation** Chronic, ongoing.  Denies SI/HI.  Will continue Sertraline 200 MG, offering her benefit at this time - refills sent.  This benefits both anxiety and depression elements.  Continue Vistaril as needed, consider change to Buspar in future.   Follow-up in 6 months.

## 2022-03-27 NOTE — Assessment & Plan Note (Signed)
**Note De-Identified Flannigan Obfuscation** Chronic, ongoing issue with continue symptoms.  Referral to GI.

## 2022-03-28 ENCOUNTER — Other Ambulatory Visit: Payer: Self-pay | Admitting: Nurse Practitioner

## 2022-03-28 DIAGNOSIS — E876 Hypokalemia: Secondary | ICD-10-CM

## 2022-03-28 LAB — COMPREHENSIVE METABOLIC PANEL
ALT: 13 IU/L (ref 0–32)
AST: 19 IU/L (ref 0–40)
Albumin/Globulin Ratio: 1.6 (ref 1.2–2.2)
Albumin: 4.6 g/dL (ref 3.9–4.9)
Alkaline Phosphatase: 109 IU/L (ref 44–121)
BUN/Creatinine Ratio: 10 — ABNORMAL LOW (ref 12–28)
BUN: 8 mg/dL (ref 8–27)
Bilirubin Total: 0.9 mg/dL (ref 0.0–1.2)
CO2: 24 mmol/L (ref 20–29)
Calcium: 9.6 mg/dL (ref 8.7–10.3)
Chloride: 99 mmol/L (ref 96–106)
Creatinine, Ser: 0.81 mg/dL (ref 0.57–1.00)
Globulin, Total: 2.9 g/dL (ref 1.5–4.5)
Glucose: 102 mg/dL — ABNORMAL HIGH (ref 70–99)
Potassium: 3 mmol/L — ABNORMAL LOW (ref 3.5–5.2)
Sodium: 141 mmol/L (ref 134–144)
Total Protein: 7.5 g/dL (ref 6.0–8.5)
eGFR: 82 mL/min/{1.73_m2} (ref >59)

## 2022-03-28 LAB — LIPID PANEL W/O CHOL/HDL RATIO
Cholesterol, Total: 248 mg/dL — ABNORMAL HIGH (ref 100–199)
HDL: 48 mg/dL (ref >39)
LDL Chol Calc (NIH): 145 mg/dL — ABNORMAL HIGH (ref 0–99)
Triglycerides: 300 mg/dL — ABNORMAL HIGH (ref 0–149)
VLDL Cholesterol Cal: 55 mg/dL — ABNORMAL HIGH (ref 5–40)

## 2022-03-28 MED ORDER — POTASSIUM CHLORIDE CRYS ER 10 MEQ PO TBCR: 10.0000 meq | EXTENDED_RELEASE_TABLET | Freq: Every day | ORAL | 0 refills | Status: DC

## 2022-03-28 NOTE — Progress Notes (Signed)
**Note De-Identified Thurlow Obfuscation** Contacted Zollner Franklin -- needs lab visit in one week please, sent in a little potassium for her.  Good morning Haley [name], your labs have returned: - Kidney function, creatinine and eGFR, remains normal, as is liver function, AST and ALT.   - Potassium level is low, I am sending in a little potassium for you to take for 4 days and then return next week for lab only check.  Also increase potassium rich foods like bananas, mangoes, dried fruit, potatoes.  Staff will call to schedule lab visit. - Cholesterol labs are elevated -- as we discussed start Rosuvastatin every evening.  Any questions? Keep being amazing!!  Thank you for allowing me to participate in your care.  I appreciate you. Kindest regards, Shellee Streng

## 2022-04-08 ENCOUNTER — Other Ambulatory Visit (INDEPENDENT_AMBULATORY_CARE_PROVIDER_SITE_OTHER): Payer: 59

## 2022-04-08 ENCOUNTER — Encounter: Payer: Self-pay | Admitting: Nurse Practitioner

## 2022-04-08 ENCOUNTER — Ambulatory Visit (INDEPENDENT_AMBULATORY_CARE_PROVIDER_SITE_OTHER): Payer: 59 | Admitting: Nurse Practitioner

## 2022-04-08 DIAGNOSIS — B029 Zoster without complications: Secondary | ICD-10-CM | POA: Diagnosis not present

## 2022-04-08 MED ORDER — VALACYCLOVIR HCL 1 G PO TABS: 1000.0000 mg | ORAL_TABLET | Freq: Three times a day (TID) | ORAL | 0 refills | Status: AC

## 2022-04-08 NOTE — Assessment & Plan Note (Addendum)
**Note De-Identified Shear Obfuscation** Acute and present <72 hours total.  At this time will start Valtrex 1000 MG TID for 7 days.  Recommend she try Hydrocortisone cream or Voltaren gel at home as needed.  Tylenol for pain. Wash clothes well and avoid contact with rash areas, if contact wash hands well afterwards.  Return in one week.  Shingrix in a couple months.

## 2022-04-08 NOTE — Progress Notes (Signed)
**Note De-Identified Laramee Obfuscation** BP 124/77   Pulse 80   Temp 98.3 F (36.8 C) (Oral)   Ht 5' 0.39" (1.534 m)   Wt 100 lb (45.4 kg)   SpO2 97%   BMI 19.28 kg/m    Subjective:    Patient ID: Haley [name], female    DOB: January 11, 1961, 62 y.o.   MRN: [id number]  HPI: Haley [name] is a 61 y.o. female  Chief Complaint  Patient presents with   Herpes Zoster    Patient is here for possible Shingles. Patient says her sister told her that she is having a Shingles flare up since she works for [employer]. Patient says she noticed the areas on her back and around right breast (nipple). Patient denies trying any medication and says she does not noticed any open blister. Patient says it burning, hurts and feels like something is stinging her.    SHINGLES Started on Sunday, did not feel good all weekend and then rash showed up.  Is not shingles vaccinated. Duration: 04/06/23 Location:   right breast and around back Painful:  yes Severity: 6/10  Paresthesia:  yes Hyperesthesia: no Itching:  no Burning:  yes Oozing:  no Blisters:   unsure can not see back Fevers:  no History of the same:  no Alleviating factors: nothing Status: fluctuating Treatments attempted: nothing  Relevant past medical, surgical, family and social history reviewed and updated as indicated. Interim medical history since our last visit reviewed. Allergies and medications reviewed and updated.  Review of Systems  Constitutional:  Negative for activity change, appetite change, diaphoresis, fatigue and fever.  Respiratory:  Negative for cough, chest tightness and shortness of breath.   Cardiovascular:  Negative for chest pain, palpitations and leg swelling.  Gastrointestinal: Negative.   Skin:  Positive for rash.  Neurological: Negative.   Psychiatric/Behavioral: Negative.      Per HPI unless specifically indicated above     Objective:    BP 124/77   Pulse 80   Temp 98.3 F (36.8 C) (Oral)   Ht 5' 0.39" (1.534 m)   Wt 100 lb (45.4 kg)    SpO2 97%   BMI 19.28 kg/m   Wt Readings from Last 3 Encounters:  04/08/22 100 lb (45.4 kg)  03/27/22 99 lb 9.6 oz (45.2 kg)  10/24/21 102 lb 3.2 oz (46.4 kg)    Physical Exam Vitals and nursing note reviewed.  Constitutional:      General: She is awake. She is not in acute distress.    Appearance: She is well-developed. She is not ill-appearing or toxic-appearing.  HENT:     Head: Normocephalic.     Right Ear: Hearing normal.     Left Ear: Hearing normal.     Nose: Nose normal.     Mouth/Throat:     Dentition: Abnormal dentition.  Eyes:     General: Lids are normal.        Right eye: No discharge.        Left eye: No discharge.     Conjunctiva/sclera: Conjunctivae normal.     Pupils: Pupils are equal, round, and reactive to light.  Neck:     Thyroid: No thyromegaly.     Vascular: No carotid bruit or JVD.  Cardiovascular:     Rate and Rhythm: Normal rate and regular rhythm.     Heart sounds: Normal heart sounds. No murmur heard.    No gallop.  Pulmonary:     Effort: Pulmonary effort is normal. No accessory **Note De-Identified Drinkard Obfuscation** muscle usage or respiratory distress.     Breath sounds: Normal breath sounds.  Abdominal:     General: Bowel sounds are normal. There is no distension.     Palpations: Abdomen is soft.     Tenderness: There is no abdominal tenderness.  Musculoskeletal:     Cervical back: Normal range of motion and neck supple.     Right lower leg: No edema.     Left lower leg: No edema.  Lymphadenopathy:     Head:     Right side of head: No submental, submandibular, tonsillar, preauricular or posterior auricular adenopathy.     Left side of head: No submental, submandibular, tonsillar, preauricular or posterior auricular adenopathy.     Cervical: No cervical adenopathy.  Skin:    General: Skin is warm and dry.     Findings: Rash present. Rash is vesicular.     Comments: Cluster like pattern rash to right upper thoracic area, small vesicles present.  Area with erythema at base.  Areas of small clusters present around right nipple and breast as well.  Neurological:     Mental Status: She is alert and oriented to person, place, and time.     Deep Tendon Reflexes: Reflexes are normal and symmetric.     Reflex Scores:      Brachioradialis reflexes are 2+ on the right side and 2+ on the left side.      Patellar reflexes are 2+ on the right side and 2+ on the left side. Psychiatric:        Attention and Perception: Attention normal.        Mood and Affect: Mood normal.        Speech: Speech normal.        Behavior: Behavior normal. Behavior is cooperative.        Thought Content: Thought content normal.     Results for orders placed or performed in visit on 03/27/22  Comprehensive metabolic panel  Result Value Ref Range   Glucose 102 (H) 70 - 99 mg/dL   BUN 8 8 - 27 mg/dL   Creatinine, Ser 0.81 0.57 - 1.00 mg/dL   eGFR 82 >59 mL/min/1.73   BUN/Creatinine Ratio 10 (L) 12 - 28   Sodium 141 134 - 144 mmol/L   Potassium 3.0 (L) 3.5 - 5.2 mmol/L   Chloride 99 96 - 106 mmol/L   CO2 24 20 - 29 mmol/L   Calcium 9.6 8.7 - 10.3 mg/dL   Total Protein 7.5 6.0 - 8.5 g/dL   Albumin 4.6 3.9 - 4.9 g/dL   Globulin, Total 2.9 1.5 - 4.5 g/dL   Albumin/Globulin Ratio 1.6 1.2 - 2.2   Bilirubin Total 0.9 0.0 - 1.2 mg/dL   Alkaline Phosphatase 109 44 - 121 IU/L   AST 19 0 - 40 IU/L   ALT 13 0 - 32 IU/L  Lipid Panel w/o Chol/HDL Ratio  Result Value Ref Range   Cholesterol, Total 248 (H) 100 - 199 mg/dL   Triglycerides 300 (H) 0 - 149 mg/dL   HDL 48 >39 mg/dL   VLDL Cholesterol Cal 55 (H) 5 - 40 mg/dL   LDL Chol Calc (NIH) 145 (H) 0 - 99 mg/dL      Assessment & Plan:   Problem List Items Addressed This Visit       Other   Shingles    Acute and present <72 hours total.  At this time will start Valtrex 1000 MG TID for 7 days. **Note De-Identified Adriano Obfuscation** Recommend she try Hydrocortisone cream or Voltaren gel at home as needed.  Tylenol for pain. Wash clothes well and avoid contact with rash areas,  if contact wash hands well afterwards.  Return in one week.  Shingrix in a couple months.      Relevant Medications   valACYclovir (VALTREX) 1000 MG tablet     Follow up plan: Return in about 1 week (around 04/15/2022) for Shingles.

## 2022-04-08 NOTE — Patient Instructions (Signed)
**Note De-identified Bridgers Obfuscation** Shingles  Shingles is an infection. It gives you a painful skin rash and blisters that have fluid in them. Shingles is caused by the same germ (virus) that causes chickenpox. Shingles only happens in people who: Have had chickenpox. Have been given a shot (vaccine) to protect against chickenpox. Shingles is rare in this group. What are the causes? This condition is caused by varicella-zoster virus. This is the same germ that causes chickenpox. After a person is exposed to the germ, the germ stays in the body but is not active (dormant). Shingles develops if the germ becomes active again (is reactivated). This can happen many years after the first exposure to the germ. It is not known what causes this germ to become active again. What increases the risk? People who have had chickenpox or received the chickenpox shot are at risk for shingles. This infection is more common in people who: Are older than 62 years of age. Have a weakened disease-fighting system (immune system), such as people with: HIV (human immunodeficiency virus). AIDS (acquired immunodeficiency syndrome). Cancer. Are taking medicines that weaken the immune system, such as organ transplant medicines. Have a lot of stress. What are the signs or symptoms? The first symptoms of shingles may be itching, tingling, or pain in an area on your skin. A rash will show on your skin a few days or weeks later. This is what usually happens: The rash is likely to be on one side of your body. The rash usually has a shape like a belt or a band. Over time, the rash turns into fluid-filled blisters. The blisters will break open and change into scabs. The scabs usually dry up in about 2-3 weeks. You may also have: A fever. Chills. A headache. A feeling like you may vomit (nausea). How is this treated? The rash may last for several weeks. There is not a specific cure for this condition. Your doctor may prescribe medicines. Medicines  may: Help with pain. Help you get better sooner. Help to prevent long-term problems. Help with itching (antihistamines). If the area involved is on your face, you may need to see a specialist. This may be an eye doctor or an ear, nose, and throat (ENT) doctor. Follow these instructions at home: Medicines Take over-the-counter and prescription medicines only as told by your doctor. Put on an anti-itch cream or numbing cream where you have a rash, blisters, or scabs. Do this as told by your doctor. Helping with itching and discomfort  Put cold, wet cloths (cold compresses) on the area of the rash or blisters as told by your doctor. Cool baths can help you feel better. Try adding baking soda or dry oatmeal to the water to lessen itching. Do not bathe in hot water. Use calamine lotion as told by your doctor. Blister and rash care Keep your rash covered with a loose bandage (dressing). Wear loose clothing that does not rub on your rash. Wash your hands with soap and water for at least 20 seconds before and after you change your bandage. If you cannot use soap and water, use hand sanitizer. Change your bandage as told by your doctor. Keep your rash and blisters clean. To do this, wash the area with mild soap and cool water as told by your doctor. Check your rash every day for signs of infection. Check for: More redness, swelling, or pain. Fluid or blood. Warmth. Pus or a bad smell. Do not scratch your rash. Do not pick at your blisters. To  **Note De-identified Ninneman Obfuscation** help you to not scratch: Keep your fingernails clean and cut short. Wear gloves or mittens when you sleep, if scratching is a problem. General instructions Rest as told by your doctor. Wash your hands often with soap and water for at least 20 seconds. If you cannot use soap and water, use hand sanitizer. Doing this lowers your chance of getting a skin infection. Your infection can cause chickenpox in people who have never had chickenpox or never got a  chickenpox vaccine shot. If you have blisters that did not change into scabs yet, try not to touch other people or be around other people, especially: Babies. Pregnant women. Children who have areas of red, itchy, or rough skin (eczema). Older people who have organ transplants. People who have a long-term (chronic) illness, like cancer or AIDS. Keep all follow-up visits. How is this prevented? A vaccine shot is the best way to prevent shingles and protect against shingles problems. If you have not had a vaccine shot, talk with your doctor about getting it. Where to find more information Centers for Disease Control and Prevention: www.cdc.gov Contact a doctor if: Your pain does not get better with medicine. Your pain does not get better after the rash heals. You have any of these signs of infection around the rash: More redness, swelling, or pain. Fluid or blood. Warmth. Pus or a bad smell. You have a fever. Get help right away if: The rash is on your face or nose. You have pain in your face or pain by your eye. You lose feeling on one side of your face. You have trouble seeing. You have ear pain, or you have ringing in your ear. You have a loss of taste. Your condition gets worse. Summary Shingles gives you a painful skin rash and blisters that have fluid in them. Shingles is caused by the same germ (virus) that causes chickenpox. Keep your rash covered with a loose bandage. Wear loose clothing that does not rub on your rash. If you have blisters that did not change into scabs yet, try not to touch other people or be around people. This information is not intended to replace advice given to you by your health care provider. Make sure you discuss any questions you have with your health care provider. Document Revised: 07/15/2020 Document Reviewed: 07/15/2020 Elsevier Patient Education  2023 Else[employer] Inc.  

## 2022-04-09 ENCOUNTER — Telehealth: Payer: Self-pay

## 2022-04-09 LAB — BASIC METABOLIC PANEL
BUN/Creatinine Ratio: 15 (ref 12–28)
BUN: 11 mg/dL (ref 8–27)
CO2: 22 mmol/L (ref 20–29)
Calcium: 9.5 mg/dL (ref 8.7–10.3)
Chloride: 102 mmol/L (ref 96–106)
Creatinine, Ser: 0.72 mg/dL (ref 0.57–1.00)
Glucose: 115 mg/dL — ABNORMAL HIGH (ref 70–99)
Potassium: 3.8 mmol/L (ref 3.5–5.2)
Sodium: 141 mmol/L (ref 134–144)
eGFR: 94 mL/min/{1.73_m2} (ref >59)

## 2022-04-09 NOTE — Telephone Encounter (Signed)
**Note De-Identified Khokhar Obfuscation** Prior authorization was intiated [email or web address]. Awaiting on determination from patient's insurance.  EOC ID: [id number] Member ID: [id number]

## 2022-04-09 NOTE — Progress Notes (Signed)
**Note De-Identified [name] Obfuscation** Contacted [name] afternoon Haley [name], your labs have returned and potassium level has improved and normal range.  Great news!!! Keep being amazing!!  Thank you for allowing me to participate in your care.  I appreciate you. Kindest regards, Treylen Gibbs

## 2022-04-12 NOTE — Patient Instructions (Signed)
**Note De-identified Kingsbury Obfuscation** Shingles  Shingles is an infection. It gives you a painful skin rash and blisters that have fluid in them. Shingles is caused by the same germ (virus) that causes chickenpox. Shingles only happens in people who: Have had chickenpox. Have been given a shot (vaccine) to protect against chickenpox. Shingles is rare in this group. What are the causes? This condition is caused by varicella-zoster virus. This is the same germ that causes chickenpox. After a person is exposed to the germ, the germ stays in the body but is not active (dormant). Shingles develops if the germ becomes active again (is reactivated). This can happen many years after the first exposure to the germ. It is not known what causes this germ to become active again. What increases the risk? People who have had chickenpox or received the chickenpox shot are at risk for shingles. This infection is more common in people who: Are older than 62 years of age. Have a weakened disease-fighting system (immune system), such as people with: HIV (human immunodeficiency virus). AIDS (acquired immunodeficiency syndrome). Cancer. Are taking medicines that weaken the immune system, such as organ transplant medicines. Have a lot of stress. What are the signs or symptoms? The first symptoms of shingles may be itching, tingling, or pain in an area on your skin. A rash will show on your skin a few days or weeks later. This is what usually happens: The rash is likely to be on one side of your body. The rash usually has a shape like a belt or a band. Over time, the rash turns into fluid-filled blisters. The blisters will break open and change into scabs. The scabs usually dry up in about 2-3 weeks. You may also have: A fever. Chills. A headache. A feeling like you may vomit (nausea). How is this treated? The rash may last for several weeks. There is not a specific cure for this condition. Your doctor may prescribe medicines. Medicines  may: Help with pain. Help you get better sooner. Help to prevent long-term problems. Help with itching (antihistamines). If the area involved is on your face, you may need to see a specialist. This may be an eye doctor or an ear, nose, and throat (ENT) doctor. Follow these instructions at home: Medicines Take over-the-counter and prescription medicines only as told by your doctor. Put on an anti-itch cream or numbing cream where you have a rash, blisters, or scabs. Do this as told by your doctor. Helping with itching and discomfort  Put cold, wet cloths (cold compresses) on the area of the rash or blisters as told by your doctor. Cool baths can help you feel better. Try adding baking soda or dry oatmeal to the water to lessen itching. Do not bathe in hot water. Use calamine lotion as told by your doctor. Blister and rash care Keep your rash covered with a loose bandage (dressing). Wear loose clothing that does not rub on your rash. Wash your hands with soap and water for at least 20 seconds before and after you change your bandage. If you cannot use soap and water, use hand sanitizer. Change your bandage as told by your doctor. Keep your rash and blisters clean. To do this, wash the area with mild soap and cool water as told by your doctor. Check your rash every day for signs of infection. Check for: More redness, swelling, or pain. Fluid or blood. Warmth. Pus or a bad smell. Do not scratch your rash. Do not pick at your blisters. To  **Note De-identified Klecka Obfuscation** help you to not scratch: Keep your fingernails clean and cut short. Wear gloves or mittens when you sleep, if scratching is a problem. General instructions Rest as told by your doctor. Wash your hands often with soap and water for at least 20 seconds. If you cannot use soap and water, use hand sanitizer. Doing this lowers your chance of getting a skin infection. Your infection can cause chickenpox in people who have never had chickenpox or never got a  chickenpox vaccine shot. If you have blisters that did not change into scabs yet, try not to touch other people or be around other people, especially: Babies. Pregnant women. Children who have areas of red, itchy, or rough skin (eczema). Older people who have organ transplants. People who have a long-term (chronic) illness, like cancer or AIDS. Keep all follow-up visits. How is this prevented? A vaccine shot is the best way to prevent shingles and protect against shingles problems. If you have not had a vaccine shot, talk with your doctor about getting it. Where to find more information Centers for Disease Control and Prevention: www.cdc.gov Contact a doctor if: Your pain does not get better with medicine. Your pain does not get better after the rash heals. You have any of these signs of infection around the rash: More redness, swelling, or pain. Fluid or blood. Warmth. Pus or a bad smell. You have a fever. Get help right away if: The rash is on your face or nose. You have pain in your face or pain by your eye. You lose feeling on one side of your face. You have trouble seeing. You have ear pain, or you have ringing in your ear. You have a loss of taste. Your condition gets worse. Summary Shingles gives you a painful skin rash and blisters that have fluid in them. Shingles is caused by the same germ (virus) that causes chickenpox. Keep your rash covered with a loose bandage. Wear loose clothing that does not rub on your rash. If you have blisters that did not change into scabs yet, try not to touch other people or be around people. This information is not intended to replace advice given to you by your health care provider. Make sure you discuss any questions you have with your health care provider. Document Revised: 07/15/2020 Document Reviewed: 07/15/2020 Elsevier Patient Education  2023 Elsevier Inc.  

## 2022-04-15 ENCOUNTER — Encounter: Payer: Self-pay | Admitting: Nurse Practitioner

## 2022-04-15 ENCOUNTER — Ambulatory Visit (INDEPENDENT_AMBULATORY_CARE_PROVIDER_SITE_OTHER): Payer: 59 | Admitting: Nurse Practitioner

## 2022-04-15 DIAGNOSIS — B029 Zoster without complications: Secondary | ICD-10-CM

## 2022-04-15 NOTE — Assessment & Plan Note (Signed)
**Note De-Identified College Obfuscation** Acute and improving.  Completed Valtrex and overall healing.  Recommend she continue Hydrocortisone cream, Lidocaine cream, or Voltaren gel at home as needed.  Tylenol for pain. Wash clothes well and avoid contact with rash areas, if contact wash hands well afterwards.  Shingrix in a couple months.

## 2022-04-15 NOTE — Progress Notes (Signed)
**Note De-Identified Sailer Obfuscation** BP 121/79   Pulse 70   Temp 98.3 F (36.8 C) (Oral)   Ht 5' 0.39" (1.534 m)   Wt 98 lb 12.8 oz (44.8 kg)   SpO2 98%   BMI 19.05 kg/m    Subjective:    Patient ID: Haley [name], female    DOB: March 06, 1961, 61 y.o.   MRN: [id number]  HPI: Haley [name] is a 61 y.o. female  Chief Complaint  Patient presents with   Herpes Zoster    Patient is here for a one week follow up on Shingles. Patient says there has been no changes since she was last seen. Patient says she is still having pain.    SHINGLES Diagnosed on 04/08/22 with shingles to anterior right breast and around back.  Treated with Valtrex.  Is using Tylenol, muscle relaxer, and Hydrocortisone cream for discomfort.  She is using Lidocaine cream her husband bought her.  Duration: week Location: as above Painful:  yes Severity: 6/10 to 8/10 not constant Paresthesia:  yes Hyperesthesia: no Itching:  no Burning:  yes Oozing:  no Blisters:  no Fevers:  no History of the same:  no Alleviating factors: as above Status: stable Treatments attempted: as above  Relevant past medical, surgical, family and social history reviewed and updated as indicated. Interim medical history since our last visit reviewed. Allergies and medications reviewed and updated.  Review of Systems  Constitutional:  Negative for activity change, appetite change, diaphoresis, fatigue and fever.  Respiratory:  Negative for cough, chest tightness and shortness of breath.   Cardiovascular:  Negative for chest pain, palpitations and leg swelling.  Gastrointestinal: Negative.   Skin:  Positive for rash.  Neurological: Negative.   Psychiatric/Behavioral: Negative.      Per HPI unless specifically indicated above     Objective:    BP 121/79   Pulse 70   Temp 98.3 F (36.8 C) (Oral)   Ht 5' 0.39" (1.534 m)   Wt 98 lb 12.8 oz (44.8 kg)   SpO2 98%   BMI 19.05 kg/m   Wt Readings from Last 3 Encounters:  04/15/22 98 lb 12.8 oz (44.8 kg)  04/08/22  100 lb (45.4 kg)  03/27/22 99 lb 9.6 oz (45.2 kg)    Physical Exam Vitals and nursing note reviewed.  Constitutional:      General: She is awake. She is not in acute distress.    Appearance: She is well-developed. She is not ill-appearing or toxic-appearing.  HENT:     Head: Normocephalic.     Right Ear: Hearing normal.     Left Ear: Hearing normal.     Nose: Nose normal.     Mouth/Throat:     Dentition: Abnormal dentition.  Eyes:     General: Lids are normal.        Right eye: No discharge.        Left eye: No discharge.     Conjunctiva/sclera: Conjunctivae normal.     Pupils: Pupils are equal, round, and reactive to light.  Neck:     Thyroid: No thyromegaly.     Vascular: No carotid bruit or JVD.  Cardiovascular:     Rate and Rhythm: Normal rate and regular rhythm.     Heart sounds: Normal heart sounds. No murmur heard.    No gallop.  Pulmonary:     Effort: Pulmonary effort is normal. No accessory muscle usage or respiratory distress.     Breath sounds: Normal breath sounds.  Abdominal: **Note De-Identified Cronkright Obfuscation** General: Bowel sounds are normal. There is no distension.     Palpations: Abdomen is soft.     Tenderness: There is no abdominal tenderness.  Musculoskeletal:     Cervical back: Normal range of motion and neck supple.     Right lower leg: No edema.     Left lower leg: No edema.  Lymphadenopathy:     Head:     Right side of head: No submental, submandibular, tonsillar, preauricular or posterior auricular adenopathy.     Left side of head: No submental, submandibular, tonsillar, preauricular or posterior auricular adenopathy.     Cervical: No cervical adenopathy.  Skin:    General: Skin is warm and dry.     Findings: Rash present. Rash is vesicular.     Comments: Cluster like pattern rash to right upper thoracic area, area with crusting and overall healing.  Areas of small clusters present around right nipple and breast, healing with crusting present.  Neurological:     Mental  Status: She is alert and oriented to person, place, and time.     Deep Tendon Reflexes: Reflexes are normal and symmetric.     Reflex Scores:      Brachioradialis reflexes are 2+ on the right side and 2+ on the left side.      Patellar reflexes are 2+ on the right side and 2+ on the left side. Psychiatric:        Attention and Perception: Attention normal.        Mood and Affect: Mood normal.        Speech: Speech normal.        Behavior: Behavior normal. Behavior is cooperative.        Thought Content: Thought content normal.     Results for orders placed or performed in visit on 55/37/48  Basic Metabolic Panel (BMET)  Result Value Ref Range   Glucose 115 (H) 70 - 99 mg/dL   BUN 11 8 - 27 mg/dL   Creatinine, Ser 0.72 0.57 - 1.00 mg/dL   eGFR 94 >59 mL/min/1.73   BUN/Creatinine Ratio 15 12 - 28   Sodium 141 134 - 144 mmol/L   Potassium 3.8 3.5 - 5.2 mmol/L   Chloride 102 96 - 106 mmol/L   CO2 22 20 - 29 mmol/L   Calcium 9.5 8.7 - 10.3 mg/dL      Assessment & Plan:   Problem List Items Addressed This Visit       Other   Shingles    Acute and improving.  Completed Valtrex and overall healing.  Recommend she continue Hydrocortisone cream, Lidocaine cream, or Voltaren gel at home as needed.  Tylenol for pain. Wash clothes well and avoid contact with rash areas, if contact wash hands well afterwards.  Shingrix in a couple months.        Follow up plan: Return if symptoms worsen or fail to improve.

## 2022-05-05 ENCOUNTER — Telehealth: Payer: Self-pay

## 2022-05-05 NOTE — Telephone Encounter (Signed)
**Note De-Identified [place] Obfuscation** Left message on [facility] for patient to call to schedule annual LDCT

## 2022-05-22 ENCOUNTER — Ambulatory Visit: Payer: PRIVATE HEALTH INSURANCE | Admitting: Nurse Practitioner

## 2022-05-22 DIAGNOSIS — E782 Mixed hyperlipidemia: Secondary | ICD-10-CM

## 2022-10-03 NOTE — Patient Instructions (Signed)
**Note De-identified [place] Obfuscation** Preventing High Cholesterol Cholesterol is a white, waxy substance similar to fat that the human body needs to help build cells. The liver makes all the cholesterol that a person's body needs. Having high cholesterol (hypercholesterolemia) increases your risk for heart disease and stroke. Extra or excess cholesterol comes from the food that you eat. High cholesterol can often be prevented with diet and lifestyle changes. If you already have high cholesterol, you can control it with diet, lifestyle changes, and medicines. How can high cholesterol affect me? If you have high cholesterol, fatty deposits (plaques) may build up on the walls of your blood vessels. The blood vessels that carry blood away from your heart are called arteries. Plaques make the arteries narrower and stiffer. This in turn can: Restrict or block blood flow and cause blood clots to form. Increase your risk for heart attack and stroke. What can increase my risk for high cholesterol? This condition is more likely to develop in people who: Eat foods that are high in saturated fat or cholesterol. Saturated fat is mostly found in foods that come from animal sources. Are overweight. Are not getting enough exercise. Use products that contain nicotine or tobacco, such as cigarettes, e-cigarettes, and chewing tobacco. Have a family history of high cholesterol (familial hypercholesterolemia). What actions can I take to prevent this? Nutrition  Eat less saturated fat. Avoid trans fats (partially hydrogenated oils). These are often found in margarine and in some baked goods, fried foods, and snacks bought in packages. Avoid precooked or cured meat, such as bacon, sausages, or meat loaves. Avoid foods and drinks that have added sugars. Eat more fruits, vegetables, and whole grains. Choose healthy sources of protein, such as fish, poultry, lean cuts of red meat, beans, peas, lentils, and nuts. Choose healthy sources of fat, such  as: Nuts. Vegetable oils, especially olive oil. Fish that have healthy fats, such as omega-3 fatty acids. These fish include mackerel or salmon. Lifestyle Lose weight if you are overweight. Maintaining a healthy body mass index (BMI) can help prevent or control high cholesterol. It can also lower your risk for diabetes and high blood pressure. Ask your health care provider to help you with a diet and exercise plan to lose weight safely. Do not use any products that contain nicotine or tobacco. These products include cigarettes, chewing tobacco, and vaping devices, such as e-cigarettes. If you need help quitting, ask your health care provider. Alcohol use Do not drink alcohol if: Your health care provider tells you not to drink. You are pregnant, may be pregnant, or are planning to become pregnant. If you drink alcohol: Limit how much you have to: 0-1 drink a day for women. 0-2 drinks a day for men. Know how much alcohol is in your drink. In the [place] one drink equals one 12 oz bottle of beer (355 mL), one 5 oz glass of wine (148 mL), or one 1 oz glass of hard liquor (44 mL). Activity  Get enough exercise. Do exercises as told by your health care provider. Each week, do at least 150 minutes of exercise that takes a medium level of effort (moderate-intensity exercise). This kind of exercise: Makes your heart beat faster while allowing you to still be able to talk. Can be done in short sessions several times a day or longer sessions a few times a week. For example, on 5 days each week, you could walk fast or ride your bike 3 times a day for 10 minutes each time. Medicines Your  **Note De-identified Hotard Obfuscation** health care provider may recommend medicines to help lower cholesterol. This may be a medicine to lower the amount of cholesterol that your liver makes. You may need medicine if: Diet and lifestyle changes have not lowered your cholesterol enough. You have high cholesterol and other risk factors for heart disease or  stroke. Take over-the-counter and prescription medicines only as told by your health care provider. General information Manage your risk factors for high cholesterol. Talk with your health care provider about all your risk factors and how to lower your risk. Manage other conditions that you have, such as diabetes or high blood pressure (hypertension). Have blood tests to check your cholesterol levels at regular points in time as told by your health care provider. Keep all follow-up visits. This is important. Where to find more information American Heart Association: www.heart.org National Heart, Lung, and Blood Institute: www.nhlbi.nih.gov Summary High cholesterol increases your risk for heart disease and stroke. By keeping your cholesterol level low, you can reduce your risk for these conditions. High cholesterol can often be prevented with diet and lifestyle changes. Work with your health care provider to manage your risk factors, and have your blood tested regularly. This information is not intended to replace advice given to you by your health care provider. Make sure you discuss any questions you have with your health care provider. Document Revised: 02/20/2022 Document Reviewed: 09/23/2020 Elsevier Patient Education  2023 [employer] Inc.  

## 2022-10-05 ENCOUNTER — Encounter: Payer: Self-pay | Admitting: Nurse Practitioner

## 2022-10-05 ENCOUNTER — Ambulatory Visit (INDEPENDENT_AMBULATORY_CARE_PROVIDER_SITE_OTHER): Payer: Managed Care, Other (non HMO) | Admitting: Nurse Practitioner

## 2022-10-05 VITALS — BP 130/86 | HR 83 | Temp 98.1°F | Ht 60.39 in | Wt 98.9 lb

## 2022-10-05 DIAGNOSIS — F411 Generalized anxiety disorder: Secondary | ICD-10-CM

## 2022-10-05 DIAGNOSIS — J432 Centrilobular emphysema: Secondary | ICD-10-CM | POA: Diagnosis not present

## 2022-10-05 DIAGNOSIS — G8929 Other chronic pain: Secondary | ICD-10-CM

## 2022-10-05 DIAGNOSIS — K219 Gastro-esophageal reflux disease without esophagitis: Secondary | ICD-10-CM

## 2022-10-05 DIAGNOSIS — F321 Major depressive disorder, single episode, moderate: Secondary | ICD-10-CM | POA: Diagnosis not present

## 2022-10-05 DIAGNOSIS — R1084 Generalized abdominal pain: Secondary | ICD-10-CM | POA: Insufficient documentation

## 2022-10-05 DIAGNOSIS — E782 Mixed hyperlipidemia: Secondary | ICD-10-CM

## 2022-10-05 DIAGNOSIS — M546 Pain in thoracic spine: Secondary | ICD-10-CM | POA: Insufficient documentation

## 2022-10-05 DIAGNOSIS — F5101 Primary insomnia: Secondary | ICD-10-CM

## 2022-10-05 DIAGNOSIS — F17210 Nicotine dependence, cigarettes, uncomplicated: Secondary | ICD-10-CM

## 2022-10-05 DIAGNOSIS — E559 Vitamin D deficiency, unspecified: Secondary | ICD-10-CM

## 2022-10-05 DIAGNOSIS — K580 Irritable bowel syndrome with diarrhea: Secondary | ICD-10-CM

## 2022-10-05 DIAGNOSIS — I700 Atherosclerosis of aorta: Secondary | ICD-10-CM | POA: Diagnosis not present

## 2022-10-05 LAB — URINALYSIS, ROUTINE W REFLEX MICROSCOPIC
Bilirubin, UA: NEGATIVE
Glucose, UA: NEGATIVE
Ketones, UA: NEGATIVE
Leukocytes,UA: NEGATIVE
Nitrite, UA: NEGATIVE
Protein,UA: NEGATIVE
Specific Gravity, UA: 1.01 (ref 1.005–1.030)
Urobilinogen, Ur: 0.2 mg/dL (ref 0.2–1.0)
pH, UA: 7 (ref 5.0–7.5)

## 2022-10-05 LAB — MICROSCOPIC EXAMINATION: Bacteria, UA: NONE SEEN

## 2022-10-05 LAB — WET PREP FOR TRICH, YEAST, CLUE
Clue Cell Exam: POSITIVE — AB
Trichomonas Exam: NEGATIVE
Yeast Exam: NEGATIVE

## 2022-10-05 MED ORDER — METRONIDAZOLE 500 MG PO TABS: 500.0000 mg | ORAL_TABLET | Freq: Two times a day (BID) | ORAL | 0 refills | Status: AC

## 2022-10-05 MED ORDER — FLUOXETINE HCL 20 MG PO TABS: 20.0000 mg | ORAL_TABLET | Freq: Every day | ORAL | 4 refills | Status: DC

## 2022-10-05 MED ORDER — ATORVASTATIN CALCIUM 10 MG PO TABS: 10.0000 mg | ORAL_TABLET | Freq: Every day | ORAL | 5 refills | Status: DC

## 2022-10-05 NOTE — Assessment & Plan Note (Signed)
**Note De-Identified S[place] Obfuscation** Chronic issue with her IBS -- however will check urine and wet prep today due to back pain present too and check labs.  Plan on return in 6 weeks.

## 2022-10-05 NOTE — Assessment & Plan Note (Signed)
**Note De-Identified Lemler Obfuscation** Chronic, ongoing.  Continue Tizanidine as needed.  Check labs today.

## 2022-10-05 NOTE — Addendum Note (Signed)
**Note De-Identified Calandro Obfuscation** Addended by: Marnee Guarneri T on: 10/05/2022 05:08 PM   Modules accepted: Orders

## 2022-10-05 NOTE — Assessment & Plan Note (Signed)
**Note De-Identified Davis Obfuscation** Chronic, ongoing.  Continue Prilosec BID dosing.  Recommend heavy focus on diet and keeping food journal, eliminating foods that cause discomfort.  Mag level annually.  Discussed the risks and benefits of long term PPI use including but not limited to bone loss, chronic kidney disease, infections, low magnesium.  Will aim to use at the lowest dose for the shortest period of time.

## 2022-10-05 NOTE — Progress Notes (Signed)
**Note De-Identified Verdi Obfuscation** Contacted Marcucci P[place] -- but please call to ensure she receive below message:  Good evening Haley [name], your urine returned and overall is stable with exception of a little blood noted.  Your wet prep did show some clue cells, meaning a bacterial vaginal infection -- this is not sexually transmitted.  It is a build up of bad bacteria in the vagina that can caused pain in belly and back.  I am sending in Flagyl for you to start taking for this. Any questions? Keep being awesome!!  Thank you for allowing me to participate in your care.  I appreciate you. Kindest regards, Frimy Uffelman

## 2022-10-05 NOTE — Assessment & Plan Note (Signed)
**Note De-Identified Alia Obfuscation** Chronic, ongoing issue with continue symptoms.  Referral to GI as needed.  Consider Amitriptyline which may benefit mood and GI symptoms in future.

## 2022-10-05 NOTE — Assessment & Plan Note (Signed)
**Note De-Identified Bon[place]ek Obfuscation** Chronic, ongoing.  She agrees with statin initiation today, educated her at length on medication and risk/benefit + side effects.  Start Atorvastatin 10 MG daily, did not tolerate Crestor.

## 2022-10-05 NOTE — Assessment & Plan Note (Signed)
**Note De-Identified Eisner Obfuscation** Ongoing.  Noted on lung screening in July 2021.  Recommend complete cessation of smoking.  She agrees with new statin trial today, will trial Atorvastatin 10 MG daily -- educated her on this medication and use + side effects to report.  She did not tolerate Crestor.

## 2022-10-05 NOTE — Progress Notes (Signed)
**Note De-Identified [place] Obfuscation** BP 130/86   Pulse 83   Temp 98.1 F (36.7 C) (Oral)   Ht 5' 0.39" (1.534 m)   Wt 98 lb 14.4 oz (44.9 kg)   SpO2 98%   BMI 19.06 kg/m    Subjective:    Patient ID: Haley [name], female    DOB: 28-Aug-1960, 62 y.o.   MRN: [id number]  HPI: Fox Dally Melman is a 62 y.o. female  Chief Complaint  Patient presents with   Back Pain    For past few months   Abx pain    LLQ   COPD Continues on Albuterol as needed.  Continues to smoke 1 PPD, has smoked since age 32.  Has quit for periods of time, but more actively smoked then not.  Last lung screening 02/08/2020.   Mild centrilobular and paraseptal emphysema + aortic atherosclerosis noted.  Takes Allegra and Flonase + Singulair.  Continues Vit D supplement daily for osteopenia. COPD status: stable Satisfied with current treatment?: yes Oxygen use: no Dyspnea frequency: no Cough frequency: no Rescue inhaler frequency:  rarely Limitation of activity: no Productive cough: none Last Spirometry: 06/16/2016 Pneumovax: Up to Date Influenza: Not up to Date   HYPERLIPIDEMIA Took Crestor but this caused nausea. Has not tried other statins. Hyperlipidemia status: poor compliance Satisfied with current treatment?  no Side effects:  yes Medication compliance: poor compliance:  Supplements: none Aspirin:  no The 10-year ASCVD risk score (Arnett DK, et al., 2019) is: 10.9%   Values used to calculate the score:     Age: 28 years     Sex: Female     Is Non-Hispanic African American: No     Diabetic: No     Tobacco smoker: Yes     Systolic Blood Pressure: [id number]     Is BP treated: No     HDL Cholesterol: 48 mg/dL     Total Cholesterol: 248 mg/dL Chest pain:  no Coronary artery disease:  yes Family history CAD:  yes Family history early CAD:  no   CHRONIC ABDOMINAL PAIN & BACK PAIN  Has chronic abdominal pain and for several months has had back pain to mid-back and shoulders, moves her mother a lot in bed.  Tizanidine helps her back  pain.  Omeprazole taken daily. Duration:months Onset: gradual Severity: back 6/10 and abdominal pain varies Quality: dull and aching Location:  diffuse  Episode duration:  Radiation: no Frequency: intermittent Alleviating factors: Tizanidine Aggravating factors: moving her mother Status: stable Treatments attempted: PPI Fever: no Nausea: yes Vomiting: no Weight loss: no Decreased appetite: no Diarrhea: yes at baseline Constipation: no Blood in stool: no Heartburn:  occasional Jaundice: no Rash: no Dysuria/urinary frequency: no Hematuria: no History of sexually transmitted disease: no Recurrent NSAID use: no   DEPRESSION Has been off of Sertraline for two weeks.  Her sister was recently changed to generic Prozac which works well.  Reports she is having stressors with her sister, as Analena takes care of their mom full-time due to dementia = her sister does not help with caring.   She does takes Trazodone for sleep, which helps her rest.   Mood status: exacerbated Satisfied with current treatment?: no Symptom severity: moderate  Duration of current treatment : chronic Side effects: no Medication compliance: poor compliance Psychotherapy/counseling: none Depressed mood: yes Anxious mood: yes Anhedonia: no Significant weight loss or gain: no Insomnia: yes hard to fall asleep Fatigue: yes Feelings of worthlessness or guilt: no Impaired concentration/indecisiveness: no Suicidal ideations: **Note De-Identified [place] Obfuscation** no Hopelessness: no Crying spells: no    10/05/2022    3:08 PM 04/15/2022    3:19 PM 04/08/2022    3:31 PM 03/27/2022    3:58 PM 10/24/2021    1:56 PM  Depression screen PHQ 2/9  Decreased Interest 1 0 '2 2 1  '$ Down, Depressed, Hopeless 1 0 0 1 0  PHQ - 2 Score 2 0 '2 3 1  '$ Altered sleeping '2 1 2 3 3  '$ Tired, decreased energy '3 1 2 3 2  '$ Change in appetite 2 0 0 3 0  Feeling bad or failure about yourself  0 0 0 0 0  Trouble concentrating 0 0 0 0 0  Moving slowly or fidgety/restless 0 0  0 0 0  Suicidal thoughts 0 0 0 0 0  PHQ-9 Score '9 2 6 12 6  '$ Difficult doing work/chores Somewhat difficult Not difficult at all Not difficult at all Somewhat difficult Not difficult at all       10/05/2022    3:08 PM 04/15/2022    3:20 PM 04/08/2022    3:31 PM 10/24/2021    1:57 PM  GAD 7 : Generalized Anxiety Score  Nervous, Anxious, on Edge '2 1 1 2  '$ Control/stop worrying 1 0 0 0  Worry too much - different things 2 0 0 2  Trouble relaxing 2 0 1 2  Restless 1 0 0 0  Easily annoyed or irritable 2 0 1 2  Afraid - awful might happen 1 0 1 0  Total GAD 7 Score '11 1 4 8  '$ Anxiety Difficulty Somewhat difficult Not difficult at all Not difficult at all Not difficult at all      Relevant past medical, surgical, family and social history reviewed and updated as indicated. Interim medical history since our last visit reviewed. Allergies and medications reviewed and updated.  Review of Systems  Constitutional:  Negative for activity change, appetite change, diaphoresis, fatigue and fever.  Respiratory:  Negative for cough, chest tightness and shortness of breath.   Cardiovascular:  Negative for chest pain, palpitations and leg swelling.  Gastrointestinal:  Positive for abdominal pain (chronic) and diarrhea. Negative for abdominal distention, constipation, nausea and vomiting.  Musculoskeletal:  Positive for back pain.  Neurological: Negative.   Psychiatric/Behavioral:  Negative for decreased concentration, self-injury, sleep disturbance and suicidal ideas. The patient is not nervous/anxious.     Per HPI unless specifically indicated above     Objective:    BP 130/86   Pulse 83   Temp 98.1 F (36.7 C) (Oral)   Ht 5' 0.39" (1.534 m)   Wt 98 lb 14.4 oz (44.9 kg)   SpO2 98%   BMI 19.06 kg/m   Wt Readings from Last 3 Encounters:  10/05/22 98 lb 14.4 oz (44.9 kg)  04/15/22 98 lb 12.8 oz (44.8 kg)  04/08/22 100 lb (45.4 kg)    Physical Exam Vitals and nursing note reviewed.   Constitutional:      General: She is awake. She is not in acute distress.    Appearance: She is well-developed. She is not ill-appearing or toxic-appearing.  HENT:     Head: Normocephalic.     Right Ear: Hearing normal.     Left Ear: Hearing normal.     Nose: Nose normal.     Mouth/Throat:     Dentition: Abnormal dentition.  Eyes:     General: Lids are normal.        Right eye: No discharge. **Note De-Identified [place] Obfuscation** Left eye: No discharge.     Conjunctiva/sclera: Conjunctivae normal.     Pupils: Pupils are equal, round, and reactive to light.  Neck:     Thyroid: No thyromegaly.     Vascular: No carotid bruit or JVD.  Cardiovascular:     Rate and Rhythm: Normal rate and regular rhythm.     Heart sounds: Normal heart sounds. No murmur heard.    No gallop.  Pulmonary:     Effort: Pulmonary effort is normal. No accessory muscle usage or respiratory distress.     Breath sounds: Normal breath sounds.  Abdominal:     General: Bowel sounds are normal. There is no distension.     Palpations: Abdomen is soft.     Tenderness: There is no abdominal tenderness. There is no right CVA tenderness or left CVA tenderness.  Musculoskeletal:     Cervical back: Normal, normal range of motion and neck supple.     Thoracic back: Normal.     Right lower leg: No edema.     Left lower leg: No edema.  Lymphadenopathy:     Head:     Right side of head: No submental, submandibular, tonsillar, preauricular or posterior auricular adenopathy.     Left side of head: No submental, submandibular, tonsillar, preauricular or posterior auricular adenopathy.     Cervical: No cervical adenopathy.  Skin:    General: Skin is warm and dry.  Neurological:     Mental Status: She is alert and oriented to person, place, and time.     Deep Tendon Reflexes: Reflexes are normal and symmetric.     Reflex Scores:      Brachioradialis reflexes are 2+ on the right side and 2+ on the left side.      Patellar reflexes are 2+ on the right  side and 2+ on the left side. Psychiatric:        Attention and Perception: Attention normal.        Mood and Affect: Mood normal.        Speech: Speech normal.        Behavior: Behavior normal. Behavior is cooperative.        Thought Content: Thought content normal.     Results for orders placed or performed in visit on [id number]  Basic Metabolic Panel (BMET)  Result Value Ref Range   Glucose 115 (H) 70 - 99 mg/dL   BUN 11 8 - 27 mg/dL   Creatinine, Ser 0.72 0.57 - 1.00 mg/dL   eGFR 94 >59 mL/min/1.73   BUN/Creatinine Ratio 15 12 - 28   Sodium 141 134 - 144 mmol/L   Potassium 3.8 3.5 - 5.2 mmol/L   Chloride 102 96 - 106 mmol/L   CO2 22 20 - 29 mmol/L   Calcium 9.5 8.7 - 10.3 mg/dL      Assessment & Plan:   Problem List Items Addressed This Visit       Cardiovascular and Mediastinum   Aortic atherosclerosis ([place])    Ongoing.  Noted on lung screening in July 2021.  Recommend complete cessation of smoking.  She agrees with new statin trial today, will trial Atorvastatin 10 MG daily -- educated her on this medication and use + side effects to report.  She did not tolerate Crestor.      Relevant Medications   atorvastatin (LIPITOR) 10 MG tablet   Other Relevant Orders   Comprehensive metabolic panel   Lipid Panel w/o Chol/HDL Ratio **Note De-Identified [place] Obfuscation** Respiratory   Centrilobular emphysema (HCC) - Primary    Chronic, ongoing. Continue Albuterol inhaler PRN and Singulair daily.  Adjust inhaler regimen as needed, consider maintenance if worsening.  Consider spirometry at next visit.  No recent exacerbations.  New referral for lung screening.      Relevant Orders   CBC with Differential/Platelet   Ambulatory Referral Lung Cancer Screening [facility]     Digestive   GERD (gastroesophageal reflux disease)    Chronic, ongoing.  Continue Prilosec BID dosing.  Recommend heavy focus on diet and keeping food journal, eliminating foods that cause discomfort.  Mag level annually.  Discussed  the risks and benefits of long term PPI use including but not limited to bone loss, chronic kidney disease, infections, low magnesium.  Will aim to use at the lowest dose for the shortest period of time.        Relevant Orders   Magnesium   IBS (irritable bowel syndrome)    Chronic, ongoing issue with continue symptoms.  Referral to GI as needed.  Consider Amitriptyline which may benefit mood and GI symptoms in future.        Other   Chronic thoracic back pain    Chronic, ongoing.  Continue Tizanidine as needed.  Check labs today.      Relevant Medications   FLUoxetine (PROZAC) 20 MG tablet   Cigarette nicotine dependence without complication    I have recommended complete cessation of tobacco use. I have discussed various options available for assistance with tobacco cessation including over the counter methods (Nicotine gum, patch and lozenges). We also discussed prescription options (Chantix, Nicotine Inhaler / Nasal Spray). The patient is not interested in pursuing any prescription tobacco cessation options at this time. Continue yearly lung CT screening -- referral placed.       Relevant Orders   Ambulatory Referral Lung Cancer Screening [facility]   Depression, major, single episode, moderate (HCC)    Chronic, ongoing.  Denies SI/HI.  She self stopped Sertraline as no benefit.  Prozac start 20 MG daily and assess benefit.  Follow-up in 6 months.      Relevant Medications   FLUoxetine (PROZAC) 20 MG tablet   Other Relevant Orders   TSH   Generalized abdominal pain    Chronic issue with her IBS -- however will check urine and wet prep today due to back pain present too and check labs.  Plan on return in 6 weeks.      Relevant Orders   WET PREP FOR TRICH, YEAST, CLUE   Urinalysis, Routine w reflex microscopic   Generalized anxiety disorder    Chronic, ongoing.  Denies SI/HI.  Self stopped Sertraline as offering no benefit -- start Prozac 20 MG daily and assess  benefit.  This benefits both anxiety and depression elements. Return to office in 6 months.      Relevant Medications   FLUoxetine (PROZAC) 20 MG tablet   Insomnia    Chronic, ongoing.  Will continue Trazodone nightly, which offers benefit.   ?Maintain a regular sleep schedule, particularly a regular wake-up time in the morning ?Try not to force sleep ?Avoid caffeinated beverages after lunch ?Avoid alcohol near bedtime (eg, late afternoon and evening)  ?Avoid smoking or other nicotine intake, particularly during the evening ?Adjust the bedroom environment as needed to decrease stimuli (eg, reduce ambient light, turn off the television or radio) ?Avoid prolonged use of light-emitting screens (laptops, tablets, smartphones, ebooks) before bedtime  ?Resolve concerns or worries **Note De-Identified [place] Obfuscation** before bedtime ?Exercise regularly for at least 20 minutes, preferably more than four to five hours prior to bedtime  ?Avoid daytime naps, especially if they are longer than 20 to 30 minutes or occur late in the day       Mixed hyperlipidemia    Chronic, ongoing.  She agrees with statin initiation today, educated her at length on medication and risk/benefit + side effects.  Start Atorvastatin 10 MG daily, did not tolerate Crestor.      Relevant Medications   atorvastatin (LIPITOR) 10 MG tablet   Other Relevant Orders   Comprehensive metabolic panel   Lipid Panel w/o Chol/HDL Ratio   Vitamin D deficiency disease    Chronic, ongoing.  Continue daily supplement and recheck Vit D level today.  Adjust as needed.  Next DEXA at age 44.      Relevant Orders   VITAMIN D 25 Hydroxy (Vit-D Deficiency, Fractures)     Follow up plan: Return in about 6 weeks (around 11/16/2022) for DEPRESSION.

## 2022-10-05 NOTE — Assessment & Plan Note (Signed)
**Note De-Identified [place] Obfuscation** Chronic, ongoing.  Denies SI/HI.  She self stopped Sertraline as no benefit.  Prozac start 20 MG daily and assess benefit.  Follow-up in 6 months.

## 2022-10-05 NOTE — Assessment & Plan Note (Signed)
**Note De-Identified [place] Obfuscation** Chronic, ongoing.  Continue daily supplement and recheck Vit D level today.  Adjust as needed.  Next DEXA at age 62.

## 2022-10-05 NOTE — Assessment & Plan Note (Signed)
**Note De-Identified [place] Obfuscation** I have recommended complete cessation of tobacco use. I have discussed various options available for assistance with tobacco cessation including over the counter methods (Nicotine gum, patch and lozenges). We also discussed prescription options (Chantix, Nicotine Inhaler / Nasal Spray). The patient is not interested in pursuing any prescription tobacco cessation options at this time. Continue yearly lung CT screening -- referral placed.

## 2022-10-05 NOTE — Assessment & Plan Note (Signed)
**Note De-Identified [place] Obfuscation** Chronic, ongoing. Continue Albuterol inhaler PRN and Singulair daily.  Adjust inhaler regimen as needed, consider maintenance if worsening.  Consider spirometry at next visit.  No recent exacerbations.  New referral for lung screening.

## 2022-10-05 NOTE — Assessment & Plan Note (Signed)
**Note De-Identified [place] Obfuscation** Chronic, ongoing.  Will continue Trazodone nightly, which offers benefit.   ?Maintain a regular sleep schedule, particularly a regular wake-up time in the morning ?Try not to force sleep ?Avoid caffeinated beverages after lunch ?Avoid alcohol near bedtime (eg, late afternoon and evening)  ?Avoid smoking or other nicotine intake, particularly during the evening ?Adjust the bedroom environment as needed to decrease stimuli (eg, reduce ambient light, turn off the television or radio) ?Avoid prolonged use of light-emitting screens (laptops, tablets, smartphones, ebooks) before bedtime  ?Resolve concerns or worries before bedtime ?Exercise regularly for at least 20 minutes, preferably more than four to five hours prior to bedtime  ?Avoid daytime naps, especially if they are longer than 20 to 30 minutes or occur late in the day

## 2022-10-05 NOTE — Assessment & Plan Note (Signed)
**Note De-Identified [place] Obfuscation** Chronic, ongoing.  Denies SI/HI.  Self stopped Sertraline as offering no benefit -- start Prozac 20 MG daily and assess benefit.  This benefits both anxiety and depression elements. Return to office in 6 months.

## 2022-10-06 LAB — CBC WITH DIFFERENTIAL/PLATELET
Basophils Absolute: 0.1 10*3/uL (ref 0.0–0.2)
Basos: 1 %
EOS (ABSOLUTE): 0.1 10*3/uL (ref 0.0–0.4)
Eos: 2 %
Hematocrit: 46.3 % (ref 34.0–46.6)
Hemoglobin: 15.1 g/dL (ref 11.1–15.9)
Immature Grans (Abs): 0 10*3/uL (ref 0.0–0.1)
Immature Granulocytes: 0 %
Lymphocytes Absolute: 1.6 10*3/uL (ref 0.7–3.1)
Lymphs: 20 %
MCH: 31.3 pg (ref 26.6–33.0)
MCHC: 32.6 g/dL (ref 31.5–35.7)
MCV: 96 fL (ref 79–97)
Monocytes Absolute: 0.4 10*3/uL (ref 0.1–0.9)
Monocytes: 5 %
Neutrophils Absolute: 5.7 10*3/uL (ref 1.4–7.0)
Neutrophils: 72 %
Platelets: 364 10*3/uL (ref 150–450)
RBC: 4.83 x10E6/uL (ref 3.77–5.28)
RDW: 13.6 % (ref 11.7–15.4)
WBC: 7.9 10*3/uL (ref 3.4–10.8)

## 2022-10-06 LAB — TSH: TSH: 1.9 u[IU]/mL (ref 0.450–4.500)

## 2022-10-06 LAB — COMPREHENSIVE METABOLIC PANEL
ALT: 16 IU/L (ref 0–32)
AST: 21 IU/L (ref 0–40)
Albumin/Globulin Ratio: 1.6 (ref 1.2–2.2)
Albumin: 4.5 g/dL (ref 3.9–4.9)
Alkaline Phosphatase: 114 IU/L (ref 44–121)
BUN/Creatinine Ratio: 5 — ABNORMAL LOW (ref 12–28)
BUN: 5 mg/dL — ABNORMAL LOW (ref 8–27)
Bilirubin Total: 0.7 mg/dL (ref 0.0–1.2)
CO2: 21 mmol/L (ref 20–29)
Calcium: 9.8 mg/dL (ref 8.7–10.3)
Chloride: 102 mmol/L (ref 96–106)
Creatinine, Ser: 0.97 mg/dL (ref 0.57–1.00)
Globulin, Total: 2.9 g/dL (ref 1.5–4.5)
Glucose: 92 mg/dL (ref 70–99)
Potassium: 4 mmol/L (ref 3.5–5.2)
Sodium: 142 mmol/L (ref 134–144)
Total Protein: 7.4 g/dL (ref 6.0–8.5)
eGFR: 66 mL/min/{1.73_m2} (ref >59)

## 2022-10-06 LAB — LIPID PANEL W/O CHOL/HDL RATIO
Cholesterol, Total: 195 mg/dL (ref 100–199)
HDL: 47 mg/dL (ref >39)
LDL Chol Calc (NIH): 97 mg/dL (ref 0–99)
Triglycerides: 307 mg/dL — ABNORMAL HIGH (ref 0–149)
VLDL Cholesterol Cal: 51 mg/dL — ABNORMAL HIGH (ref 5–40)

## 2022-10-06 LAB — MAGNESIUM: Magnesium: 1.8 mg/dL (ref 1.6–2.3)

## 2022-10-06 LAB — VITAMIN D 25 HYDROXY (VIT D DEFICIENCY, FRACTURES): Vit D, 25-Hydroxy: 26.5 ng/mL — ABNORMAL LOW (ref 30.0–100.0)

## 2022-10-06 NOTE — Progress Notes (Signed)
**Note De-Identified [place] Obfuscation** Contacted [facility]   Good evening Deborra, your labs have returned: - Kidney function, creatinine and eGFR, remains normal, as is liver function, AST and ALT.  - Cholesterol labs show mild elevation in triglycerides, but improved LDL level.  Continue Atorvastatin as ordered and we may adjust next visit. - Vitamin D level a little low again, please ensure you are taking Vitamin D3 2000 units daily.   - Remainder of labs all stable.  Any questions? Keep being amazing!!  Thank you for allowing me to participate in your care.  I appreciate you. Kindest regards, Suesan Mohrmann

## 2022-10-16 ENCOUNTER — Other Ambulatory Visit: Payer: Self-pay | Admitting: Nurse Practitioner

## 2022-10-19 NOTE — Telephone Encounter (Signed)
**Note De-Identified [place] Obfuscation** Requested medications are due for refill today.  Provider to determine  Requested medications are on the active medications list.  yes  Last refill. 03/27/2022 #60 [id number] rf  Future visit scheduled.   yes  Notes to clinic.  Refill not delegated    Requested Prescriptions  Pending Prescriptions Disp Refills   tiZANidine (ZANAFLEX) 4 MG tablet [Pharmacy Med Name: tiZANidine HCl 4 MG Oral Tablet] 360 tablet     Sig: TAKE 1 TABLET BY MOUTH EVERY 6  HOURS AS NEEDED FOR MUSCLE  SPASM(S)     Not Delegated - Cardiovascular:  Alpha-2 Agonists - tizanidine Failed - 10/16/2022  7:47 PM      Failed - This refill cannot be delegated      Passed - Valid encounter within last 6 months    Recent Outpatient Visits           2 weeks ago Centrilobular emphysema ([place])   [facility] [place], [place] T, NP   6 months ago Herpes zoster without complication   [facility] [place], [place] T, NP   6 months ago Herpes zoster without complication   [facility] [place], [place] T, NP   6 months ago Depression, major, single episode, moderate ([place])   [facility] [place], [place] T, NP   12 months ago Left lower quadrant abdominal pain   [place], Barbaraann Faster, NP       Future Appointments             In 4 weeks [place], Barbaraann Faster, NP [place], [facility]

## 2022-10-21 ENCOUNTER — Other Ambulatory Visit: Payer: Self-pay

## 2022-10-21 DIAGNOSIS — F17210 Nicotine dependence, cigarettes, uncomplicated: Secondary | ICD-10-CM

## 2022-10-21 DIAGNOSIS — Z87891 Personal history of nicotine dependence: Secondary | ICD-10-CM

## 2022-11-14 NOTE — Patient Instructions (Signed)
**Note De-identified Bethards Obfuscation** Managing Depression, Adult Depression is a mental health condition that affects your thoughts, feelings, and actions. Being diagnosed with depression can bring you relief if you did not know why you have felt or behaved a certain way. It could also leave you feeling overwhelmed. Finding ways to manage your symptoms can help you feel more positive about your future. How to manage lifestyle changes Being depressed is difficult. Depression can increase the level of everyday stress. Stress can make depression symptoms worse. You may believe your symptoms cannot be managed or will never improve. However, there are many things you can try to help manage your symptoms. There is hope. Managing stress  Stress is your body's reaction to life changes and events, both good and bad. Stress can add to your feelings of depression. Learning to manage your stress can help lessen your feelings of depression. Try some of the following approaches to reducing your stress (stress reduction techniques): Listen to music that you enjoy and that inspires you. Try using a meditation app or take a meditation class. Develop a practice that helps you connect with your spiritual self. Walk in nature, pray, or go to a place of worship. Practice deep breathing. To do this, inhale slowly through your nose. Pause at the top of your inhale for a few seconds and then exhale slowly, letting yourself relax. Repeat this three or four times. Practice yoga to help relax and work your muscles. Choose a stress reduction technique that works for you. These techniques take time and practice to develop. Set aside 5-15 minutes a day to do them. Therapists can offer training in these techniques. Do these things to help manage stress: Keep a journal. Know your limits. Set healthy boundaries for yourself and others, such as saying "no" when you think something is too much. Pay attention to how you react to certain situations. You may not be able to  control everything, but you can change your reaction. Add humor to your life by watching funny movies or shows. Make time for activities that you enjoy and that relax you. Spend less time using electronics, especially at night before bed. The light from screens can make your brain think it is time to get up rather than go to bed.  Medicines Medicines, such as antidepressants, are often a part of treatment for depression. Talk with your pharmacist or health care provider about all the medicines, supplements, and herbal products that you take, their possible side effects, and what medicines and other products are safe to take together. Make sure to report any side effects you may have to your health care provider. Relationships Your health care provider may suggest family therapy, couples therapy, or individual therapy as part of your treatment. How to recognize changes Everyone responds differently to treatment for depression. As you recover from depression, you may start to: Have more interest in doing activities. Feel more hopeful. Have more energy. Eat a more regular amount of food. Have better mental focus. It is important to recognize if your depression is not getting better or is getting worse. The symptoms you had in the beginning may return, such as: Feeling tired. Eating too much or too little. Sleeping too much or too little. Feeling restless, agitated, or hopeless. Trouble focusing or making decisions. Having unexplained aches and pains. Feeling irritable, angry, or aggressive. If you or your family members notice these symptoms coming back, let your health care provider know right away. Follow these instructions at home: Activity Try to  **Note De-identified Storr Obfuscation** get some form of exercise each day, such as walking. Try yoga, mindfulness, or other stress reduction techniques. Participate in group activities if you are able. Lifestyle Get enough sleep. Cut down on or stop using caffeine, tobacco,  alcohol, and any other harmful substances. Eat a healthy diet that includes plenty of vegetables, fruits, whole grains, low-fat dairy products, and lean protein. Limit foods that are high in solid fats, added sugar, or salt (sodium). General instructions Take over-the-counter and prescription medicines only as told by your health care provider. Keep all follow-up visits. It is important for your health care provider to check on your mood, behavior, and medicines. Your health care provider may need to make changes to your treatment. Where to find support Talking to others  Friends and family members can be sources of support and guidance. Talk to trusted friends or family members about your condition. Explain your symptoms and let them know that you are working with a health care provider to treat your depression. Tell friends and family how they can help. Finances Find mental health providers that fit with your financial situation. Talk with your health care provider if you are worried about access to food, housing, or medicine. Call your insurance company to learn about your co-pays and prescription plan. Where to find more information You can find support in your area from: Anxiety and Depression Association of America (ADAA): adaa.org Mental Health America: mentalhealthamerica.net National Alliance on Mental Illness: nami.org Contact a health care provider if: You stop taking your antidepressant medicines, and you have any of these symptoms: Nausea. Headache. Light-headedness. Chills and body aches. Not being able to sleep (insomnia). You or your friends and family think your depression is getting worse. Get help right away if: You have thoughts of hurting yourself or others. Get help right away if you feel like you may hurt yourself or others, or have thoughts about taking your own life. Go to your nearest emergency room or: Call 911. Call the National Suicide Prevention Lifeline at  1-800-273-8255 or 988. This is open 24 hours a day. Text the Crisis Text Line at 741741. This information is not intended to replace advice given to you by your health care provider. Make sure you discuss any questions you have with your health care provider. Document Revised: 11/25/2021 Document Reviewed: 11/25/2021 Elsevier Patient Education  2023 Elsevier Inc.  

## 2022-11-16 ENCOUNTER — Ambulatory Visit (INDEPENDENT_AMBULATORY_CARE_PROVIDER_SITE_OTHER): Payer: Managed Care, Other (non HMO) | Admitting: Nurse Practitioner

## 2022-11-16 ENCOUNTER — Encounter: Payer: Self-pay | Admitting: Nurse Practitioner

## 2022-11-16 VITALS — BP 118/80 | HR 73 | Temp 98.8°F | Ht 60.39 in | Wt 97.8 lb

## 2022-11-16 DIAGNOSIS — F411 Generalized anxiety disorder: Secondary | ICD-10-CM

## 2022-11-16 DIAGNOSIS — F321 Major depressive disorder, single episode, moderate: Secondary | ICD-10-CM

## 2022-11-16 MED ORDER — SERTRALINE HCL 100 MG PO TABS: 200.0000 mg | ORAL_TABLET | Freq: Every day | ORAL | 4 refills | Status: DC

## 2022-11-16 NOTE — Assessment & Plan Note (Signed)
**Note De-identified Uddin Obfuscation** Refer to depression plan of care. 

## 2022-11-16 NOTE — Progress Notes (Signed)
**Note De-Identified Litchford Obfuscation** BP 118/80   Pulse 73   Temp 98.8 F (37.1 C) (Oral)   Ht 5' 0.39" (1.534 m)   Wt 97 lb 12.8 oz (44.4 kg)   SpO2 97%   BMI 18.85 kg/m    Subjective:    Patient ID: Haley [name], female    DOB: September 05, 1960, 62 y.o.   MRN: [id number]  HPI: Haley [name] is a 62 y.o. female  Chief Complaint  Patient presents with   Depression   DEPRESSION Follow-up today for change to Prozac on 10/05/22, change performed as she was feeling no further benefit from Sertraline and her sister has benefit from Prozac.  She had wanted to try this. Reports she is having stressors with her sister, as Shena takes care of their mom full-time due to dementia = her sister does not help with caring.  She does not feel that Prozac is helping as much and would like to return to Zoloft -- things will get in head and repeat self over and over with Prozac on board.     She does takes Trazodone for sleep as needed, which helps her rest.   Mood status: stable -- with weather changes too, can get outside Satisfied with current treatment?: yes Symptom severity: moderate  Duration of current treatment : chronic Side effects: no Medication compliance: good compliance Psychotherapy/counseling: yes in the past Previous psychiatric medications: Zoloft Depressed mood:  sometimes Anxious mood: yes Anhedonia: no Significant weight loss or gain: no Insomnia: yes hard to fall asleep -- Trazodone helps Fatigue: yes Feelings of worthlessness or guilt: no Impaired concentration/indecisiveness: no Suicidal ideations: no Hopelessness: no Crying spells: no    11/16/2022    2:21 PM 10/05/2022    3:08 PM 04/15/2022    3:19 PM 04/08/2022    3:31 PM 03/27/2022    3:58 PM  Depression screen PHQ 2/9  Decreased Interest 1 1 0 2 2  Down, Depressed, Hopeless 1 1 0 0 1  PHQ - 2 Score 2 2 0 2 3  Altered sleeping 1 2 1 2 3   Tired, decreased energy 2 3 1 2 3   Change in appetite 2 2 0 0 3  Feeling bad or failure about yourself  0 0 0 0 0   Trouble concentrating 0 0 0 0 0  Moving slowly or fidgety/restless 0 0 0 0 0  Suicidal thoughts 0 0 0 0 0  PHQ-9 Score 7 9 2 6 12   Difficult doing work/chores Somewhat difficult Somewhat difficult Not difficult at all Not difficult at all Somewhat difficult       11/16/2022    2:22 PM 10/05/2022    3:08 PM 04/15/2022    3:20 PM 04/08/2022    3:31 PM  GAD 7 : Generalized Anxiety Score  Nervous, Anxious, on Edge 2 2 1 1   Control/stop worrying 0 1 0 0  Worry too much - different things 0 2 0 0  Trouble relaxing 1 2 0 1  Restless 0 1 0 0  Easily annoyed or irritable 2 2 0 1  Afraid - awful might happen 1 1 0 1  Total GAD 7 Score 6 11 1 4   Anxiety Difficulty Somewhat difficult Somewhat difficult Not difficult at all Not difficult at all   Relevant past medical, surgical, family and social history reviewed and updated as indicated. Interim medical history since our last visit reviewed. Allergies and medications reviewed and updated.  Review of Systems  Constitutional:  Negative for activity **Note De-Identified Serfass Obfuscation** change, appetite change, diaphoresis, fatigue and fever.  Respiratory:  Negative for cough, chest tightness and shortness of breath.   Cardiovascular:  Negative for chest pain, palpitations and leg swelling.  Gastrointestinal: Negative.   Neurological: Negative.   Psychiatric/Behavioral:  Positive for sleep disturbance. Negative for decreased concentration, self-injury and suicidal ideas. The patient is nervous/anxious.     Per HPI unless specifically indicated above     Objective:    BP 118/80   Pulse 73   Temp 98.8 F (37.1 C) (Oral)   Ht 5' 0.39" (1.534 m)   Wt 97 lb 12.8 oz (44.4 kg)   SpO2 97%   BMI 18.85 kg/m   Wt Readings from Last 3 Encounters:  11/16/22 97 lb 12.8 oz (44.4 kg)  10/05/22 98 lb 14.4 oz (44.9 kg)  04/15/22 98 lb 12.8 oz (44.8 kg)    Physical Exam Vitals and nursing note reviewed.  Constitutional:      General: She is awake. She is not in acute distress.     Appearance: She is well-developed. She is not ill-appearing or toxic-appearing.  HENT:     Head: Normocephalic.     Right Ear: Hearing normal.     Left Ear: Hearing normal.     Nose: Nose normal.     Mouth/Throat:     Dentition: Abnormal dentition.  Eyes:     General: Lids are normal.        Right eye: No discharge.        Left eye: No discharge.     Conjunctiva/sclera: Conjunctivae normal.     Pupils: Pupils are equal, round, and reactive to light.  Neck:     Thyroid: No thyromegaly.     Vascular: No carotid bruit or JVD.  Cardiovascular:     Rate and Rhythm: Normal rate and regular rhythm.     Heart sounds: Normal heart sounds. No murmur heard.    No gallop.  Pulmonary:     Effort: Pulmonary effort is normal. No accessory muscle usage or respiratory distress.     Breath sounds: Normal breath sounds.  Abdominal:     General: Bowel sounds are normal. There is no distension.     Palpations: Abdomen is soft.     Tenderness: There is no abdominal tenderness. There is no right CVA tenderness or left CVA tenderness.  Musculoskeletal:     Cervical back: Normal, normal range of motion and neck supple.     Thoracic back: Normal.     Right lower leg: No edema.     Left lower leg: No edema.  Lymphadenopathy:     Head:     Right side of head: No submental, submandibular, tonsillar, preauricular or posterior auricular adenopathy.     Left side of head: No submental, submandibular, tonsillar, preauricular or posterior auricular adenopathy.     Cervical: No cervical adenopathy.  Skin:    General: Skin is warm and dry.  Neurological:     Mental Status: She is alert and oriented to person, place, and time.     Deep Tendon Reflexes: Reflexes are normal and symmetric.     Reflex Scores:      Brachioradialis reflexes are 2+ on the right side and 2+ on the left side.      Patellar reflexes are 2+ on the right side and 2+ on the left side. Psychiatric:        Attention and Perception:  Attention normal.        Mood **Note De-Identified Steen Obfuscation** and Affect: Mood normal.        Speech: Speech normal.        Behavior: Behavior normal. Behavior is cooperative.        Thought Content: Thought content normal.     Results for orders placed or performed in visit on 10/05/22  WET PREP FOR TRICH, YEAST, CLUE   Specimen: Urine   Urine  Result Value Ref Range   Trichomonas Exam Negative Negative   Yeast Exam Negative Negative   Clue Cell Exam Positive (A) Negative  Microscopic Examination   Urine  Result Value Ref Range   WBC, UA 0-5 0 - 5 /hpf   RBC, Urine 0-2 0 - 2 /hpf   Epithelial Cells (non renal) 0-10 0 - 10 /hpf   Mucus, UA Present (A) Not Estab.   Bacteria, UA None seen None seen/Few  CBC with Differential/Platelet  Result Value Ref Range   WBC 7.9 3.4 - 10.8 x10E3/uL   RBC 4.83 3.77 - 5.28 x10E6/uL   Hemoglobin 15.1 11.1 - 15.9 g/dL   Hematocrit 16.1 09.6 - 46.6 %   MCV 96 79 - 97 fL   MCH 31.3 26.6 - 33.0 pg   MCHC 32.6 31.5 - 35.7 g/dL   RDW 04.5 40.9 - 81.1 %   Platelets 364 150 - 450 x10E3/uL   Neutrophils 72 Not Estab. %   Lymphs 20 Not Estab. %   Monocytes 5 Not Estab. %   Eos 2 Not Estab. %   Basos 1 Not Estab. %   Neutrophils Absolute 5.7 1.4 - 7.0 x10E3/uL   Lymphocytes Absolute 1.6 0.7 - 3.1 x10E3/uL   Monocytes Absolute 0.4 0.1 - 0.9 x10E3/uL   EOS (ABSOLUTE) 0.1 0.0 - 0.4 x10E3/uL   Basophils Absolute 0.1 0.0 - 0.2 x10E3/uL   Immature Granulocytes 0 Not Estab. %   Immature Grans (Abs) 0.0 0.0 - 0.1 x10E3/uL  Comprehensive metabolic panel  Result Value Ref Range   Glucose 92 70 - 99 mg/dL   BUN 5 (L) 8 - 27 mg/dL   Creatinine, Ser 9.14 0.57 - 1.00 mg/dL   eGFR 66 >78 GN/FAO/1.30   BUN/Creatinine Ratio 5 (L) 12 - 28   Sodium 142 134 - 144 mmol/L   Potassium 4.0 3.5 - 5.2 mmol/L   Chloride 102 96 - 106 mmol/L   CO2 21 20 - 29 mmol/L   Calcium 9.8 8.7 - 10.3 mg/dL   Total Protein 7.4 6.0 - 8.5 g/dL   Albumin 4.5 3.9 - 4.9 g/dL   Globulin, Total 2.9 1.5 - 4.5  g/dL   Albumin/Globulin Ratio 1.6 1.2 - 2.2   Bilirubin Total 0.7 0.0 - 1.2 mg/dL   Alkaline Phosphatase 114 44 - 121 IU/L   AST 21 0 - 40 IU/L   ALT 16 0 - 32 IU/L  Lipid Panel w/o Chol/HDL Ratio  Result Value Ref Range   Cholesterol, Total 195 100 - 199 mg/dL   Triglycerides 865 (H) 0 - 149 mg/dL   HDL 47 >78 mg/dL   VLDL Cholesterol Cal 51 (H) 5 - 40 mg/dL   LDL Chol Calc (NIH) 97 0 - 99 mg/dL  VITAMIN D 25 Hydroxy (Vit-D Deficiency, Fractures)  Result Value Ref Range   Vit D, 25-Hydroxy 26.5 (L) 30.0 - 100.0 ng/mL  TSH  Result Value Ref Range   TSH 1.900 0.450 - 4.500 uIU/mL  Magnesium  Result Value Ref Range   Magnesium 1.8 1.6 - 2.3 mg/dL  Urinalysis, Routine **Note De-Identified Latterell Obfuscation** w reflex microscopic  Result Value Ref Range   Specific Gravity, UA 1.010 1.005 - 1.030   pH, UA 7.0 5.0 - 7.5   Color, UA Yellow Yellow   Appearance Ur Clear Clear   Leukocytes,UA Negative Negative   Protein,UA Negative Negative/Trace   Glucose, UA Negative Negative   Ketones, UA Negative Negative   RBC, UA 1+ (A) Negative   Bilirubin, UA Negative Negative   Urobilinogen, Ur 0.2 0.2 - 1.0 mg/dL   Nitrite, UA Negative Negative   Microscopic Examination See below:       Assessment & Plan:   Problem List Items Addressed This Visit       Other   Depression, major, single episode, moderate - Primary    Chronic, ongoing.  Denies SI/HI.  Will restart Zoloft 200 MG daily, which she preferred.  Recommend she continue this daily, appears to have more seasonal symptoms.  Return in 5 months.      Relevant Medications   sertraline (ZOLOFT) 100 MG tablet   Generalized anxiety disorder    Refer to depression plan of care.      Relevant Medications   sertraline (ZOLOFT) 100 MG tablet     Follow up plan: Return in about 5 months (around 04/18/2023) for Annual physical.

## 2022-11-16 NOTE — Assessment & Plan Note (Signed)
**Note De-Identified Rambert Obfuscation** Chronic, ongoing.  Denies SI/HI.  Will restart Zoloft 200 MG daily, which she preferred.  Recommend she continue this daily, appears to have more seasonal symptoms.  Return in 5 months.

## 2022-12-08 ENCOUNTER — Telehealth: Payer: Self-pay

## 2022-12-08 NOTE — Transitions of Care (Post Inpatient/ED Visit) (Unsigned)
**Note De-identified Sarris Obfuscation**   **Note De-Identified Creamer Obfuscation** 12/08/2022  Name: Haley [name] MRN: [id number] DOB: January 06, 1961  Today's TOC FU Call Status: Today's TOC FU Call Status:: Unsuccessul Call (1st Attempt) Unsuccessful Call (1st Attempt) Date: 12/08/22  Attempted to reach the patient regarding the most recent Inpatient/ED visit.  Follow Up Plan: Additional outreach attempts will be made to reach the patient to complete the Transitions of Care (Post Inpatient/ED visit) call.   Signature [place], [facility]

## 2022-12-10 NOTE — Transitions of Care (Post Inpatient/ED Visit) (Signed)
**Note De-Identified Macintyre Obfuscation** 12/10/2022  Name: Haley [name] MRN: [id number] DOB: 1960/11/26  Today's TOC FU Call Status: Today's TOC FU Call Status:: Successful TOC FU Call Competed Unsuccessful Call (1st Attempt) Date: 12/08/22 [facility] FU Call Complete Date: 12/10/22  Transition Care Management Follow-up Telephone Call Date of Discharge: 12/05/22 Discharge Facility: Other [facility]) Name of Other (Non[facility]) Discharge Facility: [facility] ER Type of Discharge: Emergency Department How have you been since you were released from the hospital?: Same Any questions or concerns?: No  Items Reviewed: Did you receive and understand the discharge instructions provided?: Yes Medications obtained,verified, and reconciled?: Yes (Medications Reviewed) Any new allergies since your discharge?: No Dietary orders reviewed?: NA Do you have support at home?: No  Medications Reviewed Today: Medications Reviewed Today     Reviewed by Marjie Skiff, NP (Nurse Practitioner) on 11/16/22 at 1437  Med List Status: <None>    albuterol (VENTOLIN HFA) 108 (90 Base) MCG/ACT inhaler   Order: [id number] Taking?: Yes Sig: Inhale 2 puffs into the lungs every 6 (six) hours as needed for wheezing or shortness of breath. Documenting Provider: Marjie Skiff, NP Last Dose: Taking Status: Active Informant: Not Known        atorvastatin (LIPITOR) 10 MG tablet   Order: [id number] Taking?: Yes Sig: Take 1 tablet (10 mg total) by mouth daily. Documenting Provider: Marjie Skiff, NP Last Dose: Taking Status: Active Informant: Not Known        fexofenadine (ALLEGRA ALLERGY) 180 MG tablet   Order: [id number] Taking?: Yes Sig: Take 1 tablet (180 mg total) by mouth daily. Documenting Provider: Marjie Skiff, NP Last Dose: Taking Status: Active Informant: Not Known        fluticasone (FLONASE) 50 MCG/ACT nasal spray   Order: [id number] Taking?: Yes Sig: Use 2 spray(s) in each nostril once  daily Documenting Provider: Marjie Skiff, NP Last Dose: Taking Status: Active Informant: Not Known        hydrocortisone 2.5 % ointment   Order: [id number] Taking?: Yes Sig: Apply topically 2 (two) times daily. To corners of mouth for up to 1 week. Documenting Provider: Sandi Mealy, MD Last Dose: Taking Status: Active Informant: Not Known        hydrOXYzine (VISTARIL) 25 MG capsule   Order: [id number] Taking?: Yes Sig: TAKE 1 CAPSULE BY MOUTH THREE TIMES DAILY AS NEEDED FOR ANXIETY Documenting Provider: Marjie Skiff, NP Last Dose: Taking Status: Active Informant: Not Known        montelukast (SINGULAIR) 10 MG tablet   Order: [id number] Taking?: Yes Sig: Take 1 tablet (10 mg total) by mouth at bedtime. Documenting Provider: Marjie Skiff, NP Last Dose: Taking Status: Active Informant: Not Known        omeprazole (PRILOSEC) 20 MG capsule   Order: [id number] Taking?: Yes Sig: TAKE 1 CAPSULE BY MOUTH TWICE DAILY BEFORE A MEAL Documenting Provider: Marjie Skiff, NP Last Dose: Taking Status: Active Informant: Not Known        ondansetron (ZOFRAN) 4 MG tablet   Order: [id number] Taking?: Yes Sig: Take 1 tablet (4 mg total) by mouth every 8 (eight) hours as needed for nausea or vomiting. Documenting Provider: Marjie Skiff, NP Last Dose: Taking Status: Active Informant: Not Known        potassium chloride (KLOR-CON) 10 MEQ tablet   Order: [id number] Taking?: Yes Sig: Take 10 mEq by mouth daily. Documenting Provider: [provider] Last Dose: Taking Status: Active Informant: Not Known **Note De-Identified Clippard Obfuscation** sertraline (ZOLOFT) 100 MG tablet   Order: [id number] Taking?: Yes Sig: Take 2 tablets (200 mg total) by mouth daily. Documenting Provider: Marjie Skiff, NP Last Dose: None Status: Active Informant: Not Known        tiZANidine (ZANAFLEX) 4 MG tablet   Order: [id number] Taking?: Yes Sig: TAKE 1 TABLET BY MOUTH  EVERY 6  HOURS AS NEEDED FOR MUSCLE  SPASM(S) Documenting Provider: Marjie Skiff, NP Last Dose: Taking Status: Active Informant: Not Known        traZODone (DESYREL) 50 MG tablet   Order: [id number] Taking?: Yes Sig: Take 50 mg by mouth at bedtime. Documenting Provider: [provider] Last Dose: Taking Status: Active Informant: Not Known                Home Care and Equipment/Supplies: Were Home Health Services Ordered?: NA Any new equipment or medical supplies ordered?: NA  Functional Questionnaire: Do you need assistance with bathing/showering or dressing?: No Do you need assistance with meal preparation?: No Do you need assistance with eating?: No Do you have difficulty maintaining continence: No Do you need assistance with getting out of bed/getting out of a chair/moving?: No Do you have difficulty managing or taking your medications?: No  Follow up appointments reviewed: PCP Follow-up appointment confirmed?: Yes Date of PCP follow-up appointment?: 12/14/22 Follow-up Provider: [facility] Follow-up appointment confirmed?: NA Do you need transportation to your follow-up appointment?: No Do you understand care options if your condition(s) worsen?: Yes-patient verbalized understanding    SIGNATURE Leward Quan, [facility]

## 2022-12-14 ENCOUNTER — Ambulatory Visit: Payer: Managed Care, Other (non HMO) | Admitting: Nurse Practitioner

## 2022-12-17 ENCOUNTER — Ambulatory Visit (INDEPENDENT_AMBULATORY_CARE_PROVIDER_SITE_OTHER): Payer: Managed Care, Other (non HMO) | Admitting: Nurse Practitioner

## 2022-12-17 ENCOUNTER — Encounter: Payer: Self-pay | Admitting: Nurse Practitioner

## 2022-12-17 VITALS — BP 138/70 | HR 78 | Temp 98.4°F | Ht 60.39 in | Wt 96.5 lb

## 2022-12-17 DIAGNOSIS — R0781 Pleurodynia: Secondary | ICD-10-CM | POA: Insufficient documentation

## 2022-12-17 DIAGNOSIS — S62644A Nondisplaced fracture of proximal phalanx of right ring finger, initial encounter for closed fracture: Secondary | ICD-10-CM | POA: Insufficient documentation

## 2022-12-17 DIAGNOSIS — R0789 Other chest pain: Secondary | ICD-10-CM | POA: Insufficient documentation

## 2022-12-17 MED ORDER — HYDROCODONE-ACETAMINOPHEN 5-325 MG PO TABS: 1.0000 | ORAL_TABLET | Freq: Four times a day (QID) | ORAL | 0 refills | Status: AC | PRN

## 2022-12-17 NOTE — Assessment & Plan Note (Signed)
**Note De-Identified Jourdan Obfuscation** Post fall on 12/04/22.  No imaging done in ER on rib area.  At this time she wishes to defer imaging since is 12 days out from fall, she has no s/s PNA and overall reassuring exam today.  Deferral of imaging is appropriate at this time.  Will send in Norco to use sparsely and as needed for severe pain only.  Recommend heating pad and Voltaren gel as needed for pain.  Ensure deep breathing exercises Q4H with support of ribs with firm pillow to help prevent PNA.  Return if any worsening symptoms present.

## 2022-12-17 NOTE — Assessment & Plan Note (Signed)
**Note De-Identified Mostafa Obfuscation** After fall on 12/04/22, is scheduled to see ortho upcoming at [facility] 12/22/22.  Recommend she maintain this appointment.  Overall reassuring exam today. Will send in 5 day supply of Norco for pain and recommend she use this sparsely and only as needed for severe pain.  Continues Tylenol and muscle relaxer as needed + rest.

## 2022-12-17 NOTE — Progress Notes (Signed)
**Note De-Identified Shaft Obfuscation** BP 138/70   Pulse 78   Temp 98.4 F (36.9 C) (Oral)   Ht 5' 0.39" (1.534 m)   Wt 96 lb 8 oz (43.8 kg)   SpO2 97%   BMI 18.60 kg/m    Subjective:    Patient ID: Haley [name], female    DOB: Mar 09, 1961, 62 y.o.   MRN: [id number]  HPI: Haley [name] is a 62 y.o. female  Chief Complaint  Patient presents with   Fall    Was seen in ER on 5/4 for fall, broke right ring finger, and having right side rib pain(not evaluated in ER pain started next day). Patient was referred to Ortho   ER FOLLOW UP Had fall 12/04/22.  Seen in [facility] ER on 12/05/2022 for closed nondisplaced fracture of proximal phalanx right ring finger which took place after carrying a milk jug and then tripping and falling.  Landed on right hand.  She self reduced dislocation at home.  Imaging in ER did not a minimally displaced intra-articular fracture at base of proximal phalanx.  She was placed in splint and to follow-up with plastic surgery.  She is scheduled to see them on 12/22/22.  She is right hand dominant.   She is concerned she also broke her ribs on right side, but they did not image this.  States this is what hurts her the most right now.  Does not recall bruising there.  When landed she did hit full right side.  Denies cough, SOB, CP, fever.  Currently taking muscle relaxer and Tylenol together at home for pain, but feels it is not strong enough.   Time since discharge: 12 days Hospital/facility: [facility] Diagnosis: closed nondisplaced fracture of proximal phalanx right ring finger Procedures/tests: imaging Consultants: Ortho New medications: none Discharge instructions:  follow-up with ortho Status: fluctuating   Relevant past medical, surgical, family and social history reviewed and updated as indicated. Interim medical history since our last visit reviewed. Allergies and medications reviewed and updated.  Review of Systems  Constitutional:  Negative for activity change, appetite change, diaphoresis,  fatigue and fever.  Respiratory:  Negative for cough, chest tightness and shortness of breath.   Cardiovascular:  Negative for chest pain, palpitations and leg swelling.  Gastrointestinal: Negative.   Neurological: Negative.   Psychiatric/Behavioral:  Positive for sleep disturbance (due to pain). Negative for decreased concentration, self-injury and suicidal ideas. The patient is nervous/anxious.    Per HPI unless specifically indicated above     Objective:    BP 138/70   Pulse 78   Temp 98.4 F (36.9 C) (Oral)   Ht 5' 0.39" (1.534 m)   Wt 96 lb 8 oz (43.8 kg)   SpO2 97%   BMI 18.60 kg/m   Wt Readings from Last 3 Encounters:  12/17/22 96 lb 8 oz (43.8 kg)  11/16/22 97 lb 12.8 oz (44.4 kg)  10/05/22 98 lb 14.4 oz (44.9 kg)    Physical Exam Vitals and nursing note reviewed.  Constitutional:      General: She is awake. She is not in acute distress.    Appearance: She is well-developed. She is not ill-appearing or toxic-appearing.  HENT:     Head: Normocephalic.     Right Ear: Hearing normal.     Left Ear: Hearing normal.     Nose: Nose normal.     Mouth/Throat:     Mouth: Mucous membranes are moist.  Eyes:     General: Lids are normal. **Note De-Identified Debruyne Obfuscation** Right eye: No discharge.        Left eye: No discharge.     Conjunctiva/sclera: Conjunctivae normal.     Pupils: Pupils are equal, round, and reactive to light.  Neck:     Thyroid: No thyromegaly.     Vascular: No carotid bruit or JVD.  Cardiovascular:     Rate and Rhythm: Normal rate and regular rhythm.     Heart sounds: Normal heart sounds. No murmur heard.    No gallop.  Pulmonary:     Effort: Pulmonary effort is normal. No accessory muscle usage or respiratory distress.     Breath sounds: Normal breath sounds. No decreased breath sounds, wheezing or rhonchi.     Comments: Mild right side rib tenderness upper near breasts.  No bruising or redness.   Abdominal:     General: Bowel sounds are normal. There is no distension.      Palpations: Abdomen is soft.     Tenderness: There is no abdominal tenderness. There is no right CVA tenderness or left CVA tenderness.  Musculoskeletal:     Right hand: Normal sensation. Normal capillary refill. Normal pulse.     Left hand: Normal.     Cervical back: Normal, normal range of motion and neck supple.     Thoracic back: Normal.     Right lower leg: No edema.     Left lower leg: No edema.     Comments: Splint in place right hand, cap refill normal.  Lymphadenopathy:     Head:     Right side of head: No submental, submandibular, tonsillar, preauricular or posterior auricular adenopathy.     Left side of head: No submental, submandibular, tonsillar, preauricular or posterior auricular adenopathy.     Cervical: No cervical adenopathy.  Skin:    General: Skin is warm and dry.  Neurological:     Mental Status: She is alert and oriented to person, place, and time.     Deep Tendon Reflexes: Reflexes are normal and symmetric.     Reflex Scores:      Brachioradialis reflexes are 2+ on the right side and 2+ on the left side.      Patellar reflexes are 2+ on the right side and 2+ on the left side. Psychiatric:        Attention and Perception: Attention normal.        Mood and Affect: Mood normal.        Speech: Speech normal.        Behavior: Behavior normal. Behavior is cooperative.        Thought Content: Thought content normal.    Results for orders placed or performed in visit on 10/05/22  WET PREP FOR TRICH, YEAST, CLUE   Specimen: Urine   Urine  Result Value Ref Range   Trichomonas Exam Negative Negative   Yeast Exam Negative Negative   Clue Cell Exam Positive (A) Negative  Microscopic Examination   Urine  Result Value Ref Range   WBC, UA 0-5 0 - 5 /hpf   RBC, Urine 0-2 0 - 2 /hpf   Epithelial Cells (non renal) 0-10 0 - 10 /hpf   Mucus, UA Present (A) Not Estab.   Bacteria, UA None seen None seen/Few  CBC with Differential/Platelet  Result Value Ref Range    WBC 7.9 3.4 - 10.8 x10E3/uL   RBC 4.83 3.77 - 5.28 x10E6/uL   Hemoglobin 15.1 11.1 - 15.9 g/dL   Hematocrit 40.9 81.1 - 46.6 % **Note De-Identified Lesure Obfuscation** MCV 96 79 - 97 fL   MCH 31.3 26.6 - 33.0 pg   MCHC 32.6 31.5 - 35.7 g/dL   RDW 16.1 09.6 - 04.5 %   Platelets 364 150 - 450 x10E3/uL   Neutrophils 72 Not Estab. %   Lymphs 20 Not Estab. %   Monocytes 5 Not Estab. %   Eos 2 Not Estab. %   Basos 1 Not Estab. %   Neutrophils Absolute 5.7 1.4 - 7.0 x10E3/uL   Lymphocytes Absolute 1.6 0.7 - 3.1 x10E3/uL   Monocytes Absolute 0.4 0.1 - 0.9 x10E3/uL   EOS (ABSOLUTE) 0.1 0.0 - 0.4 x10E3/uL   Basophils Absolute 0.1 0.0 - 0.2 x10E3/uL   Immature Granulocytes 0 Not Estab. %   Immature Grans (Abs) 0.0 0.0 - 0.1 x10E3/uL  Comprehensive metabolic panel  Result Value Ref Range   Glucose 92 70 - 99 mg/dL   BUN 5 (L) 8 - 27 mg/dL   Creatinine, Ser 4.09 0.57 - 1.00 mg/dL   eGFR 66 >81 XB/JYN/8.29   BUN/Creatinine Ratio 5 (L) 12 - 28   Sodium 142 134 - 144 mmol/L   Potassium 4.0 3.5 - 5.2 mmol/L   Chloride 102 96 - 106 mmol/L   CO2 21 20 - 29 mmol/L   Calcium 9.8 8.7 - 10.3 mg/dL   Total Protein 7.4 6.0 - 8.5 g/dL   Albumin 4.5 3.9 - 4.9 g/dL   Globulin, Total 2.9 1.5 - 4.5 g/dL   Albumin/Globulin Ratio 1.6 1.2 - 2.2   Bilirubin Total 0.7 0.0 - 1.2 mg/dL   Alkaline Phosphatase 114 44 - 121 IU/L   AST 21 0 - 40 IU/L   ALT 16 0 - 32 IU/L  Lipid Panel w/o Chol/HDL Ratio  Result Value Ref Range   Cholesterol, Total 195 100 - 199 mg/dL   Triglycerides 562 (H) 0 - 149 mg/dL   HDL 47 >13 mg/dL   VLDL Cholesterol Cal 51 (H) 5 - 40 mg/dL   LDL Chol Calc (NIH) 97 0 - 99 mg/dL  VITAMIN D 25 Hydroxy (Vit-D Deficiency, Fractures)  Result Value Ref Range   Vit D, 25-Hydroxy 26.5 (L) 30.0 - 100.0 ng/mL  TSH  Result Value Ref Range   TSH 1.900 0.450 - 4.500 uIU/mL  Magnesium  Result Value Ref Range   Magnesium 1.8 1.6 - 2.3 mg/dL  Urinalysis, Routine w reflex microscopic  Result Value Ref Range   Specific Gravity,  UA 1.010 1.005 - 1.030   pH, UA 7.0 5.0 - 7.5   Color, UA Yellow Yellow   Appearance Ur Clear Clear   Leukocytes,UA Negative Negative   Protein,UA Negative Negative/Trace   Glucose, UA Negative Negative   Ketones, UA Negative Negative   RBC, UA 1+ (A) Negative   Bilirubin, UA Negative Negative   Urobilinogen, Ur 0.2 0.2 - 1.0 mg/dL   Nitrite, UA Negative Negative   Microscopic Examination See below:       Assessment & Plan:   Problem List Items Addressed This Visit       Musculoskeletal and Integument   Closed nondisplaced fracture of proximal phalanx of right ring finger    After fall on 12/04/22, is scheduled to see ortho upcoming at [facility] 12/22/22.  Recommend she maintain this appointment.  Overall reassuring exam today. Will send in 5 day supply of Norco for pain and recommend she use this sparsely and only as needed for severe pain.  Continues Tylenol and muscle relaxer as needed + rest. **Note De-Identified Tiger Obfuscation** Other   Rib pain on right side - Primary    Post fall on 12/04/22.  No imaging done in ER on rib area.  At this time she wishes to defer imaging since is 12 days out from fall, she has no s/s PNA and overall reassuring exam today.  Deferral of imaging is appropriate at this time.  Will send in Norco to use sparsely and as needed for severe pain only.  Recommend heating pad and Voltaren gel as needed for pain.  Ensure deep breathing exercises Q4H with support of ribs with firm pillow to help prevent PNA.  Return if any worsening symptoms present.        Follow up plan: Return if symptoms worsen or fail to improve.

## 2022-12-17 NOTE — Patient Instructions (Signed)
**Note De-identified Degraaf Obfuscation** Finger Fracture, Adult A finger fracture is a break in any of the bones in your fingers. Your doctor may put a splint or cast on your finger so it will not move while it heals. What are the causes? The main cause of a finger fracture is injury, such as from playing sports, falling, or closing your finger in a drawer or door. What increases the risk? You may be more likely to get this condition: Playing sports. Working with machinery. If you have weak bones (osteoporosis). What are the signs or symptoms? The main symptoms of a fractured finger are pain, bruising, and swelling shortly after the injury. How is this treated? Treatment depends on how severe your fracture is. If the bones are still in place, your doctor may put your finger in a splint. If many fingers are fractured, you may need a cast. A cast may be placed up to the elbow. This will keep your fingers and hand from moving. If the bones are out of place, your doctor may move them back into place by hand or by surgery. You may also need to do physical therapy exercises to help your finger move better and get stronger. Follow these instructions at home: If you have a removable splint:  Wear the splint as told by your doctor. Take it off only as told by your doctor. Check the skin around the splint every day. Tell your doctor if you see problems. Loosen the splint if your fingers: Tingle. Become numb. Turn cold and blue. Keep the splint clean and dry. If you have a nonremovable cast: Do not put pressure on any part of the cast until it is fully hardened. Do not stick anything inside the cast to scratch your skin. Check the skin around the cast every day. Tell your doctor if you see problems. You may put lotion on dry skin around the cast. Do not put lotion on the skin under the cast. Keep the cast clean and dry. Bathing Do not take baths, swim, or use a hot tub. Ask your doctor if you may take showers. If the splint or cast is  not waterproof: Do not let it get wet. Cover it with a watertight covering when you take a bath or shower. Managing pain, stiffness, and swelling If told, put ice on the injured area. To do this: If you have a removable splint, take it off as told by your doctor. Put ice in a plastic bag. Place a towel between your skin and the bag, or between your cast and the bag. Leave the ice on for 20 minutes, 2-3 times a day. Take off the ice if your skin turns bright red. This is very important. If you cannot feel pain, heat, or cold, you have a greater risk of damage to the area. Move your fingers often. Raise the injured area above the level of your heart while you are sitting or lying down. Activity Ask your doctor if you should avoid driving or using machines while you are taking your medicine. Do physical therapy exercises as told by your doctor. Return to your normal activities when your doctor says that it is safe. Ask your doctor when it is safe to drive if you have a splint or cast on your finger. General instructions Do not smoke or use any products that contain nicotine or tobacco. Using these products can make it take longer for your bones to heal. If you need help quitting, ask your doctor. Take over-the-counter and  **Note De-identified Omalley Obfuscation** prescription medicines only as told by your doctor. Keep all follow-up visits. Contact a doctor if: Your pain or swelling gets worse, even with treatment. You have trouble moving your finger. Get help right away if: Your finger gets numb or turns blue. Summary A finger fracture is a break in any of the bones in your fingers. You may need to wear a splint or cast on your finger, so it will not move while it heals. If told, put ice on the injured area for 20 minutes, 2-3 times a day. This information is not intended to replace advice given to you by your health care provider. Make sure you discuss any questions you have with your health care provider. Document Revised:  07/09/2020 Document Reviewed: 05/28/2020 Elsevier Patient Education  2023 [employer]vier Inc.  

## 2022-12-25 ENCOUNTER — Ambulatory Visit: Payer: Managed Care, Other (non HMO) | Admitting: Nurse Practitioner

## 2023-01-06 ENCOUNTER — Ambulatory Visit
Admission: RE | Admit: 2023-01-06 | Discharge: 2023-01-06 | Disposition: A | Discharge status: Home / Self Care | Payer: Managed Care, Other (non HMO) | Source: Ambulatory Visit | Attending: Acute Care | Admitting: Acute Care

## 2023-01-06 DIAGNOSIS — F17210 Nicotine dependence, cigarettes, uncomplicated: Secondary | ICD-10-CM

## 2023-01-06 DIAGNOSIS — Z87891 Personal history of nicotine dependence: Secondary | ICD-10-CM

## 2023-01-13 ENCOUNTER — Other Ambulatory Visit: Payer: Self-pay | Admitting: Acute Care

## 2023-01-13 ENCOUNTER — Encounter: Payer: Self-pay | Admitting: Nurse Practitioner

## 2023-01-13 DIAGNOSIS — F17210 Nicotine dependence, cigarettes, uncomplicated: Secondary | ICD-10-CM

## 2023-01-13 DIAGNOSIS — Z87891 Personal history of nicotine dependence: Secondary | ICD-10-CM

## 2023-01-13 DIAGNOSIS — Z122 Encounter for screening for malignant neoplasm of respiratory organs: Secondary | ICD-10-CM

## 2023-01-14 ENCOUNTER — Telehealth: Payer: Self-pay | Admitting: Nurse Practitioner

## 2023-01-14 NOTE — Telephone Encounter (Signed)
**Note De-Identified Bertino Obfuscation** Tried returning patient's call and tried to lvm but mail box was full.

## 2023-01-14 NOTE — Telephone Encounter (Signed)
**Note De-Identified Borrayo Obfuscation** Copied from CRM [id number]. Topic: General - Other >> Jan 13, 2023  3:30 PM Clide Dales wrote: Patient is requesting a callback from provider regarding her broke finger.

## 2023-01-20 ENCOUNTER — Ambulatory Visit: Payer: Managed Care, Other (non HMO) | Admitting: Nurse Practitioner

## 2023-01-20 ENCOUNTER — Encounter: Payer: Self-pay | Admitting: Nurse Practitioner

## 2023-01-20 VITALS — BP 105/71 | HR 93 | Ht 60.39 in | Wt 91.6 lb

## 2023-01-20 DIAGNOSIS — S0993XA Unspecified injury of face, initial encounter: Secondary | ICD-10-CM

## 2023-01-20 DIAGNOSIS — S62644D Nondisplaced fracture of proximal phalanx of right ring finger, subsequent encounter for fracture with routine healing: Secondary | ICD-10-CM | POA: Diagnosis not present

## 2023-01-20 MED ORDER — HYDROCODONE-ACETAMINOPHEN 5-325 MG PO TABS: 1.0000 | ORAL_TABLET | Freq: Four times a day (QID) | ORAL | 0 refills | Status: AC | PRN

## 2023-01-20 MED ORDER — LIDOCAINE VISCOUS HCL 2 % MT SOLN: 15.0000 mL | OROMUCOSAL | 0 refills | Status: DC | PRN

## 2023-01-20 NOTE — Progress Notes (Signed)
**Note De-Identified Hernan Obfuscation** BP 105/71   Pulse 93   Ht 5' 0.39" (1.534 m)   Wt 91 lb 9.6 oz (41.5 kg)   SpO2 97%   BMI 17.66 kg/m    Subjective:    Patient ID: Haley [name], female    DOB: [birth year], 62 y.o.   MRN: [id number]  HPI: Haley [name] is a 62 y.o. female  Chief Complaint  Patient presents with   Arm Pain    Right arm pain, missed her ortho f/u appt on and now wants her splint taken off her hand.   Dental Pain    Patient had all her teeth pulled yesterday.   ARM PAIN Seen in [facility] ER on 12/05/2022 for closed nondisplaced fracture of proximal phalanx right ring finger which took place after carrying a milk jug and then tripping and falling. Landed on right hand. She self reduced dislocation at home. Imaging in ER did not a minimally displaced intra-articular fracture at base of proximal phalanx. She was placed in splint and to follow-up with plastic surgery -- was scheduled for 01/08/23, but when she went in the office she was told 01/06/23.  She is right hand dominant.  Duration: weeks Location: right Mechanism of injury: unknown Onset: gradual Severity: mild  Quality:  dull, aching, and throbbing Frequency: intermittent Radiation: no Aggravating factors: bending and movement  Alleviating factors: APAP, NSAIDs, and rest  Status: fluctuating Treatments attempted: rest, ice, heat, APAP, and ibuprofen  Relief with NSAIDs?:  mild Swelling: no Redness: no  Warmth: no Trauma: no Chest pain: no  Shortness of breath: no  Fever: no Decreased sensation: no Paresthesias: no Weakness: no   DENTAL PAIN Had all her teeth pulled yesterday and was told to take Tylenol and Advil which are not helping much.  Has been taking Tizanidine, which has helped her sleep but not helped pain. Duration: 1 day Involved teeth: upper and lower Dentist evaluation: yes Mechanism of injury:   all teeth pulled Onset: gradual Severity: 6/10 Quality: aching and throbbing Frequency: constant Radiation: to back of  throat Aggravating factors: chewing Alleviating factors: rest and nothing Status: fluctuating Treatments attempted: APAP and NSAIDs Relief with NSAIDs?: no Fevers: no Swelling: no Redness: no Paresthesias / decreased sensation: no Sinus pressure: no   Relevant past medical, surgical, family and social history reviewed and updated as indicated. Interim medical history since our last visit reviewed. Allergies and medications reviewed and updated.  Review of Systems  Constitutional:  Negative for activity change, appetite change, diaphoresis, fatigue and fever.  HENT:  Positive for dental problem.   Respiratory:  Negative for cough, chest tightness and shortness of breath.   Cardiovascular:  Negative for chest pain, palpitations and leg swelling.  Gastrointestinal: Negative.   Neurological: Negative.   Psychiatric/Behavioral:  Positive for sleep disturbance (due to pain). Negative for decreased concentration, self-injury and suicidal ideas. The patient is nervous/anxious.     Per HPI unless specifically indicated above     Objective:    BP 105/71   Pulse 93   Ht 5' 0.39" (1.534 m)   Wt 91 lb 9.6 oz (41.5 kg)   SpO2 97%   BMI 17.66 kg/m   Wt Readings from Last 3 Encounters:  01/20/23 91 lb 9.6 oz (41.5 kg)  12/17/22 96 lb 8 oz (43.8 kg)  11/16/22 97 lb 12.8 oz (44.4 kg)    Physical Exam Vitals and nursing note reviewed.  Constitutional:      General: She is awake. She is **Note De-Identified Fouty Obfuscation** not in acute distress.    Appearance: She is well-developed. She is not ill-appearing or toxic-appearing.  HENT:     Head: Normocephalic.     Right Ear: Hearing normal.     Left Ear: Hearing normal.     Nose: Nose normal.     Mouth/Throat:     Mouth: Mucous membranes are moist.     Dentition: Abnormal dentition. Dental tenderness present.     Comments: Edentulous with swelling of gums present.  Bruising to outside of mouth lower left side. Eyes:     General: Lids are normal.        Right eye: No  discharge.        Left eye: No discharge.     Conjunctiva/sclera: Conjunctivae normal.     Pupils: Pupils are equal, round, and reactive to light.  Neck:     Thyroid: No thyromegaly.     Vascular: No carotid bruit or JVD.  Cardiovascular:     Rate and Rhythm: Normal rate and regular rhythm.     Heart sounds: Normal heart sounds. No murmur heard.    No gallop.  Pulmonary:     Effort: Pulmonary effort is normal. No accessory muscle usage or respiratory distress.     Breath sounds: Normal breath sounds. No decreased breath sounds, wheezing or rhonchi.  Abdominal:     General: Bowel sounds are normal. There is no distension.     Palpations: Abdomen is soft.     Tenderness: There is no abdominal tenderness.  Musculoskeletal:     Right hand: Normal sensation. Normal capillary refill. Normal pulse.     Left hand: Normal.     Cervical back: Normal, normal range of motion and neck supple.     Thoracic back: Normal.     Right lower leg: No edema.     Left lower leg: No edema.     Comments: Splint in place right hand, cap refill normal.  Lymphadenopathy:     Head:     Right side of head: No submental, submandibular, tonsillar, preauricular or posterior auricular adenopathy.     Left side of head: No submental, submandibular, tonsillar, preauricular or posterior auricular adenopathy.     Cervical: No cervical adenopathy.  Skin:    General: Skin is warm and dry.  Neurological:     Mental Status: She is alert and oriented to person, place, and time.     Deep Tendon Reflexes: Reflexes are normal and symmetric.     Reflex Scores:      Brachioradialis reflexes are 2+ on the right side and 2+ on the left side.      Patellar reflexes are 2+ on the right side and 2+ on the left side. Psychiatric:        Attention and Perception: Attention normal.        Mood and Affect: Mood normal.        Speech: Speech normal.        Behavior: Behavior normal. Behavior is cooperative.        Thought  Content: Thought content normal.     Results for orders placed or performed in visit on 10/05/22  WET PREP FOR TRICH, YEAST, CLUE   Specimen: Urine   Urine  Result Value Ref Range   Trichomonas Exam Negative Negative   Yeast Exam Negative Negative   Clue Cell Exam Positive (A) Negative  Microscopic Examination   Urine  Result Value Ref Range   WBC, UA 0-5 0 - **Note De-Identified Angelini Obfuscation** 5 /hpf   RBC, Urine 0-2 0 - 2 /hpf   Epithelial Cells (non renal) 0-10 0 - 10 /hpf   Mucus, UA Present (A) Not Estab.   Bacteria, UA None seen None seen/Few  CBC with Differential/Platelet  Result Value Ref Range   WBC 7.9 3.4 - 10.8 x10E3/uL   RBC 4.83 3.77 - 5.28 x10E6/uL   Hemoglobin 15.1 11.1 - 15.9 g/dL   Hematocrit 29.5 62.1 - 46.6 %   MCV 96 79 - 97 fL   MCH 31.3 26.6 - 33.0 pg   MCHC 32.6 31.5 - 35.7 g/dL   RDW 30.8 65.7 - 84.6 %   Platelets 364 150 - 450 x10E3/uL   Neutrophils 72 Not Estab. %   Lymphs 20 Not Estab. %   Monocytes 5 Not Estab. %   Eos 2 Not Estab. %   Basos 1 Not Estab. %   Neutrophils Absolute 5.7 1.4 - 7.0 x10E3/uL   Lymphocytes Absolute 1.6 0.7 - 3.1 x10E3/uL   Monocytes Absolute 0.4 0.1 - 0.9 x10E3/uL   EOS (ABSOLUTE) 0.1 0.0 - 0.4 x10E3/uL   Basophils Absolute 0.1 0.0 - 0.2 x10E3/uL   Immature Granulocytes 0 Not Estab. %   Immature Grans (Abs) 0.0 0.0 - 0.1 x10E3/uL  Comprehensive metabolic panel  Result Value Ref Range   Glucose 92 70 - 99 mg/dL   BUN 5 (L) 8 - 27 mg/dL   Creatinine, Ser 9.62 0.57 - 1.00 mg/dL   eGFR 66 >95 MW/UXL/2.44   BUN/Creatinine Ratio 5 (L) 12 - 28   Sodium 142 134 - 144 mmol/L   Potassium 4.0 3.5 - 5.2 mmol/L   Chloride 102 96 - 106 mmol/L   CO2 21 20 - 29 mmol/L   Calcium 9.8 8.7 - 10.3 mg/dL   Total Protein 7.4 6.0 - 8.5 g/dL   Albumin 4.5 3.9 - 4.9 g/dL   Globulin, Total 2.9 1.5 - 4.5 g/dL   Albumin/Globulin Ratio 1.6 1.2 - 2.2   Bilirubin Total 0.7 0.0 - 1.2 mg/dL   Alkaline Phosphatase 114 44 - 121 IU/L   AST 21 0 - 40 IU/L   ALT 16 0 - 32  IU/L  Lipid Panel w/o Chol/HDL Ratio  Result Value Ref Range   Cholesterol, Total 195 100 - 199 mg/dL   Triglycerides 010 (H) 0 - 149 mg/dL   HDL 47 >27 mg/dL   VLDL Cholesterol Cal 51 (H) 5 - 40 mg/dL   LDL Chol Calc (NIH) 97 0 - 99 mg/dL  VITAMIN D 25 Hydroxy (Vit-D Deficiency, Fractures)  Result Value Ref Range   Vit D, 25-Hydroxy 26.5 (L) 30.0 - 100.0 ng/mL  TSH  Result Value Ref Range   TSH 1.900 0.450 - 4.500 uIU/mL  Magnesium  Result Value Ref Range   Magnesium 1.8 1.6 - 2.3 mg/dL  Urinalysis, Routine w reflex microscopic  Result Value Ref Range   Specific Gravity, UA 1.010 1.005 - 1.030   pH, UA 7.0 5.0 - 7.5   Color, UA Yellow Yellow   Appearance Ur Clear Clear   Leukocytes,UA Negative Negative   Protein,UA Negative Negative/Trace   Glucose, UA Negative Negative   Ketones, UA Negative Negative   RBC, UA 1+ (A) Negative   Bilirubin, UA Negative Negative   Urobilinogen, Ur 0.2 0.2 - 1.0 mg/dL   Nitrite, UA Negative Negative   Microscopic Examination See below:       Assessment & Plan:   Problem List Items Addressed This Visit **Note De-Identified Gilbert Obfuscation** Musculoskeletal and Integument   Closed nondisplaced fracture of proximal phalanx of right ring finger    After fall on 12/04/22, will place referral to ortho here for follow-up.  Overall reassuring exam today. Will send in 5 day supply of Norco for pain and recommend she use this sparsely and only as needed for severe pain.  Continues Tylenol and muscle relaxer as needed + rest.      Relevant Orders   Ambulatory referral to Orthopedic Surgery     Other   Pain due to dental trauma - Primary    Secondary to removal of all teeth.  Pain present on exam today.  Will send in Viscous lidocaine to use as needed + 5 day supply of Norco sent for dental and arm pain.  Recommend warm rinses at home and soft foods only.          Follow up plan: Return for as scheduled on the 24th.

## 2023-01-20 NOTE — Patient Instructions (Signed)
**Note De-identified [place]ch [employer]bfuscation** Dental Pain Dental pain is often a sign that something is wrong with your teeth or gums. You can also have pain after a dental treatment. If you have dental pain, it is important to contact your dentist, especially if the cause of the pain is not known. Dental pain may hurt a lot or a little and can be caused by many things, including: Tooth decay (cavities or caries). Infection. The inner part of the tooth being filled with pus (an abscess). Injury. A crack in the tooth. Gums that move back and expose the root of a tooth. Gum disease. Abnormal grinding or clenching of teeth. Not taking good care of your teeth. Sometimes the cause of pain is not known. You may have pain all the time, or it may happen only when you are: Chewing. Exposed to hot or cold temperatures. Eating or drinking foods or drinks that have a lot of sugar in them, such as soda or candy. Follow these instructions at home: Medicines Take over-the-counter and prescription medicines only as told by your dentist. If you were prescribed an antibiotic medicine, take it as told by your dentist. Do not stop taking it even if you start to feel better. Eating and drinking Do not eat foods or drinks that cause you pain. These include: Very hot or very cold foods or drinks. Sweet or sugary foods or drinks. Managing pain and swelling  If told, put ice on the painful area of your face. To do this: Put ice in a plastic bag. Place a towel between your skin and the bag. Leave the ice on for 20 minutes, 2-3 times a day. Take off the ice if your skin turns bright red. This is very important. If you cannot feel pain, heat, or cold, you have a greater risk of damage to the area. Brushing your teeth Brush your teeth twice a day using a fluoride toothpaste. Use a toothpaste made for sensitive teeth as told by your dentist. Use a soft toothbrush. General instructions Floss your teeth at least once a day. Do not put heat on the outside  of your face. Rinse your mouth often with salt water. To make salt water, dissolve -1 tsp (3-6 g) of salt in 1 cup (237 mL) of warm water. Watch your dental pain. Let your dentist know if there are any changes. Keep all follow-up visits. Contact a dentist if: You have dental pain and you do not know why. Medicine does not help your pain. Your symptoms get worse. You have new symptoms. Get help right away if: You cannot open your mouth. You are having trouble breathing or swallowing. You have a fever. Your face, neck, or jaw is swollen. These symptoms may be an emergency. Get help right away. Call your local emergency services (911 in the [place] Do not wait to see if the symptoms will go away. Do not drive yourself to the hospital. Summary Dental pain may be caused by many things, including tooth decay, injury, or infection. In some cases, the cause is not known. Dental pain may hurt a lot or very little. You may have pain all the time, or you may have it only when you eat or drink. Take over-the-counter and prescription medicines only as told by your dentist. Watch your dental pain for any changes. Let your dentist know if symptoms get worse. This information is not intended to replace advice given to you by your health care provider. Make sure you discuss any questions you have with  **Note De-Identified Yellen Obfuscation** your health care provider. Document Revised: 04/24/2020 Document Reviewed: 04/24/2020 Elsevier Patient Education  2024 [employer].

## 2023-01-20 NOTE — Assessment & Plan Note (Signed)
**Note De-Identified Dorvil Obfuscation** After fall on 12/04/22, will place referral to ortho here for follow-up.  Overall reassuring exam today. Will send in 5 day supply of Norco for pain and recommend she use this sparsely and only as needed for severe pain.  Continues Tylenol and muscle relaxer as needed + rest.

## 2023-01-20 NOTE — Assessment & Plan Note (Signed)
**Note De-Identified Sandt Obfuscation** Secondary to removal of all teeth.  Pain present on exam today.  Will send in Viscous lidocaine to use as needed + 5 day supply of Norco sent for dental and arm pain.  Recommend warm rinses at home and soft foods only.

## 2023-01-24 NOTE — Patient Instructions (Signed)
**Note De-identified [place] [employer]** Dental Pain Dental pain is often a sign that something is wrong with your teeth or gums. You can also have pain after a dental treatment. If you have dental pain, it is important to contact your dentist, especially if the cause of the pain is not known. Dental pain may hurt a lot or a little and can be caused by many things, including: Tooth decay (cavities or caries). Infection. The inner part of the tooth being filled with pus (an abscess). Injury. A crack in the tooth. Gums that move back and expose the root of a tooth. Gum disease. Abnormal grinding or clenching of teeth. Not taking good care of your teeth. Sometimes the cause of pain is not known. You may have pain all the time, or it may happen only when you are: Chewing. Exposed to hot or cold temperatures. Eating or drinking foods or drinks that have a lot of sugar in them, such as soda or candy. Follow these instructions at home: Medicines Take over-the-counter and prescription medicines only as told by your dentist. If you were prescribed an antibiotic medicine, take it as told by your dentist. Do not stop taking it even if you start to feel better. Eating and drinking Do not eat foods or drinks that cause you pain. These include: Very hot or very cold foods or drinks. Sweet or sugary foods or drinks. Managing pain and swelling  If told, put ice on the painful area of your face. To do this: Put ice in a plastic bag. Place a towel between your skin and the bag. Leave the ice on for 20 minutes, 2-3 times a day. Take off the ice if your skin turns bright red. This is very important. If you cannot feel pain, heat, or cold, you have a greater risk of damage to the area. Brushing your teeth Brush your teeth twice a day using a fluoride toothpaste. Use a toothpaste made for sensitive teeth as told by your dentist. Use a soft toothbrush. General instructions Floss your teeth at least once a day. Do not put heat on the outside  of your face. Rinse your mouth often with salt water. To make salt water, dissolve -1 tsp (3-6 g) of salt in 1 cup (237 mL) of warm water. Watch your dental pain. Let your dentist know if there are any changes. Keep all follow-up visits. Contact a dentist if: You have dental pain and you do not know why. Medicine does not help your pain. Your symptoms get worse. You have new symptoms. Get help right away if: You cannot open your mouth. You are having trouble breathing or swallowing. You have a fever. Your face, neck, or jaw is swollen. These symptoms may be an emergency. Get help right away. Call your local emergency services (911 in the [place] Do not wait to see if the symptoms will go away. Do not drive yourself to the hospital. Summary Dental pain may be caused by many things, including tooth decay, injury, or infection. In some cases, the cause is not known. Dental pain may hurt a lot or very little. You may have pain all the time, or you may have it only when you eat or drink. Take over-the-counter and prescription medicines only as told by your dentist. Watch your dental pain for any changes. Let your dentist know if symptoms get worse. This information is not intended to replace advice given to you by your health care provider. Make sure you discuss any questions you have with  **Note De-identified [place] [employer]** your health care provider. Document Revised: 04/24/2020 Document Reviewed: 04/24/2020 Elsevier Patient Education  2024 [employer]  

## 2023-01-25 ENCOUNTER — Ambulatory Visit (INDEPENDENT_AMBULATORY_CARE_PROVIDER_SITE_OTHER): Payer: Managed Care, Other (non HMO) | Admitting: Nurse Practitioner

## 2023-01-25 ENCOUNTER — Encounter: Payer: Self-pay | Admitting: Nurse Practitioner

## 2023-01-25 VITALS — BP 131/86 | HR 87 | Temp 98.6°F | Ht 60.39 in | Wt 92.8 lb

## 2023-01-25 DIAGNOSIS — S0993XA Unspecified injury of face, initial encounter: Secondary | ICD-10-CM

## 2023-01-25 MED ORDER — SERTRALINE HCL 100 MG PO TABS: 200.0000 mg | ORAL_TABLET | Freq: Every day | ORAL | 4 refills | Status: DC

## 2023-01-25 MED ORDER — OMEPRAZOLE 20 MG PO CPDR: DELAYED_RELEASE_CAPSULE | ORAL | 4 refills | Status: DC

## 2023-01-25 MED ORDER — PREDNISONE 20 MG PO TABS: 40.0000 mg | ORAL_TABLET | Freq: Every day | ORAL | 0 refills | Status: AC

## 2023-01-25 NOTE — Progress Notes (Signed)
**Note De-Identified [place] [employer]bfuscation** BP 131/86   Pulse 87   Temp 98.6 F (37 C) (Oral)   Ht 5' 0.39" (1.534 m)   Wt 92 lb 12.8 oz (42.1 kg)   SpO2 97%   BMI 17.89 kg/m    Subjective:    Patient ID: Haley [name], female    DOB: Jun 20, 1961, 62 y.o.   MRN: [id number]  HPI: Haley [name] is a 62 y.o. female  Chief Complaint  Patient presents with   Dental Pain    Pain is much better today    DENTAL PAIN Had all teeth removed 01/19/23 to prep for dentures.  Ordered pain medication on on 01/20/23 at visit due to extreme pain since removal.  Dental pain improving, but is having some sinus pressure and inflammation.   Duration: 6 days Involved teeth: upper and lower Dentist evaluation: yes Mechanism of injury:   teeth removed Onset: sudden Severity: 2/10 Aggravating factors: hard foods and chewing Alleviating factors:  Lidocaine viscous, pain medication, and warm water Status: better Treatments attempted:  as above Relief with NSAIDs?: No NSAIDs Taken Fevers: no Swelling: yes Redness: no Paresthesias / decreased sensation: no Sinus pressure: yes  Relevant past medical, surgical, family and social history reviewed and updated as indicated. Interim medical history since our last visit reviewed. Allergies and medications reviewed and updated.  Review of Systems  Constitutional:  Negative for activity change, appetite change, diaphoresis, fatigue and fever.  HENT:  Positive for dental problem and sinus pressure. Negative for sinus pain.   Respiratory:  Negative for cough, chest tightness and shortness of breath.   Cardiovascular:  Negative for chest pain, palpitations and leg swelling.  Gastrointestinal: Negative.   Neurological: Negative.   Psychiatric/Behavioral:  Negative for decreased concentration, self-injury, sleep disturbance and suicidal ideas. The patient is not nervous/anxious.     Per HPI unless specifically indicated above     Objective:    BP 131/86   Pulse 87   Temp 98.6 F (37 C) (Oral)    Ht 5' 0.39" (1.534 m)   Wt 92 lb 12.8 oz (42.1 kg)   SpO2 97%   BMI 17.89 kg/m   Wt Readings from Last 3 Encounters:  01/25/23 92 lb 12.8 oz (42.1 kg)  01/20/23 91 lb 9.6 oz (41.5 kg)  12/17/22 96 lb 8 oz (43.8 kg)    Physical Exam Vitals and nursing note reviewed.  Constitutional:      General: She is awake. She is not in acute distress.    Appearance: She is well-developed. She is not ill-appearing or toxic-appearing.  HENT:     Head: Normocephalic.     Right Ear: Hearing, tympanic membrane, ear canal and external ear normal.     Left Ear: Hearing, tympanic membrane, ear canal and external ear normal.     Nose: Nose normal. No rhinorrhea.     Right Turbinates: Swollen.     Left Turbinates: Swollen.     Right Sinus: No maxillary sinus tenderness or frontal sinus tenderness.     Left Sinus: No maxillary sinus tenderness or frontal sinus tenderness.     Mouth/Throat:     Mouth: Mucous membranes are moist.     Dentition: Abnormal dentition. Dental tenderness present.     Comments: Edentulous, swelling and redness to gums has improved.  Bruising to outside of mouth lower left and right side, now paler purple/yellow in color. Eyes:     General: Lids are normal.        Right **Note De-Identified Alwine Obfuscation** eye: No discharge.        Left eye: No discharge.     Conjunctiva/sclera: Conjunctivae normal.     Pupils: Pupils are equal, round, and reactive to light.  Neck:     Thyroid: No thyromegaly.     Vascular: No carotid bruit or JVD.  Cardiovascular:     Rate and Rhythm: Normal rate and regular rhythm.     Heart sounds: Normal heart sounds. No murmur heard.    No gallop.  Pulmonary:     Effort: Pulmonary effort is normal. No accessory muscle usage or respiratory distress.     Breath sounds: Normal breath sounds. No decreased breath sounds, wheezing or rhonchi.  Abdominal:     General: Bowel sounds are normal. There is no distension.     Palpations: Abdomen is soft.     Tenderness: There is no abdominal  tenderness.  Musculoskeletal:     Cervical back: Normal, normal range of motion and neck supple.     Thoracic back: Normal.     Right lower leg: No edema.     Left lower leg: No edema.  Lymphadenopathy:     Head:     Right side of head: No submental, submandibular, tonsillar, preauricular or posterior auricular adenopathy.     Left side of head: No submental, submandibular, tonsillar, preauricular or posterior auricular adenopathy.     Cervical: No cervical adenopathy.  Skin:    General: Skin is warm and dry.  Neurological:     Mental Status: She is alert and oriented to person, place, and time.     Deep Tendon Reflexes: Reflexes are normal and symmetric.     Reflex Scores:      Brachioradialis reflexes are 2+ on the right side and 2+ on the left side.      Patellar reflexes are 2+ on the right side and 2+ on the left side. Psychiatric:        Attention and Perception: Attention normal.        Mood and Affect: Mood normal.        Speech: Speech normal.        Behavior: Behavior normal. Behavior is cooperative.        Thought Content: Thought content normal.    Results for orders placed or performed in visit on 10/05/22  WET PREP FOR TRICH, YEAST, CLUE   Specimen: Urine   Urine  Result Value Ref Range   Trichomonas Exam Negative Negative   Yeast Exam Negative Negative   Clue Cell Exam Positive (A) Negative  Microscopic Examination   Urine  Result Value Ref Range   WBC, UA 0-5 0 - 5 /hpf   RBC, Urine 0-2 0 - 2 /hpf   Epithelial Cells (non renal) 0-10 0 - 10 /hpf   Mucus, UA Present (A) Not Estab.   Bacteria, UA None seen None seen/Few  CBC with Differential/Platelet  Result Value Ref Range   WBC 7.9 3.4 - 10.8 x10E3/uL   RBC 4.83 3.77 - 5.28 x10E6/uL   Hemoglobin 15.1 11.1 - 15.9 g/dL   Hematocrit 81.0 17.5 - 46.6 %   MCV 96 79 - 97 fL   MCH 31.3 26.6 - 33.0 pg   MCHC 32.6 31.5 - 35.7 g/dL   RDW 10.2 58.5 - 27.7 %   Platelets 364 150 - 450 x10E3/uL   Neutrophils 72  Not Estab. %   Lymphs 20 Not Estab. %   Monocytes 5 Not Estab. % **Note De-Identified Vanevery Obfuscation** Eos 2 Not Estab. %   Basos 1 Not Estab. %   Neutrophils Absolute 5.7 1.4 - 7.0 x10E3/uL   Lymphocytes Absolute 1.6 0.7 - 3.1 x10E3/uL   Monocytes Absolute 0.4 0.1 - 0.9 x10E3/uL   EOS (ABSOLUTE) 0.1 0.0 - 0.4 x10E3/uL   Basophils Absolute 0.1 0.0 - 0.2 x10E3/uL   Immature Granulocytes 0 Not Estab. %   Immature Grans (Abs) 0.0 0.0 - 0.1 x10E3/uL  Comprehensive metabolic panel  Result Value Ref Range   Glucose 92 70 - 99 mg/dL   BUN 5 (L) 8 - 27 mg/dL   Creatinine, Ser 4.01 0.57 - 1.00 mg/dL   eGFR 66 >02 VO/ZDG/6.44   BUN/Creatinine Ratio 5 (L) 12 - 28   Sodium 142 134 - 144 mmol/L   Potassium 4.0 3.5 - 5.2 mmol/L   Chloride 102 96 - 106 mmol/L   CO2 21 20 - 29 mmol/L   Calcium 9.8 8.7 - 10.3 mg/dL   Total Protein 7.4 6.0 - 8.5 g/dL   Albumin 4.5 3.9 - 4.9 g/dL   Globulin, Total 2.9 1.5 - 4.5 g/dL   Albumin/Globulin Ratio 1.6 1.2 - 2.2   Bilirubin Total 0.7 0.0 - 1.2 mg/dL   Alkaline Phosphatase 114 44 - 121 IU/L   AST 21 0 - 40 IU/L   ALT 16 0 - 32 IU/L  Lipid Panel w/o Chol/HDL Ratio  Result Value Ref Range   Cholesterol, Total 195 100 - 199 mg/dL   Triglycerides 034 (H) 0 - 149 mg/dL   HDL 47 >74 mg/dL   VLDL Cholesterol Cal 51 (H) 5 - 40 mg/dL   LDL Chol Calc (NIH) 97 0 - 99 mg/dL  VITAMIN D 25 Hydroxy (Vit-D Deficiency, Fractures)  Result Value Ref Range   Vit D, 25-Hydroxy 26.5 (L) 30.0 - 100.0 ng/mL  TSH  Result Value Ref Range   TSH 1.900 0.450 - 4.500 uIU/mL  Magnesium  Result Value Ref Range   Magnesium 1.8 1.6 - 2.3 mg/dL  Urinalysis, Routine w reflex microscopic  Result Value Ref Range   Specific Gravity, UA 1.010 1.005 - 1.030   pH, UA 7.0 5.0 - 7.5   Color, UA Yellow Yellow   Appearance Ur Clear Clear   Leukocytes,UA Negative Negative   Protein,UA Negative Negative/Trace   Glucose, UA Negative Negative   Ketones, UA Negative Negative   RBC, UA 1+ (A) Negative   Bilirubin, UA  Negative Negative   Urobilinogen, Ur 0.2 0.2 - 1.0 mg/dL   Nitrite, UA Negative Negative   Microscopic Examination See below:       Assessment & Plan:   Problem List Items Addressed This Visit       Other   Pain due to dental trauma - Primary    Acute and improving.  Has some pain medication and Viscous Lidocaine left to use as needed.  Noticing some sinus pressure since procedure and turbinates slightly swollen.  Will send in Prednisone 40 MG daily for 5 days to assist with sinus symptoms. Suspect related to her recent dental surgery.        Follow up plan: Return for as scheduled September 16th.

## 2023-01-25 NOTE — Assessment & Plan Note (Signed)
**Note De-Identified Lalone Obfuscation** Acute and improving.  Has some pain medication and Viscous Lidocaine left to use as needed.  Noticing some sinus pressure since procedure and turbinates slightly swollen.  Will send in Prednisone 40 MG daily for 5 days to assist with sinus symptoms. Suspect related to her recent dental surgery.

## 2023-04-19 ENCOUNTER — Encounter: Payer: Managed Care, Other (non HMO) | Admitting: Nurse Practitioner

## 2023-04-19 DIAGNOSIS — J432 Centrilobular emphysema: Secondary | ICD-10-CM

## 2023-04-19 DIAGNOSIS — F5101 Primary insomnia: Secondary | ICD-10-CM

## 2023-04-19 DIAGNOSIS — K219 Gastro-esophageal reflux disease without esophagitis: Secondary | ICD-10-CM

## 2023-04-19 DIAGNOSIS — E559 Vitamin D deficiency, unspecified: Secondary | ICD-10-CM

## 2023-04-19 DIAGNOSIS — I700 Atherosclerosis of aorta: Secondary | ICD-10-CM

## 2023-04-19 DIAGNOSIS — M8589 Other specified disorders of bone density and structure, multiple sites: Secondary | ICD-10-CM

## 2023-04-19 DIAGNOSIS — Z0000 Encounter for general adult medical examination without abnormal findings: Secondary | ICD-10-CM

## 2023-04-19 DIAGNOSIS — Z23 Encounter for immunization: Secondary | ICD-10-CM

## 2023-04-19 DIAGNOSIS — F17210 Nicotine dependence, cigarettes, uncomplicated: Secondary | ICD-10-CM

## 2023-04-19 DIAGNOSIS — F411 Generalized anxiety disorder: Secondary | ICD-10-CM

## 2023-04-19 DIAGNOSIS — F321 Major depressive disorder, single episode, moderate: Secondary | ICD-10-CM

## 2023-04-19 DIAGNOSIS — E782 Mixed hyperlipidemia: Secondary | ICD-10-CM

## 2023-06-11 ENCOUNTER — Encounter: Payer: Self-pay | Admitting: Nurse Practitioner

## 2023-06-11 ENCOUNTER — Ambulatory Visit: Payer: Managed Care, Other (non HMO) | Admitting: Nurse Practitioner

## 2023-06-11 VITALS — BP 132/82 | HR 80 | Temp 99.1°F | Wt 88.0 lb

## 2023-06-11 DIAGNOSIS — R052 Subacute cough: Secondary | ICD-10-CM | POA: Diagnosis not present

## 2023-06-11 MED ORDER — AMOXICILLIN-POT CLAVULANATE 875-125 MG PO TABS: 1.0000 | ORAL_TABLET | Freq: Two times a day (BID) | ORAL | 0 refills | Status: AC

## 2023-06-11 MED ORDER — PREDNISONE 20 MG PO TABS: 40.0000 mg | ORAL_TABLET | Freq: Every day | ORAL | 0 refills | Status: AC

## 2023-06-11 MED ORDER — OMEPRAZOLE 20 MG PO CPDR: DELAYED_RELEASE_CAPSULE | ORAL | 4 refills | Status: AC

## 2023-06-11 NOTE — Assessment & Plan Note (Signed)
**Note De-Identified Mercadel Obfuscation** Present for 2 weeks since starting a URI, has underlying emphysema.  No Covid testing at home. At this time will send in Prednisone 40 MG daily x 5 days and Augmentin BID for 7 days.  Recommend: - Increased rest - Increasing Fluids - Acetaminophen as needed for fever/pain.  - Salt water gargling, chloraseptic spray and throat lozenges - Mucinex. Recommend no D in cold medications as is elevating her BP - Saline sinus flushes or a neti pot.  - Humidifying the air.

## 2023-06-11 NOTE — Progress Notes (Signed)
**Note De-Identified [place] Obfuscation** BP 132/82 (BP Location: Left Arm)   Pulse 80   Temp 99.1 F (37.3 C) (Oral)   Wt 88 lb (39.9 kg)   SpO2 98%   BMI 16.96 kg/m    Subjective:    Patient ID: Haley [name], female    DOB: 1961-01-31, 62 y.o.   MRN: [id number]  HPI: Haley [name] is a 62 y.o. female  Chief Complaint  Patient presents with   URI    Patient states she has been having a cough, congestion, sinus pressure, sneezing, fatigue and a headache for almost 2 weeks.    UPPER RESPIRATORY TRACT INFECTION Has been ill for 2 weeks, started out with vertigo and then woke-up with sinus symptoms.  No Covid testing at home.  Has underlying emphysema. Fever: no Cough: yes Shortness of breath:  a little bit Wheezing: a little bit Chest pain: no Chest tightness: no Chest congestion: no Nasal congestion: yes Runny nose: yes Post nasal drip: yes Sneezing: no Sore throat: no Swollen glands: no Sinus pressure: yes Headache: yes Face pain: yes Toothache: yes Ear pain: comes and goes to both Ear pressure: comes and goes to both Eyes red/itching:no Eye drainage/crusting: no  Vomiting: no Rash: no Fatigue: yes Sick contacts: no Strep contacts: no  Context: fluctuating Recurrent sinusitis: no Relief with OTC cold/cough medications: a little bit  Treatments attempted: Mucinex D and Ibuprofen  Relevant past medical, surgical, family and social history reviewed and updated as indicated. Interim medical history since our last visit reviewed. Allergies and medications reviewed and updated.  Review of Systems  Constitutional:  Positive for fatigue. Negative for activity change, appetite change and fever.  HENT:  Positive for congestion, postnasal drip, rhinorrhea, sinus pressure and sinus pain. Negative for ear discharge, ear pain, facial swelling, sneezing, sore throat and voice change.   Respiratory:  Positive for cough, shortness of breath and wheezing. Negative for chest tightness.   Cardiovascular:  Negative  for chest pain, palpitations and leg swelling.  Gastrointestinal: Negative.   Neurological:  Positive for headaches. Negative for dizziness and numbness.  Psychiatric/Behavioral: Negative.      Per HPI unless specifically indicated above     Objective:    BP 132/82 (BP Location: Left Arm)   Pulse 80   Temp 99.1 F (37.3 C) (Oral)   Wt 88 lb (39.9 kg)   SpO2 98%   BMI 16.96 kg/m   Wt Readings from Last 3 Encounters:  06/11/23 88 lb (39.9 kg)  01/25/23 92 lb 12.8 oz (42.1 kg)  01/20/23 91 lb 9.6 oz (41.5 kg)    Physical Exam Vitals and nursing note reviewed.  Constitutional:      General: She is awake. She is not in acute distress.    Appearance: She is well-developed and well-groomed. She is ill-appearing. She is not toxic-appearing.  HENT:     Head: Normocephalic.     Right Ear: Hearing, ear canal and external ear normal. A middle ear effusion is present. Tympanic membrane is not injected or perforated.     Left Ear: Hearing, ear canal and external ear normal. A middle ear effusion is present. Tympanic membrane is not injected or perforated.     Nose: Rhinorrhea present. Rhinorrhea is clear.     Right Sinus: Maxillary sinus tenderness present. No frontal sinus tenderness.     Left Sinus: Maxillary sinus tenderness present. No frontal sinus tenderness.     Mouth/Throat:     Mouth: Mucous membranes are moist. **Note De-Identified [place] Obfuscation** Pharynx: Posterior oropharyngeal erythema (mild) present. No pharyngeal swelling or oropharyngeal exudate.  Eyes:     General: Lids are normal.        Right eye: No discharge.        Left eye: No discharge.     Conjunctiva/sclera: Conjunctivae normal.     Pupils: Pupils are equal, round, and reactive to light.  Neck:     Thyroid: No thyromegaly.     Vascular: No carotid bruit.  Cardiovascular:     Rate and Rhythm: Normal rate and regular rhythm.     Heart sounds: Normal heart sounds. No murmur heard.    No gallop.  Pulmonary:     Effort: Pulmonary effort is  normal. No accessory muscle usage or respiratory distress.     Breath sounds: Wheezing present. No decreased breath sounds or rhonchi.     Comments: Expiratory wheezes present throughout all lung fields. Cough present. Abdominal:     General: Bowel sounds are normal.     Palpations: Abdomen is soft. There is no hepatomegaly or splenomegaly.  Musculoskeletal:     Cervical back: Normal range of motion and neck supple.     Right lower leg: No edema.     Left lower leg: No edema.  Lymphadenopathy:     Head:     Right side of head: No submental, submandibular, tonsillar, preauricular or posterior auricular adenopathy.     Left side of head: No submental, submandibular, tonsillar, preauricular or posterior auricular adenopathy.     Cervical: No cervical adenopathy.  Skin:    General: Skin is warm and dry.  Neurological:     Mental Status: She is alert and oriented to person, place, and time.  Psychiatric:        Attention and Perception: Attention normal.        Mood and Affect: Mood normal.        Speech: Speech normal.        Behavior: Behavior normal. Behavior is cooperative.        Thought Content: Thought content normal.     Results for orders placed or performed in visit on 10/05/22  WET PREP FOR TRICH, YEAST, CLUE   Specimen: Urine   Urine  Result Value Ref Range   Trichomonas Exam Negative Negative   Yeast Exam Negative Negative   Clue Cell Exam Positive (A) Negative  Microscopic Examination   Urine  Result Value Ref Range   WBC, UA 0-5 0 - 5 /hpf   RBC, Urine 0-2 0 - 2 /hpf   Epithelial Cells (non renal) 0-10 0 - 10 /hpf   Mucus, UA Present (A) Not Estab.   Bacteria, UA None seen None seen/Few  CBC with Differential/Platelet  Result Value Ref Range   WBC 7.9 3.4 - 10.8 x10E3/uL   RBC 4.83 3.77 - 5.28 x10E6/uL   Hemoglobin 15.1 11.1 - 15.9 g/dL   Hematocrit 91.4 78.2 - 46.6 %   MCV 96 79 - 97 fL   MCH 31.3 26.6 - 33.0 pg   MCHC 32.6 31.5 - 35.7 g/dL   RDW 95.6  21.3 - 08.6 %   Platelets 364 150 - 450 x10E3/uL   Neutrophils 72 Not Estab. %   Lymphs 20 Not Estab. %   Monocytes 5 Not Estab. %   Eos 2 Not Estab. %   Basos 1 Not Estab. %   Neutrophils Absolute 5.7 1.4 - 7.0 x10E3/uL   Lymphocytes Absolute 1.6 0.7 - 3.1 x10E3/uL **Note De-Identified [place] Obfuscation** Monocytes Absolute 0.4 0.1 - 0.9 x10E3/uL   EOS (ABSOLUTE) 0.1 0.0 - 0.4 x10E3/uL   Basophils Absolute 0.1 0.0 - 0.2 x10E3/uL   Immature Granulocytes 0 Not Estab. %   Immature Grans (Abs) 0.0 0.0 - 0.1 x10E3/uL  Comprehensive metabolic panel  Result Value Ref Range   Glucose 92 70 - 99 mg/dL   BUN 5 (L) 8 - 27 mg/dL   Creatinine, Ser 1.61 0.57 - 1.00 mg/dL   eGFR 66 >09 UE/AVW/0.98   BUN/Creatinine Ratio 5 (L) 12 - 28   Sodium 142 134 - 144 mmol/L   Potassium 4.0 3.5 - 5.2 mmol/L   Chloride 102 96 - 106 mmol/L   CO2 21 20 - 29 mmol/L   Calcium 9.8 8.7 - 10.3 mg/dL   Total Protein 7.4 6.0 - 8.5 g/dL   Albumin 4.5 3.9 - 4.9 g/dL   Globulin, Total 2.9 1.5 - 4.5 g/dL   Albumin/Globulin Ratio 1.6 1.2 - 2.2   Bilirubin Total 0.7 0.0 - 1.2 mg/dL   Alkaline Phosphatase 114 44 - 121 IU/L   AST 21 0 - 40 IU/L   ALT 16 0 - 32 IU/L  Lipid Panel w/o Chol/HDL Ratio  Result Value Ref Range   Cholesterol, Total 195 100 - 199 mg/dL   Triglycerides 119 (H) 0 - 149 mg/dL   HDL 47 >14 mg/dL   VLDL Cholesterol Cal 51 (H) 5 - 40 mg/dL   LDL Chol Calc (NIH) 97 0 - 99 mg/dL  VITAMIN D 25 Hydroxy (Vit-D Deficiency, Fractures)  Result Value Ref Range   Vit D, 25-Hydroxy 26.5 (L) 30.0 - 100.0 ng/mL  TSH  Result Value Ref Range   TSH 1.900 0.450 - 4.500 uIU/mL  Magnesium  Result Value Ref Range   Magnesium 1.8 1.6 - 2.3 mg/dL  Urinalysis, Routine w reflex microscopic  Result Value Ref Range   Specific Gravity, UA 1.010 1.005 - 1.030   pH, UA 7.0 5.0 - 7.5   Color, UA Yellow Yellow   Appearance Ur Clear Clear   Leukocytes,UA Negative Negative   Protein,UA Negative Negative/Trace   Glucose, UA Negative Negative   Ketones,  UA Negative Negative   RBC, UA 1+ (A) Negative   Bilirubin, UA Negative Negative   Urobilinogen, Ur 0.2 0.2 - 1.0 mg/dL   Nitrite, UA Negative Negative   Microscopic Examination See below:       Assessment & Plan:   Problem List Items Addressed This Visit       Other   Subacute cough - Primary    Present for 2 weeks since starting a URI, has underlying emphysema.  No Covid testing at home. At this time will send in Prednisone 40 MG daily x 5 days and Augmentin BID for 7 days.  Recommend: - Increased rest - Increasing Fluids - Acetaminophen as needed for fever/pain.  - Salt water gargling, chloraseptic spray and throat lozenges - Mucinex. Recommend no D in cold medications as is elevating her BP - Saline sinus flushes or a neti pot.  - Humidifying the air.         Follow up plan: Return for needs to reschedule physical.

## 2023-06-11 NOTE — Patient Instructions (Signed)

## 2023-10-27 ENCOUNTER — Emergency Department

## 2023-10-27 ENCOUNTER — Other Ambulatory Visit: Payer: Self-pay

## 2023-10-27 ENCOUNTER — Emergency Department
Admission: EM | Admit: 2023-10-27 | Discharge: 2023-10-27 | Disposition: A | Discharge status: Home / Self Care | Attending: Emergency Medicine | Admitting: Emergency Medicine

## 2023-10-27 DIAGNOSIS — R112 Nausea with vomiting, unspecified: Secondary | ICD-10-CM | POA: Diagnosis present

## 2023-10-27 DIAGNOSIS — R1115 Cyclical vomiting syndrome unrelated to migraine: Secondary | ICD-10-CM | POA: Diagnosis not present

## 2023-10-27 LAB — COMPREHENSIVE METABOLIC PANEL
ALT: 17 U/L (ref 0–44)
AST: 30 U/L (ref 15–41)
Albumin: 4.6 g/dL (ref 3.5–5.0)
Alkaline Phosphatase: 85 U/L (ref 38–126)
Anion gap: 16 — ABNORMAL HIGH (ref 5–15)
BUN: 13 mg/dL (ref 8–23)
CO2: 25 mmol/L (ref 22–32)
Calcium: 9.6 mg/dL (ref 8.9–10.3)
Chloride: 92 mmol/L — ABNORMAL LOW (ref 98–111)
Creatinine, Ser: 0.83 mg/dL (ref 0.44–1.00)
GFR, Estimated: >60 mL/min (ref >60)
Glucose, Bld: 143 mg/dL — ABNORMAL HIGH (ref 70–99)
Potassium: 2.8 mmol/L — ABNORMAL LOW (ref 3.5–5.1)
Sodium: 133 mmol/L — ABNORMAL LOW (ref 135–145)
Total Bilirubin: 1.4 mg/dL — ABNORMAL HIGH (ref 0.0–1.2)
Total Protein: 7.9 g/dL (ref 6.5–8.1)

## 2023-10-27 LAB — LIPASE, BLOOD: Lipase: 26 U/L (ref 11–51)

## 2023-10-27 LAB — CBC
HCT: 43.2 % (ref 36.0–46.0)
Hemoglobin: 15.2 g/dL — ABNORMAL HIGH (ref 12.0–15.0)
MCH: 32.3 pg (ref 26.0–34.0)
MCHC: 35.2 g/dL (ref 30.0–36.0)
MCV: 91.7 fL (ref 80.0–100.0)
Platelets: 306 10*3/uL (ref 150–400)
RBC: 4.71 MIL/uL (ref 3.87–5.11)
RDW: 13.4 % (ref 11.5–15.5)
WBC: 11.2 10*3/uL — ABNORMAL HIGH (ref 4.0–10.5)
nRBC: 0 % (ref 0.0–0.2)

## 2023-10-27 LAB — MAGNESIUM: Magnesium: 1.8 mg/dL (ref 1.7–2.4)

## 2023-10-27 MED ORDER — POTASSIUM CHLORIDE CRYS ER 20 MEQ PO TBCR
40.0000 meq | EXTENDED_RELEASE_TABLET | Freq: Once | ORAL | Status: AC
  Administered 2023-10-27: 40 meq via ORAL
  Filled 2023-10-27: qty 2

## 2023-10-27 MED ORDER — ONDANSETRON HCL 4 MG/2ML IJ SOLN
4.0000 mg | Freq: Once | INTRAMUSCULAR | Status: AC
  Administered 2023-10-27: 4 mg via INTRAVENOUS
  Filled 2023-10-27: qty 2

## 2023-10-27 MED ORDER — ONDANSETRON 4 MG PO TBDP: 4.0000 mg | ORAL_TABLET | Freq: Three times a day (TID) | ORAL | 0 refills | Status: DC | PRN

## 2023-10-27 MED ORDER — ONDANSETRON 4 MG PO TBDP
4.0000 mg | ORAL_TABLET | Freq: Once | ORAL | Status: AC
  Administered 2023-10-27: 4 mg via ORAL
  Filled 2023-10-27: qty 1

## 2023-10-27 MED ORDER — LORAZEPAM 2 MG/ML IJ SOLN
0.5000 mg | Freq: Once | INTRAMUSCULAR | Status: AC
  Administered 2023-10-27: 0.5 mg via INTRAVENOUS
  Filled 2023-10-27: qty 1

## 2023-10-27 MED ORDER — LACTATED RINGERS IV BOLUS
1000.0000 mL | Freq: Once | INTRAVENOUS | Status: AC
  Administered 2023-10-27: 1000 mL via INTRAVENOUS

## 2023-10-27 MED ORDER — POTASSIUM CHLORIDE CRYS ER 20 MEQ PO TBCR
20.0000 meq | EXTENDED_RELEASE_TABLET | Freq: Every day | ORAL | 0 refills | Status: DC
Start: 2023-10-27 — End: 2024-03-07

## 2023-10-27 NOTE — ED Provider Notes (Signed)
**Note De-Identified [place] [facility]bfuscation** [facility] Provider Note    Event Date/Time   First MD Initiated Contact with Patient 10/27/23 1236     (approximate)   History   Chief Complaint Emesis   HPI  Haley [name] is a 63 y.o. female with past medical history of hyperlipidemia, anemia, GERD, and cyclic vomiting syndrome who presents to the ED complaining of nausea and vomiting.  Patient ports that she has had 3 days of persistent nausea and vomiting, has been unable to keep down either liquids or solids during this time.  She reports associated discomfort in the middle of her upper abdomen, denies any associated diarrhea.  She has not noticed any blood in her emesis or stool and denies any fevers, flank pain, or dysuria.  Husband reports that she has similar episodes of symptoms every couple of months.     Physical Exam   Triage Vital Signs: ED Triage Vitals  Encounter Vitals Group     BP 10/27/23 1218 (!) 166/108     Systolic BP Percentile --      Diastolic BP Percentile --      Pulse Rate 10/27/23 1218 76     Resp 10/27/23 1218 20     Temp 10/27/23 1218 98.2 F (36.8 C)     Temp Source 10/27/23 1218 Oral     SpO2 10/27/23 1218 98 %     Weight 10/27/23 1215 90 lb (40.8 kg)     Height 10/27/23 1215 5' (1.524 m)     Head Circumference --      Peak Flow --      Pain Score 10/27/23 1213 5     Pain Loc --      Pain Education --      Exclude from Growth Chart --     Most recent vital signs: Vitals:   10/27/23 1218  BP: (!) 166/108  Pulse: 76  Resp: 20  Temp: 98.2 F (36.8 C)  SpO2: 98%    Constitutional: Alert and oriented. Eyes: Conjunctivae are normal. Head: Atraumatic. Nose: No congestion/rhinnorhea. Mouth/Throat: Mucous membranes are moist.  Cardiovascular: Normal rate, regular rhythm. Grossly normal heart sounds.  2+ radial pulses bilaterally. Respiratory: Normal respiratory effort.  No retractions. Lungs CTAB. Gastrointestinal: Soft and tender to palpation in the  epigastrium with no rebound or guarding. No distention. Musculoskeletal: No lower extremity tenderness nor edema.  Neurologic:  Normal speech and language. No gross focal neurologic deficits are appreciated.    ED Results / Procedures / Treatments   Labs (all labs ordered are listed, but only abnormal results are displayed) Labs Reviewed  COMPREHENSIVE METABOLIC PANEL - Abnormal; Notable for the following components:      Result Value   Sodium 133 (*)    Potassium 2.8 (*)    Chloride 92 (*)    Glucose, Bld 143 (*)    Total Bilirubin 1.4 (*)    Anion gap 16 (*)    All other components within normal limits  CBC - Abnormal; Notable for the following components:   WBC 11.2 (*)    Hemoglobin 15.2 (*)    All other components within normal limits  LIPASE, BLOOD  MAGNESIUM  URINALYSIS, ROUTINE W REFLEX MICROSCOPIC     EKG  ED ECG REPORT I, Chesley Noon, the attending physician, personally viewed and interpreted this ECG.   Date: 10/27/2023  EKG Time: 12:17  Rate: 71  Rhythm: normal sinus rhythm  Axis: LAD  Intervals:left anterior fascicular block  ST&T **Note De-Identified [place] Obfuscation** Change: None  RADIOLOGY Right upper quadrant ultrasound reviewed and interpreted by me with no evidence of cholelithiasis or cholecystitis.  PROCEDURES:  Critical Care performed: No  Procedures   MEDICATIONS ORDERED IN ED: Medications  ondansetron (ZOFRAN-ODT) disintegrating tablet 4 mg (4 mg Oral Given 10/27/23 1221)  ondansetron (ZOFRAN) injection 4 mg (4 mg Intravenous Given 10/27/23 1337)  lactated ringers bolus 1,000 mL (1,000 mLs Intravenous New Bag/Given 10/27/23 1338)  potassium chloride SA (KLOR-CON M) CR tablet 40 mEq (40 mEq Oral Given 10/27/23 1338)  LORazepam (ATIVAN) injection 0.5 mg (0.5 mg Intravenous Given 10/27/23 1435)     IMPRESSION / MDM / ASSESSMENT AND PLAN / ED COURSE  I reviewed the triage vital signs and the nursing notes.                              63 y.o. female with past medical  history of hyperlipidemia, anemia, GERD, and cyclic vomiting syndrome who presents to the ED complaining of persistent nausea and vomiting with epigastric pain over the past 3 days.  Patient's presentation is most consistent with acute presentation with potential threat to life or bodily function.  Differential diagnosis includes, but is not limited to, gastritis, GERD, pancreatitis, hepatitis, cholecystitis, biliary colic, dehydration, electrolyte abnormality, AKI, UTI, cyclic vomiting.  Patient nontoxic-appearing and in no acute distress, vital signs are unremarkable.  Her abdomen is soft but she does have some epigastric tenderness.  Labs without significant anemia or leukocytosis, she does have hypokalemia without AKI.  LFTs and lipase are unremarkable, magnesium within normal limits.  Patient given IV Zofran with improving symptoms, right upper quadrant ultrasound is unremarkable.  Plan for p.o. challenge and urinalysis, if patient able to tolerate oral intake then she would be appropriate for discharge home with outpatient follow-up.  Patient able to tolerate oral intake without difficulty, denies any urinary symptoms and I have low suspicion for UTI at this time.  Symptoms consistent with exacerbation of her cyclic vomiting syndrome and patient counseled to follow-up with her PCP, otherwise return to the ED for new or worsening symptoms.  Patient agrees with plan.      FINAL CLINICAL IMPRESSION(S) / ED DIAGNOSES   Final diagnoses:  Nausea and vomiting, unspecified vomiting type  Cyclic vomiting syndrome     Rx / DC Orders   ED Discharge Orders          Ordered    ondansetron (ZOFRAN-ODT) 4 MG disintegrating tablet  Every 8 hours PRN        10/27/23 1502    potassium chloride SA (KLOR-CON M) 20 MEQ tablet  Daily        10/27/23 1502             Note:  This document was prepared using Dragon voice recognition software and may include unintentional dictation errors.    Chesley Noon, MD 10/27/23 [phone]

## 2023-10-27 NOTE — ED Triage Notes (Signed)
**Note De-Identified [place] Obfuscation** See first nurse note. Pt denies chest pain, states is here for vomiting since 2-3 days ago and pain "in my bowels" lower and bilateral. Per first note pt was making herself vomit in front of EMS. Has GB and appendix. Current smoker. Offered zofran in triage, pt states instead she would like "something to relax me".

## 2023-10-27 NOTE — ED Triage Notes (Signed)
**Note De-Identified [place] Obfuscation** First Nurse Note: Patient to ED [facility]aley [name] [facility] from home for CP. Vomiting x3 days. EMS noted pt had stucked her finger down her throat to make self vomit. Not eating or drinking. Having chills.  EMS VS: 98.3 157 cbg 70 HR 182/99 99% RA

## 2024-01-26 ENCOUNTER — Other Ambulatory Visit: Payer: Self-pay | Admitting: Acute Care

## 2024-01-26 DIAGNOSIS — F17210 Nicotine dependence, cigarettes, uncomplicated: Secondary | ICD-10-CM

## 2024-01-26 DIAGNOSIS — Z87891 Personal history of nicotine dependence: Secondary | ICD-10-CM

## 2024-01-26 DIAGNOSIS — Z122 Encounter for screening for malignant neoplasm of respiratory organs: Secondary | ICD-10-CM

## 2024-02-08 ENCOUNTER — Telehealth: Payer: Self-pay

## 2024-02-08 ENCOUNTER — Other Ambulatory Visit: Payer: Self-pay | Admitting: Pediatrics

## 2024-02-08 ENCOUNTER — Ambulatory Visit (INDEPENDENT_AMBULATORY_CARE_PROVIDER_SITE_OTHER): Payer: PRIVATE HEALTH INSURANCE | Admitting: Pediatrics

## 2024-02-08 VITALS — BP 146/90 | HR 76 | Temp 98.8°F | Wt 80.2 lb

## 2024-02-08 DIAGNOSIS — R030 Elevated blood-pressure reading, without diagnosis of hypertension: Secondary | ICD-10-CM | POA: Diagnosis not present

## 2024-02-08 DIAGNOSIS — F17210 Nicotine dependence, cigarettes, uncomplicated: Secondary | ICD-10-CM

## 2024-02-08 DIAGNOSIS — H6093 Unspecified otitis externa, bilateral: Secondary | ICD-10-CM | POA: Diagnosis not present

## 2024-02-08 DIAGNOSIS — B370 Candidal stomatitis: Secondary | ICD-10-CM

## 2024-02-08 MED ORDER — HYDROCORTISONE-ACETIC ACID 1-2 % OT SOLN: 3.0000 [drp] | Freq: Two times a day (BID) | OTIC | 0 refills | Status: AC

## 2024-02-08 MED ORDER — NYSTATIN 100000 UNIT/ML MT SUSP: 5.0000 mL | Freq: Four times a day (QID) | OROMUCOSAL | 0 refills | Status: DC

## 2024-02-08 MED ORDER — HYDROCORTISONE-ACETIC ACID 1-2 % OT SOLN: 5.0000 [drp] | Freq: Three times a day (TID) | OTIC | 0 refills | Status: DC

## 2024-02-08 NOTE — Telephone Encounter (Signed)
**Note De-Identified [place] Obfuscation** Copied from CRM [id number]. Topic: Clinical - Prescription Issue >> Feb 08, 2024  3:43 PM [place] A wrote: Reason for CRM: [facility] called said nystatin  (MYCOSTATIN ) 100000 UNIT/ML suspension does not match order. Ear drops were sent in twice with different directions. Needs to know which prescription is correct-please contact

## 2024-02-08 NOTE — Telephone Encounter (Signed)
 Routing to provider who saw the patient today

## 2024-02-08 NOTE — Patient Instructions (Addendum)
**Note De-Identified [place] Obfuscation** Sent drops to use in your ear 3 drops- twice daily  For your thrush, I sent a suspension to swish and swallow 4 times daily for 7 days. If not getting better return earlier  Measure your blood pressure at home! Home Blood Pressure Monitoring is an important part of managing blood pressure and thought to be more accurate than the measures we get in the clinic.  Here's some tips on how to take your blood pressure accurately at home and some highly rated monitors. Most insurances (except for Medicaid) won't pay for monitors, so unfortunately they are an out-of-pocket expense for most people.  Taking an accurate blood pressure measurement: To get an accurate blood pressure reading, empty your bladder first, then rest in a seated position for at least 5 minutes. Ideally, no caffeine or tobacco use in last 30 minutes. Use an arm cuff (not wrist - see recommendations below) seated in a chair with a back next to a table or object that is high enough that you can rest your arm so the blood pressure cuff is at the level of your heart and you can lean back comfortably. Keep both feet on the floor and don't talk while the machine is working. Checking at different times of the day can be helpful to get an idea of your average numbers. Your goal blood pressure should be <140/90.

## 2024-02-08 NOTE — Progress Notes (Signed)
**Note De-Identified [place] Obfuscation** Office Visit  BP (!) 146/90   Pulse 76   Temp 98.8 F (37.1 C) (Oral)   Wt 80 lb 3.2 oz (36.4 kg)   SpO2 98%   BMI 15.66 kg/m    Subjective:    Patient ID: Haley [name], female    DOB: May 24, 1961, 63 y.o.   MRN: [id number]  HPI: Haley [name] is a 63 y.o. female  Chief Complaint  Patient presents with   Ear Drainage   Dizziness    On going for the last 2 weeks     Discussed the use of AI scribe software for clinical note transcription with the patient, who gave verbal consent to proceed.  History of Present Illness   Haley [name] is a 63 year old female who presents with ear fullness and neck pain.  She has been experiencing a sensation of fullness in her head for the past two weeks, described as feeling like her head is 'just full'. This is accompanied by neck pain and a feeling of fatigue. She notes a lot of pressure in her head and mentions that bending over and swallowing can make her feel the fluid moving, which provides some relief. The symptoms feel slightly better today.  No ear pain or discharge, but she hears popping and cracking sounds in her ears. She has not experienced these symptoms before and has not been in water  recently, except for a shower where she got a little water  in her ears.  She has a history of having her teeth pulled, which ruptured her sinuses. She was told she does not have any sinus cavities in the upper part of her face, possibly related to a prior dental procedure.  She smokes about half a pack of cigarettes a day and has previously quit smoking for ten years. She wants to quit smoking again and has not used nicotine  patches in the past.  No nasal congestion, cough, sinus pain, or throat pain. She mentions feeling very thirsty recently.      Relevant past medical, surgical, family and social history reviewed and updated as indicated. Interim medical history since our last visit reviewed. Allergies and medications reviewed and  updated.  ROS per HPI unless specifically indicated above     Objective:    BP (!) 146/90   Pulse 76   Temp 98.8 F (37.1 C) (Oral)   Wt 80 lb 3.2 oz (36.4 kg)   SpO2 98%   BMI 15.66 kg/m   Wt Readings from Last 3 Encounters:  02/08/24 80 lb 3.2 oz (36.4 kg)  10/27/23 90 lb (40.8 kg)  06/11/23 88 lb (39.9 kg)     Physical Exam HENT:     Right Ear: Tympanic membrane normal. No middle ear effusion. There is no impacted cerumen.     Left Ear: Tympanic membrane normal.  No middle ear effusion. There is no impacted cerumen.     Ears:     Comments: Erythematous canals with bubbles around external TM on right    Mouth/Throat:     Tongue: Lesions present.     Comments: White patch on tongue with hyperpigmented coating in the middle of the patch        02/08/2024    2:08 PM 01/25/2023    3:09 PM 01/20/2023    2:27 PM 12/17/2022    9:03 AM 11/16/2022    2:21 PM  Depression screen PHQ 2/9  Decreased Interest 1 1 1 1 1   Down, Depressed, Hopeless **Note De-Identified [place] Obfuscation** 0 0 1 0 1  PHQ - 2 Score 1 1 2 1 2   Altered sleeping 1 0 1 1 1   Tired, decreased energy 3 1 1 1 2   Change in appetite 1 0 1 0 2  Feeling bad or failure about yourself  0 0 1 0 0  Trouble concentrating 0 0 1 0 0  Moving slowly or fidgety/restless 0 0 1 0 0  Suicidal thoughts 0 0 1 0 0  PHQ-9 Score 6 2 9 3 7   Difficult doing work/chores Somewhat difficult Not difficult at all Not difficult at all Somewhat difficult Somewhat difficult       02/08/2024    2:09 PM 01/25/2023    3:10 PM 01/20/2023    2:27 PM 12/17/2022    9:03 AM  GAD 7 : Generalized Anxiety Score  Nervous, Anxious, on Edge 0 1 1 1   Control/stop worrying 0 0 1 0  Worry too much - different things 0 0 1 0  Trouble relaxing 0 0 1 1  Restless 0 0 1 0  Easily annoyed or irritable 2 1 1 1   Afraid - awful might happen 0 0 1 0  Total GAD 7 Score 2 2 7 3   Anxiety Difficulty Not difficult at all Not difficult at all Not difficult at all Somewhat difficult       Assessment  & Plan:  Assessment & Plan   Thrush Oral thrush likely related to smoking and inhaler use. Antifungal treatment expected to resolve in seven days. If not resolved, may need bx to further evaluate. - Prescribed swish and swallow antifungal suspension three times daily for seven days. - Advised to use a toothbrush to remove thrush from the tongue. - Encouraged smoking cessation to prevent recurrence. - Instructed to return if no improvement.  Otitis externa of both ears, unspecified chronicity, unspecified type Fluid in ears without infection, likely due to pH imbalance or recent cold. - Prescribed ear drops to dry fluid. Use as needed if symptoms recur. - Advised to return if symptoms worsen, become painful, or discharge occurs. -     Hydrocortisone -Acetic Acid ; Place 3 drops into both ears in the morning and at bedtime for 5 days.  Dispense: 1.5 mL; Refill: 0  Elevated blood pressure reading Slightly elevated blood pressure. No side effects from current medications. Target is 140/90 mmHg. - Advised home blood pressure monitoring, aiming for readings around 140/90 mmHg. - Follow up with primary care provider for hypertension management.  Cigarette nicotine  dependence without complication Assessment & Plan: Discussed cessation and available treatment including nicotine  replacement options, pharmacologic treatment, and/or online resources. Based on our discussion, she does not plan to initiate treatment today. Total time spent on discussion: 3 minutes.     Follow up plan: Return in about 3 weeks (around 02/29/2024) for Chronic illness f/u.  Hadassah SHAUNNA Nett, MD

## 2024-02-09 ENCOUNTER — Ambulatory Visit
Admission: RE | Admit: 2024-02-09 | Discharge: 2024-02-09 | Disposition: A | Discharge status: Home / Self Care | Source: Ambulatory Visit | Attending: Acute Care | Admitting: Acute Care

## 2024-02-09 DIAGNOSIS — Z87891 Personal history of nicotine dependence: Secondary | ICD-10-CM

## 2024-02-09 DIAGNOSIS — F17210 Nicotine dependence, cigarettes, uncomplicated: Secondary | ICD-10-CM

## 2024-02-09 DIAGNOSIS — Z122 Encounter for screening for malignant neoplasm of respiratory organs: Secondary | ICD-10-CM

## 2024-02-10 NOTE — Telephone Encounter (Signed)
**Note De-Identified [place] Obfuscation** Requested medications are due for refill today.  no  Requested medications are on the active medications list.  yes  Last refill. 02/08/2024  Future visit scheduled.   yes  Notes to clinic.  Pharmacy comment: Please clarify the directions  for this prescription. Not enough qty for 7 day supply. Does the qty need to be increased to ?     Requested Prescriptions  Pending Prescriptions Disp Refills   nystatin  (MYCOSTATIN ) 100000 UNIT/ML suspension [Pharmacy Med Name: NYSTATIN  100000     SUS] 60 mL 0    Sig: TAKE 5 ML BY MOUTH  4 TIMES DAILY FOR 7 DAYS .SWISH  AND  SWALLOW     Off-Protocol Failed - 02/10/2024  3:06 PM      Failed - Medication not assigned to a protocol, review manually.      Passed - Valid encounter within last 12 months    Recent Outpatient Visits           2 days ago Celestino Davene Salome Montell Keri Deidre Herold Hadassah SQUIBB, MD

## 2024-02-10 NOTE — Telephone Encounter (Signed)
**Note De-Identified [place] Obfuscation** Spoke with the pharmacy and patient has already picked medication up.

## 2024-02-11 ENCOUNTER — Other Ambulatory Visit: Payer: Self-pay | Admitting: Nurse Practitioner

## 2024-02-11 DIAGNOSIS — B370 Candidal stomatitis: Secondary | ICD-10-CM

## 2024-02-11 MED ORDER — NYSTATIN 100000 UNIT/ML MT SUSP: 5.0000 mL | Freq: Four times a day (QID) | OROMUCOSAL | 0 refills | Status: DC | PRN

## 2024-02-20 ENCOUNTER — Encounter: Payer: Self-pay | Admitting: Pediatrics

## 2024-02-20 NOTE — Assessment & Plan Note (Signed)
 Discussed cessation and available treatment including nicotine replacement options, pharmacologic treatment, and/or online resources. Based on our discussion, she does not plan to initiate treatment today. Total time spent on discussion: 3 minutes.

## 2024-02-21 ENCOUNTER — Other Ambulatory Visit: Payer: Self-pay | Admitting: Acute Care

## 2024-02-21 DIAGNOSIS — F17210 Nicotine dependence, cigarettes, uncomplicated: Secondary | ICD-10-CM

## 2024-02-21 DIAGNOSIS — Z122 Encounter for screening for malignant neoplasm of respiratory organs: Secondary | ICD-10-CM

## 2024-02-21 DIAGNOSIS — Z87891 Personal history of nicotine dependence: Secondary | ICD-10-CM

## 2024-02-28 NOTE — Patient Instructions (Incomplete)

## 2024-03-03 ENCOUNTER — Ambulatory Visit: Admitting: Nurse Practitioner

## 2024-03-03 DIAGNOSIS — F321 Major depressive disorder, single episode, moderate: Secondary | ICD-10-CM

## 2024-03-03 DIAGNOSIS — J432 Centrilobular emphysema: Secondary | ICD-10-CM

## 2024-03-03 DIAGNOSIS — M85852 Other specified disorders of bone density and structure, left thigh: Secondary | ICD-10-CM

## 2024-03-03 DIAGNOSIS — F5101 Primary insomnia: Secondary | ICD-10-CM

## 2024-03-03 DIAGNOSIS — I700 Atherosclerosis of aorta: Secondary | ICD-10-CM

## 2024-03-03 DIAGNOSIS — F411 Generalized anxiety disorder: Secondary | ICD-10-CM

## 2024-03-03 DIAGNOSIS — E782 Mixed hyperlipidemia: Secondary | ICD-10-CM

## 2024-03-07 ENCOUNTER — Ambulatory Visit: Payer: Self-pay

## 2024-03-07 ENCOUNTER — Ambulatory Visit (INDEPENDENT_AMBULATORY_CARE_PROVIDER_SITE_OTHER): Payer: PRIVATE HEALTH INSURANCE | Admitting: Nurse Practitioner

## 2024-03-07 ENCOUNTER — Encounter: Payer: Self-pay | Admitting: Nurse Practitioner

## 2024-03-07 VITALS — BP 130/76 | HR 73 | Temp 98.2°F | Ht 60.2 in | Wt 79.6 lb

## 2024-03-07 DIAGNOSIS — R634 Abnormal weight loss: Secondary | ICD-10-CM | POA: Insufficient documentation

## 2024-03-07 DIAGNOSIS — K5904 Chronic idiopathic constipation: Secondary | ICD-10-CM | POA: Diagnosis not present

## 2024-03-07 DIAGNOSIS — F411 Generalized anxiety disorder: Secondary | ICD-10-CM | POA: Diagnosis not present

## 2024-03-07 DIAGNOSIS — K5900 Constipation, unspecified: Secondary | ICD-10-CM | POA: Insufficient documentation

## 2024-03-07 DIAGNOSIS — R109 Unspecified abdominal pain: Secondary | ICD-10-CM | POA: Diagnosis not present

## 2024-03-07 DIAGNOSIS — G8929 Other chronic pain: Secondary | ICD-10-CM

## 2024-03-07 DIAGNOSIS — F321 Major depressive disorder, single episode, moderate: Secondary | ICD-10-CM | POA: Diagnosis not present

## 2024-03-07 DIAGNOSIS — B370 Candidal stomatitis: Secondary | ICD-10-CM | POA: Insufficient documentation

## 2024-03-07 LAB — MICROSCOPIC EXAMINATION
Bacteria, UA: NONE SEEN
WBC, UA: NONE SEEN /HPF (ref 0–5)

## 2024-03-07 LAB — WET PREP FOR TRICH, YEAST, CLUE
Clue Cell Exam: NEGATIVE
Trichomonas Exam: NEGATIVE
Yeast Exam: NEGATIVE

## 2024-03-07 LAB — URINALYSIS, ROUTINE W REFLEX MICROSCOPIC
Bilirubin, UA: NEGATIVE
Glucose, UA: NEGATIVE
Ketones, UA: NEGATIVE
Leukocytes,UA: NEGATIVE
Nitrite, UA: NEGATIVE
Protein,UA: NEGATIVE
Specific Gravity, UA: 1.01 (ref 1.005–1.030)
Urobilinogen, Ur: 0.2 mg/dL (ref 0.2–1.0)
pH, UA: 7 (ref 5.0–7.5)

## 2024-03-07 MED ORDER — SERTRALINE HCL 25 MG PO TABS: 25.0000 mg | ORAL_TABLET | Freq: Every day | ORAL | 3 refills | Status: AC

## 2024-03-07 MED ORDER — NYSTATIN 100000 UNIT/ML MT SUSP: 5.0000 mL | Freq: Four times a day (QID) | OROMUCOSAL | 0 refills | Status: AC | PRN

## 2024-03-07 MED ORDER — FLUCONAZOLE 150 MG PO TABS: 150.0000 mg | ORAL_TABLET | Freq: Every day | ORAL | 0 refills | Status: AC

## 2024-03-07 NOTE — Assessment & Plan Note (Signed)
**Note De-Identified Rake Obfuscation** Ongoing issue, will restart Nystatin  and send in one dose of Diflucan .  Monitor closely.

## 2024-03-07 NOTE — Assessment & Plan Note (Signed)
**Note De-Identified Sessums Obfuscation** Chronic, exacerbated.  Denies SI/HI.  Will restart Zoloft  at 25 MG daily, since she has been off of this for 4 months.  Recommend she continue this daily and not stop taking without discussion with provider.

## 2024-03-07 NOTE — Assessment & Plan Note (Signed)
**Note De-Identified Mino Obfuscation** Chronic, ongoing for years. Recommend GI visit, but she refuses at this time. Start Senna daily, discussed with her as she prefers more natural supplement.  Ensure increased water  intake and fiber.

## 2024-03-07 NOTE — Assessment & Plan Note (Signed)
 Refer to depression plan of care.

## 2024-03-07 NOTE — Telephone Encounter (Signed)
**Note De-Identified Venditto Obfuscation** FYI Only or Action Required?: FYI only for provider.  Patient was last seen in primary care on 02/08/2024 by Herold Hadassah SQUIBB, MD.  Called Nurse Triage reporting Mouth Lesions.  Symptoms began ongoing and worsening x couple weeks.  Interventions attempted: Nothing.  Symptoms are: gradually worsening.  Triage Disposition: See Physician Within 24 Hours  Patient/caregiver understands and will follow disposition?: Yes  Copied from CRM [id number]. Topic: Clinical - Red Word Triage >> Mar 07, 2024 12:45 PM Turkey B wrote: [facility] that prompted transfer to Nurse Triage: patient still has yeast in mouth not healing, Patient says it affects her bowels, where she has severe stabbing pain that comes and goes and constipation Reason for Disposition  [1] White patches that stick to tongue or inner cheek AND [2] can be wiped off  [1] MODERATE pain (e.g., interferes with normal activities) AND [2] pain comes and goes (cramps) AND [3] present > 24 hours  (Exception: Pain with Vomiting or Diarrhea - see that Guideline.)  Answer Assessment - Initial Assessment Questions 1. SYMPTOM: What's the main symptom you're concerned about? (e.g., chapped lips, dry mouth, lump, sores)     White patches, tender  2. ONSET: When did the    start?     Ongoing for 3 to 4 weeks and worsening 3. PAIN: Is there any pain? If Yes, ask: How bad is it? (Scale: 0-10; or none, mild, moderate, severe)     tender 4. CAUSE: What do you think is causing the symptoms?     yeast 5. OTHER SYMPTOMS: Do you have any other symptoms? (e.g., fever, sore throat, toothache, swelling)     Sore throat 6. PREGNANCY: Is there any chance you are pregnant? When was your last menstrual period?  Na  Feels like cotton in mouth: had rx: completed rx and still there and worsening/  Answer Assessment - Initial Assessment Questions 1. LOCATION: Where does it hurt?      Abd various from belly button, low left abd 2. RADIATION:  Does the pain shoot anywhere else? (e.g., chest, back)     Yes  3. ONSET: When did the pain begin? (e.g., minutes, hours or days ago)      Ongoing for years 4. SUDDEN: Gradual or sudden onset?     gradual 5. PATTERN Does the pain come and go, or is it constant?     Comes and goes 6. SEVERITY: How bad is the pain?  (e.g., Scale 1-10; mild, moderate, or severe)  7/10 7. RECURRENT SYMPTOM: Have you ever had this type of stomach pain before? If Yes, ask: When was the last time? and What happened that time?      yes 8. CAUSE: What do you think is causing the stomach pain? (e.g., gallstones, recent abdominal surgery)     unknown 9. RELIEVING/AGGRAVATING FACTORS: What makes it better or worse? (e.g., antacids, bending or twisting motion, bowel movement)     Bloating, gasy 10. OTHER SYMPTOMS: Do you have any other symptoms? (e.g., back pain, diarrhea, fever, urination pain, vomiting)       Pt  has hx of diverticulosis. 11. PREGNANCY: Is there any chance you are pregnant? When was your last menstrual period?       na  Protocols used: Mouth Symptoms-A-AH, Abdominal Pain - Female-A-AH

## 2024-03-07 NOTE — Patient Instructions (Addendum)
**Note De-Identified Bennion Obfuscation** Start taking Senna daily  Oral Thrush, Adult Oral thrush is an infection in your mouth and throat and on your tongue. It causes white patches to form in your mouth and on your tongue. Many cases of thrush are mild. But, sometimes, thrush can be serious. People who have a weak body defense system (immune system) or other diseases can be affected more. What are the causes? This condition is caused by a type of fungus called yeast. The fungus is normally present in small amounts in the mouth and nose. If a person has a long-term illness or a weak body defense system, the fungus can grow and spread quickly. This causes thrush. What increases the risk? You are more likely to develop this condition if: You have a weak body defense system. You are an older adult. You have diabetes, cancer, or HIV. You have a dry mouth. You are pregnant or breastfeeding. You do not take good care of your teeth. This risk is greater for people who have false teeth (dentures). You use antibiotic or steroid medicines. What are the signs or symptoms? Symptoms of this condition include: A burning feeling in the mouth and throat. White patches that stick to the mouth and tongue. A bad taste in the mouth or trouble tasting foods. A feeling like you have cotton in your mouth. Pain when you eat and swallow. Not wanting to eat as much as usual. Cracking at the corners of the mouth. How is this treated? This condition is treated with medicines called antifungals. These medicines prevent a fungus from growing. The medicines are either put right on the area (topical) or swallowed (oral). Your doctor will also treat other problems that you may have, such as diabetes or HIV. Follow these instructions at home: Helping with pain and soreness To lessen your pain: Drink cold liquids, like water  and iced tea. Eat frozen ice pops or frozen juices. Eat foods that are easy to swallow, like gelatin and ice cream. Drink from a  straw if you have too much pain in your mouth.  General instructions Take or use over-the-counter and prescription medicines only as told by your doctor. Eat plain yogurt that has live cultures in it. Read the label to make sure that there are live cultures in your yogurt. If you wear false teeth: Take them out before you go to bed. Brush them well. Soak them in a cleaner. Rinse your mouth with warm salt-water  many times a day. To make the salt-water  mixture, dissolve -1 teaspoon (3-6 g) of salt in 1 cup (237 mL) of warm water . Contact a doctor if: Your problems do not get better within 7 days of treatment. Your infection is spreading. This may show as white areas on the skin outside of your mouth. You are nursing your baby and you have redness and pain in the nipples. Summary Oral thrush is an infection in your mouth and throat. It is caused by a fungus. You are more likely to get this condition if you have a weak body defense system. Diseases like diabetes, cancer, or HIV also add to your risk. This condition is treated with medicines called antifungals. Contact a doctor if you do not get better within 7 days of starting treatment. This information is not intended to replace advice given to you by your health care provider. Make sure you discuss any questions you have with your health care provider. Document Revised: 07/06/2022 Document Reviewed: 07/06/2022 Elsevier Patient Education  2024 [employer].

## 2024-03-07 NOTE — Assessment & Plan Note (Signed)
**Note De-Identified Coop Obfuscation** Has lost 11 lbs since March, suspect multifactorial in nature. Anxiety, thrush, unable to use dentures, constipation.  Refer to plans for these.  Labs today and monitor weight closely. If ongoing loss at next visit will obtain imaging, she refuses today.

## 2024-03-07 NOTE — Assessment & Plan Note (Addendum)
**Note De-Identified Mcintire Obfuscation** Intermittent, suspect much if current pain related to anxiety and constipation.  Will check labs today: CBC, CMP, TSH.  Restart Zoloft  for anxiety.  Concern with her weight loss (11 lbs since March), but suspect some of this related to her current anxiety and thrush. Recommend she drink protein supplement 2-3 times daily. If ongoing weight loss or pain will obtain imaging of abdomen. Discussed plan at length with her + discussed concerns about her weight loss.  Concern with her current anxiety she is moving into an anorexic state, which discussed with her and made her aware if ongoing may need hospitalization. - For constipation recommend she start taking Senna daily, as she prefers more natural supplement + increase fluid intake.  Goal is daily BM without straining. - Refuses GI referral at this time. - UA and wet prep overall reassuring today.

## 2024-03-07 NOTE — Progress Notes (Signed)
**Note De-Identified Pillay Obfuscation** BP 130/76 (BP Location: Left Arm)   Pulse 73   Temp 98.2 F (36.8 C) (Oral)   Ht 5' 0.2 (1.529 m)   Wt 79 lb 9.6 oz (36.1 kg)   SpO2 98%   BMI 15.44 kg/m    Subjective:    Patient ID: Haley [name], female    DOB: 02-Jun-1961, 63 y.o.   MRN: [id number]  HPI: Haley [name] is a 63 y.o. female  Chief Complaint  Patient presents with   Abdominal Pain    Patient states she has been having lower left abdominal pain for the last few years. States she feels a pulling and stretching sensation in the area. States she also feels a main in the middle of her abdomen when she tries to eat. States she feels nauseous as well.    Thrush   Self stopped all medications 4 months ago.  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 On review has lost 11 lbs since March, she feels this is related to her anxiety at present and the thrush.  Has thrush, so cannot wear dentures, which affects appetite due to discomfort.  Is taking 3 probiotic a day, started two days ago. Her husband and her are trying to be more holistic, taking various supplements - magnesium , potassium, B12, Vitamin D .  Last treated for diverticulitis on 10/24/21.  Has not been taking any medications, including Zoloft  or Hydroxyzine .  Endorses being obsessed with food, feels like every time she eats she will feel bad and this scares her -- states she may need to go back onto anxiety medication due to being hyper focused on this and increased stress.  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    History: Significant history of diverticulitis and abscess issues.  Has history of surgery for this in February 2019. Then in March 2019 had laparoscopic surgery due to abscess. Had colonoscopy with Dr. Dessa in 2017 noting diverticulosis of sigmoid colon, she refuses to see anyone else for these and he has retired. Duration: chronic Onset: gradual Severity: 6/10 varies Quality: dull, aching, and  occasional pulling sensation Location:  LLQ  Episode duration: sometimes a few hours and sometimes a few days Radiation: no Frequency: intermittent Alleviating factors: just goes away after bowel movement Aggravating factors: unknown Status: stable Treatments attempted: supplements and monitoring diet Fever: no Nausea: yes Vomiting: yes only a couple times with anxiety Weight loss: no Decreased appetite: no refer to above Diarrhea: no Constipation: yes -- has a BM every 2-3 days. Does strain when she has these. Tries to push water  and eat prunes, no medications for this. Blood in stool: no Heartburn: occasional Jaundice: no Rash: no Dysuria/urinary frequency: no Hematuria: no History of sexually transmitted disease: no Recurrent NSAID use: no     03/07/2024    4:08 PM 02/08/2024    2:08 PM 01/25/2023    3:09 PM 01/20/2023    2:27 PM 12/17/2022    9:03 AM  Depression screen PHQ 2/9  Decreased Interest 0 1 1 1 1   Down, Depressed, Hopeless 0 0 0 1 0  PHQ - 2 Score 0 1 1 2 1   Altered sleeping 3 1 0 1 1  Tired, decreased energy 3 3 1 1 1   Change in appetite 3 1 0 1 0  Feeling bad or failure about yourself  0 0 0 1 0  Trouble concentrating 0 0 0 1 0 **Note De-Identified Hershman Obfuscation** Moving slowly or fidgety/restless 0 0 0 1 0  Suicidal thoughts 0 0 0 1 0  PHQ-9 Score 9 6 2 9 3   Difficult doing work/chores Somewhat difficult Somewhat difficult Not difficult at all Not difficult at all Somewhat difficult       03/07/2024    4:08 PM 02/08/2024    2:09 PM 01/25/2023    3:10 PM 01/20/2023    2:27 PM  GAD 7 : Generalized Anxiety Score  Nervous, Anxious, on Edge 3 0 1 1  Control/stop worrying 0 0 0 1  Worry too much - different things 0 0 0 1  Trouble relaxing 0 0 0 1  Restless 0 0 0 1  Easily annoyed or irritable 3 2 1 1   Afraid - awful might happen 0 0 0 1  Total GAD 7 Score 6 2 2 7   Anxiety Difficulty Somewhat difficult Not difficult at all Not difficult at all Not difficult at all   Relevant past medical,  surgical, family and social history reviewed and updated as indicated. Interim medical history since our last visit reviewed. Allergies and medications reviewed and updated.  Review of Systems  Constitutional:  Positive for fatigue. Negative for activity change, appetite change, diaphoresis and fever.  Respiratory:  Negative for cough, chest tightness and shortness of breath.   Cardiovascular:  Negative for chest pain, palpitations and leg swelling.  Gastrointestinal:  Positive for abdominal pain, constipation, nausea and vomiting (only a couple times due to anxiety). Negative for abdominal distention and diarrhea.  Neurological: Negative.   Psychiatric/Behavioral:  Negative for decreased concentration, self-injury, sleep disturbance and suicidal ideas. The patient is nervous/anxious.     Per HPI unless specifically indicated above     Objective:    BP 130/76 (BP Location: Left Arm)   Pulse 73   Temp 98.2 F (36.8 C) (Oral)   Ht 5' 0.2 (1.529 m)   Wt 79 lb 9.6 oz (36.1 kg)   SpO2 98%   BMI 15.44 kg/m   Wt Readings from Last 3 Encounters:  03/07/24 79 lb 9.6 oz (36.1 kg)  02/08/24 80 lb 3.2 oz (36.4 kg)  10/27/23 90 lb (40.8 kg)    Physical Exam Vitals and nursing note reviewed.  Constitutional:      General: She is awake. She is not in acute distress.    Appearance: She is well-developed, well-groomed and underweight. She is not ill-appearing or toxic-appearing.  HENT:     Head: Normocephalic.     Right Ear: Hearing, tympanic membrane, ear canal and external ear normal.     Left Ear: Hearing, tympanic membrane, ear canal and external ear normal.     Nose: Nose normal.     Right Sinus: No maxillary sinus tenderness or frontal sinus tenderness.     Left Sinus: No maxillary sinus tenderness or frontal sinus tenderness.     Mouth/Throat:     Mouth: Mucous membranes are moist.     Comments: Edentulous at present, no dentures in. Tongue with mild erythema and white  covering. Eyes:     General: Lids are normal.        Right eye: No discharge.        Left eye: No discharge.     Conjunctiva/sclera: Conjunctivae normal.     Pupils: Pupils are equal, round, and reactive to light.  Neck:     Thyroid: No thyromegaly.     Vascular: No carotid bruit.  Cardiovascular:     Rate and Rhythm: **Note De-Identified Hubers Obfuscation** Normal rate and regular rhythm.     Heart sounds: Normal heart sounds. No murmur heard.    No gallop.  Pulmonary:     Effort: Pulmonary effort is normal. No accessory muscle usage or respiratory distress.     Breath sounds: Normal breath sounds.  Abdominal:     General: Bowel sounds are normal. There is no distension.     Palpations: Abdomen is soft.     Tenderness: There is no abdominal tenderness. There is no right CVA tenderness or left CVA tenderness.  Musculoskeletal:     Cervical back: Normal range of motion and neck supple.     Right lower leg: No edema.     Left lower leg: No edema.  Lymphadenopathy:     Cervical: No cervical adenopathy.  Skin:    General: Skin is warm and dry.  Neurological:     Mental Status: She is alert and oriented to person, place, and time.     Deep Tendon Reflexes: Reflexes are normal and symmetric.     Reflex Scores:      Brachioradialis reflexes are 2+ on the right side and 2+ on the left side.      Patellar reflexes are 2+ on the right side and 2+ on the left side. Psychiatric:        Attention and Perception: Attention normal.        Mood and Affect: Mood normal. Affect is tearful.        Speech: Speech normal.        Behavior: Behavior normal. Behavior is cooperative.        Thought Content: Thought content normal.    Results for orders placed or performed in visit on 03/07/24  WET PREP FOR TRICH, YEAST, CLUE   Collection Time: 03/07/24  4:07 PM   Specimen: Urine   Urine  Result Value Ref Range   Trichomonas Exam Negative Negative   Yeast Exam Negative Negative   Clue Cell Exam Negative Negative  Microscopic  Examination   Collection Time: 03/07/24  4:07 PM   Urine  Result Value Ref Range   WBC, UA None seen 0 - 5 /hpf   RBC, Urine 0-2 0 - 2 /hpf   Epithelial Cells (non renal) 0-10 0 - 10 /hpf   Bacteria, UA None seen None seen/Few  Urinalysis, Routine w reflex microscopic   Collection Time: 03/07/24  4:07 PM  Result Value Ref Range   Specific Gravity, UA 1.010 1.005 - 1.030   pH, UA 7.0 5.0 - 7.5   Color, UA Yellow Yellow   Appearance Ur Clear Clear   Leukocytes,UA Negative Negative   Protein,UA Negative Negative/Trace   Glucose, UA Negative Negative   Ketones, UA Negative Negative   RBC, UA Trace (A) Negative   Bilirubin, UA Negative Negative   Urobilinogen, Ur 0.2 0.2 - 1.0 mg/dL   Nitrite, UA Negative Negative   Microscopic Examination See below:       Assessment & Plan:   Problem List Items Addressed This Visit       Digestive   Thrush, oral   Ongoing issue, will restart Nystatin  and send in one dose of Diflucan .  Monitor closely.      Relevant Medications   nystatin  (MYCOSTATIN ) 100000 UNIT/ML suspension   fluconazole  (DIFLUCAN ) 150 MG tablet     Other   Weight loss, abnormal   Has lost 11 lbs since March, suspect multifactorial in nature. Anxiety, thrush, unable to use dentures, constipation. **Note De-Identified Baltes Obfuscation** Refer to plans for these.  Labs today and monitor weight closely. If ongoing loss at next visit will obtain imaging, she refuses today.      Generalized anxiety disorder   Refer to depression plan of care.      Relevant Medications   sertraline  (ZOLOFT ) 25 MG tablet   Depression, major, single episode, moderate (HCC) - Primary   Chronic, exacerbated.  Denies SI/HI.  Will restart Zoloft  at 25 MG daily, since she has been off of this for 4 months.  Recommend she continue this daily and not stop taking without discussion with provider.      Relevant Medications   sertraline  (ZOLOFT ) 25 MG tablet   Constipation   Chronic, ongoing for years. Recommend GI visit, but she  refuses at this time. Start Senna daily, discussed with her as she prefers more natural supplement.  Ensure increased water  intake and fiber.      Chronic abdominal pain   Intermittent, suspect much if current pain related to anxiety and constipation.  Will check labs today: CBC, CMP, TSH.  Restart Zoloft  for anxiety.  Concern with her weight loss (11 lbs since March), but suspect some of this related to her current anxiety and thrush. Recommend she drink protein supplement 2-3 times daily. If ongoing weight loss or pain will obtain imaging of abdomen. Discussed plan at length with her + discussed concerns about her weight loss.  Concern with her current anxiety she is moving into an anorexic state, which discussed with her and made her aware if ongoing may need hospitalization. - For constipation recommend she start taking Senna daily, as she prefers more natural supplement + increase fluid intake.  Goal is daily BM without straining. - Refuses GI referral at this time. - UA and wet prep overall reassuring today.      Relevant Medications   sertraline  (ZOLOFT ) 25 MG tablet   Other Relevant Orders   Urinalysis, Routine w reflex microscopic (Completed)   WET PREP FOR TRICH, YEAST, CLUE (Completed)   Microscopic Examination (Completed)   CBC with Differential/Platelet   Comprehensive metabolic panel with GFR   TSH    Time: 25 minutes, >50% spent counseling/or care coordination  Follow up plan: Return in about 4 weeks (around 04/04/2024) for Depression, ANXIETY and weight check -- restarted Zoloft .

## 2024-03-08 ENCOUNTER — Ambulatory Visit: Payer: Self-pay | Admitting: Nurse Practitioner

## 2024-03-08 LAB — COMPREHENSIVE METABOLIC PANEL WITH GFR
ALT: 14 IU/L (ref 0–32)
AST: 23 IU/L (ref 0–40)
Albumin: 4.7 g/dL (ref 3.9–4.9)
Alkaline Phosphatase: 108 IU/L (ref 44–121)
BUN/Creatinine Ratio: 13 (ref 12–28)
BUN: 8 mg/dL (ref 8–27)
Bilirubin Total: 0.5 mg/dL (ref 0.0–1.2)
CO2: 19 mmol/L — ABNORMAL LOW (ref 20–29)
Calcium: 9.8 mg/dL (ref 8.7–10.3)
Chloride: 103 mmol/L (ref 96–106)
Creatinine, Ser: 0.62 mg/dL (ref 0.57–1.00)
Globulin, Total: 2.7 g/dL (ref 1.5–4.5)
Glucose: 87 mg/dL (ref 70–99)
Potassium: 3.8 mmol/L (ref 3.5–5.2)
Sodium: 139 mmol/L (ref 134–144)
Total Protein: 7.4 g/dL (ref 6.0–8.5)
eGFR: 99 mL/min/1.73 (ref >59)

## 2024-03-08 LAB — CBC WITH DIFFERENTIAL/PLATELET
Basophils Absolute: 0.1 x10E3/uL (ref 0.0–0.2)
Basos: 1 %
EOS (ABSOLUTE): 0 x10E3/uL (ref 0.0–0.4)
Eos: 0 %
Hematocrit: 47.8 % — ABNORMAL HIGH (ref 34.0–46.6)
Hemoglobin: 15.7 g/dL (ref 11.1–15.9)
Immature Grans (Abs): 0 x10E3/uL (ref 0.0–0.1)
Immature Granulocytes: 0 %
Lymphocytes Absolute: 2.4 x10E3/uL (ref 0.7–3.1)
Lymphs: 26 %
MCH: 32 pg (ref 26.6–33.0)
MCHC: 32.8 g/dL (ref 31.5–35.7)
MCV: 97 fL (ref 79–97)
Monocytes Absolute: 0.3 x10E3/uL (ref 0.1–0.9)
Monocytes: 4 %
Neutrophils Absolute: 6.2 x10E3/uL (ref 1.4–7.0)
Neutrophils: 69 %
Platelets: 307 x10E3/uL (ref 150–450)
RBC: 4.91 x10E6/uL (ref 3.77–5.28)
RDW: 13.1 % (ref 11.7–15.4)
WBC: 8.9 x10E3/uL (ref 3.4–10.8)

## 2024-03-08 LAB — TSH: TSH: 2.68 u[IU]/mL (ref 0.450–4.500)

## 2024-03-08 NOTE — Progress Notes (Signed)
**Note De-Identified Moodie Obfuscation** Contacted [facility]  Good morning Alyvia, your labs have returned and overall these are stable.  A couple abnormal levels noted, but these are very mild and could be related to blood sample itself.  No infection concern on labs.  Any questions? Keep being stellar!!  Thank you for allowing me to participate in your care.  I appreciate you. Kindest regards, Lakely Elmendorf

## 2024-03-28 ENCOUNTER — Ambulatory Visit: Payer: PRIVATE HEALTH INSURANCE | Admitting: Pediatrics

## 2024-04-09 NOTE — Patient Instructions (Incomplete)
 Be Involved in Caring For Your Health:  Taking Medications When medications are taken as directed, they can greatly improve your health. But if they are not taken as prescribed, they may not work. In some cases, not taking them correctly can be harmful. To help ensure your treatment remains effective and safe, understand your medications and how to take them. Bring your medications to each visit for review by your provider.  Your lab results, notes, and after visit summary will be available on My Chart. We strongly encourage you to use this feature. If lab results are abnormal the clinic will contact you with the appropriate steps. If the clinic does not contact you assume the results are satisfactory. You can always view your results on My Chart. If you have questions regarding your health or results, please contact the clinic during office hours. You can also ask questions on My Chart.  We at [facility] are grateful that you chose us  to provide your care. We strive to provide evidence-based and compassionate care and are always looking for feedback. If you get a survey from the clinic please complete this so we can hear your opinions.  Healthy Eating, Adult Healthy eating may help you get and keep a healthy body weight, reduce the risk of chronic disease, and live a long and productive life. It is important to follow a healthy eating pattern. Your nutritional and calorie needs should be met mainly by different nutrient-rich foods. What are tips for following this plan? Reading food labels Read labels and choose the following: Reduced or low sodium products. Juices with 100% fruit juice. Foods with low saturated fats (<3 g per serving) and high polyunsaturated and monounsaturated fats. Foods with whole grains, such as whole wheat, cracked wheat, brown rice, and wild rice. Whole grains that are fortified with folic acid. This is recommended for females who are pregnant or who want to  become pregnant. Read labels and do not eat or drink the following: Foods or drinks with added sugars. These include foods that contain brown sugar, corn sweetener, corn syrup, dextrose , fructose, glucose, high-fructose corn syrup, honey, invert sugar, lactose, malt syrup, maltose, molasses, raw sugar, sucrose, trehalose, or turbinado sugar. Limit your intake of added sugars to less than 10% of your total daily calories. Do not eat more than the following amounts of added sugar per day: 6 teaspoons (25 g) for females. 9 teaspoons (38 g) for males. Foods that contain processed or refined starches and grains. Refined grain products, such as white flour, degermed cornmeal, white bread, and white rice. Shopping Choose nutrient-rich snacks, such as vegetables, whole fruits, and nuts. Avoid high-calorie and high-sugar snacks, such as potato chips, fruit snacks, and candy. Use oil-based dressings and spreads on foods instead of solid fats such as butter, margarine, sour cream, or cream cheese. Limit pre-made sauces, mixes, and instant products such as flavored rice, instant noodles, and ready-made pasta. Try more plant-protein sources, such as tofu, tempeh, black beans, edamame, lentils, nuts, and seeds. Explore eating plans such as the Mediterranean diet or vegetarian diet. Try heart-healthy dips made with beans and healthy fats like hummus and guacamole. Vegetables go great with these. Cooking Use oil to saut or stir-fry foods instead of solid fats such as butter, margarine, or lard. Try baking, boiling, grilling, or broiling instead of frying. Remove the fatty part of meats before cooking. Steam vegetables in water  or broth. Meal planning  At meals, imagine dividing your plate into fourths: One-half of  your plate is fruits and vegetables. One-fourth of your plate is whole grains. One-fourth of your plate is protein, especially lean meats, poultry, eggs, tofu, beans, or nuts. Include low-fat  dairy as part of your daily diet. Lifestyle Choose healthy options in all settings, including home, work, school, restaurants, or stores. Prepare your food safely: Wash your hands after handling raw meats. Where you prepare food, keep surfaces clean by regularly washing with hot, soapy water . Keep raw meats separate from ready-to-eat foods, such as fruits and vegetables. Cook seafood, meat, poultry, and eggs to the recommended temperature. Get a food thermometer. Store foods at safe temperatures. In general: Keep cold foods at 84F (4.4C) or below. Keep hot foods at 184F (60C) or above. Keep your freezer at [facility] (-17.8C) or below. Foods are not safe to eat if they have been between the temperatures of 40-184F (4.4-60C) for more than 2 hours. What foods should I eat? Fruits Aim to eat 1-2 cups of fresh, canned (in natural juice), or frozen fruits each day. One cup of fruit equals 1 small apple, 1 large banana, 8 large strawberries, 1 cup (237 g) canned fruit,  cup (82 g) dried fruit, or 1 cup (240 mL) 100% juice. Vegetables Aim to eat 2-4 cups of fresh and frozen vegetables each day, including different varieties and colors. One cup of vegetables equals 1 cup (91 g) broccoli or cauliflower florets, 2 medium carrots, 2 cups (150 g) raw, leafy greens, 1 large tomato, 1 large bell pepper, 1 large sweet potato, or 1 medium white potato. Grains Aim to eat 5-10 ounce-equivalents of whole grains each day. Examples of 1 ounce-equivalent of grains include 1 slice of bread, 1 cup (40 g) ready-to-eat cereal, 3 cups (24 g) popcorn, or  cup (93 g) cooked rice. Meats and other proteins Try to eat 5-7 ounce-equivalents of protein each day. Examples of 1 ounce-equivalent of protein include 1 egg,  oz nuts (12 almonds, 24 pistachios, or 7 walnut halves), 1/4 cup (90 g) cooked beans, 6 tablespoons (90 g) hummus or 1 tablespoon (16 g) peanut butter. A cut of meat or fish that is the size of a deck of  cards is about 3-4 ounce-equivalents (85 g). Of the protein you eat each week, try to have at least 8 sounce (227 g) of seafood. This is about 2 servings per week. This includes salmon, trout, herring, sardines, and anchovies. Dairy Aim to eat 3 cup-equivalents of fat-free or low-fat dairy each day. Examples of 1 cup-equivalent of dairy include 1 cup (240 mL) milk, 8 ounces (250 g) yogurt, 1 ounces (44 g) natural cheese, or 1 cup (240 mL) fortified soy milk. Fats and oils Aim for about 5 teaspoons (21 g) of fats and oils per day. Choose monounsaturated fats, such as canola and olive oils, mayonnaise made with olive oil or avocado oil, avocados, peanut butter, and most nuts, or polyunsaturated fats, such as sunflower, corn, and soybean oils, walnuts, pine nuts, sesame seeds, sunflower seeds, and flaxseed. Beverages Aim for 6 eight-ounce glasses of water  per day. Limit coffee to 3-5 eight-ounce cups per day. Limit caffeinated beverages that have added calories, such as soda and energy drinks. If you drink alcohol: Limit how much you have to: 0-1 drink a day if you are female. 0-2 drinks a day if you are female. Know how much alcohol is in your drink. In the [place] one drink is one 12 oz bottle of beer (355 mL), one 5 oz glass of wine (  148 mL), or one 1 oz glass of hard liquor (44 mL). Seasoning and other foods Try not to add too much salt to your food. Try using herbs and spices instead of salt. Try not to add sugar to food. This information is based on [place] nutrition guidelines. To learn more, visit [employer]. Exact amounts may vary. You may need different amounts. This information is not intended to replace advice given to you by your health care provider. Make sure you discuss any questions you have with your health care provider. Document Revised: 04/20/2022 Document Reviewed: 04/20/2022 Elsevier Patient Education  2024 [employer].

## 2024-04-11 ENCOUNTER — Ambulatory Visit: Payer: PRIVATE HEALTH INSURANCE | Admitting: Nurse Practitioner

## 2024-04-11 DIAGNOSIS — R634 Abnormal weight loss: Secondary | ICD-10-CM

## 2024-04-11 DIAGNOSIS — F321 Major depressive disorder, single episode, moderate: Secondary | ICD-10-CM

## 2024-04-11 DIAGNOSIS — F411 Generalized anxiety disorder: Secondary | ICD-10-CM

## 2024-04-11 DIAGNOSIS — E559 Vitamin D deficiency, unspecified: Secondary | ICD-10-CM

## 2024-04-11 DIAGNOSIS — I700 Atherosclerosis of aorta: Secondary | ICD-10-CM

## 2024-04-11 DIAGNOSIS — J432 Centrilobular emphysema: Secondary | ICD-10-CM

## 2024-04-11 DIAGNOSIS — F17210 Nicotine dependence, cigarettes, uncomplicated: Secondary | ICD-10-CM

## 2024-04-11 DIAGNOSIS — E782 Mixed hyperlipidemia: Secondary | ICD-10-CM

## 2024-04-11 DIAGNOSIS — G8929 Other chronic pain: Secondary | ICD-10-CM
